# Patient Record
Sex: Female | Born: 1965
Health system: Southern US, Community
[De-identification: ages and names within clinical notes are randomized; demographics above are authoritative.]

## PROBLEM LIST (undated history)

## (undated) DIAGNOSIS — J329 Chronic sinusitis, unspecified: Secondary | ICD-10-CM

## (undated) DIAGNOSIS — R002 Palpitations: Secondary | ICD-10-CM

## (undated) DIAGNOSIS — E041 Nontoxic single thyroid nodule: Secondary | ICD-10-CM

## (undated) DIAGNOSIS — IMO0001 Reserved for inherently not codable concepts without codable children: Secondary | ICD-10-CM

## (undated) DIAGNOSIS — J302 Other seasonal allergic rhinitis: Secondary | ICD-10-CM

## (undated) DIAGNOSIS — K219 Gastro-esophageal reflux disease without esophagitis: Secondary | ICD-10-CM

## (undated) DIAGNOSIS — R519 Headache, unspecified: Secondary | ICD-10-CM

## (undated) DIAGNOSIS — E119 Type 2 diabetes mellitus without complications: Secondary | ICD-10-CM

## (undated) DIAGNOSIS — H548 Legal blindness, as defined in USA: Secondary | ICD-10-CM

## (undated) DIAGNOSIS — R51 Headache: Secondary | ICD-10-CM

## (undated) DIAGNOSIS — H5210 Myopia, unspecified eye: Secondary | ICD-10-CM

## (undated) DIAGNOSIS — M1990 Unspecified osteoarthritis, unspecified site: Secondary | ICD-10-CM

## (undated) DIAGNOSIS — J189 Pneumonia, unspecified organism: Secondary | ICD-10-CM

## (undated) DIAGNOSIS — D649 Anemia, unspecified: Secondary | ICD-10-CM

## (undated) DIAGNOSIS — Z9289 Personal history of other medical treatment: Secondary | ICD-10-CM

## (undated) DIAGNOSIS — E785 Hyperlipidemia, unspecified: Secondary | ICD-10-CM

## (undated) DIAGNOSIS — R011 Cardiac murmur, unspecified: Secondary | ICD-10-CM

## (undated) DIAGNOSIS — I10 Essential (primary) hypertension: Secondary | ICD-10-CM

## (undated) DIAGNOSIS — H409 Unspecified glaucoma: Secondary | ICD-10-CM

## (undated) DIAGNOSIS — J40 Bronchitis, not specified as acute or chronic: Secondary | ICD-10-CM

## (undated) DIAGNOSIS — Z01419 Encounter for gynecological examination (general) (routine) without abnormal findings: Secondary | ICD-10-CM

## (undated) HISTORY — PX: LASIK: SHX215

## (undated) HISTORY — DX: Personal history of other medical treatment: Z92.89

## (undated) HISTORY — DX: Myopia, unspecified eye: H52.10

## (undated) HISTORY — DX: Hyperlipidemia, unspecified: E78.5

## (undated) HISTORY — DX: Unspecified glaucoma: H40.9

## (undated) HISTORY — DX: Gastro-esophageal reflux disease without esophagitis: K21.9

## (undated) HISTORY — DX: Encounter for gynecological examination (general) (routine) without abnormal findings: Z01.419

## (undated) HISTORY — PX: INCISION AND DRAINAGE: SHX5863

## (undated) HISTORY — PX: CYSTECTOMY: SUR359

---

## 1997-11-29 ENCOUNTER — Encounter: Admission: RE | Admit: 1997-11-29 | Discharge: 1997-11-29 | Payer: Self-pay | Admitting: Family Medicine

## 1997-12-20 ENCOUNTER — Encounter: Admission: RE | Admit: 1997-12-20 | Discharge: 1997-12-20 | Payer: Self-pay | Admitting: Family Medicine

## 1998-01-20 ENCOUNTER — Encounter: Admission: RE | Admit: 1998-01-20 | Discharge: 1998-01-20 | Payer: Self-pay | Admitting: Family Medicine

## 1998-03-16 ENCOUNTER — Other Ambulatory Visit: Admission: RE | Admit: 1998-03-16 | Discharge: 1998-03-16 | Payer: Self-pay | Admitting: *Deleted

## 1998-03-16 ENCOUNTER — Encounter: Admission: RE | Admit: 1998-03-16 | Discharge: 1998-03-16 | Payer: Self-pay | Admitting: Family Medicine

## 1998-03-31 ENCOUNTER — Encounter: Admission: RE | Admit: 1998-03-31 | Discharge: 1998-03-31 | Payer: Self-pay | Admitting: Family Medicine

## 1998-04-17 ENCOUNTER — Encounter: Admission: RE | Admit: 1998-04-17 | Discharge: 1998-04-17 | Payer: Self-pay | Admitting: Family Medicine

## 1998-06-30 ENCOUNTER — Encounter: Admission: RE | Admit: 1998-06-30 | Discharge: 1998-06-30 | Payer: Self-pay | Admitting: Family Medicine

## 1998-07-26 ENCOUNTER — Encounter: Admission: RE | Admit: 1998-07-26 | Discharge: 1998-07-26 | Payer: Self-pay | Admitting: Family Medicine

## 1998-07-27 ENCOUNTER — Encounter: Admission: RE | Admit: 1998-07-27 | Discharge: 1998-07-27 | Payer: Self-pay | Admitting: Family Medicine

## 1998-08-11 ENCOUNTER — Encounter: Admission: RE | Admit: 1998-08-11 | Discharge: 1998-08-11 | Payer: Self-pay | Admitting: Family Medicine

## 1998-08-29 ENCOUNTER — Encounter: Admission: RE | Admit: 1998-08-29 | Discharge: 1998-08-29 | Payer: Self-pay | Admitting: Family Medicine

## 1999-03-02 ENCOUNTER — Encounter: Admission: RE | Admit: 1999-03-02 | Discharge: 1999-03-02 | Payer: Self-pay | Admitting: Family Medicine

## 1999-03-08 ENCOUNTER — Encounter: Admission: RE | Admit: 1999-03-08 | Discharge: 1999-03-08 | Payer: Self-pay | Admitting: Family Medicine

## 1999-03-13 ENCOUNTER — Encounter: Admission: RE | Admit: 1999-03-13 | Discharge: 1999-03-13 | Payer: Self-pay | Admitting: Sports Medicine

## 1999-03-20 ENCOUNTER — Encounter: Admission: RE | Admit: 1999-03-20 | Discharge: 1999-03-20 | Payer: Self-pay | Admitting: Family Medicine

## 1999-05-04 ENCOUNTER — Encounter: Admission: RE | Admit: 1999-05-04 | Discharge: 1999-05-04 | Payer: Self-pay | Admitting: Family Medicine

## 1999-06-11 ENCOUNTER — Encounter: Admission: RE | Admit: 1999-06-11 | Discharge: 1999-06-11 | Payer: Self-pay | Admitting: Family Medicine

## 1999-07-31 ENCOUNTER — Other Ambulatory Visit: Admission: RE | Admit: 1999-07-31 | Discharge: 1999-07-31 | Payer: Self-pay | Admitting: *Deleted

## 1999-07-31 ENCOUNTER — Encounter: Admission: RE | Admit: 1999-07-31 | Discharge: 1999-07-31 | Payer: Self-pay | Admitting: Family Medicine

## 1999-08-01 ENCOUNTER — Encounter: Admission: RE | Admit: 1999-08-01 | Discharge: 1999-08-01 | Payer: Self-pay | Admitting: Family Medicine

## 1999-10-25 ENCOUNTER — Encounter: Admission: RE | Admit: 1999-10-25 | Discharge: 1999-10-25 | Payer: Self-pay | Admitting: Family Medicine

## 1999-11-13 ENCOUNTER — Encounter: Admission: RE | Admit: 1999-11-13 | Discharge: 1999-11-13 | Payer: Self-pay | Admitting: Sports Medicine

## 1999-11-30 ENCOUNTER — Encounter: Admission: RE | Admit: 1999-11-30 | Discharge: 1999-11-30 | Payer: Self-pay | Admitting: Family Medicine

## 2000-02-28 ENCOUNTER — Encounter: Admission: RE | Admit: 2000-02-28 | Discharge: 2000-02-28 | Payer: Self-pay | Admitting: Family Medicine

## 2000-11-19 ENCOUNTER — Encounter: Admission: RE | Admit: 2000-11-19 | Discharge: 2000-11-19 | Payer: Self-pay | Admitting: Family Medicine

## 2001-01-16 ENCOUNTER — Encounter: Admission: RE | Admit: 2001-01-16 | Discharge: 2001-01-16 | Payer: Self-pay | Admitting: Family Medicine

## 2001-01-21 ENCOUNTER — Encounter: Admission: RE | Admit: 2001-01-21 | Discharge: 2001-01-21 | Payer: Self-pay | Admitting: Family Medicine

## 2001-01-27 ENCOUNTER — Encounter: Admission: RE | Admit: 2001-01-27 | Discharge: 2001-04-27 | Payer: Self-pay

## 2001-02-23 ENCOUNTER — Encounter: Admission: RE | Admit: 2001-02-23 | Discharge: 2001-02-23 | Payer: Self-pay | Admitting: Family Medicine

## 2001-04-22 ENCOUNTER — Encounter: Admission: RE | Admit: 2001-04-22 | Discharge: 2001-04-22 | Payer: Self-pay | Admitting: Family Medicine

## 2001-05-01 ENCOUNTER — Encounter: Admission: RE | Admit: 2001-05-01 | Discharge: 2001-05-01 | Payer: Self-pay | Admitting: Family Medicine

## 2001-05-28 ENCOUNTER — Encounter: Admission: RE | Admit: 2001-05-28 | Discharge: 2001-08-26 | Payer: Self-pay | Admitting: Family Medicine

## 2001-06-01 ENCOUNTER — Other Ambulatory Visit: Admission: RE | Admit: 2001-06-01 | Discharge: 2001-06-01 | Payer: Self-pay | Admitting: Family Medicine

## 2001-06-01 ENCOUNTER — Encounter: Admission: RE | Admit: 2001-06-01 | Discharge: 2001-06-01 | Payer: Self-pay | Admitting: Family Medicine

## 2001-06-16 ENCOUNTER — Encounter: Admission: RE | Admit: 2001-06-16 | Discharge: 2001-06-16 | Payer: Self-pay | Admitting: Family Medicine

## 2002-01-25 ENCOUNTER — Encounter: Admission: RE | Admit: 2002-01-25 | Discharge: 2002-01-25 | Payer: Self-pay | Admitting: Family Medicine

## 2002-05-11 ENCOUNTER — Encounter: Admission: RE | Admit: 2002-05-11 | Discharge: 2002-05-11 | Payer: Self-pay | Admitting: Family Medicine

## 2002-11-09 ENCOUNTER — Encounter: Admission: RE | Admit: 2002-11-09 | Discharge: 2002-11-09 | Payer: Self-pay | Admitting: Family Medicine

## 2003-04-19 ENCOUNTER — Encounter: Admission: RE | Admit: 2003-04-19 | Discharge: 2003-04-19 | Payer: Self-pay | Admitting: Sports Medicine

## 2003-05-21 DIAGNOSIS — Z9289 Personal history of other medical treatment: Secondary | ICD-10-CM

## 2003-05-21 HISTORY — DX: Personal history of other medical treatment: Z92.89

## 2003-06-29 ENCOUNTER — Encounter: Admission: RE | Admit: 2003-06-29 | Discharge: 2003-06-29 | Payer: Self-pay | Admitting: Family Medicine

## 2003-09-23 ENCOUNTER — Encounter: Admission: RE | Admit: 2003-09-23 | Discharge: 2003-09-23 | Payer: Self-pay | Admitting: Family Medicine

## 2003-10-27 ENCOUNTER — Encounter: Admission: RE | Admit: 2003-10-27 | Discharge: 2003-10-27 | Payer: Self-pay | Admitting: Family Medicine

## 2003-10-27 ENCOUNTER — Other Ambulatory Visit: Admission: RE | Admit: 2003-10-27 | Discharge: 2003-10-27 | Payer: Self-pay | Admitting: Family Medicine

## 2003-10-31 ENCOUNTER — Encounter: Admission: RE | Admit: 2003-10-31 | Discharge: 2003-10-31 | Payer: Self-pay | Admitting: Sports Medicine

## 2004-03-08 ENCOUNTER — Ambulatory Visit: Payer: Self-pay | Admitting: Family Medicine

## 2004-10-11 ENCOUNTER — Ambulatory Visit: Payer: Self-pay | Admitting: Family Medicine

## 2004-10-16 ENCOUNTER — Ambulatory Visit: Payer: Self-pay | Admitting: Family Medicine

## 2004-11-01 ENCOUNTER — Ambulatory Visit: Payer: Self-pay | Admitting: Family Medicine

## 2005-01-18 ENCOUNTER — Encounter (INDEPENDENT_AMBULATORY_CARE_PROVIDER_SITE_OTHER): Payer: Self-pay | Admitting: *Deleted

## 2005-01-18 LAB — CONVERTED CEMR LAB

## 2005-02-07 ENCOUNTER — Other Ambulatory Visit: Admission: RE | Admit: 2005-02-07 | Discharge: 2005-02-07 | Payer: Self-pay | Admitting: Family Medicine

## 2005-02-07 ENCOUNTER — Ambulatory Visit: Payer: Self-pay | Admitting: Family Medicine

## 2005-04-23 ENCOUNTER — Ambulatory Visit: Payer: Self-pay | Admitting: Sports Medicine

## 2005-04-29 ENCOUNTER — Ambulatory Visit: Payer: Self-pay | Admitting: Family Medicine

## 2006-04-13 ENCOUNTER — Emergency Department (HOSPITAL_COMMUNITY): Admission: EM | Admit: 2006-04-13 | Discharge: 2006-04-13 | Payer: Self-pay | Admitting: Family Medicine

## 2006-07-07 ENCOUNTER — Ambulatory Visit: Payer: Self-pay | Admitting: Family Medicine

## 2006-07-07 ENCOUNTER — Encounter (INDEPENDENT_AMBULATORY_CARE_PROVIDER_SITE_OTHER): Payer: Self-pay | Admitting: Family Medicine

## 2006-07-07 LAB — CONVERTED CEMR LAB
Chlamydia, DNA Probe: NEGATIVE
GC Probe Amp, Genital: NEGATIVE

## 2006-07-17 DIAGNOSIS — K2100 Gastro-esophageal reflux disease with esophagitis, without bleeding: Secondary | ICD-10-CM | POA: Insufficient documentation

## 2006-07-17 DIAGNOSIS — E7800 Pure hypercholesterolemia, unspecified: Secondary | ICD-10-CM | POA: Insufficient documentation

## 2006-07-17 DIAGNOSIS — H409 Unspecified glaucoma: Secondary | ICD-10-CM | POA: Insufficient documentation

## 2006-07-17 DIAGNOSIS — I10 Essential (primary) hypertension: Secondary | ICD-10-CM | POA: Insufficient documentation

## 2006-07-17 DIAGNOSIS — E118 Type 2 diabetes mellitus with unspecified complications: Secondary | ICD-10-CM

## 2006-07-17 DIAGNOSIS — H539 Unspecified visual disturbance: Secondary | ICD-10-CM | POA: Insufficient documentation

## 2006-07-17 DIAGNOSIS — E1165 Type 2 diabetes mellitus with hyperglycemia: Secondary | ICD-10-CM | POA: Insufficient documentation

## 2006-07-18 ENCOUNTER — Encounter (INDEPENDENT_AMBULATORY_CARE_PROVIDER_SITE_OTHER): Payer: Self-pay | Admitting: *Deleted

## 2006-07-20 ENCOUNTER — Emergency Department (HOSPITAL_COMMUNITY): Admission: EM | Admit: 2006-07-20 | Discharge: 2006-07-20 | Payer: Self-pay | Admitting: Family Medicine

## 2006-08-21 ENCOUNTER — Emergency Department (HOSPITAL_COMMUNITY): Admission: EM | Admit: 2006-08-21 | Discharge: 2006-08-21 | Payer: Self-pay | Admitting: Family Medicine

## 2006-08-26 ENCOUNTER — Telehealth: Payer: Self-pay | Admitting: *Deleted

## 2006-08-28 ENCOUNTER — Encounter (INDEPENDENT_AMBULATORY_CARE_PROVIDER_SITE_OTHER): Payer: Self-pay | Admitting: Family Medicine

## 2006-08-28 ENCOUNTER — Ambulatory Visit: Payer: Self-pay | Admitting: Family Medicine

## 2006-08-28 ENCOUNTER — Encounter: Payer: Self-pay | Admitting: *Deleted

## 2006-09-02 ENCOUNTER — Telehealth: Payer: Self-pay | Admitting: *Deleted

## 2006-09-03 ENCOUNTER — Encounter: Payer: Self-pay | Admitting: Family Medicine

## 2006-09-08 ENCOUNTER — Emergency Department (HOSPITAL_COMMUNITY): Admission: EM | Admit: 2006-09-08 | Discharge: 2006-09-08 | Payer: Self-pay | Admitting: Family Medicine

## 2006-09-09 ENCOUNTER — Telehealth: Payer: Self-pay | Admitting: *Deleted

## 2006-09-10 ENCOUNTER — Telehealth: Payer: Self-pay | Admitting: *Deleted

## 2006-10-20 ENCOUNTER — Encounter: Payer: Self-pay | Admitting: Family Medicine

## 2007-01-02 ENCOUNTER — Encounter: Payer: Self-pay | Admitting: Family Medicine

## 2007-02-24 ENCOUNTER — Telehealth (INDEPENDENT_AMBULATORY_CARE_PROVIDER_SITE_OTHER): Payer: Self-pay | Admitting: *Deleted

## 2007-03-11 ENCOUNTER — Encounter: Payer: Self-pay | Admitting: Family Medicine

## 2007-03-11 ENCOUNTER — Ambulatory Visit: Payer: Self-pay | Admitting: Family Medicine

## 2007-03-11 LAB — CONVERTED CEMR LAB
ALT: 11 units/L (ref 0–35)
AST: 12 units/L (ref 0–37)
Albumin: 4.2 g/dL (ref 3.5–5.2)
Alkaline Phosphatase: 47 units/L (ref 39–117)
BUN: 10 mg/dL (ref 6–23)
CO2: 23 meq/L (ref 19–32)
Calcium: 9.3 mg/dL (ref 8.4–10.5)
Chloride: 106 meq/L (ref 96–112)
Cholesterol: 173 mg/dL (ref 0–200)
Creatinine, Ser: 0.59 mg/dL (ref 0.40–1.20)
Glucose, Bld: 126 mg/dL — ABNORMAL HIGH (ref 70–99)
HDL: 43 mg/dL (ref >39)
Hgb A1c MFr Bld: 7.3 %
LDL Cholesterol: 112 mg/dL — ABNORMAL HIGH (ref 0–99)
Potassium: 4.6 meq/L (ref 3.5–5.3)
Sodium: 140 meq/L (ref 135–145)
Total Bilirubin: 0.5 mg/dL (ref 0.3–1.2)
Total CHOL/HDL Ratio: 4
Total Protein: 6.7 g/dL (ref 6.0–8.3)
Triglycerides: 92 mg/dL (ref <150)
VLDL: 18 mg/dL (ref 0–40)

## 2007-12-01 ENCOUNTER — Telehealth: Payer: Self-pay | Admitting: Family Medicine

## 2007-12-06 ENCOUNTER — Emergency Department (HOSPITAL_COMMUNITY): Admission: EM | Admit: 2007-12-06 | Discharge: 2007-12-06 | Payer: Self-pay | Admitting: Family Medicine

## 2007-12-07 ENCOUNTER — Telehealth: Payer: Self-pay | Admitting: *Deleted

## 2007-12-14 ENCOUNTER — Encounter (INDEPENDENT_AMBULATORY_CARE_PROVIDER_SITE_OTHER): Payer: Self-pay | Admitting: *Deleted

## 2008-01-06 ENCOUNTER — Ambulatory Visit: Payer: Self-pay | Admitting: Family Medicine

## 2008-01-06 LAB — CONVERTED CEMR LAB: Hgb A1c MFr Bld: 7.2 %

## 2008-02-13 ENCOUNTER — Telehealth (INDEPENDENT_AMBULATORY_CARE_PROVIDER_SITE_OTHER): Payer: Self-pay | Admitting: Family Medicine

## 2008-02-16 ENCOUNTER — Encounter (INDEPENDENT_AMBULATORY_CARE_PROVIDER_SITE_OTHER): Payer: Self-pay | Admitting: *Deleted

## 2008-04-09 ENCOUNTER — Telehealth: Payer: Self-pay | Admitting: Family Medicine

## 2008-04-10 ENCOUNTER — Emergency Department (HOSPITAL_COMMUNITY): Admission: EM | Admit: 2008-04-10 | Discharge: 2008-04-10 | Payer: Self-pay | Admitting: Family Medicine

## 2008-08-02 ENCOUNTER — Telehealth: Payer: Self-pay | Admitting: Family Medicine

## 2008-08-03 ENCOUNTER — Ambulatory Visit: Payer: Self-pay | Admitting: Family Medicine

## 2008-10-04 ENCOUNTER — Encounter (INDEPENDENT_AMBULATORY_CARE_PROVIDER_SITE_OTHER): Payer: Self-pay | Admitting: *Deleted

## 2008-10-12 ENCOUNTER — Telehealth: Payer: Self-pay | Admitting: *Deleted

## 2008-10-19 ENCOUNTER — Ambulatory Visit: Payer: Self-pay | Admitting: Family Medicine

## 2008-10-19 ENCOUNTER — Encounter: Payer: Self-pay | Admitting: Family Medicine

## 2008-10-19 DIAGNOSIS — L989 Disorder of the skin and subcutaneous tissue, unspecified: Secondary | ICD-10-CM | POA: Insufficient documentation

## 2008-10-19 DIAGNOSIS — R011 Cardiac murmur, unspecified: Secondary | ICD-10-CM | POA: Insufficient documentation

## 2008-10-19 LAB — CONVERTED CEMR LAB
ALT: 13 units/L (ref 0–35)
AST: 15 units/L (ref 0–37)
Albumin: 4.7 g/dL (ref 3.5–5.2)
Alkaline Phosphatase: 56 units/L (ref 39–117)
BUN: 14 mg/dL (ref 6–23)
CO2: 25 meq/L (ref 19–32)
Calcium: 9.9 mg/dL (ref 8.4–10.5)
Chloride: 101 meq/L (ref 96–112)
Cholesterol: 183 mg/dL (ref 0–200)
Creatinine, Ser: 0.66 mg/dL (ref 0.40–1.20)
Glucose, Bld: 178 mg/dL — ABNORMAL HIGH (ref 70–99)
HCT: 35.4 % — ABNORMAL LOW (ref 36.0–46.0)
HDL: 41 mg/dL (ref >39)
Hemoglobin: 12.2 g/dL (ref 12.0–15.0)
LDL Cholesterol: 121 mg/dL — ABNORMAL HIGH (ref 0–99)
MCHC: 34.5 g/dL (ref 30.0–36.0)
MCV: 86.1 fL (ref 78.0–100.0)
Platelets: 319 10*3/uL (ref 150–400)
Potassium: 3.9 meq/L (ref 3.5–5.3)
RBC: 4.11 M/uL (ref 3.87–5.11)
RDW: 12.6 % (ref 11.5–15.5)
Sodium: 138 meq/L (ref 135–145)
Total Bilirubin: 0.8 mg/dL (ref 0.3–1.2)
Total CHOL/HDL Ratio: 4.5
Total Protein: 7.6 g/dL (ref 6.0–8.3)
Triglycerides: 104 mg/dL (ref <150)
VLDL: 21 mg/dL (ref 0–40)
WBC: 7.6 10*3/uL (ref 4.0–10.5)

## 2008-10-20 ENCOUNTER — Telehealth: Payer: Self-pay | Admitting: Family Medicine

## 2008-10-20 ENCOUNTER — Encounter: Payer: Self-pay | Admitting: Family Medicine

## 2008-10-24 ENCOUNTER — Telehealth: Payer: Self-pay | Admitting: Family Medicine

## 2009-03-08 ENCOUNTER — Encounter: Payer: Self-pay | Admitting: Family Medicine

## 2009-06-19 ENCOUNTER — Telehealth: Payer: Self-pay | Admitting: Family Medicine

## 2009-11-15 ENCOUNTER — Ambulatory Visit: Payer: Self-pay | Admitting: Family Medicine

## 2009-11-15 LAB — CONVERTED CEMR LAB: Hgb A1c MFr Bld: 7.3 %

## 2010-01-11 ENCOUNTER — Telehealth: Payer: Self-pay | Admitting: Family Medicine

## 2010-03-12 ENCOUNTER — Encounter: Payer: Self-pay | Admitting: *Deleted

## 2010-03-15 ENCOUNTER — Telehealth: Payer: Self-pay | Admitting: Family Medicine

## 2010-03-16 ENCOUNTER — Ambulatory Visit: Payer: Self-pay | Admitting: Family Medicine

## 2010-03-16 ENCOUNTER — Encounter: Payer: Self-pay | Admitting: Family Medicine

## 2010-03-16 LAB — CONVERTED CEMR LAB
ALT: 16 units/L (ref 0–35)
AST: 16 units/L (ref 0–37)
Albumin: 4.7 g/dL (ref 3.5–5.2)
Alkaline Phosphatase: 61 units/L (ref 39–117)
BUN: 12 mg/dL (ref 6–23)
CO2: 24 meq/L (ref 19–32)
Calcium: 9.8 mg/dL (ref 8.4–10.5)
Chloride: 102 meq/L (ref 96–112)
Creatinine, Ser: 0.62 mg/dL (ref 0.40–1.20)
Glucose, Bld: 98 mg/dL (ref 70–99)
Potassium: 4.1 meq/L (ref 3.5–5.3)
Sodium: 139 meq/L (ref 135–145)
Total Bilirubin: 0.9 mg/dL (ref 0.3–1.2)
Total Protein: 7 g/dL (ref 6.0–8.3)

## 2010-04-04 ENCOUNTER — Telehealth: Payer: Self-pay | Admitting: Family Medicine

## 2010-05-03 ENCOUNTER — Telehealth (INDEPENDENT_AMBULATORY_CARE_PROVIDER_SITE_OTHER): Payer: Self-pay | Admitting: *Deleted

## 2010-05-03 ENCOUNTER — Emergency Department (HOSPITAL_COMMUNITY)
Admission: EM | Admit: 2010-05-03 | Discharge: 2010-05-03 | Payer: Self-pay | Source: Home / Self Care | Admitting: Family Medicine

## 2010-06-19 ENCOUNTER — Telehealth: Payer: Self-pay | Admitting: *Deleted

## 2010-06-19 NOTE — Progress Notes (Signed)
  Phone Note Outgoing Call   Call placed by: Carin Hock MD,  October 20, 2008 7:23 PM Summary of Call: Called pt to discuss lab results.  A1C 7.4 and LDL 121.  For DM, will start glyburide metformin combo in place of her metformin.  Will also start her on simvastatin.  she actually still has a bottle of unexpired simva 40 that Dr Birdie Riddle prescribed her, but she never took.  she has 3 refills through 8/10.  She also needs lancets and test strips for accucheck avia meter.  will leave her a script up front for that on Monday. Initial call taken by: Carin Hock MD,  October 20, 2008 7:26 PM      Appended Document:  correction--A1C was 7.6

## 2010-06-19 NOTE — Progress Notes (Signed)
Summary: Rx Req  Phone Note Refill Request Call back at Home Phone [phone] Message from:  Patient  Refills Requested: Medication #1:  GLYBURIDE-METFORMIN 2.5-500 MG TABS 1 tab by mouth bid   Brand Name Necessary? No   Supply Requested: 1 month  Medication #2:  LISINOPRIL-HYDROCHLOROTHIAZIDE 20-25 MG TABS 1 tablet by mouth daily - in place of Hyzaar   Brand Name Necessary? No   Supply Requested: 1 month PT NEEDS JUST ENOUGH TILL SHE COMES IN ON THE 14TH OF SEPT.  PT HAS BEEN WAITING TO COME IN DUE TO NOT HAVING INS. CURRENTLY TRYING TO GET IT SO IT WILL BE IN PLACE FOR WHEN SHE COMES IN.   Initial call taken by: Raymond Gurney,  January 11, 2010 4:20 PM  Follow-up for Phone Call        [facility] [address] for 1 month supply on both medications, Ignore first two refills, tried to print for pt. to pick up but then [facility] [address] Follow-up by: Luetta Nutting DO,  January 11, 2010 5:04 PM    Prescriptions: GLYBURIDE-METFORMIN 2.5-500 MG TABS (GLYBURIDE-METFORMIN) 1 tab by mouth bid  #60 Tablet x 0   Entered and Authorized by:   Luetta Nutting DO   Signed by:   Luetta Nutting DO on 01/11/2010   Method used:   Electronically to        [facility] [phone]* (retail)       [place], [place]  [id number]       Ph: [phone] or [phone]       Fax: [phone]   RxID:   [id number] LISINOPRIL-HYDROCHLOROTHIAZIDE 20-25 MG TABS (LISINOPRIL-HYDROCHLOROTHIAZIDE) 1 tablet by mouth daily - in place of Hyzaar  #30 Tablet x 0   Entered and Authorized by:   Luetta Nutting DO   Signed by:   Luetta Nutting DO on 01/11/2010   Method used:   Electronically to        [facility] [phone]* (retail)       [place], [place]  [id number]       Ph: [phone] or [phone]       Fax: [phone]   RxID:    [id number] GLYBURIDE-METFORMIN 2.5-500 MG TABS (GLYBURIDE-METFORMIN) 1 tab by mouth bid  #60 Tablet x 0   Entered and Authorized by:   Luetta Nutting DO   Signed by:   Luetta Nutting DO on 01/11/2010   Method used:   Print then Give to Patient   RxID:   [id number] LISINOPRIL-HYDROCHLOROTHIAZIDE 20-25 MG TABS (LISINOPRIL-HYDROCHLOROTHIAZIDE) 1 tablet by mouth daily - in place of Hyzaar  #30 Tablet x 0   Entered and Authorized by:   Luetta Nutting DO   Signed by:   Luetta Nutting DO on 01/11/2010   Method used:   Print then Give to Patient   RxID:   [id number] LISINOPRIL-HYDROCHLOROTHIAZIDE 20-25 MG TABS (LISINOPRIL-HYDROCHLOROTHIAZIDE) 1 tablet by mouth daily - in place of Hyzaar  #30 Tablet x 0   Entered and Authorized by:   Luetta Nutting DO   Signed by:   Luetta Nutting DO on 01/11/2010   Method used:   Print then Give to Patient   RxID:   [id number] GLYBURIDE-METFORMIN 2.5-500 MG TABS (  GLYBURIDE-METFORMIN) 1 tab by mouth bid  #60 Tablet x 0   Entered and Authorized by:   Luetta Nutting DO   Signed by:   Luetta Nutting DO on 01/11/2010   Method used:   Print then Give to Patient   RxID:   [id number]   Appended Document: Rx Req called pt lmom to let her know that her meds are at the pharmacy ready for pick up.

## 2010-06-19 NOTE — Progress Notes (Signed)
Summary: results  Phone Note Call from Patient Call back at Home Phone [phone]   Caller: Patient Summary of Call: would like to know results of test/lab Initial call taken by: Audie Clear,  April 04, 2010 11:37 AM  Follow-up for Phone Call        Phone Call Completed, gave pt. results of labs.  Follow-up by: Luetta Nutting DO,  April 11, 2010 1:37 PM

## 2010-06-19 NOTE — Progress Notes (Signed)
Summary: Refill  Phone Note Outgoing Call   Call placed by: Luetta Nutting DO,  March 15, 2010 4:50 PM Call placed to: Patient Action Taken: Phone Call Completed Details for Reason: Refill Request Summary of Call: [facility].  Patient has not been seen for her diabetes in >1 year.  Need to see patient before re-prescribing.  Called her and left message to let her know that will not refill until seen in clinic.  Has appointment with Tereasa Coop tomorrow (10/28)  to have medicines refilled

## 2010-06-19 NOTE — Progress Notes (Signed)
Summary: traige  Phone Note Call from Patient   Caller: Patient Summary of Call: pt is having shoulder pain Initial call taken by: Audie Clear,  August 02, 2008 3:53 PM  Follow-up for Phone Call        hurt shoulder. could not sleep last night.  states she was off today & wanted to go to urgent care. told her we always have same day appts & if she can call early in day we can usually get her in that day. asked if she felt she needed to be seen today or could see md here tomorrow. she said she was ok waiting. advised tylenol or ibuprofen & rest. she agreed with plan Follow-up by: Elige Radon RN,  August 02, 2008 3:55 PM

## 2010-06-19 NOTE — Consult Note (Signed)
Summary: [facility]   Imported By: Drucie Ip 09/10/2006 11:21:54  _____________________________________________________________________  External Attachment:    Type:   Image     Comment:   External Document

## 2010-06-19 NOTE — Assessment & Plan Note (Signed)
Summary: needs  refill on meds/ls   Vital Signs:  Patient profile:   45 year old female Height:      61 inches Weight:      162 pounds BMI:     30.72 Pulse rate:   94 / minute BP sitting:   160 / 90  (right arm)  Vitals Entered By: Mauricia Area CMA, (March 16, 2010 1:37 PM) CC: refill meds. f/up HTN and DM. has not been taking meds x several weeks. Is Patient Diabetic? Yes Pain Assessment Patient in pain? no        Primary Care Provider:  Orland Mustard  MD  CC:  refill meds. f/up HTN and DM. has not been taking meds x several weeks.[name]  History of Present Illness: Has not been here as she no longer has health insurance.  She had Medicaid but when she went back to work she lost it, she will now need to wait util June to enroll for some reason.    She has not been taking her statin, she reports never taking for any lenght of time.  She has been on two combination meds, [facility]  We discussed breaking it up as separately they are on the $4 list.    She has been out of her BP meds for one week, has a few diabetic meds left.  Habits & Providers  Alcohol-Tobacco-Diet     Tobacco Status: never  Current Medications (verified): 1)  Bayer Childrens Aspirin 81 Mg Chew (Aspirin) .... Take 1 Tablet By Mouth Once A Day 2)  Lisinopril-Hydrochlorothiazide 20-25 Mg Tabs (Lisinopril-Hydrochlorothiazide) .[name].. 1 Tablet By Mouth Daily 3)  Prodigy Blood Glucose Monitor  Devi (Blood Glucose Monitoring Suppl) .... Use To Check Blood Sugar As Directed 4)  Prodigy Blood Glucose Test  Strp (Glucose Blood) .... Use To Check Blood Sugar As Directed Dispense: 1 Box 5)  Prodigy Twist Top Lancets 28g  Misc (Lancets) .... Check Sugars As Directed 6)  Glyburide 2.5 Mg Tabs (Glyburide) .... One Two Times A Day 7)  Metformin Hcl 500 Mg Tabs (Metformin Hcl) .... One Two Times A Day  Allergies: No Known Drug Allergies  Review of Systems General:  Denies malaise. CV:   Denies chest pain or discomfort and swelling of feet. Resp:  Denies cough and shortness of breath. GI:  Denies abdominal pain and constipation. GU:  Denies discharge and dysuria. MS:  Denies joint pain.  Physical Exam  General:  Alert, in a hurry.  Very thick lens on glasses. Lungs:  normal respiratory effort and normal breath sounds.   Heart:  normal rate and regular rhythm.    Diabetes Management Exam:    Foot Exam (with socks and/or shoes not present):       Sensory-Pinprick/Light touch:          Left medial foot (L-4): normal          Left dorsal foot (L-5): normal          Left lateral foot (S-1): normal          Right medial foot (L-4): normal          Right dorsal foot (L-5): normal          Right lateral foot (S-1): normal       Sensory-Monofilament:          Left foot: normal          Right foot: normal  Inspection:          Left foot: normal          Right foot: normal       Nails:          Left foot: thickened          Right foot: thickened   Impression & Recommendations:  Problem # 1:  HYPERTENSION, BENIGN SYSTEMIC (ICD-401.1)  Her updated medication list for this problem includes:    Lisinopril-hydrochlorothiazide 20-25 Mg Tabs (Lisinopril-hydrochlorothiazide) .[name]... 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 1 tab by mouth bid Her updated medication list for this problem includes:    Bayer Childrens Aspirin 81 Mg Chew (Aspirin) .[name]... Take 1 tablet by mouth once a day    Lisinopril-hydrochlorothiazide 20-25 Mg Tabs (Lisinopril-hydrochlorothiazide) .[name]... 1 tablet by mouth daily    Glyburide 2.5 Mg Tabs (Glyburide) ..... One two times a day    Metformin Hcl 500 Mg Tabs (Metformin hcl) ..... One two times a day  Orders: A1C-[facility]  [id number]) Comp Met-[facility] [phone]) [place]- Est Level  3 [id number])  Complete Medication List: 1)  Bayer Childrens Aspirin 81 Mg Chew (Aspirin) .... Take 1 tablet by mouth once a day 2)  Lisinopril-hydrochlorothiazide 20-25 Mg Tabs (Lisinopril-hydrochlorothiazide) .[name].. 1 tablet by mouth daily 3)  Prodigy Blood Glucose Monitor Devi (Blood glucose monitoring suppl) .... Use to check blood sugar as directed 4)  Prodigy Blood Glucose Test Strp (Glucose blood) .... Use to check blood sugar as directed dispense: 1 box 5)  Prodigy Twist Top Lancets 28g Misc (Lancets) .... Check sugars as directed 6)  Glyburide 2.5 Mg Tabs (Glyburide) .... One two times a day 7)  Metformin Hcl 500 Mg Tabs (Metformin hcl) .... One two times a day  Patient Instructions: 1)  Return when  your insurance is active 2)  Contact [facility] to help with bills Prescriptions: METFORMIN HCL 500 MG TABS (METFORMIN HCL) one two times a day Brand medically necessary #60 x 5   Entered and Authorized by:   Tereasa Coop NP   Signed by:   Tereasa Coop NP on 03/16/2010   Method used:   Print then Give to Patient   RxID:   [id number] GLYBURIDE 2.5 MG TABS (GLYBURIDE) one two times a day Brand medically necessary #60 x 5   Entered and Authorized by:   Tereasa Coop NP   Signed by:   Tereasa Coop NP on 03/16/2010   Method used:   Print then Give to Patient   RxID:   [id number] LISINOPRIL-HYDROCHLOROTHIAZIDE 20-25 MG TABS (LISINOPRIL-HYDROCHLOROTHIAZIDE) 1 tablet by mouth daily Brand medically necessary #30 x 5   Entered and Authorized by:   Tereasa Coop NP   Signed by:   Tereasa Coop NP on 03/16/2010   Method used:   Print then Give to Patient   RxID:   [id number]    Orders Added: 1)  A1C-[facility] [83036] 2)  Comp Met-[facility] [id number] 3)  [place]- Est Level  3 [id number]      Prevention & Chronic Care Immunizations   Influenza vaccine: Not documented   Influenza vaccine deferral: Refused  (03/16/2010)    Tetanus  booster: 02/17/1998: Done.   Tetanus booster due: 02/18/2008    Pneumococcal vaccine: Not documented  Other Screening   Pap smear: Done.  (  01/18/2005)   Pap smear due: 01/18/2006    Mammogram: Done.  (10/19/2003)   Mammogram due: 10/18/2004   Smoking status: never  (03/16/2010)  Diabetes Mellitus   HgbA1C: 7.3  (11/15/2009)   Hemoglobin A1C due: 06/11/2007    Eye exam: Not documented    Foot exam: yes  (03/16/2010)   Foot exam action/deferral: Do today   High risk foot: Not documented   Foot care education: Not documented   Foot exam due: 03/10/2008    Urine microalbumin/creatinine ratio: Not documented  Lipids   Total Cholesterol: 183  (10/19/2008)   LDL: 121  (10/19/2008)   LDL Direct: Not documented   HDL: 41  (10/19/2008)   Triglycerides: 104  (10/19/2008)    SGOT (AST): 15  (10/19/2008)   SGPT (ALT): 13  (10/19/2008) CMP ordered    Alkaline phosphatase: 56  (10/19/2008)   Total bilirubin: 0.8  (10/19/2008)  Hypertension   Last Blood Pressure: 160 / 90  (03/16/2010)   Serum creatinine: 0.66  (10/19/2008)   Serum potassium 3.9  (10/19/2008) CMP ordered     Hypertension flowsheet reviewed?: Yes   Progress toward BP goal: Deteriorated   Hypertension comments: ran out of meds  Self-Management Support :    Diabetes self-management support: Not documented    Hypertension self-management support: Not documented    Lipid self-management support: Not documented    Nursing Instructions: Diabetic foot exam today   Appended Document: A1c  7.6 %    Lab Visit  Laboratory Results   Blood Tests   Date/Time Received: March 16, 2010 1:52 PM  Date/Time Reported: March 16, 2010 2:23 PM   HGBA1C: 7.6%   (Normal Range: Non-Diabetic - 3-6%   Control Diabetic - 6-8%)  Comments: ...............test performed by......Marland KitchenBonnie A. Martinique, MLS (ASCP)cm    Orders Today:

## 2010-06-19 NOTE — Progress Notes (Signed)
Summary: PT NEEDS OV/PLEASE TELL PT/SEE NOTE/TS  Phone Note Call from Patient Call back at [phone]   Caller: Patient Summary of Call: pt needs a referral to eye dr Para Skeans OD - was told that she can't been seen for 2 yrs. and she needs to be seen every year b/c of her eye condition (she is leaglly blind) she was there last year in July and feels she needs to come in yearly.  cell [phone] Initial call taken by: Audie Clear,  Oct 12, 2008 2:33 PM  Follow-up for Phone Call        Will forward to MD. Follow-up by: Janeth Rase LPN,  May 26, 624THL [id number] PM  Additional Follow-up for Phone Call Additional follow up Details #1::        I have never met patient.  Is due for diabetes/HTN/ cholesterol check.  Not familiar enough with situation to override eye doctor's instructions.  She [facility] appt with Dr. Oneal Grout yesterday.  Needs to come in for appointment before referral considered. Additional Follow-up by: Elige Radon, Oct 12, 2008    Additional Follow-up for Phone Call Additional follow up Details #2::    tried to call pt at all 3 numbers. unable to reach. need to tell pt to have ov .. see dr.mayans note Follow-up by: Mauricia Area CMA,,  Oct 13, 2008 10:34 AM    Appended Document: PT NEEDS OV/PLEASE TELL PT/SEE NOTE/TS pt called back and appt made for 6/2

## 2010-06-19 NOTE — Progress Notes (Signed)
  Phone Note Call from Patient   Summary of Call: Pt reports dry hacking cough with itchy throat. Works with the public and must work tomorrow, wants to know what to do. I advised honey, warm liquids and throat lozenges. Also advised benadryl for itchy throat. Advised that if pt begins to have trouble breathing she should seek medical attention immediately. Pt expresses agreement and understanding  Initial call taken by: Eugenie Norrie  MD,  April 09, 2008 7:59 PM

## 2010-06-19 NOTE — Progress Notes (Signed)
Summary: [place] request  Phone Note Call from Patient Call back at [phone]   Reason for Call: Talk to Nurse Summary of Call: pt needs to be seen for bump that is very large and has ruptured Initial call taken by: Drucie Ip,  September 02, 2006 8:49 AM  Follow-up for Phone Call        on antibiotics for abd abcess. it burst last night. afebrile, draining pus. has f/u in am w/MD. to keep clean & dry, may use warm compress to area & tyl if discomfort. keep am appt Follow-up by: Elige Radon RN,  September 02, 2006 8:59 AM    Appended Document: [place] request Pt called.  Will get appt at [facility] for eval and management (of cyst + for E.coli) for 4/16.  Pt does not need to be seen at [facility].  /VE-C.

## 2010-06-19 NOTE — Progress Notes (Signed)
Summary: triage/medication  Phone Note Call from Patient Call back at [phone]   Reason for Call: Talk to Nurse Summary of Call: pt sts she was tested positive for the flu & now she is itching, she wants to know if there is anything she can take for that? Initial call taken by: ERIN LEVAN,  September 09, 2006 10:59 AM  Follow-up for Phone Call        states urgent care told her she was probably the last person this season to be diagnosed with the flu. had fever 103. Now is itching all over. Told her to try benadryl-no driving. to let us know if this does not help Follow-up by: Elige Radon RN,  September 09, 2006 11:08 AM

## 2010-06-19 NOTE — Assessment & Plan Note (Signed)
Summary: DNKA,AG            Complete Medication List: 1)  Allegra 180 Mg Tabs (Fexofenadine hcl) .... Take 1 tablet by mouth once a day 2)  Bayer Childrens Aspirin 81 Mg Chew (Aspirin) .... Take 1 tablet by mouth once a day 3)  Hyzaar 100-25 Mg Tabs (Losartan potassium-hctz) .... Take 1 tablet by mouth once a day- please have pt make appt w/ md 4)  Metformin Hcl 500 Mg Tabs (Metformin hcl) .... 2 tabs two times a day w/ food 5)  Prilosec 20 Mg Cpdr (Omeprazole) .... Take 1 capsule by mouth once a day 6)  Simvastatin 40 Mg Tabs (Simvastatin) .[name].. 1 tab by mouth at bedtime. 7)  Hydrocortisone 1 % Crea (Hydrocortisone) .... Apply to affected area twice daily.  disp 1 large tube 8)  Lisinopril-hydrochlorothiazide 20-25 Mg Tabs (Lisinopril-hydrochlorothiazide) .[name].. 1 tablet by mouth daily - in place of hyzaar    ]

## 2010-06-19 NOTE — Miscellaneous (Signed)
Summary: no refills w/out appt  Pt's meds refilled via Dr. Brantley Stage.  No additional refills will be given w/out appt first.  Pt notified of this via the pharmacy.

## 2010-06-19 NOTE — Assessment & Plan Note (Signed)
Summary: med refills/el   Vital Signs:  Patient Profile:   45 Years Old Female Weight:      158 pounds Temp:     99.2 degrees F Pulse rate:   79 / minute BP sitting:   143 / 90  Pt. in pain?   no  Vitals Entered By: Christen Bame CMA (March 11, 2007 10:13 AM)                  Chief Complaint:  MED REFILLS.  History of Present Illness: 45 yo woman who returns to office today for F/U of 1) DM- Pt has not had routine appt in almost 1 year.  Taking Metformin 500mg  QAM and 1000mg  QPM.  No reported episodes of hypoglycemia, GI upset or diarrhea.  Continues to count carbs and exercise regularly.  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  2) HTN- Has been out of meds for 3 days.  Script awaiting pick up at pharmacy.  Pt exercising regularly and watching her diet but has not been watching her Na intake.  Denies CP, SOB, edema, visual changes or HAs.  3) Hx of Hyperlipidemia- recently controlled w/ diet and exercise, has not had FLP done in quite sometime.    Past Medical History:    Anemia, iron deficiency, unspec. - 280.9    Rhinitis, Allergic - 477.9    Uterine prolapse - 618.1    vaginal delivery x3   Family History:    1 brother, 1 sister - both healthy    3 kids - (as of 01/2005 ages 63, 49 and 25) all healthy    father - d. 4s - renal failure, HTN    mother-d. 46 DM, CHF  Social History:    Reviewed history from 07/17/2006 and no changes required:       no tob/etoh/drug use.  Lives w/her 3 children.  On disability due to glaucoma.current boyfriend for 5 years, monogamous. Feels safe in relationship    Review of Systems      See HPI   Physical Exam  General:     Well-developed,well-nourished,in no acute distress; alert,appropriate and cooperative throughout examination Head:     normocephalic and atraumatic.   Eyes:     Pt w/ PERRL, EOMI, pt w/ very thick glasses b/c she is legally blind. Mouth:     Oral mucosa and oropharynx without  lesions or exudates.  Teeth in good repair. Neck:     No deformities, masses, or tenderness noted. Lungs:     Normal respiratory effort, chest expands symmetrically. Lungs are clear to auscultation, no crackles or wheezes. Heart:     Normal rate and regular rhythm. S1 and S2 normal without gallop, murmur, click, rub or other extra sounds. Abdomen:     Bowel sounds positive,abdomen soft and non-tender without masses, organomegaly or hernias noted. Pulses:     +2 DP and radial pulses Extremities:     No C/C/E  Diabetes Management Exam:    Foot Exam (with socks and/or shoes not present):       Sensory-Pinprick/Light touch:          Left medial foot (L-4): normal          Left dorsal foot (L-5): normal          Left lateral foot (S-1): normal          Right medial foot (L-4): normal          Right dorsal foot (L-5): normal  Right lateral foot (S-1): normal       Sensory-Monofilament:          Left foot: normal          Right foot: normal       Inspection:          Left foot: normal          Right foot: normal       Nails:          Left foot: normal          Right foot: normal    Eye Exam:       Eye Exam done elsewhere    Impression & Recommendations:  Problem # 1:  HYPERTENSION, BENIGN SYSTEMIC (ICD-401.1) Assessment: Unchanged Pt admits to being out of meds for 3 days despite having refill available at pharmacy.  Will not make med changes at this time but will need to follow at future visits. Her updated medication list for this problem includes:    Hyzaar 100-25 Mg Tabs (Losartan potassium-hctz) .[name]... 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  Would prefer her A1C to be lower.  Will increase Metformin to 1000mg  two times a day.  Advised pt if GI  upset she is to decrease back to 500mg  two times a day or alternate 500mg /1000mg .  Pt expresses understanding and is in agreement w/ this plan. Her updated medication list for this problem includes:    Bayer Childrens Aspirin 81 Mg Chew (Aspirin) .[name]... Take 1 tablet by mouth once a day    Hyzaar 100-25 Mg Tabs (Losartan potassium-hctz) .[name]... Take 1 tablet by mouth once a day- please have pt make appt w/ md    Metformin Hcl 500 Mg Tabs (Metformin hcl) .[name]... 2 tabs two times a day w/ food  Orders: A1C-[facility] [id number]) [place]- Est  Level 4 (99214)   Problem # 3:  HYPERCHOLESTEROLEMIA (ICD-272.0) Assessment: Unchanged Pt has not had lipids checked in quite some time.  Will check labs and determine whether pt needs statin or not based on DM guidelines. Orders: Comp Met-[facility] [id number]) Lipid-[facility] [id number]) [place]- Est  Level 4 [id number])   Complete Medication List: 1)  Allegra 180 Mg Tabs (Fexofenadine hcl) .... Take 1 tablet by mouth once a day 2)  Bayer Childrens Aspirin 81 Mg Chew (Aspirin) .... Take 1 tablet by mouth once a day 3)  Hyzaar 100-25 Mg Tabs (Losartan potassium-hctz) .... Take 1 tablet by mouth once a day- please have pt make appt w/ md 4)  Metformin Hcl 500 Mg Tabs (Metformin hcl) .... 2 tabs two times a day w/ food 5)  Prilosec 20 Mg Cpdr (Omeprazole) .... Take 1 capsule by mouth once a day   Patient Instructions: 1)  Please schedule a follow-up appointment in 2-3 months for complete physical. 2)  I will contact you with the results of your blood work 3)  Keep taking your medicines EVERY DAY as directed 4)  If the Metformin upsets your stomach, please call the office and we'll make some changes 5)  Continue to count your carbs and exercise- you look great!    Prescriptions: METFORMIN HCL 500 MG TABS (METFORMIN HCL) 2 tabs two times a day w/ food  #60  x 3   Entered and Authorized by:   Annye Asa  MD   Signed by:   Annye Asa  MD on 03/11/2007   Method used:    Electronically sent to ...       [place], [place]  [address]       Ph: [phone] or [phone]       Fax: [phone]   RxID:   [id number] HYZAAR 100-25 MG TABS (LOSARTAN POTASSIUM-HCTZ) Take 1 tablet by mouth once a day- please have pt make appt w/ MD  #30 x 3   Entered and Authorized by:   Annye Asa  MD   Signed by:   Annye Asa  MD on 03/11/2007   Method used:   Electronically sent to ...       [facility] [phone]*       [address]       [place], [place]  [address]       Ph: [phone] or [phone]       Fax: [phone]   RxID:   [id number]  ] Laboratory Results   Blood Tests   Date/Time Received: March 11, 2007 10:21 AM  Date/Time Reported: March 11, 2007 10:42 AM   HGBA1C: 7.3%   (Normal Range: Non-Diabetic - 3-6%   Control Diabetic - 6-8%)  Comments: ...................................................................DONNA [facility]  March 11, 2007 10:42 AM

## 2010-06-19 NOTE — Progress Notes (Signed)
Summary: Rx Req  Phone Note Refill Request Call back at [phone] Message from:  Patient  Refills Requested: Medication #1:  LISINOPRIL-HYDROCHLOROTHIAZIDE 20-25 MG TABS 1 tablet by mouth daily - in place of Hyzaar  Medication #2:  GLYBURIDE-METFORMIN 2.5-500 MG TABS 1 tab by mouth bid  Medication #3:  HYDROCORTISONE 1 %  CREA apply to affected area twice daily.  disp 1 large tube  Medication #4:  PRODIGY TWIST TOP LANCETS 28G  MISC check sugars as directed. PT USES [facility] ON [address]. PT HAD TO CANCEL FOR TODAY DUE TO WORK CONFLICT.  WILL RESCEDULE WHEN DR. Nadara Eaton RETURNS.  Initial call taken by: Raymond Gurney,  June 19, 2009 12:13 PM  Follow-up for Phone Call        to pcp Follow-up by: Elige Radon RN,  June 19, 2009 12:15 PM    Prescriptions: PRODIGY TWIST TOP LANCETS 28G  MISC (LANCETS) check sugars as directed  #100 x 12   Entered and Authorized by:   Orland Mustard  MD   Signed by:   Orland Mustard  MD on 06/19/2009   Method used:   Electronically to        [facility] [phone]* (retail)       [place], [place]  [id number]       Ph: [phone] or [phone]       Fax: [phone]   RxID:   [id number] PRODIGY BLOOD GLUCOSE TEST  STRP (GLUCOSE BLOOD) use to check blood sugar as directed dispense: 1 box  #1 x 11   Entered and Authorized by:   Orland Mustard  MD   Signed by:   Orland Mustard  MD on 06/19/2009   Method used:   Electronically to        [facility] [phone]* (retail)       [place], [place]  [id number]       Ph: [phone] or [phone]       Fax: [phone]   RxID:   [id number] GLYBURIDE-METFORMIN 2.5-500 MG TABS (GLYBURIDE-METFORMIN) 1 tab by mouth bid  #60 x 2   Entered and Authorized by:   Orland Mustard  MD   Signed by:   Orland Mustard  MD on 06/19/2009   Method used:   Electronically to        [facility]  [phone]* (retail)       [place], [place]  [id number]       Ph: [phone] or [phone]       Fax: [phone]   RxID:   [id number] LISINOPRIL-HYDROCHLOROTHIAZIDE 20-25 MG TABS (LISINOPRIL-HYDROCHLOROTHIAZIDE) 1 tablet by mouth daily - in place of Hyzaar  #34 x 2   Entered and Authorized by:   Orland Mustard  MD   Signed by:   Orland Mustard  MD on 06/19/2009   Method used:   Electronically to        [facility] [phone]* (retail)       [address]       [place], [place]  [id number]       Ph: [phone] or [phone]  Fax: [phone]   [id number] HYDROCORTISONE 1 %  CREA (HYDROCORTISONE) apply to affected area twice daily.  disp 1 large tube  #1 x 3   Entered and Authorized by:   Orland Mustard  MD   Signed by:   Orland Mustard  MD on 06/19/2009   Method used:   Electronically to        [facility] [phone]* (retail)       [place], [place]  [id number]       Ph: [phone] or [phone]       Fax: [phone]   RxID:   [id number]  pt notified that meds have been filled & are at her pharmacy.Elige Radon RN  June 19, 2009 2:40 PM

## 2010-06-19 NOTE — Progress Notes (Signed)
Summary: Rx  Phone Note Call from Patient Call back at Home Phone [phone]   Reason for Call: Refill Medication Summary of Call: Pt is wanting to know if we can call refills in, has been out since Friday 7/17 and has an appt scheduled for 7/27. Initial call taken by: Drucie Ip,  December 07, 2007 3:34 PM      Prescriptions: METFORMIN HCL 500 MG TABS (METFORMIN HCL) 2 tabs two times a day w/ food  #60 Tablet x 0   Entered by:   Mauricia Area CMA,   Authorized by:   Annye Asa  MD   Signed by:   Mauricia Area CMA, on 12/07/2007   Method used:   Electronically sent to ...       [place], [place]  [address]       Ph: [phone] or [phone]       Fax: [phone]   RxID:   [id number] HYZAAR 100-25 MG TABS (LOSARTAN POTASSIUM-HCTZ) Take 1 tablet by mouth once a day- please have pt make appt w/ MD  #30 Tablet x 0   Entered by:   Mauricia Area CMA,   Authorized by:   Annye Asa  MD   Signed by:   Mauricia Area CMA, on 12/07/2007   Method used:   Electronically sent to ...       [place], [place]  [address]       Ph: [phone] or [phone]       Fax: [phone]   RxID:   [id number]     Appended Document: Rx called pt and refilled hyzaar and metformin. advised to keep appt on 12-14-07. pt agreed.

## 2010-06-19 NOTE — Progress Notes (Signed)
Summary: BP Medication  Patient states she was changed to Lisinopril HCTZ 20-25 mg by Dr. Birdie Riddle as Medicaid woudln't cover Hyzaar anymore.  She took her last pill this morning, and when she called the drugstore, they told her her refill request was denied.  In reviewing the notes, it looks as though there was confusion because there is no documentation of Dr. Virgil Benedict change in the chart (other than the refill request), thus I believe Dr. Nadara Eaton denied the refill as she thought the patient was still on Hyzaar.  Will give on refill and route to Primary MD for review and further action as needed................................... ENNIS MD, ERIN February 13, 2008 1:25 PM   Prescriptions: LISINOPRIL-HYDROCHLOROTHIAZIDE 20-25 MG TABS (LISINOPRIL-HYDROCHLOROTHIAZIDE) 1 tablet by mouth daily - in place of Hyzaar  #34 x 0   Entered and Authorized by:   Shella Maxim MD   Signed by:   Shella Maxim MD on 02/13/2008   Method used:   Electronically to        [facility] [phone]* (retail)       [place]       [place], [place]  [address]       Ph: [phone] or [phone]       Fax: [phone]   RxID:   [id number]

## 2010-06-19 NOTE — Assessment & Plan Note (Signed)
Summary: shoulder pain   Vital Signs:  Patient profile:   45 year old female Height:      61 inches Weight:      153.7 pounds BMI:     29.15 Temp:     98.0 degrees F Pulse rate:   90 / minute Pulse rhythm:   regular BP sitting:   132 / 82  (left arm)  Vitals Entered By: Janeth Rase LPN (March 17, 624THL 579FGE PM) CC: Medication refills and right shoulder pain. Pain Assessment Patient in pain? yes     Location: right shoulder Intensity: 5 Type: aching   History of Present Illness: 46 yo F here with right shoulder pain.  Patient states she was moving things on Friday and saturday and may have overdone it.  No acute injury but upper right back and shoulder started hurting about this time and she thinks she aggravated it Tuesday and may have pulled something.  Hurts with combing hair.  kept her up last night.  No previous shoulder issues.  Allergies (verified): No Known Drug Allergies  Physical Exam  General:  Well-developed,well-nourished,in no acute distress; alert,appropriate and cooperative throughout examination Msk:  Neck: FROM Pain with turning to left side and full flexion - felt in right trapezius.  Shoulder: Inspection reveals no abnormalities, atrophy or asymmetry. Tender to palpation right trapezius and infraspinatus body.  No other TTP.   ROM is full in all planes. Rotator cuff strength normal throughout. No signs of impingement with negative Neer and Hawkin's tests, empty can. Speeds and Yergason's tests normal. Normal scapular function observed. No painful arc and no drop arm sign.   Impression & Recommendations:  Problem # 1:  MUSCLE SPASM, TRAPEZIUS MUSCLE, RIGHT (ICD-728.85) Assessment New  Exam most consistent with trapezius strain/spasm.  No evidence of shoulder pathology.  Neck roll exercises, heating pad, NSAID, flexeril for bedtime so she can sleep.  Orders: [place]- Est Level  3 [id number])  Complete Medication List: 1)  Allegra 180 Mg Tabs  (Fexofenadine hcl) .... Take 1 tablet by mouth once a day 2)  Bayer Childrens Aspirin 81 Mg Chew (Aspirin) .... Take 1 tablet by mouth once a day 3)  Hyzaar 100-25 Mg Tabs (Losartan potassium-hctz) .... Take 1 tablet by mouth once a day- please have pt make appt w/ md 4)  Metformin Hcl 500 Mg Tabs (Metformin hcl) .... 2 tabs two times a day w/ food 5)  Prilosec 20 Mg Cpdr (Omeprazole) .... Take 1 capsule by mouth once a day 6)  Simvastatin 40 Mg Tabs (Simvastatin) .[name].. 1 tab by mouth at bedtime. 7)  Hydrocortisone 1 % Crea (Hydrocortisone) .... Apply to affected area twice daily.  disp 1 large tube 8)  Lisinopril-hydrochlorothiazide 20-25 Mg Tabs (Lisinopril-hydrochlorothiazide) .[name].. 1 tablet by mouth daily - in place of hyzaar 9)  Flexeril 5 Mg Tabs (Cyclobenzaprine hcl) .[name].. 1 tab by mouth at bedtime as needed muscle spasms 10)  Meloxicam 15 Mg Tabs (Meloxicam) .[name].. 1 tab by mouth daily with food for pain/inflammation  Patient Instructions: 1)  You have a strain in your trapezius muscle. 2)  Take mobic daily with food daily for the next week (or until pain resolved) then as needed. 3)  Flexeril as needed at nighttime for muscle spasms. 4)  Heating pad 15 minutes maximum or warm washcloth to affected area and massage may be helpful. 5)  Do neck rolls to keep range of motion. 6)  Follow up as needed. 7)  The medication list was  reviewed and reconciled.  All changed / newly prescribed medications were explained.  A complete medication list was provided to the patient / caregiver. Prescriptions: METFORMIN HCL 500 MG TABS (METFORMIN HCL) 2 tabs two times a day w/ food  #120 x 1   Entered and Authorized by:   Karlton Lemon MD   Signed by:   Karlton Lemon MD on 08/03/2008   Method used:   Electronically to        [facility] [phone]* (retail)       [place], [place]  [address]       Ph: [phone] or [phone]       Fax:  [phone]   RxID:   [id number] LISINOPRIL-HYDROCHLOROTHIAZIDE 20-25 MG TABS (LISINOPRIL-HYDROCHLOROTHIAZIDE) 1 tablet by mouth daily - in place of Hyzaar  #34 x 1   Entered and Authorized by:   Karlton Lemon MD   Signed by:   Karlton Lemon MD on 08/03/2008   Method used:   Electronically to        [facility] [phone]* (retail)       [place], [place]  [address]       Ph: [phone] or [phone]       Fax: [phone]   RxID:   [id number] MELOXICAM 15 MG TABS (MELOXICAM) 1 tab by mouth daily with food for pain/inflammation  #30 x 1   Entered and Authorized by:   Karlton Lemon MD   Signed by:   Karlton Lemon MD on 08/03/2008   Method used:   Print then Give to Patient   RxID:   [id number] FLEXERIL 5 MG TABS (CYCLOBENZAPRINE HCL) 1 tab by mouth at bedtime as needed muscle spasms  #15 x 0   Entered and Authorized by:   Karlton Lemon MD   Signed by:   Karlton Lemon MD on 08/03/2008   Method used:   Print then Give to Patient   RxID:   [id number]

## 2010-06-19 NOTE — Miscellaneous (Signed)
  Clinical Lists Changes  Medications: Added new medication of PRODIGY TWIST TOP LANCETS 28G  MISC (LANCETS) check sugars as directed - Signed Removed medication of PRODIGY LANCETS 28G  MISC (LANCETS) use to check blood sugar as directed. dispense 1 box Rx of PRODIGY TWIST TOP LANCETS 28G  MISC (LANCETS) check sugars as directed;  #100 x 12;  Signed;  Entered by: Carin Hock MD;  Authorized by: Carin Hock MD;  Method used: Electronically to [facility] [phone]*, [address], [place], [place], [place]  PL:[id number], Ph: [phone] or [phone], Fax: [phone]    Prescriptions: PRODIGY TWIST TOP LANCETS 28G  MISC (LANCETS) check sugars as directed  #100 x 12   Entered and Authorized by:   Carin Hock MD   Signed by:   Carin Hock MD on 03/08/2009   Method used:   Electronically to        [facility] [phone]* (retail)       [place], [place]  [id number]       Ph: [phone] or [phone]       Fax: [phone]   RxID:   [id number]

## 2010-06-19 NOTE — Assessment & Plan Note (Signed)
Summary: med refills/eo   Vital Signs:  Patient Profile:   45 Years Old Female Weight:      154.8 pounds Temp:     98.3 degrees F Pulse rate:   98 / minute BP sitting:   130 / 83  (left arm)  Pt. in pain?   no  Vitals Entered By: Arnette Schaumann RN (January 06, 2008 11:18 AM)                   Chief Complaint:  f/u and med refills.  History of Present Illness: 45 yo woman w/ 1) DM- pt has not been here since 10/08.  Counting her carbs. Taking Metformin 500mg  two times a day w/ occasionally an extra pill at night.  Denies hypoglycemic sxs.  Did not bring meter or log book.  Denies CP, SOB, N/V, visual changes, HAs, edema.  2) HTN- BP well controlled on current med regimen.  Starting a walking program w/ new husband.  Denies sxs as above  3) Hypercholesterolemia- Pt's LDL last Oct was 112.  Given DM goal is <70.  Will need to start statin.  Discussed this w/ pt.  She is also going to make dietary changes and increase her amount of exercise.    Prior Medications Reviewed Using: Patient Recall  Prior Medication List:  ALLEGRA 180 MG TABS (FEXOFENADINE HCL) Take 1 tablet by mouth once a day [employer] ASPIRIN 81 MG CHEW (ASPIRIN) Take 1 tablet by mouth once a day HYZAAR 100-25 MG TABS (LOSARTAN POTASSIUM-HCTZ) Take 1 tablet by mouth once a day- please have pt make appt w/ MD METFORMIN HCL 500 MG TABS (METFORMIN HCL) 2 tabs two times a day w/ food PRILOSEC 20 MG CPDR (OMEPRAZOLE) Take 1 capsule by mouth once a day      Social History:    no tob/etoh/drug use.  Lives w/her 3 children.  On disability due to glaucoma. recently married  boyfriend of 5 years, monogamous. Feels safe in relationship    Review of Systems      See HPI   Physical Exam  General:     Well-developed,well-nourished,in no acute distress; alert,appropriate and cooperative throughout examination Head:     normocephalic and atraumatic.   Eyes:     Pt w/ PERRL, EOMI, pt w/ very thick glasses b/c  she is legally blind. Neck:     No deformities, masses, or tenderness noted. Lungs:     Normal respiratory effort, chest expands symmetrically. Lungs are clear to auscultation, no crackles or wheezes. Heart:     Normal rate and regular rhythm. S1 and S2 normal without gallop, murmur, click, rub or other extra sounds. Abdomen:     Bowel sounds positive,abdomen soft and non-tender without masses, organomegaly or hernias noted. Pulses:     +2 DP and radial pulses Extremities:     No C/C/E Skin:     dry flaking skin on L breast  Diabetes Management Exam:    Foot Exam (with socks and/or shoes not present):       Sensory-Pinprick/Light touch:          Left medial foot (L-4): normal          Left dorsal foot (L-5): normal          Left lateral foot (S-1): normal          Right medial foot (L-4): normal          Right dorsal foot (L-5): normal  Right lateral foot (S-1): normal       Sensory-Monofilament:          Left foot: normal          Right foot: normal       Inspection:          Left foot: normal          Right foot: normal    Impression & Recommendations:  Problem # 1:  HYPERTENSION, BENIGN SYSTEMIC (ICD-401.1) Assessment: Unchanged BP well controlled.  Continue current meds.  Will get labs in 6 weeks when pt returns to check LFTs after starting statin. Her updated medication list for this problem includes:    Hyzaar 100-25 Mg Tabs (Losartan potassium-hctz) .[name]... Take 1 tablet by mouth once a day- please have pt make appt w/ md  Orders: [facility]- Est  Level 4 [id number])   Problem # 2:  HYPERCHOLESTEROLEMIA (ICD-272.0) Assessment: Unchanged Pt to start statin as LDL goal is <70 given dx of DM.  Will have pt back for labs and CPE in 6 weeks.  Pt expresses understanding and is in agreement w/ this plan. Her updated medication list for this problem includes:    Simvastatin 40 Mg Tabs (Simvastatin) .[name]... 1 tab by mouth at bedtime.  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  Taking 500 QAM and either 500 or 1000 QPM.  A1C stable.  Pt counting carbs.  Applauded her efforts and encouraged her to exercise- she plans on starting walking program w/ new husband.  Stressed importance of quaterly visits.  Pt expressed understanding. Her updated medication list for this problem includes:    Bayer Childrens Aspirin 81 Mg Chew (Aspirin) .[name]... Take 1 tablet by mouth once a day    Hyzaar 100-25 Mg Tabs (Losartan potassium-hctz) .[name]... Take 1 tablet by mouth once a day- please have pt make appt w/ md    Metformin Hcl 500 Mg Tabs (Metformin hcl) .[name]... 2 tabs two times a day w/ food  Orders: A1C-[facility] [id number]) [place]- Est  Level 4 [id number])   Complete Medication List: 1)  Allegra 180 Mg Tabs (Fexofenadine hcl) .... Take 1 tablet by mouth once a day 2)  Bayer Childrens Aspirin 81 Mg Chew (Aspirin) .... Take 1 tablet by mouth once a day 3)  Hyzaar 100-25 Mg Tabs (Losartan potassium-hctz) .... Take 1 tablet by mouth once a day- please have pt make appt w/ md 4)  Metformin Hcl 500 Mg Tabs (Metformin hcl) .... 2 tabs two times a day w/ food 5)  Prilosec 20 Mg Cpdr (Omeprazole) .... Take 1 capsule by mouth once a day 6)  Simvastatin 40 Mg Tabs (Simvastatin) .[name].. 1 tab by mouth at bedtime. 7)  Hydrocortisone 1 % Crea (Hydrocortisone) .... Apply to affected area twice daily.  disp 1 large tube   Patient Instructions: 1)  Please schedule a follow-up appointment in 6 weeks for complete physical and blood work. 2)  Continue to count your carbs and take your Metformin- your numbers look great! 3)  Increase your amount of exercise 4)  Take your cholesterol medicine (Simvastatin) every night 5)  Stretch and ice your foot when you develop pain 6)  Use the hydrocortisone cream on the affected areas twice daily 7)  Take care of  yourself!!   Prescriptions: METFORMIN HCL 500 MG TABS (  METFORMIN HCL) 2 tabs two times a day w/ food  #120 x 3   Entered and Authorized by:   Annye Asa  MD   Signed by:   Annye Asa  MD on 01/06/2008   Method used:   Electronically sent to ...       [place], [place]  [address]       Ph: [phone] or [phone]       Fax: [phone]   RxID:   [id number] HYZAAR 100-25 MG TABS (LOSARTAN POTASSIUM-HCTZ) Take 1 tablet by mouth once a day- please have pt make appt w/ MD  #30 Tablet x 3   Entered and Authorized by:   Annye Asa  MD   Signed by:   Annye Asa  MD on 01/06/2008   Method used:   Electronically sent to ...       [place], [place]  [address]       Ph: [phone] or [phone]       Fax: [phone]   RxID:   [id number] HYDROCORTISONE 1 %  CREA (HYDROCORTISONE) apply to affected area twice daily.  disp 1 large tube  #1 x 3   Entered and Authorized by:   Annye Asa  MD   Signed by:   Annye Asa  MD on 01/06/2008   Method used:   Electronically sent to ...       [place], [place]  [address]       Ph: [phone] or [phone]       Fax: [phone]   RxID:   [id number] SIMVASTATIN 40 MG  TABS (SIMVASTATIN) 1 tab by mouth at bedtime.  #30 x 3   Entered and Authorized by:   Annye Asa  MD   Signed by:   Annye Asa  MD on 01/06/2008   Method used:   Electronically sent to ...       [facility] [phone]*       [address]       [place], [place]  [address]       Ph: [phone] or [phone]       Fax: [phone]   RxID:   [id number]  ] Laboratory Results    Blood Tests   Date/Time Received: January 06, 2008 11:23 AM  Date/Time Reported: January 06, 2008 11:31 AM   HGBA1C: 7.2%   (Normal Range: Non-Diabetic - 3-6%   Control Diabetic - 6-8%)  Comments: ...........test performed by...........Marland KitchenHedy Camara, CMA

## 2010-06-19 NOTE — Letter (Signed)
Summary: Out of Work  [facility]  [address]   [place], [place] [address]   Phone: [phone]  Fax: [phone]    October 20, 2008   Employee:  Tina Horton    To Whom It May Concern:   For Medical reasons, please excuse the above named employee from work for the following dates:  Start:   October 20, 2008  End:   October 20, 2008  If you need additional information, please feel free to contact our office.         Sincerely,    Carin Hock MD

## 2010-06-19 NOTE — Progress Notes (Signed)
Summary: Rx  Phone Note Call from Patient Call back at Home Phone [phone]   Reason for Call: Refill Medication Summary of Call: Is needing a new rx for acucheck and test strips.  Machine stopped working, Community education officer on another acucheck, wants to discuss newest machine. Initial call taken by: Drucie Ip,  December 01, 2007 11:30 AM  Follow-up for Phone Call        called pt and advised to sched. ov with pcp. pt agreed.  pt said, that [facility] has faxed a request for glucometer. told pt, that i will fwd. this message to dr.Alexandros Ewan, advised again to keep her appt on 12-14-07 Follow-up by: Mauricia Area CMA,,  December 01, 2007 2:48 PM  Additional Follow-up for Phone Call Additional follow up Details #1::        If pt doesn't keep appt on 7/27 she will NOT get any more prescriptions.  She is diabetic and has not been seen since 10/08. Additional Follow-up by: Annye Asa  MD,  December 02, 2007 8:48 AM         Appended Document: Rx Did not get any requests for new glucometer...either electronic or in my paper mailbox.  Have pt let me know what type of meter she desires and I will write the prescription.

## 2010-06-19 NOTE — Progress Notes (Signed)
Summary: wi request  Phone Note Call from Patient Call back at Home Phone [phone]   Reason for Call: Talk to Nurse Summary of Call: pt is requesting a wi appt, she thinks her bump on her stomach is infected Initial call taken by: ERIN LEVAN,  August 26, 2006 4:12 PM  Follow-up for Phone Call        reports she has a cyst on abd just above naval area that she has had for years  that becomes infected from time to time. starting to feel tender and appear reddened and inflammed. appointmnet scheduled 08/28/06 Follow-up by: Marcell Barlow RN,  August 26, 2006 4:24 PM

## 2010-06-19 NOTE — Assessment & Plan Note (Signed)
Summary: ABD ABSCESS   Vital Signs:  Patient Profile:   45 Years Old Female Weight:      151 pounds (68.64 kg) Temp:     98 degrees F (36.67 degrees C) Pulse rate:   97 / minute BP sitting:   119 / 73  Pt. in pain?   no  Vitals Entered By: Dalbert Mayotte (August 28, 2006 10:22 AM)                Procedure Note Last Tetanus: Done. (02/17/1998)  Cyst Removal: Onset of lesion: 3 days  Incision & Drainage: The patient complains of pain, inflammation, tenderness, and swelling but denies redness, discharge, and fever. Onset of lesion: 3 days Indication: inflamed lesion  Procedure # 1: aspiration for culture    Size (in cm): 1.5 x 1.5    Location: mid abdomen, just above umblilicus    Comment: no complications.  pt tolerated aspiration well.  good hemostatsis after procedure.    Instrument used: 25 gauge 1.5in needle    Anesthesia: 1.0 ml 1% lidocaine w/epinephrine  Cleaned and prepped with: betadine Wound dressing: band-aid Instructions: daily dressing changes   Chief Complaint:  cyst on abdomen.  History of Present Illness: 45 yo F, with h/o I&D of abd cyst x64yr ago, presents to clinic today with similar complaint of "cyst on abdomen".  Located just above umbilicus.  Painful.  Drained yellow fluid a couple of days ago.  Seems to be getter bigger.   She has a similar "cyst" on her right buttock that also became tender and painful a couple of days ago.  Started draining and seems to be getting better.  No other similar lesions.  She denies F/C, N/V/D.  She does have a PMH of DM in which she says her CBGs are "in good control".        Risk Factors:  Tobacco use:  never    Physical Exam  General:     Well-developed,well-nourished,in no acute distress; alert,appropriate and cooperative throughout examination Head:     normocephalic and atraumatic.   Ears:     hearing intact Neck:     supple.   Skin:     1.5x1.5 cm tender lesion with induration just above  umbilicus; no erythema; no drainage  1x1cm area of induration upper mid right buttock; no drainage; no erythema; non tender to palpation. Psych:     Cognition and judgment appear intact. Alert and cooperative with normal attention span and concentration. No apparent delusions, illusions, hallucinations    Impression & Recommendations:  Problem # 1:  ABSCESS, SKIN (ICD-682.9) Very little aspirate, therefore did not attempt I&D of lesion.  Will start pt on Bactrim DS x 10 days.  F/u aspirate cx.  She was given red flags to call and/or to report to clinic.  She was encouraged to schedule f/u appt with her PCP to discuss DM management in hopes to prevent frequency of this type of infection.  For resolution of abd site; would recommend surgical removal/surgery referral is recurrance.   Orders: Culture, Wound -[id number]) [facility]- Est  Level 4 ([address]) Gram Stain-[facility] ([phone])    Patient Instructions: 1)  Please schedule a follow-up appointment early next week. 2)  Take an Aspirin every day. 3)  Check your blood sugars regularly. If your readings are usually above : or below 70 you should contact our office. 4)  It is important that your Diabetic A1c level is checked every 3 months. 5)  Call office  if cyst becomes aggressively larger over the next 3 days, or if you develop fever or chills. 6)  [facility] [address].

## 2010-06-19 NOTE — Progress Notes (Signed)
Summary: status of rx  Phone Note Call from Patient Call back at [phone]   Reason for Call: Talk to Nurse Summary of Call: pt is checking status of rx for metformin, pt sts she is completely out and has an appt to see her md on 10/22,  pt pt goes to [address] Initial call taken by: ERIN LEVAN,  February 24, 2007 2:14 PM  Follow-up for Phone Call        Pt is checking status.  Pt states she needs it ASAP and wants it done this afternoon. Follow-up by: Drucie Ip,  February 24, 2007 4:19 PM  Additional Follow-up for Phone Call Additional follow up Details #1::        Pt was told in June that she would not get any more refills w/out an appt.  I have never seen this pt in the more than 2 years I have been her doctor.  If she has an appt for 10/22 I will give her enough meds until that date and no more.  She must be seen- if she needs diabetes medication she needs diabetic exams and labs.  Please notify pt that this is the last accomadation that will be made. Additional Follow-up by: Annye Asa  MD,  February 24, 2007 8:54 PM    Additional Follow-up for Phone Call Additional follow up Details #2::    Lm on pt Vm Dr Birdie Riddle has refilled glucophage, but will not again if appt is not kept on 10/22./08.  Advised to call office [facility]) with any questions or concerns Follow-up by: Lupita Raider CMA,,  February 25, 2007 2:39 PM  New/Updated Medications: METFORMIN HCL 500 MG TABS (METFORMIN HCL) 1 tab by mouth Q am and 2 tabs by mouth Q pm   Prescriptions: METFORMIN HCL 500 MG TABS (METFORMIN HCL) 1 tab by mouth Q am and 2 tabs by mouth Q pm  #45 x 0   Entered and Authorized by:   Annye Asa  MD   Signed by:   Annye Asa  MD on 02/24/2007   Method used:   Electronically sent to ...       [facility] [phone]*       [address]       [place], [place]  [address]       Ph: [phone] or [phone]       Fax: [phone]   RxID:   [id number]

## 2010-06-19 NOTE — Letter (Signed)
Summary: Out of Work  [facility]  [address]   [place], [place] [address]   Phone: [phone]  Fax: [phone]    October 19, 2008   Employee:  Tina Horton    To Whom It May Concern:   For Medical reasons, please excuse the above named employee from work for the following dates:  Start:   June 1st afternoon  End:   June 1st afternoon  If you need additional information, please feel free to contact our office.         Sincerely,    Carin Hock MD

## 2010-06-19 NOTE — Progress Notes (Signed)
Summary: needs note  Phone Note Call from Patient Call back at [phone]   Caller: Patient Summary of Call: still has a sore throat and needs a note to be out of work today. Initial call taken by: Audie Clear,  October 20, 2008 9:07 AM  Follow-up for Phone Call        fwd. to dr.overstreet for review Follow-up by: Mauricia Area CMA,,  October 20, 2008 9:33 AM  Additional Follow-up for Phone Call Additional follow up Details #1::        Will give note for one more day, but we really did not address her sore throat much during her visit.  so if still hurting after today, need to come back for acute visit. Additional Follow-up by: Carin Hock MD,  October 20, 2008 12:57 PM

## 2010-06-19 NOTE — Letter (Signed)
Summary: Generic Letter  [facility]  [address]   [place], [place] [address]   Phone: [phone]  Fax: [phone]    10/04/2008  [name] [address], [place]  [address]  Dear [name],  I was unable to contact you by phone today, so I am sending a letter. Your physician asked me to let you know that you will need to schedule an office visit before she can refill your medications again.   If you have any questions, please call us at [phone].   Thank you!   Sincerely,     Janeth Rase LPN

## 2010-06-19 NOTE — Progress Notes (Signed)
Summary: Triage  Phone Note Call from Patient Call back at [phone]   Summary of Call: pt is wanting to speak with rn about the flu - is concerned about her fever Initial call taken by: Drucie Ip,  September 10, 2006 2:53 PM  Follow-up for Phone Call        she was concerned that fever comes back 4 hrs after tyl. told her this was to be expected with the flu. continue q4h with plenty of fluids. highest 102.3 Follow-up by: Elige Radon RN,  September 10, 2006 3:02 PM

## 2010-06-19 NOTE — Progress Notes (Signed)
Summary: Rx Req  Phone Note Call from Patient Call back at Home Phone [phone] Call back at [phone] ext [phone]   Caller: Patient Summary of Call: Pt needs new diabetic meter and it is the Acu Check [facility] can the rx be done today she has rx's waitng for the strips at the front desk and her daughter is coming to pick that up.  Her moniter is broken. Initial call taken by: Raymond Gurney,  October 24, 2008 2:28 PM  Follow-up for Phone Call         spoke with patient then rx for new glucose monitor  called to pharmacy as requested by patient. Follow-up by: Marcell Barlow RN,  October 24, 2008 2:38 PM    New/Updated Medications: * ACCU CHEK AVIVA GLUCOSE MONITOR use as directed PRODIGY BLOOD GLUCOSE MONITOR  DEVI (BLOOD GLUCOSE MONITORING SUPPL) use to check blood sugar as directed PRODIGY BLOOD GLUCOSE TEST  STRP (GLUCOSE BLOOD) use to check blood sugar as directed dispense: 1 box PRODIGY LANCETS 28G  MISC (LANCETS) use to check blood sugar as directed. dispense 1 box   Prescriptions: PRODIGY LANCETS 28G  MISC (LANCETS) use to check blood sugar as directed. dispense 1 box  #1 x 11   Entered and Authorized by:   Orland Mustard  MD   Signed by:   Orland Mustard  MD on 10/25/2008   Method used:   Handwritten   [id number] PRODIGY BLOOD GLUCOSE TEST  STRP (GLUCOSE BLOOD) use to check blood sugar as directed dispense: 1 box  #1 x 11   Entered and Authorized by:   Orland Mustard  MD   Signed by:   Orland Mustard  MD on 10/25/2008   Method used:   Handwritten   [id number] PRODIGY BLOOD GLUCOSE MONITOR  DEVI (BLOOD GLUCOSE MONITORING SUPPL) use to check blood sugar as directed  #1 x 0   Entered and Authorized by:   Orland Mustard  MD   Signed by:   Orland Mustard  MD on 10/25/2008   Method used:   Handwritten   [id number] ACCU CHEK AVIVA GLUCOSE MONITOR use as directed  #1 x 0   Entered by:   Marcell Barlow RN   Authorized by:   Orland Mustard  MD   Signed by:    Orland Mustard  MD on 10/25/2008   Method used:   Telephoned to ...         [id number]  patient calls back stating medicaid will  only pay for Prodigy glucose meter.. she needs rx for the meter , strips and lancets. the previous rx was for accu check strips . will send message to MD to please prescribe. Marcell Barlow RN  October 24, 2008 2:58 PM  Appended Document: Rx Req patient notified to pick up.

## 2010-06-19 NOTE — Letter (Signed)
Summary: Out of Work  [facility]  [address]   [place], [place] [address]   Phone: [phone]  Fax:     August 28, 2006   Employee:  Tina Horton    To Whom It May Concern:   For Medical reasons, please excuse the above named employee from lifting greater than 5 pounds until her next office visit in 1 week.  Start:   08/28/2006  End:   09/03/2006  If you need additional information, please feel free to contact our office.         Sincerely,    [name] MD

## 2010-06-19 NOTE — Miscellaneous (Signed)
Summary: refill request  Clinical Lists Changes  [facility] . called to talk to patient and she also needs Lisinopril/ HCTZ.  patient has cancelled the last 3 appointments. she has not been seen except for a work in problem appointment in June since 07/2008. patient states she no longer has medicaid and cannot afford to come in . states she needs to be set up with financial counselor. consulted with Dr. Wendy Poet and he advises we cannot refill her meds without her being seen first . Appointment scheduled with [facility] for 03/16/2010 to get refills.  she also has to pay for her meds and this is also a problem for her . gave her  [facility]'s number and ask her to call tomorrow to get set up for appointment. stressed the importance of followng through on these measures. Marcell Barlow RN  March 12, 2010 4:31 PM

## 2010-06-19 NOTE — Assessment & Plan Note (Signed)
Summary: DNKA   DPG   

## 2010-06-19 NOTE — Assessment & Plan Note (Signed)
Summary: needs referral,df   Vital Signs:  Patient profile:   45 year old female Height:      61 inches Weight:      150.6 pounds BMI:     28.56 Temp:     98.5 degrees F oral Pulse rate:   94 / minute Pulse rhythm:   regular BP sitting:   127 / 84  (left arm)  Vitals Entered By: Janeth Rase LPN (June  2, 624THL D34-534 AM) CC: Skin irritation on breast.  Possible viral infection. Is Patient Diabetic? Yes  Pain Assessment Patient in pain? no        Primary Care Provider:  Orland Mustard  MD  CC:  Skin irritation on breast.  Possible viral infection.[name]  History of Present Illness: 45 YO last seen for chronic problems 8/09 (seen for [facility] complaint in 3/10).  Discussed:  1.  n/v/d--past few days.  left work  early yesterday.  would like note.  still a little nauseated but mostly resolved  2.  dry, itchy spot on right nipple--noticed  few weeks ago after she got a new bra.  had exact same sxs in same location many months ago.  given 1%hc cream and it got better.  then it re-emerged few weeks ago exact same place.  no pain, bleeding, nipple discharge.  not up-to-date on mammogram  3.  DM--taking metformin 500 three times a day.  She has tried to take 1000mg  at a time, but causes GI upset.  last A1C7.2 in 8/09  4.  HTN--on lisinopril-hctz.  bp not quite at goal today at 127/85.  no problems with meds  5.  ophtho issues--due for diabetic eye exam.  also has glaucoma and other vision problems (legally blind).  has resolved issue with scheduling (see previous phone note).  she is in process of getting appt.  6.  other--also has scratchy throat and nasal congestion that we do not have time to discuss today   contact cell:  [phone] work:  [phone] ext [phone]  Allergies: No Known Drug Allergies  Physical Exam  General:  Well-developed,well-nourished,in no acute distress; alert,appropriate and cooperative throughout examination Breasts:  skin:  1cm raised slightly scale lesion on  right areola at appx 2 o clock No massed,  tenderness, bulging, retraction, inflamation, nipple discharge noted.   Lungs:  Normal respiratory effort, chest expands symmetrically. Lungs are clear to auscultation, no crackles or wheezes. Heart:  slightly tachy, regular rhythm.  2-3/6 SEM best heard at R upper sternal border Pulses:  2+dp pulses Axillary Nodes:  No palpable lymphadenopathy Additional Exam:  vital signs reviewed   Diabetes Management Exam:    Foot Exam (with socks and/or shoes not present):       Sensory-Pinprick/Light touch:          Left medial foot (L-4): normal          Left dorsal foot (L-5): normal          Left lateral foot (S-1): normal          Right medial foot (L-4): normal          Right dorsal foot (L-5): normal          Right lateral foot (S-1): normal       Sensory-Monofilament:          Left foot: normal          Right foot: normal       Inspection:  Left foot: normal          Right foot: normal       Nails:          Left foot: normal          Right foot: normal   Impression & Recommendations:  Problem # 1:  SKIN LESION (ICD-709.9)  Examined pt with Dr. Martinique.  Think this is likely nummular eczema or some other dermatitis.  However, is a little concerning that the lesion reappeared months later in the exact same place as before.  Want to make sure we rule out Pagets.  Will send to derm for eval and possible skin biopsy.    Orders: [facility]- Est  Level 4 [id number]) Dermatology Referral (Derma)  Problem # 2:  HYPERTENSION, BENIGN SYSTEMIC (ICD-401.1) almost at goal.  no changes today The following medications were removed from the medication list:    Hyzaar 100-25 Mg Tabs (Losartan potassium-hctz) .[name]... Take 1 tablet by mouth once a day- please have pt make appt w/ md Her updated medication list for this problem includes:    Lisinopril-hydrochlorothiazide 20-25 Mg Tabs (Lisinopril-hydrochlorothiazide) .[name]... 1 tablet by mouth daily - in place of  hyzaar  Orders: [facility]- Est  Level 4 (99214) Comp Met-[facility] [id number])  Problem # 3:  HYPERCHOLESTEROLEMIA (ICD-272.0) Assessment: Unchanged  FLP today The following medications were removed from the medication list:    Simvastatin 40 Mg Tabs (Simvastatin) .[name]... 1 tab by mouth at bedtime.  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  will call her with results and adjust meds as needed The following medications were removed from the medication list:    Hyzaar 100-25 Mg Tabs (Losartan potassium-hctz) .[name]... Take 1 tablet by mouth once a day- please have pt make appt w/ md Her updated medication list for this problem includes:    Bayer Childrens Aspirin 81 Mg Chew (Aspirin) .[name]... Take 1 tablet by mouth once a day    Metformin Hcl 500 Mg Tabs (Metformin hcl) .[name]... 1 tablet by mouth three times a day    Lisinopril-hydrochlorothiazide 20-25 Mg Tabs (Lisinopril-hydrochlorothiazide) .[name]... 1 tablet by mouth daily - in place of hyzaar  Orders: A1C-[facility] [id number]) [facility]- Est  Level 4 (99214) CBC-[facility] [id number])  Problem # 6:  SYSTOLIC MURMUR (99991111.2) Assessment: New may need cards eval.  will leave this for PCP to decide  Problem # 7:  NAUSEA (ICD-787.02) Assessment: Improved getting better.  gave her work excuse for yest afternoon The following medications were removed from the medication list:    Allegra 180 Mg Tabs (Fexofenadine hcl) .[name]... Take 1 tablet by mouth once a day  Complete Medication List: 1)  Bayer Childrens Aspirin 81 Mg Chew (Aspirin) .... Take 1 tablet by mouth once a day 2)  Metformin Hcl 500 Mg Tabs (Metformin hcl) .[name].. 1 tablet by mouth three times a day 3)  Prilosec 20 Mg Cpdr (Omeprazole) .... Take 1 capsule by mouth once a day 4)   Hydrocortisone 1 % Crea (Hydrocortisone) .... Apply to affected area twice daily.  disp 1 large tube 5)  Lisinopril-hydrochlorothiazide 20-25 Mg Tabs (Lisinopril-hydrochlorothiazide) .[name].. 1 tablet by mouth daily - in place of hyzaar 6)  Meloxicam 15 Mg Tabs (Meloxicam) .[name].. 1 tab by mouth daily with food for pain/inflammation as needed pain  Patient Instructions: 1)  It was nice to meet you today.  2)  I will call you with your lab results. 3)  Please schedule a follow-up appointment within the next 2 months for a complete physical and pap and to follow up your heart murmur. 4)  We will help you get an appointment with a dermatologist to look at the spot on her breast.  5)  Be sure to scheduled your mammogram.    Appended Document: A1c  7.6 %    Lab Visit   Laboratory Results   Blood Tests   Date/Time Received: October 19, 2008 9:29 AM  Date/Time Reported: October 19, 2008 10:33 AM   HGBA1C: 7.6%   (Normal Range: Non-Diabetic - 3-6%   Control Diabetic - 6-8%)  Comments: ...............test performed by......Marland KitchenBonnie A. Martinique, [place] (ASCP)     Orders Today:

## 2010-06-19 NOTE — Letter (Signed)
Summary: *Referral Letter  [facility]  [address]   [place], [place] [address]   Phone: [phone]  Fax: [phone]    10/19/2008  Thank you in advance for agreeing to see my patient:  Tina Horton [address] 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  disp 1 large tube 5)  LISINOPRIL-HYDROCHLOROTHIAZIDE 20-25 MG TABS (LISINOPRIL-HYDROCHLOROTHIAZIDE) 1 tablet by mouth daily - in place of Hyzaar 6)  MELOXICAM 15 MG TABS (MELOXICAM) 1 tab by mouth daily with food for pain/inflammation as needed pain   Past Medical History: 1)  Anemia, iron deficiency, unspec. 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  Sincerely,  Carin Hock MD

## 2010-06-19 NOTE — Miscellaneous (Signed)
Summary: med changes  Clinical Lists Changes  Medications: Removed medication of METFORMIN HCL 500 MG TABS (METFORMIN HCL) 1 tablet by mouth three times a day Added new medication of GLYBURIDE-METFORMIN 2.5-500 MG TABS (GLYBURIDE-METFORMIN) 1 tab by mouth bid - Signed Added new medication of SIMVASTATIN 40 MG TABS (SIMVASTATIN) 1 tab by mouth at bedtime for cholesterol Rx of GLYBURIDE-METFORMIN 2.5-500 MG TABS (GLYBURIDE-METFORMIN) 1 tab by mouth bid;  #60 x 6;  Signed;  Entered by: Carin Hock MD;  Authorized by: Carin Hock MD;  Method used: Electronically to [facility] [phone]*, [address], [place], [place], [place]  [id number], Ph: [phone] or [phone], Fax: [phone]    Prescriptions: GLYBURIDE-METFORMIN 2.5-500 MG TABS (GLYBURIDE-METFORMIN) 1 tab by mouth bid  #60 x 6   Entered and Authorized by:   Carin Hock MD   Signed by:   Carin Hock MD on 10/20/2008   Method used:   Electronically to        [facility] [phone]* (retail)       [place], [place]  [id number]       Ph: [phone] or [phone]       Fax: [phone]   RxID:   [id number]

## 2010-06-19 NOTE — Assessment & Plan Note (Signed)
Summary: knee pain,df   Vital Signs:  Patient profile:   45 year old female Height:      61 inches Weight:      158 pounds BMI:     29.96 BSA:     1.71 Temp:     98.1 degrees F Pulse rate:   116 / minute BP sitting:   136 / 84  Vitals Entered By: Christen Bame CMA (November 15, 2009 3:11 PM) CC: right knee pain x 5 days Is Patient Diabetic? No Pain Assessment Patient in pain? no        Primary Care Provider:  Orland Mustard  MD  CC:  right knee pain x 5 days.  History of Present Illness: 1) Right knee pain:  Works at [employer]; 5 days ago was walking and hit right knee (over front of patella) on low-lying jewelry case. Initial pain took about 1 or 2 days to resolve completely. Developed "muscle spasm" in right lateral thigh two days after injury which is worse after long day on feet. Wears high heeled shoes on all work days. . Denies swelling, erythema, pain elsewhere except as described above, weakness, numbness, tingling.     Habits & Providers  Alcohol-Tobacco-Diet     Tobacco Status: never  Current Medications (verified): 1)  Bayer Childrens Aspirin 81 Mg Chew (Aspirin) .... Take 1 Tablet By Mouth Once A Day 2)  Prilosec 20 Mg Cpdr (Omeprazole) .... Take 1 Capsule By Mouth Once A Day 3)  Hydrocortisone 1 %  Crea (Hydrocortisone) .... Apply To Affected Area Twice Daily.  Disp 1 Large Tube 4)  Lisinopril-Hydrochlorothiazide 20-25 Mg Tabs (Lisinopril-Hydrochlorothiazide) .[name].. 1 Tablet By Mouth Daily - in Place of Hyzaar 5)  Meloxicam 15 Mg Tabs (Meloxicam) .[name].. 1 Tab By Mouth Daily With Food For Pain/inflammation As Needed Pain 6)  Glyburide-Metformin 2.5-500 Mg Tabs (Glyburide-Metformin) .[name].. 1 Tab By Mouth Bid 7)  Simvastatin 40 Mg Tabs (Simvastatin) .[name].. 1 Tab By Mouth At Bedtime For Cholesterol 8)  Prodigy Blood Glucose Monitor  Devi (Blood Glucose Monitoring Suppl) .... Use To Check Blood Sugar As Directed 9)  Prodigy Blood Glucose Test  Strp (Glucose Blood)  .... Use To Check Blood Sugar As Directed Dispense: 1 Box 10)  Prodigy Twist Top Lancets 28g  Misc (Lancets) .... Check Sugars As Directed 11)  Flexeril 5 Mg Tabs (Cyclobenzaprine Hcl) .... One Tab By Mouth At Bedtime As Needed For Muscle Spasm  Allergies (verified): No Known Drug Allergies  Review of Systems       as per HPI.   Physical Exam  General:  alert, NAD  Msk:  mild crepitus right knee with flexion extension negative anterior / posterior drawer no swelling, bruising or erythema or effusion full rom without pain at hip and knee on right negative faber on right   Impression & Recommendations:  Problem # 1:  SPASM, MUSCLE (ICD-728.85) Assessment New  Will treat with flexeril, meloxicam as below, as this combination has worked in the past for muscle spasm elsewhere for this patient. Advised on RICE therapy as needed, reviewed stretching exercises as well. Exam completely benign with regards to hip and knee on right. Follow up as needed.   Orders: [facility]- Est Level  3 [id number])  Complete Medication List: 1)  Bayer Childrens Aspirin 81 Mg Chew (Aspirin) .... Take 1 tablet by mouth once a day 2)  Prilosec 20 Mg Cpdr (Omeprazole) .... Take 1 capsule by mouth once a day 3)  Hydrocortisone 1 %  Crea (Hydrocortisone) .... Apply to affected area twice daily.  disp 1 large tube 4)  Lisinopril-hydrochlorothiazide 20-25 Mg Tabs (Lisinopril-hydrochlorothiazide) .[name].. 1 tablet by mouth daily - in place of hyzaar 5)  Meloxicam 15 Mg Tabs (Meloxicam) .[name].. 1 tab by mouth daily with food for pain/inflammation as needed pain 6)  Glyburide-metformin 2.5-500 Mg Tabs (Glyburide-metformin) .[name].. 1 tab by mouth bid 7)  Simvastatin 40 Mg Tabs (Simvastatin) .[name].. 1 tab by mouth at bedtime for cholesterol 8)  Prodigy Blood Glucose Monitor Devi (Blood glucose monitoring suppl) .... Use to check blood sugar as directed 9)  Prodigy Blood Glucose Test Strp (Glucose blood) .... Use to check blood sugar as  directed dispense: 1 box 10)  Prodigy Twist Top Lancets 28g Misc (Lancets) .... Check sugars as directed 11)  Flexeril 5 Mg Tabs (Cyclobenzaprine hcl) .... One tab by mouth at bedtime as needed for muscle spasm  Other Orders: A1C-[facility] [id number]) Prescriptions: FLEXERIL 5 MG TABS (CYCLOBENZAPRINE HCL) one tab by mouth at bedtime as needed for muscle spasm  #30 x 0   Entered and Authorized by:   Mariana Arn  MD   Signed by:   Mariana Arn  MD on 11/15/2009   Method used:   Electronically to        [facility] [phone]* (retail)       [place], [place]  [id number]       Ph: [phone] or [phone]       Fax: [phone]   RxID:   [id number] MELOXICAM 15 MG TABS (MELOXICAM) 1 tab by mouth daily with food for pain/inflammation as needed pain  #30 x 0   Entered and Authorized by:   Mariana Arn  MD   Signed by:   Mariana Arn  MD on 11/15/2009   Method used:   Electronically to        [facility] [phone]* (retail)       [place], [place]  [id number]       Ph: [phone] or [phone]       Fax: [phone]   [id number] MELOXICAM 15 MG TABS (MELOXICAM) 1 tab by mouth daily with food for pain/inflammation as needed pain  #30 x 0   Entered and Authorized by:   Mariana Arn  MD   Signed by:   Mariana Arn  MD on 11/15/2009   Method used:   Print then Give to Patient   RxID:   [id number]   Laboratory Results   Blood Tests   Date/Time Received: November 15, 2009 3:39  PM  Date/Time Reported: November 15, 2009 6:04 PM   HGBA1C: 7.3%   (Normal Range: Non-Diabetic - 3-6%   Control Diabetic - 6-8%)  Comments: ...............test performed by......Marland KitchenBonnie A. Martinique, MLS (ASCP)cm

## 2010-06-19 NOTE — Miscellaneous (Signed)
Summary: hyzaar refill  Medications Added ALLEGRA 180 MG TABS (FEXOFENADINE HCL) Take 1 tablet by mouth once a day BAYER CHILDRENS ASPIRIN 81 MG CHEW (ASPIRIN) Take 1 tablet by mouth once a day HYZAAR 100-25 MG TABS (LOSARTAN POTASSIUM-HCTZ) Take 1 tablet by mouth once a day- please have pt make appt w/ MD METFORMIN HCL 500 MG TABS (METFORMIN HCL)  PRILOSEC 20 MG CPDR (OMEPRAZOLE) Take 1 capsule by mouth once a day       Clinical Lists Changes  Medications: Added new medication of ALLEGRA 180 MG TABS (FEXOFENADINE HCL) Take 1 tablet by mouth once a day Added new medication of BAYER CHILDRENS ASPIRIN 81 MG CHEW (ASPIRIN) Take 1 tablet by mouth once a day Added new medication of HYZAAR 100-25 MG TABS (LOSARTAN POTASSIUM-HCTZ) Take 1 tablet by mouth once a day- please have pt make appt w/ MD - Signed Added new medication of METFORMIN HCL 500 MG TABS (METFORMIN HCL) Added new medication of PRILOSEC 20 MG CPDR (OMEPRAZOLE) Take 1 capsule by mouth once a day Rx of HYZAAR 100-25 MG TABS (LOSARTAN POTASSIUM-HCTZ) Take 1 tablet by mouth once a day- please have pt make appt w/ MD;  #30 x 1;  Signed;  Entered by: Annye Asa  MD;  Authorized by: Annye Asa  MD;  Method used: Electronic Observations: Added new observation of LLIMPORTMEDS: completed (01/02/2007 8:17)    Prescriptions: HYZAAR 100-25 MG TABS (LOSARTAN POTASSIUM-HCTZ) Take 1 tablet by mouth once a day- please have pt make appt w/ MD  #30 x 1   Entered and Authorized by:   Annye Asa  MD   Signed by:   Annye Asa  MD on 01/02/2007   Method used:   Electronically sent to ...       [id number]       [address]       [place], [place]  [address]       Ph: [phone] or [phone]       Fax: [phone]   RxID:   [id number]

## 2010-06-21 NOTE — Progress Notes (Signed)
  Phone Note Call from Patient   Caller: Patient Summary of Call: Pt called to get an appt for sinus flareup.  Referred to Urgent Care per Triage Nurse Initial call taken by: Eusebio Friendly,  May 03, 2010 3:55 PM

## 2010-06-22 ENCOUNTER — Telehealth: Payer: Self-pay | Admitting: Family Medicine

## 2010-06-27 NOTE — Progress Notes (Addendum)
Summary: triage  Phone Note Call from Patient Call back at [phone]   Caller: Patient Summary of Call: her BS is elevated to over 300 & climbing - not sure what to do -  Initial call taken by: Audie Clear,  June 19, 2010 1:54 PM  Follow-up for Phone Call        Pateint reports that she awoke with a scratchy throat yesterday and to soothe it she ate 2 oranges.  Her BS normally runs below 100.  When she checked it a few hours after eating the oranges it was up to 200.  She continued to check it and it peaked at 353 last night.  She took her Metformin and Glyburide as prescribed and by this morning the BS had dropped back down to 300.  Advised pt to continue to monitor it about every 4 hours.  Also advised her to drink plenty of water.  If BS does not continue to drop  after checking throughout the evening I would contact her PCP for further advise.  Pt agreeable. Follow-up by: Elray Mcgregor RN,  June 19, 2010 2:25 PM

## 2010-07-05 NOTE — Progress Notes (Signed)
Summary: refill  Phone Note Refill Request Call back at Home Phone [phone] Message from:  Patient  Refills Requested: Medication #1:  PRODIGY BLOOD GLUCOSE TEST  STRP use to check blood sugar as directed dispense: 1 box [facility] [place]  Initial call taken by: Audie Clear,  June 22, 2010 8:35 AM  Follow-up for Phone Call        will forward to MD . He will be in clinic this afternoon. Follow-up by: Marcell Barlow RN,  June 22, 2010 9:03 AM    Prescriptions: PRODIGY BLOOD GLUCOSE TEST  STRP (GLUCOSE BLOOD) use to check blood sugar as directed dispense: 1 box  #1 x 11   Entered and Authorized by:   Luetta Nutting DO   Signed by:   Luetta Nutting DO on 06/25/2010   Method used:   Electronically to        [facility] (retail)       [address]       [place], [place]  [address]       Ph: [phone]       Fax: [phone]   RxID:   [id number]

## 2010-11-18 ENCOUNTER — Other Ambulatory Visit: Payer: Self-pay | Admitting: Family Medicine

## 2010-11-18 NOTE — Telephone Encounter (Signed)
Refill request

## 2012-09-08 ENCOUNTER — Emergency Department (INDEPENDENT_AMBULATORY_CARE_PROVIDER_SITE_OTHER)
Admission: EM | Admit: 2012-09-08 | Discharge: 2012-09-08 | Disposition: A | Discharge status: Home / Self Care | Payer: PRIVATE HEALTH INSURANCE | Source: Home / Self Care | Attending: Emergency Medicine | Admitting: Emergency Medicine

## 2012-09-08 ENCOUNTER — Encounter (HOSPITAL_COMMUNITY): Payer: Self-pay | Admitting: Emergency Medicine

## 2012-09-08 DIAGNOSIS — E1165 Type 2 diabetes mellitus with hyperglycemia: Secondary | ICD-10-CM

## 2012-09-08 DIAGNOSIS — K5289 Other specified noninfective gastroenteritis and colitis: Secondary | ICD-10-CM

## 2012-09-08 DIAGNOSIS — K529 Noninfective gastroenteritis and colitis, unspecified: Secondary | ICD-10-CM

## 2012-09-08 DIAGNOSIS — I10 Essential (primary) hypertension: Secondary | ICD-10-CM

## 2012-09-08 DIAGNOSIS — E876 Hypokalemia: Secondary | ICD-10-CM

## 2012-09-08 DIAGNOSIS — IMO0002 Reserved for concepts with insufficient information to code with codable children: Secondary | ICD-10-CM

## 2012-09-08 HISTORY — DX: Essential (primary) hypertension: I10

## 2012-09-08 HISTORY — DX: Type 2 diabetes mellitus without complications: E11.9

## 2012-09-08 LAB — POCT I-STAT, CHEM 8
BUN: 10 mg/dL (ref 6–23)
Calcium, Ion: 1.21 mmol/L (ref 1.12–1.23)
Chloride: 98 mEq/L (ref 96–112)
Creatinine, Ser: 0.6 mg/dL (ref 0.50–1.10)
Glucose, Bld: 242 mg/dL — ABNORMAL HIGH (ref 70–99)
HCT: 43 % (ref 36.0–46.0)
Hemoglobin: 14.6 g/dL (ref 12.0–15.0)
Potassium: 3 mEq/L — ABNORMAL LOW (ref 3.5–5.1)
Sodium: 140 mEq/L (ref 135–145)
TCO2: 30 mmol/L (ref 0–100)

## 2012-09-08 LAB — POCT H PYLORI SCREEN: H. PYLORI SCREEN, POC: NEGATIVE

## 2012-09-08 MED ORDER — GLYBURIDE 2.5 MG PO TABS: 2.5000 mg | ORAL_TABLET | Freq: Two times a day (BID) | ORAL | Status: DC

## 2012-09-08 MED ORDER — POTASSIUM CHLORIDE ER 10 MEQ PO TBCR: 10.0000 meq | EXTENDED_RELEASE_TABLET | Freq: Every day | ORAL | Status: DC

## 2012-09-08 MED ORDER — METFORMIN HCL 500 MG PO TABS: 500.0000 mg | ORAL_TABLET | Freq: Two times a day (BID) | ORAL | Status: DC

## 2012-09-08 MED ORDER — ONDANSETRON 4 MG PO TBDP
ORAL_TABLET | ORAL | Status: AC
  Filled 2012-09-08: qty 2

## 2012-09-08 MED ORDER — ONDANSETRON 8 MG PO TBDP: 8.0000 mg | ORAL_TABLET | Freq: Three times a day (TID) | ORAL | Status: DC | PRN

## 2012-09-08 MED ORDER — OMEPRAZOLE 20 MG PO CPDR: 20.0000 mg | DELAYED_RELEASE_CAPSULE | Freq: Two times a day (BID) | ORAL | Status: DC

## 2012-09-08 MED ORDER — LISINOPRIL-HYDROCHLOROTHIAZIDE 20-25 MG PO TABS: 1.0000 | ORAL_TABLET | Freq: Every day | ORAL | Status: DC

## 2012-09-08 MED ORDER — ONDANSETRON 4 MG PO TBDP
8.0000 mg | ORAL_TABLET | Freq: Once | ORAL | Status: AC
  Administered 2012-09-08: 8 mg via ORAL

## 2012-09-08 NOTE — ED Provider Notes (Signed)
Chief Complaint:   Chief Complaint  Patient presents with  . URI    History of Present Illness:   Tina Horton is a 47 year old female who presents with multiple symptoms today including nausea and vomiting, nasal congestion, myalgias, ear pain, and followup on hypertension and diabetes. The patient states that for the past 10 days she's had nausea and vomiting of all by mouth intake, mild epigastric pain but no diarrhea. She denies any fever or chills. There's been no blood in the vomitus, coffee-ground emesis, or bilious emesis. She notes nasal congestion with bloody drainage, headache, and sinus pressure. She feels achy in various joints including the right shoulder, right hip, back, and right leg. Both ears hurt. She had a scratchy throat and a slight cough. She's had blood pressure for about 12 years and is not taking any medication for right now. She's also had diabetes for 12 years and is not taking any medications. She's had some dry mouth no polyuria or polydipsia.  Review of Systems:  Other than noted above, the patient denies any of the following symptoms. Systemic:  No fever, chills, sweats, fatigue, myalgias, headache, or anorexia. Eye:  No redness, pain or drainage. ENT:  No earache, nasal congestion, rhinorrhea, sinus pressure, or sore throat. Lungs:  No cough, sputum production, wheezing, shortness of breath.  Cardiovascular:  No chest pain, palpitations, or syncope. GI:  No nausea, vomiting, abdominal pain or diarrhea. GU:  No dysuria, frequency, or hematuria. Skin:  No rash or pruritis.  [employer]:  Past medical history, family history, social history, meds, and allergies were reviewed.  She takes eye drops for glaucoma but no other medication.  Physical Exam:   Vital signs:  BP 185/98  Pulse 88  Temp(Src) 97.4 F (36.3 C) (Oral)  SpO2 93%  LMP 09/02/2012 General:  Alert, in no distress. Eye:  PERRL, full EOMs.  Lids and conjunctivas were normal. ENT:  TMs and canals  were normal, without erythema or inflammation.  Nasal mucosa was clear and uncongested, without drainage.  Mucous membranes were moist.  Pharynx was clear, without exudate or drainage.  There were no oral ulcerations or lesions. Neck:  Supple, no adenopathy, tenderness or mass. Thyroid was normal. Lungs:  No respiratory distress.  Lungs were clear to auscultation, without wheezes, rales or rhonchi.  Breath sounds were clear and equal bilaterally. Heart:  Regular rhythm, without gallops, murmers or rubs. Abdomen:  Soft, flat, and non-tender to palpation.  No hepatosplenomagaly or mass. Skin:  Clear, warm, and dry, without rash or lesions.  Labs:   Results for orders placed during the hospital encounter of 09/08/12  POCT I-STAT, CHEM 8      Result Value Range   Sodium 140  135 - 145 mEq/L   Potassium 3.0 (*) 3.5 - 5.1 mEq/L   Chloride 98  96 - 112 mEq/L   BUN 10  6 - 23 mg/dL   Creatinine, Ser 0.60  0.50 - 1.10 mg/dL   Glucose, Bld 242 (*) 70 - 99 mg/dL   Calcium, Ion 1.21  1.12 - 1.23 mmol/L   TCO2 30  0 - 100 mmol/L   Hemoglobin 14.6  12.0 - 15.0 g/dL   HCT 43.0  36.0 - 46.0 %  POCT H PYLORI SCREEN      Result Value Range   H. PYLORI SCREEN, POC NEGATIVE  NEGATIVE    Course in Urgent Care Center:   Given Zofran ODT 8 mg by mouth, and thereafter she tolerated by  mouth liquids well.  Assessment:  The primary encounter diagnosis was Gastroenteritis. Diagnoses of DIABETES MELLITUS, II, COMPLICATIONS, HYPERTENSION, BENIGN SYSTEMIC, and Hypokalemia were also pertinent to this visit.  Needs followup and was referred to adult care clinic.  Plan:   1.  The following meds were prescribed:   Discharge Medication List as of 09/08/2012  1:11 PM    START taking these medications   Details  !! glyBURIDE (DIABETA) 2.5 MG tablet Take 1 tablet (2.5 mg total) by mouth 2 (two) times daily before a meal., Starting 09/08/2012, Until Discontinued, Normal    !! lisinopril-hydrochlorothiazide  (PRINZIDE,ZESTORETIC) 20-25 MG per tablet Take 1 tablet by mouth daily., Starting 09/08/2012, Until Discontinued, Normal    !! metFORMIN (GLUCOPHAGE) 500 MG tablet Take 1 tablet (500 mg total) by mouth 2 (two) times daily with a meal., Starting 09/08/2012, Until Discontinued, Normal    omeprazole (PRILOSEC) 20 MG capsule Take 1 capsule (20 mg total) by mouth 2 (two) times daily before a meal., Starting 09/08/2012, Until Discontinued, Normal    ondansetron (ZOFRAN ODT) 8 MG disintegrating tablet Take 1 tablet (8 mg total) by mouth every 8 (eight) hours as needed for nausea., Starting 09/08/2012, Until Discontinued, Normal    potassium chloride (K-DUR) 10 MEQ tablet Take 1 tablet (10 mEq total) by mouth daily., Starting 09/08/2012, Until Discontinued, Normal     !! - Potential duplicate medications found. Please discuss with provider.     2.  The patient was instructed in symptomatic care and handouts were given. 3.  The patient was told to return if becoming worse in any way, if no better in 3 or 4 days, and given some red flag symptoms such as fever, worsening pain or persistent vomiting that would indicate earlier return.  Follow up:  The patient was told to follow up with adult care clinic as soon as possible.     Harden Mo, MD 09/08/12 2147

## 2012-09-08 NOTE — ED Notes (Signed)
Pt c/o congestion x 2 weeks with generalized body aches. No fever. Has had emesis x 1 occasion. Feels pain in her ears and eyes. Throat feels itchy. Has been drinking plenty of fluids and eating soup with no relief.

## 2012-09-28 ENCOUNTER — Ambulatory Visit (INDEPENDENT_AMBULATORY_CARE_PROVIDER_SITE_OTHER): Payer: No Typology Code available for payment source | Admitting: Medical

## 2012-09-28 ENCOUNTER — Encounter: Payer: Self-pay | Admitting: Medical

## 2012-09-28 VITALS — BP 152/90 | HR 82 | Temp 98.1°F | Resp 16 | Ht 61.0 in | Wt 146.0 lb

## 2012-09-28 DIAGNOSIS — R011 Cardiac murmur, unspecified: Secondary | ICD-10-CM

## 2012-09-28 DIAGNOSIS — Z0000 Encounter for general adult medical examination without abnormal findings: Secondary | ICD-10-CM

## 2012-09-28 DIAGNOSIS — I10 Essential (primary) hypertension: Secondary | ICD-10-CM

## 2012-09-28 DIAGNOSIS — E119 Type 2 diabetes mellitus without complications: Secondary | ICD-10-CM

## 2012-09-28 DIAGNOSIS — Z23 Encounter for immunization: Secondary | ICD-10-CM

## 2012-09-28 DIAGNOSIS — E785 Hyperlipidemia, unspecified: Secondary | ICD-10-CM

## 2012-09-28 DIAGNOSIS — E876 Hypokalemia: Secondary | ICD-10-CM

## 2012-09-28 LAB — CBC WITH DIFFERENTIAL/PLATELET
Basophils Absolute: 0 10*3/uL (ref 0.0–0.1)
Basophils Relative: 0 % (ref 0–1)
Eosinophils Absolute: 0.1 10*3/uL (ref 0.0–0.7)
Eosinophils Relative: 2 % (ref 0–5)
HCT: 37.7 % (ref 36.0–46.0)
Hemoglobin: 12.7 g/dL (ref 12.0–15.0)
Lymphocytes Relative: 36 % (ref 12–46)
Lymphs Abs: 2.1 10*3/uL (ref 0.7–4.0)
MCH: 28.9 pg (ref 26.0–34.0)
MCHC: 33.7 g/dL (ref 30.0–36.0)
MCV: 85.7 fL (ref 78.0–100.0)
Monocytes Absolute: 0.4 10*3/uL (ref 0.1–1.0)
Monocytes Relative: 7 % (ref 3–12)
Neutro Abs: 3.1 10*3/uL (ref 1.7–7.7)
Neutrophils Relative %: 55 % (ref 43–77)
Platelets: 294 10*3/uL (ref 150–400)
RBC: 4.4 MIL/uL (ref 3.87–5.11)
RDW: 12.5 % (ref 11.5–15.5)
WBC: 5.7 10*3/uL (ref 4.0–10.5)

## 2012-09-28 LAB — POCT URINALYSIS DIPSTICK
Bilirubin, UA: NEGATIVE
Glucose, UA: 500
Leukocytes, UA: NEGATIVE
Nitrite, UA: NEGATIVE
Spec Grav, UA: 1.015
Urobilinogen, UA: 0.2
pH, UA: 5

## 2012-09-28 NOTE — Progress Notes (Signed)
Subjective:   HPI  Tina Horton is a 47 y.o. female who presents for a complete physical.  New patient today.  establishing along with her husband due to insurance no longer accepted at prior [facility].    Preventative care: Last ophthalmology visit:yes- Dr. Einar Gip Last dental visit: over a year ago Last colonoscopy:n/a Last mammogram:2005 Last gynecological exam:2006 Last MG:[id number]  Last labs:n/a  Prior vaccinations: TD or Tdap:02/1998 Influenza:never Pneumococcal:n/a Shingles/Zostavax:n/a  Advanced directive:n/a Health care power of attorney:n/a Living will:n/a  Concerns: DM type II - was prescribed metformin and glyburide.  Not taking these, wants to control with diet and exercise.  Took those medications a few years but stopped due to side effects.  Not checking glucose.  Last HgbA1C 7.3 in 2011.  HTN - compliant with BP medication, but was changed to Lisinopril HCT recently at urgent care after her prior medication was working.  Was seen at urgent care for nausea.  Glaucoma - due for f/u with Dr. Einar Gip.    She reports nasal congestion, nausea, coughing up mucous, no fever, no sore throat, no SOB.  Has some sinus headache.   Right hip and knee pains x 6 years.   Takes Advil liquid gels occasionally.   She also reports right shoulder pain.  At urgent care was found to have hypokalemia - taking OTC potassium.   Reviewed their medical, surgical, family, social, medication, and allergy history and updated chart as appropriate.   Past Medical History  Diagnosis Date  . Hypertension   . Myopia   . Glaucoma     Dr. Einar Gip  . GERD (gastroesophageal reflux disease)   . H/O mammogram 2005  . Routine gynecological examination     last pap 2005  . Hyperlipidemia   . Diabetes mellitus without complication     age [id number]     Past Surgical History  Procedure Laterality Date  . Lasik      x2  . Incision and drainage      abdominal superficial abscess     Family History  Problem Relation Age of Onset  . Diabetes Mother   . Hypertension Mother   . Hypertension Father   . Hypertension Sister   . Hypertension Brother   . Stroke Maternal Grandmother   . Cancer Neg Hx   . Heart disease Neg Hx     History   Social History  . Marital Status: Married    Spouse Name: N/A    Number of Children: N/A  . Years of Education: N/A   Occupational History  . Not on file.   Social History Main Topics  . Smoking status: Never Smoker   . Smokeless tobacco: Not on file  . Alcohol Use: No  . Drug Use: No  . Sexually Active: Not on file   Other Topics Concern  . Not on file   Social History Narrative   Married, has 3 children, not exercising, but walks at work, works at [employer] Prescriptions on File Prior to Visit  Medication Sig Dispense Refill  . aspirin (BAY[employer] CHILDRENS ASPIRIN) 81 MG chewable tablet Chew 81 mg by mouth daily.        [name] lisinopril-hydrochlorothiazide (PRINZIDE,ZESTORETIC) 20-25 MG per tablet TAKE ONE TABLET BY MOUTH EVERY DAY  30 tablet  1  . omeprazole (PRILOSEC) 20 MG capsule Take 1 capsule (20 mg total) by mouth 2 (two) times daily before a meal.  60 capsule  0  . glyBURIDE (DIABETA) 2.5  MG tablet Take 1 tablet (2.5 mg total) by mouth 2 (two) times daily before a meal.  60 tablet  2  . metFORMIN (GLUCOPHAGE) 500 MG tablet Take 1 tablet (500 mg total) by mouth 2 (two) times daily with a meal.  60 tablet  2  . ondansetron (ZOFRAN ODT) 8 MG disintegrating tablet Take 1 tablet (8 mg total) by mouth every 8 (eight) hours as needed for nausea.  20 tablet  0   No current facility-administered medications on file prior to visit.    No Known Allergies    Review of Systems Constitutional: -fever, -chills, -sweats, -unexpected weight change, -decreased appetite, -fatigue Allergy: -sneezing, +itching, +congestion Dermatology: -changing moles, --rash, -lumps ENT: -runny nose, -ear pain, -sore  throat, -hoarseness, +sinus pain, -teeth pain, - ringing in ears, -hearing loss, -nosebleeds Cardiology: -chest pain, -palpitations, -swelling, -difficulty breathing when lying flat, -waking up short of breath Respiratory: -cough, -shortness of breath, -difficulty breathing with exercise or exertion, -wheezing, -coughing up blood Gastroenterology: -abdominal pain, -nausea, -vomiting, -diarrhea, -constipation, -blood in stool, -changes in bowel movement, -difficulty swallowing or eating Hematology: -bleeding, -bruising  Musculoskeletal: +joint aches, -muscle aches, -joint swelling, -back pain, -neck pain, -cramping, -changes in gait Ophthalmology: denies vision changes, eye redness, itching, discharge Urology: -burning with urination, -difficulty urinating, -blood in urine, -urinary frequency, -urgency, -incontinence Neurology: +headache, -weakness, -tingling, +numbness, -memory loss, -falls, -dizziness Psychology: -depressed mood, -agitation, -sleep problems     Objective:   Physical Exam  Filed Vitals:   09/28/12 0928  BP: 152/90  Pulse: 82  Temp: 98.1 F (36.7 C)  Resp: 16    General appearance: alert, no distress, WD/WN, AA female Skin: scattered benign appearing lesions, no worrisome lesions HEENT: normocephalic, conjunctiva/corneas normal, sclerae anicteric, PERRLA, EOMi, nares patent, no discharge or erythema, pharynx normal Oral cavity: MMM, tongue normal, teeth in good repair Neck: supple, no lymphadenopathy, no thyromegaly, no masses, normal ROM, no bruits Chest: non tender, normal shape and expansion Heart: RRR, faint [id number] brief systolic murmur heard only in left and right upper sternal borders, otherwise no gallop or rubs, normal S2 Lungs: CTA bilaterally, no wheezes, rhonchi, or rales Abdomen: +bs, soft, superior to umbilicus with small surgical scar, mild generalized tenderness, non distended, no masses, no hepatomegaly, no splenomegaly, no bruits Back: non tender,  normal ROM, no scoliosis Musculoskeletal: upper extremities non tender, no obvious deformity, normal ROM throughout, lower extremities non tender, no obvious deformity, normal ROM throughout Extremities: no edema, no cyanosis, no clubbing Pulses: 2+ symmetric, upper and lower extremities, normal cap refill Neurological: alert, oriented x 3, CN2-12 intact, strength normal upper extremities and lower extremities, sensation normal throughout, DTRs 2+ throughout, no cerebellar signs, gait normal, monofilament exam normal Psychiatric: normal affect, behavior normal, pleasant  Breast/gyn/rectal - deferred today    Adult ECG Report  Indication: physical, faint murmur, diabetic  Rate: 83 bpm  Rhythm: normal sinus rhythm  QRS Axis: 9 degrees  PR Interval: 160 ms  QRS Duration: 84 ms  QTc: 444 ms  Conduction Disturbances: none  Other Abnormalities: none  Patient's cardiac risk factors are: diabetes mellitus and hypertension.  EKG comparison: none  Narrative Interpretation: normal EKG     Assessment and Plan :    Encounter Diagnoses  Name Primary?  . Routine general medical examination at a health care facility Yes  . Type II or unspecified type diabetes mellitus without mention of complication, not stated as uncontrolled   . Essential hypertension, benign   . Hyperlipidemia   .  Hypokalemia   . Need for Tdap vaccination   . Heart murmur      Physical exam - discussed healthy lifestyle, diet, exercise, preventative care, vaccinations, and addressed their concerns.  Handout given.  Baseline labs.  C/t current medications.  Advised of goals of diabetes care, risks of diabetes, complications, goals with diet ,exercise, and will likely need to have her return for medication changes, as I suspect her numbers to be abnormal, not at goal.  Advised OTC mucinex DM and increased water intake for congestion symptoms.  Advised OTC NSAID prn for hip and knee pain, likely right hip bursitis and possible  arthritis of right knee vs patellar tracking issue.  Advised she return to discuss the other concern and breast/pelvic given the limited time we had today.  Of note, she declined pneumococcal and influenza vaccine recommendations.

## 2012-09-29 LAB — LIPID PANEL
Cholesterol: 194 mg/dL (ref 0–200)
HDL: 39 mg/dL — ABNORMAL LOW (ref >39)
LDL Cholesterol: 140 mg/dL — ABNORMAL HIGH (ref 0–99)
Total CHOL/HDL Ratio: 5 Ratio
Triglycerides: 76 mg/dL (ref <150)
VLDL: 15 mg/dL (ref 0–40)

## 2012-09-29 LAB — COMPREHENSIVE METABOLIC PANEL
ALT: 12 U/L (ref 0–35)
AST: 14 U/L (ref 0–37)
Albumin: 4.6 g/dL (ref 3.5–5.2)
Alkaline Phosphatase: 71 U/L (ref 39–117)
BUN: 22 mg/dL (ref 6–23)
CO2: 30 mEq/L (ref 19–32)
Calcium: 10.4 mg/dL (ref 8.4–10.5)
Chloride: 94 mEq/L — ABNORMAL LOW (ref 96–112)
Creat: 0.85 mg/dL (ref 0.50–1.10)
Glucose, Bld: 297 mg/dL — ABNORMAL HIGH (ref 70–99)
Potassium: 3.4 mEq/L — ABNORMAL LOW (ref 3.5–5.3)
Sodium: 135 mEq/L (ref 135–145)
Total Bilirubin: 0.7 mg/dL (ref 0.3–1.2)
Total Protein: 7.6 g/dL (ref 6.0–8.3)

## 2012-09-29 LAB — MICROALBUMIN / CREATININE URINE RATIO
Creatinine, Urine: 125.1 mg/dL
Microalb Creat Ratio: 23.1 mg/g (ref 0.0–30.0)
Microalb, Ur: 2.89 mg/dL — ABNORMAL HIGH (ref 0.00–1.89)

## 2012-09-29 LAB — HEMOGLOBIN A1C
Hgb A1c MFr Bld: 10.7 % — ABNORMAL HIGH (ref <5.7)
Mean Plasma Glucose: 260 mg/dL — ABNORMAL HIGH (ref <117)

## 2012-10-01 ENCOUNTER — Telehealth: Payer: Self-pay | Admitting: Medical

## 2012-10-01 NOTE — Telephone Encounter (Signed)
Pt wants to continue being a patient of [facility]"s, she just will get a GYN for Female exams.

## 2012-10-05 ENCOUNTER — Institutional Professional Consult (permissible substitution): Payer: No Typology Code available for payment source | Admitting: Medical

## 2012-10-06 ENCOUNTER — Ambulatory Visit (INDEPENDENT_AMBULATORY_CARE_PROVIDER_SITE_OTHER): Payer: No Typology Code available for payment source | Admitting: Medical

## 2012-10-06 ENCOUNTER — Encounter: Payer: Self-pay | Admitting: Medical

## 2012-10-06 VITALS — BP 118/80 | HR 80 | Temp 98.3°F | Resp 16 | Wt 145.0 lb

## 2012-10-06 DIAGNOSIS — I10 Essential (primary) hypertension: Secondary | ICD-10-CM

## 2012-10-06 DIAGNOSIS — E785 Hyperlipidemia, unspecified: Secondary | ICD-10-CM

## 2012-10-06 DIAGNOSIS — IMO0001 Reserved for inherently not codable concepts without codable children: Secondary | ICD-10-CM

## 2012-10-06 DIAGNOSIS — E786 Lipoprotein deficiency: Secondary | ICD-10-CM

## 2012-10-06 MED ORDER — ALOGLIPTIN-PIOGLITAZONE 25-30 MG PO TABS: 1.0000 | ORAL_TABLET | Freq: Every day | ORAL | Status: DC

## 2012-10-06 NOTE — Patient Instructions (Addendum)
Begin Oseni once daily.  This will help to make insulin work better.  Diabetic diet recommendations  Need to eat low fat, low carbohydrate diet  Eat lean meats such as chicken, fish, [place] in small 3-4 oz quantities  Eat 3-5 fruits daily  Eat beans, about 1 cup daily  Eat all the non-potato vegetables you want daily  Limit carbs such as rice, cereal, bread, oatmeal, and other grains, 1/2 cup or 2 slices of bread per serving  Avoid ice cream, candy, cakes, fried foods, fatty foods  Avoid sweet tea and soda   Regarding cholesterol.  Your HDL good cholesterol is low, your LDL bad cholesterol is too high.  Cut out red meat, processed meats, lots of cheese and ice cream  DO include garlic, cinnamon and whole grains in your diet  We will recheck you cholesterol in 3 months.  If not improved, we will have to consider beginning cholesterol lowering medication  Continue exercise regularly  See your eye doctor soon  Recheck here in 4-6 weeks regarding Oseni medication and glucose.

## 2012-10-06 NOTE — Progress Notes (Signed)
Subjective:    Tina Horton is a 46 y.o. female who presents for follow-up from recent physical and labs.  Diagnosed with diabetes type II about 10 years ago. Initially on metformin, but due to nausea was changed to metformin + glyburide, but she felt like this was causing her URI symptoms, so stopped this.  At one point she has had hypoglycemia in the past with glyburide.  Of note, hx/o some yeast infections.  Has only had 1 UTI prior.  No hx/o treatment for high cholesterol. Home blood sugar records: 122-144  Current symptoms/problems include visual disturbances and vomitting and have been improving. Daily foot checks, foot concerns: none Last eye exam: last year Medication compliance: Current diet: in general, a "healthy" diet  , diabetic Current exercise: walking  Known diabetic complications: eye problems Cardiovascular risk factors: diabetes mellitus and hypertension  HTN - compliant with medication, no c/o.   The following portions of the patient's history were reviewed and updated as appropriate: allergies, current medications, past family history, past medical history, past social history, past surgical history and problem list.  ROS as in subjective above    Objective:    BP 118/80  Pulse 80  Temp(Src) 98.3 F (36.8 C) (Oral)  Resp 16  Wt 145 lb (65.772 kg)  BMI 27.41 kg/m2  LMP 09/25/2012  Filed Vitals:   10/06/12 1423  BP: 118/80  Pulse: 80  Temp: 98.3 F (36.8 C)  Resp: 16    General appearance: alert, no distress, WD/WN    Lab Review Lab Results  Component Value Date   HGBA1C 10.7* 09/28/2012   Lab Results  Component Value Date   CHOL 194 09/28/2012   HDL 39* 09/28/2012   LDLCALC 140* 09/28/2012   TRIG 76 09/28/2012   CHOLHDL 5.0 09/28/2012   Lab Results  Component Value Date   MICROALBUR 2.89* 09/28/2012     Chemistry      Component Value Date/Time   NA 135 09/28/2012 1115   K 3.4* 09/28/2012 1115   CL 94* 09/28/2012 1115   CO2 30  09/28/2012 1115   BUN 22 09/28/2012 1115   CREATININE 0.85 09/28/2012 1115   CREATININE 0.60 09/08/2012 1228      Component Value Date/Time   CALCIUM 10.4 09/28/2012 1115   ALKPHOS 71 09/28/2012 1115   AST 14 09/28/2012 1115   ALT 12 09/28/2012 1115   BILITOT 0.7 09/28/2012 1115        Chemistry      Component Value Date/Time   NA 135 09/28/2012 1115   K 3.4* 09/28/2012 1115   CL 94* 09/28/2012 1115   CO2 30 09/28/2012 1115   BUN 22 09/28/2012 1115   CREATININE 0.85 09/28/2012 1115   CREATININE 0.60 09/08/2012 1228      Component Value Date/Time   CALCIUM 10.4 09/28/2012 1115   ALKPHOS 71 09/28/2012 1115   AST 14 09/28/2012 1115   ALT 12 09/28/2012 1115   BILITOT 0.7 09/28/2012 1115       Assessment:   Encounter Diagnoses  Name Primary?  . Type II or unspecified type diabetes mellitus without mention of complication, uncontrolled Yes  . Hyperlipidemia   . Low HDL (under 40)   . Essential hypertension, benign      Plan:   Given prior failure adverse effects of metformin,glyburide, and metformin glyburide combo, we will begin Oseni.  Given her eye sight, not sure she would do well with injectable medication.   Given hx/o yeast infections, will  hold off on invokana and similar for now.   Patient Instructions  Begin Oseni once daily.  This will help to make insulin work better.  Diabetic diet recommendations  Need to eat low fat, low carbohydrate diet  Eat lean meats such as chicken, fish, [place] in small 3-4 oz quantities  Eat 3-5 fruits daily  Eat beans, about 1 cup daily  Eat all the non-potato vegetables you want daily  Limit carbs such as rice, cereal, bread, oatmeal, and other grains, 1/2 cup or 2 slices of bread per serving  Avoid ice cream, candy, cakes, fried foods, fatty foods  Avoid sweet tea and soda   Regarding cholesterol.  Your HDL good cholesterol is low, your LDL bad cholesterol is too high.  Cut out red meat, processed meats, lots of cheese and ice  cream  DO include garlic, cinnamon and whole grains in your diet  We will recheck you cholesterol in 3 months.  If not improved, we will have to consider beginning cholesterol lowering medication  Continue exercise regularly  See your eye doctor soon  Recheck here in 4-6 weeks regarding Oseni medication and glucose.

## 2012-10-19 ENCOUNTER — Telehealth: Payer: Self-pay | Admitting: Internal Medicine

## 2012-10-19 NOTE — Telephone Encounter (Signed)
I fax the RX to her pharmacy. CLS

## 2012-10-19 NOTE — Telephone Encounter (Signed)
Pt needs a rx sent in for prodigy meter, test strips which she test once a year, and then also lancets to [place]

## 2012-10-28 ENCOUNTER — Other Ambulatory Visit: Payer: Self-pay | Admitting: Medical

## 2012-10-28 ENCOUNTER — Telehealth: Payer: Self-pay | Admitting: Medical

## 2012-10-28 MED ORDER — METFORMIN HCL 500 MG PO TABS: 500.0000 mg | ORAL_TABLET | Freq: Every day | ORAL | Status: DC

## 2012-10-28 MED ORDER — LISINOPRIL-HYDROCHLOROTHIAZIDE 20-25 MG PO TABS: 1.0000 | ORAL_TABLET | Freq: Every day | ORAL | Status: DC

## 2012-10-28 NOTE — Telephone Encounter (Signed)
Tina Horton, patient states that she stop taking Oseni because it made her sugars increase and wants to go back to Metformin and Glybride, so what should I do. CLS

## 2012-10-28 NOTE — Telephone Encounter (Signed)
C/t Oseni, but lets add back once daily metformin, recheck 2-3 wk.  Have her check glucose before each meal the next 2 wk, and bring numbers when she comes in.

## 2012-10-29 NOTE — Telephone Encounter (Signed)
Pt stated that her husband was laid off and her insurance goes out Sunday she agreed to the med change and testing she is worried of how much it will cost for the next visit

## 2012-10-30 NOTE — Telephone Encounter (Signed)
LMOM TO CB. CLS 

## 2012-10-30 NOTE — Telephone Encounter (Signed)
Does this mean her insurance will stop as well?  I would add back the metformin, however, have her go ahead and contact pharmacy about patient assistant plan regarding Oseni.  I would assume that if she loses her insurance, the Oseni will not be affordable and we'll have to make other changes.

## 2012-11-02 NOTE — Telephone Encounter (Signed)
I understand.  Does she have plenty of both medications?   Have her check glucose preferably TID and get me numbers in a week to see how she is doing on this regimen.

## 2012-11-02 NOTE — Telephone Encounter (Signed)
Patient is aware and she understood and she will get the sugar numbers to you in 1 week. CLS

## 2012-11-02 NOTE — Telephone Encounter (Signed)
Patient states that she stop the Oseni. She said she took it for 3 days and her sugars did not go down she said that they were increasing. She said she started back on the Metformin and Glybride and her sugars are good while on those two medications. She said that her and her husband insurance did end and the Oseni cost to much. CLS

## 2013-04-20 ENCOUNTER — Other Ambulatory Visit: Payer: Self-pay | Admitting: Family Medicine

## 2013-04-20 ENCOUNTER — Telehealth: Payer: Self-pay | Admitting: Medical

## 2013-04-20 MED ORDER — METFORMIN HCL 500 MG PO TABS: 500.0000 mg | ORAL_TABLET | Freq: Every day | ORAL | Status: DC

## 2013-04-20 NOTE — Telephone Encounter (Signed)
RX REFILL SENT . CLS

## 2013-05-14 ENCOUNTER — Other Ambulatory Visit: Payer: Self-pay | Admitting: Medical

## 2013-05-17 ENCOUNTER — Telehealth: Payer: Self-pay | Admitting: Medical

## 2013-05-17 NOTE — Telephone Encounter (Signed)
Received fax refill request from [facility]25 #[id number]  Last filled 02/19/13

## 2013-05-17 NOTE — Telephone Encounter (Signed)
This one is yours

## 2013-05-17 NOTE — Telephone Encounter (Signed)
Needs appt, can send 90 day on BP med without refill.  Due at this time for f/u on diabetes

## 2013-05-18 ENCOUNTER — Other Ambulatory Visit: Payer: Self-pay | Admitting: Family Medicine

## 2013-05-18 MED ORDER — LISINOPRIL-HYDROCHLOROTHIAZIDE 20-25 MG PO TABS: 1.0000 | ORAL_TABLET | Freq: Every day | ORAL | Status: DC

## 2013-05-18 NOTE — Telephone Encounter (Signed)
Patient is aware and a refill was sent and she is aware that she needs a OV. CLS

## 2013-05-20 ENCOUNTER — Other Ambulatory Visit: Payer: Self-pay | Admitting: Medical

## 2013-06-09 ENCOUNTER — Encounter: Payer: Self-pay | Admitting: Medical

## 2013-06-09 ENCOUNTER — Ambulatory Visit (INDEPENDENT_AMBULATORY_CARE_PROVIDER_SITE_OTHER): Payer: PRIVATE HEALTH INSURANCE | Admitting: Medical

## 2013-06-09 VITALS — BP 146/90 | HR 86 | Temp 98.2°F | Resp 16 | Ht 61.0 in | Wt 151.0 lb

## 2013-06-09 DIAGNOSIS — I10 Essential (primary) hypertension: Secondary | ICD-10-CM

## 2013-06-09 DIAGNOSIS — H409 Unspecified glaucoma: Secondary | ICD-10-CM

## 2013-06-09 DIAGNOSIS — E785 Hyperlipidemia, unspecified: Secondary | ICD-10-CM

## 2013-06-09 DIAGNOSIS — Z91199 Patient's noncompliance with other medical treatment and regimen due to unspecified reason: Secondary | ICD-10-CM

## 2013-06-09 DIAGNOSIS — E119 Type 2 diabetes mellitus without complications: Secondary | ICD-10-CM

## 2013-06-09 DIAGNOSIS — Z9119 Patient's noncompliance with other medical treatment and regimen: Secondary | ICD-10-CM

## 2013-06-09 LAB — POCT GLYCOSYLATED HEMOGLOBIN (HGB A1C): Hemoglobin A1C: 10.6

## 2013-06-09 MED ORDER — METFORMIN HCL ER 500 MG PO TB24: ORAL_TABLET | ORAL | Status: DC

## 2013-06-09 MED ORDER — LISINOPRIL-HYDROCHLOROTHIAZIDE 20-25 MG PO TABS: 1.0000 | ORAL_TABLET | Freq: Every day | ORAL | Status: DC

## 2013-06-09 MED ORDER — EXENATIDE ER 2 MG ~~LOC~~ PEN: 2.0000 mg | PEN_INJECTOR | SUBCUTANEOUS | Status: DC

## 2013-06-09 NOTE — Progress Notes (Signed)
                                                             Subjective:  Tina Horton is a 48 y.o. female who presents for followup on diabetes. At last visit her hemoglobin A1c was 10.7%. At that time we made some modifications, however she didn't tolerate the Oseni or Metofmrin or Glipizide. Thus, she is currently only taking metformin 500 once daily.  She checks her sugars periodically not daily. She is compliant with her blood pressure medicine. She is not on cholesterol medication. She notes recently having a gurgling feeling in her chest, thought it was chest pain, however no shortness of breath, no sweats, no dizziness, no nausea vomiting, no tingling, and after cutting out caffeine this has resolved. No other new complaints.    The following portions of the patient's history were reviewed and updated as appropriate: allergies, current medications, past family history, past medical history, past social history, past surgical history and problem list.  ROS Otherwise as in subjective above  Objective: Physical Exam  BP 146/90  Pulse 86  Temp(Src) 98.2 F (36.8 C)  Resp 16  Ht 5\' 1"  (1.549 m)  Wt 151 lb (68.493 kg)  BMI 28.55 kg/m2  SpO2 98%   General appearance: alert, no distress, WD/WN Neck: supple, no lymphadenopathy, no thyromegaly, no masses Heart: RRR, normal S1, S2, no murmurs Lungs: CTA bilaterally, no wheezes, rhonchi, or rales Abdomen: +bs, soft, non tender, non distended, no masses, no hepatomegaly, no splenomegaly Pulses: 2+ radial pulses, 2+ pedal pulses, normal cap refill Ext: no edema   Assessment: Encounter Diagnoses  Name Primary?  . Type II or unspecified type diabetes mellitus without mention of complication, not stated as uncontrolled Yes  . Hyperlipidemia   . Essential hypertension, benign   . Noncompliance   . Glaucoma     Plan: Diabetes type 2-we have had problems with both compliance, medication side effects, and she has difficulty with  vision given her glaucoma. We will change to metformin ER 500 mg 2 tablets daily. After discussing options, she will begin Bydureon once weekly injection, and her husband will do the injection.  They will return together Friday for demonstration of the medication/injection. Advise she check her sugar daily, discussed diet, exercise. Followup in one month otherwise Hypertension-continue same medication Hyperlipidemia-not at goal, not on statin, but given the complications were having already with medication compliance, we will hold off on statin for the time being Follow up: this week for nurse demo on Bydureon, then recheck 23mo.

## 2013-06-11 ENCOUNTER — Other Ambulatory Visit: Payer: PRIVATE HEALTH INSURANCE

## 2013-06-22 ENCOUNTER — Telehealth: Payer: Self-pay | Admitting: Family Medicine

## 2013-06-22 NOTE — Telephone Encounter (Signed)
Pt called and states she cannot afford the Bydeuron that it is over $500.00 can you order her something cheaper.  Also the Metformin ER does she take 2 pills at a time or can she take on in am and one in pm.  You can leave her a message on her voice mail [phone].      Pt pharm [facility] [place].

## 2013-06-23 NOTE — Telephone Encounter (Signed)
Tina Horton - She should be taking Metformin /Glucophage ER 500mg , 2 tablets daily to equal 1000mg  daily.  Can take both tablets together every morning.   We specifically wanted to use Bydureon as her vision is poor, and this is once weekly option which would work way better for her   Tina Horton - can you find out, a) did she use/activate coupon card, b) if prior auth, lets fight for this as the other options require daily injection which will be way harder with her vision.  If bydureon won't work, our other options are Victoza, Tanzeum, or a similar drug option as being a part of the [employer] study.

## 2013-06-29 NOTE — Telephone Encounter (Signed)
Pt advised of Shane's note.  With ins & discount card Rx is $397 a month.  Which she can't afford.  Her husband is getting ready to have new ins in a couple weeks & she will try to fill with his ins & see the cost.  Per Audelia Acton ok to give samples.  (In refrig)  Pt informed Bydeuron must be kept in refrig.

## 2013-06-30 ENCOUNTER — Other Ambulatory Visit: Payer: Self-pay | Admitting: Medical

## 2013-06-30 ENCOUNTER — Telehealth: Payer: Self-pay | Admitting: Medical

## 2013-06-30 MED ORDER — ALBIGLUTIDE 30 MG ~~LOC~~ PEN: 30.0000 mg | PEN_INJECTOR | SUBCUTANEOUS | Status: DC

## 2013-06-30 NOTE — Telephone Encounter (Signed)
Since insurance wouldn't cover Bydureon, lets begin Tanzeum once weekly which is a very similar medication.  If we have demo models, have nurse demo this for them.  If not, call drug rep and see if she can get the diabetes education to come in and demonstrate as well as give diabetes education.    Sorry we have had so much problems getting these medication covered, but I do think this will work well to control her sugars.

## 2013-07-01 NOTE — Telephone Encounter (Signed)
Pt informed of medication change.  She will come by the office & pick up demonstration [employer] & discount card & sample

## 2013-07-27 ENCOUNTER — Telehealth: Payer: Self-pay | Admitting: Medical

## 2013-07-27 NOTE — Telephone Encounter (Signed)
Advised pt of Shane's recommedation & made appt for 3/23

## 2013-07-27 NOTE — Telephone Encounter (Signed)
Have her go ahead and have f/u appt since we had to get creative with the injection medication recommendations.  Have her bring glucose readings too

## 2013-08-09 ENCOUNTER — Ambulatory Visit: Payer: PRIVATE HEALTH INSURANCE | Admitting: Medical

## 2013-08-12 ENCOUNTER — Encounter: Payer: Self-pay | Admitting: Medical

## 2013-08-12 ENCOUNTER — Ambulatory Visit (INDEPENDENT_AMBULATORY_CARE_PROVIDER_SITE_OTHER): Payer: BC Managed Care – PPO | Admitting: Medical

## 2013-08-12 VITALS — BP 142/80 | HR 84 | Temp 98.2°F | Resp 16 | Wt 149.0 lb

## 2013-08-12 DIAGNOSIS — I10 Essential (primary) hypertension: Secondary | ICD-10-CM

## 2013-08-12 DIAGNOSIS — E118 Type 2 diabetes mellitus with unspecified complications: Principal | ICD-10-CM

## 2013-08-12 DIAGNOSIS — H409 Unspecified glaucoma: Secondary | ICD-10-CM

## 2013-08-12 DIAGNOSIS — IMO0002 Reserved for concepts with insufficient information to code with codable children: Secondary | ICD-10-CM

## 2013-08-12 DIAGNOSIS — E1165 Type 2 diabetes mellitus with hyperglycemia: Secondary | ICD-10-CM

## 2013-08-12 LAB — CBC
HCT: 34.5 % — ABNORMAL LOW (ref 36.0–46.0)
Hemoglobin: 12 g/dL (ref 12.0–15.0)
MCH: 29.4 pg (ref 26.0–34.0)
MCHC: 34.8 g/dL (ref 30.0–36.0)
MCV: 84.6 fL (ref 78.0–100.0)
Platelets: 362 10*3/uL (ref 150–400)
RBC: 4.08 MIL/uL (ref 3.87–5.11)
RDW: 13.2 % (ref 11.5–15.5)
WBC: 6.9 10*3/uL (ref 4.0–10.5)

## 2013-08-12 LAB — COMPREHENSIVE METABOLIC PANEL
ALT: 15 U/L (ref 0–35)
AST: 15 U/L (ref 0–37)
Albumin: 4.5 g/dL (ref 3.5–5.2)
Alkaline Phosphatase: 57 U/L (ref 39–117)
BUN: 12 mg/dL (ref 6–23)
CO2: 30 mEq/L (ref 19–32)
Calcium: 9.9 mg/dL (ref 8.4–10.5)
Chloride: 97 mEq/L (ref 96–112)
Creat: 0.82 mg/dL (ref 0.50–1.10)
Glucose, Bld: 167 mg/dL — ABNORMAL HIGH (ref 70–99)
Potassium: 3.7 mEq/L (ref 3.5–5.3)
Sodium: 135 mEq/L (ref 135–145)
Total Bilirubin: 0.7 mg/dL (ref 0.2–1.2)
Total Protein: 7.1 g/dL (ref 6.0–8.3)

## 2013-08-12 LAB — LIPID PANEL
Cholesterol: 208 mg/dL — ABNORMAL HIGH (ref 0–200)
HDL: 37 mg/dL — ABNORMAL LOW (ref >39)
LDL Cholesterol: 151 mg/dL — ABNORMAL HIGH (ref 0–99)
Total CHOL/HDL Ratio: 5.6 Ratio
Triglycerides: 99 mg/dL (ref <150)
VLDL: 20 mg/dL (ref 0–40)

## 2013-08-12 LAB — POCT GLYCOSYLATED HEMOGLOBIN (HGB A1C): Hemoglobin A1C: 8.4

## 2013-08-12 MED ORDER — ALOGLIPTIN-METFORMIN HCL 12.5-1000 MG PO TABS: 1.0000 | ORAL_TABLET | Freq: Two times a day (BID) | ORAL | Status: DC

## 2013-08-12 MED ORDER — LISINOPRIL-HYDROCHLOROTHIAZIDE 20-25 MG PO TABS: 1.0000 | ORAL_TABLET | Freq: Every day | ORAL | Status: DC

## 2013-08-12 NOTE — Patient Instructions (Signed)
  Thank you for giving me the opportunity to serve you today.    Your diagnosis today includes: Encounter Diagnoses  Name Primary?  [name] DIABETES MELLITUS, II, COMPLICATIONS Yes  . Essential hypertension, benign   . Glaucoma      Specific recommendations today include:  Continue Tanzeum once weekly  Stop plain or XR metformin.  Begin samples of Kazano 1 tablet once daily for a week, then twice daily  Continue blood pressure medication  Continue checking glucose  Follow up: in 3 months for recheck and physical, pending labs

## 2013-08-12 NOTE — Progress Notes (Signed)
   Subjective:   Tina Horton is a 48 y.o. female presenting on 08/12/2013 with Diabetes  Here for routine follow up on diabetes and blood pressure. At last visit we added GLP-1 tanzeum after having to deal with insurance about coverage.  She had a horrible nausea the first week of vomiting but then this subsided. After that she has been having much better sugar readings, checking glucose 3 times a day, takingTanzeum every week.  She is trying to do better on her diet although she has been adding some salt to food recently which she says is causing her blood pressure to be higher today.  She is giving herself the injections she feels quite comfortable with that now.   She is compliant with her blood pressure medicine. She switch back to regular metformin twice a day but ran out.  No other complaint.  Review of Systems ROS as in subjective      Objective:     Filed Vitals:   08/12/13 1141  BP: 142/80  Pulse: 84  Temp: 98.2 F (36.8 C)  Resp: 16    General appearance: alert, no distress, WD/WN Neck: supple, no lymphadenopathy, no thyromegaly, no masses Heart: RRR, normal S1, S2, no murmurs Lungs: CTA bilaterally, no wheezes, rhonchi, or rales Pulses: 2+ symmetric, upper and lower extremities, normal cap refill      Assessment: Encounter Diagnoses  Name Primary?  [name] DIABETES MELLITUS, II, COMPLICATIONS Yes  . Essential hypertension, benign   . Glaucoma      Plan: DM type II - improved HgbA1C today at 8.4%.  Add Kazano BID, c/t Tanzeum 30mg  weekly, work on diet changes as discussed, recheck 6 wk for physical and recheck. Hypertension-continue same medication, recheck at next visit with possibility of adding another meidcation.  Goal <130/80 Glaucoma - see eye doctor as usual  [name] was seen today for diabetes.  Diagnoses and associated orders for this visit:  DIABETES MELLITUS, II, COMPLICATIONS - HgB 123456 - Comprehensive metabolic panel - Lipid  panel - CBC  Essential hypertension, benign - Comprehensive metabolic panel - Lipid panel - CBC  Glaucoma - Comprehensive metabolic panel - Lipid panel - CBC  Other Orders - Alogliptin-Metformin HCl (KAZANO) 12.09-998 MG TABS; Take 1 tablet by mouth 2 (two) times daily. - lisinopril-hydrochlorothiazide (PRINZIDE,ZESTORETIC) 20-25 MG per tablet; Take 1 tablet by mouth daily.    Return pending labs.

## 2013-08-16 ENCOUNTER — Telehealth: Payer: Self-pay | Admitting: Internal Medicine

## 2013-08-16 NOTE — Telephone Encounter (Signed)
Pharmacy states that pt has a $60 co-pay after manufacturer coupon for kazano. She would like a less expensive alternative. Please authorize if appropriate

## 2013-08-16 NOTE — Telephone Encounter (Signed)
See me about insurance coverage for [employer].

## 2013-08-18 NOTE — Telephone Encounter (Signed)
Pt had not activated the discount card for [employer]

## 2013-08-31 NOTE — Telephone Encounter (Signed)
Recv'd another fax from pharmacy & called pt & she states she is still on samples & hasn't activated the card yet but will call if any problems after activation

## 2013-10-05 ENCOUNTER — Telehealth: Payer: Self-pay | Admitting: Medical

## 2013-10-05 ENCOUNTER — Other Ambulatory Visit: Payer: Self-pay | Admitting: Family Medicine

## 2013-10-05 MED ORDER — ALBIGLUTIDE 30 MG ~~LOC~~ PEN: 50.0000 mg | PEN_INJECTOR | SUBCUTANEOUS | Status: DC

## 2013-10-05 NOTE — Telephone Encounter (Signed)
Actually we can put her back on the metformin plain and increase Tanzeum to 50mg  weekly.  This may be cheaper and easier.  If agreeable, refill the prior plain metformin (not Kazono) she was taking and increase Tanzeum to 50mg  weekly.

## 2013-10-05 NOTE — Telephone Encounter (Signed)
I need to know what dose of Metformin to send in to the pharmacy. She was Taking 500 mg before. Please, advise. CLS  I called out the Tanzeum 50 mg into the pharmacy. CLS Patient is aware of the messgae and does agree to just taking plain Metformin and the increase of the Tanzeum. CLS

## 2013-10-06 ENCOUNTER — Other Ambulatory Visit: Payer: Self-pay | Admitting: Medical

## 2013-10-06 MED ORDER — METFORMIN HCL 1000 MG PO TABS: 1000.0000 mg | ORAL_TABLET | Freq: Two times a day (BID) | ORAL | Status: DC

## 2013-10-06 MED ORDER — ALBIGLUTIDE 50 MG ~~LOC~~ PEN: 50.0000 mg | PEN_INJECTOR | SUBCUTANEOUS | Status: DC

## 2013-10-06 NOTE — Telephone Encounter (Signed)
I sent both, done

## 2013-10-18 ENCOUNTER — Ambulatory Visit: Payer: BC Managed Care – PPO | Admitting: Medical

## 2014-03-28 ENCOUNTER — Telehealth: Payer: Self-pay | Admitting: Medical

## 2014-03-28 NOTE — Telephone Encounter (Signed)
Needs OV/diabetes checkup, call out 30 days of Lisinopril HCT

## 2014-03-29 ENCOUNTER — Other Ambulatory Visit: Payer: Self-pay | Admitting: Family Medicine

## 2014-03-29 MED ORDER — LISINOPRIL-HYDROCHLOROTHIAZIDE 20-25 MG PO TABS: 1.0000 | ORAL_TABLET | Freq: Every day | ORAL | Status: DC

## 2014-03-29 NOTE — Telephone Encounter (Signed)
Patient states that there was a mistake with there insurance and it was cancelled so she is waiting for them to re open there insurance and she will schedule a DM follow up.

## 2015-03-21 ENCOUNTER — Ambulatory Visit (INDEPENDENT_AMBULATORY_CARE_PROVIDER_SITE_OTHER): Payer: 59 | Admitting: Medical

## 2015-03-21 ENCOUNTER — Encounter: Payer: Self-pay | Admitting: Medical

## 2015-03-21 VITALS — BP 190/92 | HR 102 | Temp 98.0°F | Wt 148.0 lb

## 2015-03-21 DIAGNOSIS — Z91199 Patient's noncompliance with other medical treatment and regimen due to unspecified reason: Secondary | ICD-10-CM

## 2015-03-21 DIAGNOSIS — I10 Essential (primary) hypertension: Secondary | ICD-10-CM | POA: Insufficient documentation

## 2015-03-21 DIAGNOSIS — I889 Nonspecific lymphadenitis, unspecified: Secondary | ICD-10-CM | POA: Insufficient documentation

## 2015-03-21 DIAGNOSIS — Z9119 Patient's noncompliance with other medical treatment and regimen: Secondary | ICD-10-CM | POA: Diagnosis not present

## 2015-03-21 DIAGNOSIS — Z794 Long term (current) use of insulin: Secondary | ICD-10-CM

## 2015-03-21 DIAGNOSIS — R011 Cardiac murmur, unspecified: Secondary | ICD-10-CM

## 2015-03-21 DIAGNOSIS — IMO0002 Reserved for concepts with insufficient information to code with codable children: Secondary | ICD-10-CM | POA: Insufficient documentation

## 2015-03-21 DIAGNOSIS — E118 Type 2 diabetes mellitus with unspecified complications: Secondary | ICD-10-CM | POA: Diagnosis not present

## 2015-03-21 DIAGNOSIS — M542 Cervicalgia: Secondary | ICD-10-CM | POA: Diagnosis not present

## 2015-03-21 DIAGNOSIS — H409 Unspecified glaucoma: Secondary | ICD-10-CM

## 2015-03-21 DIAGNOSIS — G4489 Other headache syndrome: Secondary | ICD-10-CM | POA: Diagnosis not present

## 2015-03-21 DIAGNOSIS — E1165 Type 2 diabetes mellitus with hyperglycemia: Secondary | ICD-10-CM | POA: Insufficient documentation

## 2015-03-21 MED ORDER — LISINOPRIL-HYDROCHLOROTHIAZIDE 20-25 MG PO TABS: 1.0000 | ORAL_TABLET | Freq: Every day | ORAL | Status: DC

## 2015-03-21 MED ORDER — GLIPIZIDE 5 MG PO TABS: 5.0000 mg | ORAL_TABLET | Freq: Two times a day (BID) | ORAL | Status: DC

## 2015-03-21 NOTE — Progress Notes (Addendum)
Subjective: Chief Complaint  Patient presents with  . puffy ear    said she has been tossing and turning a lot in her sleep and fell a few weeks ago so not sure how it started. lt ear. had put a heating pad on it.   Here for c/o swelling of posterior left neck.  In the last few days had headache and neck ache, used heating pad, headache went away, but feels puffy area at back of left neck.  Has had some back and leg started hurting the other day, but that went away.  Has had some head congestion.   Had sinus infection 2 weeks ago that seemed to resolve.   Denies fever.     BP and pulse is elevated today which she attributes to eating some chips and popcorn with salt daily.   She notes that she had run out of medications from last visit due to insurance mix up but told nurse she was using "natural remedies" such as drinking some vinegar and refused to take her medications.   She notes problems with nausea, vomiting and diarrhea with Metformin and Tanzeum.   She declines labs today.  She notes her own research online showed metformin to be the worst drug ever and that she refuses to take this.   Of note, she hasn't called here since last visit in the spring to raise any of these concerns.    No other c/o.    Past Medical History  Diagnosis Date  . Hypertension   . Myopia   . Glaucoma     Dr. Einar Gip  . GERD (gastroesophageal reflux disease)   . H/O mammogram 2005  . Routine gynecological examination     last pap 2005  . Hyperlipidemia   . Diabetes mellitus without complication ([place])     age 49yo   ROS as in subjective   Objective: BP 190/92 mmHg  Pulse 102  Temp(Src) 98 F (36.7 C) (Oral)  Wt 148 lb (67.132 kg)  LMP 03/18/2015  General appearance: alert, no distress, WD/WN, AA female HEENT: normocephalic, sclerae anicteric, TMs pearly, nares patent, no discharge or erythema, pharynx normal Oral cavity: MMM, no lesions Neck: left post auricular 1cm tender raised lymph node,  but no other palpable nodes.  No other mass or swelling.  Rest of neck non tender, supple, no lymphadenopathy, no thyromegaly, no masses, normal ROM Heart: brief 2/6 systolic murmur heard best in upper sternal border.  otherwise normal S1, s2 Lungs: CTA bilaterally, no wheezes, rhonchi, or rales Ext: no edema Pulses: 2+ symmetric, upper and lower extremities, normal cap refill Neuro: nonfocal exam No meningeal signs  Assessment: Encounter Diagnoses  Name Primary?  . Lymphadenitis Yes  . Headache syndrome   . Cervicalgia   . Diabetes mellitus with complication in adult patient ([place])   . Essential hypertension   . Noncompliance   . GLAUCOMA   . Heart murmur     Plan: Lymphadenitis, headache, cervicalgia - discussed mild findings.   Can c/t Tylenol, heat pad, and if not imploring or worse within 1-2 wk, then recheck.  She declines CBC today  Discussed her noncompliance, fears about medication side effects, and the need to contact us in the future if not willing to take a medication due to either cost or fear vs just not taking the medication and not calling back.    DM type 2, HTN, noncompliance - discussed risks of uncontrolled diabetes and hypertension, discussed medications, risk of medications.  Discussed need for better compliance.   She del cines any lab work today.  Begin trial of Glipizide since she has had problems with metformin and tanzeum and refuses to use these or combo drugs with these or similar.   discussed need for better diet, routine exercise.  Gave script for glucometer and testing supplies, discussed importance of glucose testing at home, particularly if not willing to check labs here.  F/u 67mo.  HTN - restart BP medication, discussed risks of uncontrolled HTN.  discussed risks/benefits of medication  glaucoma - f/u with eye doctor  Murmur - noted on exam, asymptomatic, not new, but will need to address at next visit  F/u 90mo

## 2015-04-11 ENCOUNTER — Other Ambulatory Visit: Payer: Self-pay | Admitting: Medical

## 2015-04-20 ENCOUNTER — Ambulatory Visit: Payer: 59 | Admitting: Medical

## 2015-04-24 ENCOUNTER — Other Ambulatory Visit: Payer: Self-pay | Admitting: Medical

## 2015-05-16 ENCOUNTER — Other Ambulatory Visit: Payer: Self-pay | Admitting: Medical

## 2015-05-24 ENCOUNTER — Other Ambulatory Visit: Payer: Self-pay | Admitting: Medical

## 2015-05-24 NOTE — Telephone Encounter (Signed)
Is this ok to refill? pts last diabetic appt was 07/2013

## 2015-06-18 ENCOUNTER — Other Ambulatory Visit: Payer: Self-pay | Admitting: Medical

## 2015-06-22 ENCOUNTER — Other Ambulatory Visit: Payer: Self-pay | Admitting: Medical

## 2015-07-12 ENCOUNTER — Other Ambulatory Visit (HOSPITAL_COMMUNITY)
Admission: RE | Admit: 2015-07-12 | Discharge: 2015-07-12 | Disposition: A | Discharge status: Home / Self Care | Payer: BLUE CROSS/BLUE SHIELD | Source: Ambulatory Visit | Attending: Obstetrics and Gynecology | Admitting: Obstetrics and Gynecology

## 2015-07-12 ENCOUNTER — Other Ambulatory Visit: Payer: Self-pay | Admitting: Obstetrics and Gynecology

## 2015-07-12 DIAGNOSIS — Z01419 Encounter for gynecological examination (general) (routine) without abnormal findings: Secondary | ICD-10-CM | POA: Insufficient documentation

## 2015-07-12 DIAGNOSIS — Z1151 Encounter for screening for human papillomavirus (HPV): Secondary | ICD-10-CM | POA: Insufficient documentation

## 2015-07-14 ENCOUNTER — Telehealth: Payer: Self-pay | Admitting: Medical

## 2015-07-14 LAB — CYTOLOGY - PAP

## 2015-07-14 NOTE — Telephone Encounter (Signed)
Received records request to send records to [facility] ob/g[facility]. Records faxed to [phone].

## 2015-07-17 ENCOUNTER — Other Ambulatory Visit: Payer: Self-pay | Admitting: Medical

## 2015-07-19 ENCOUNTER — Other Ambulatory Visit: Payer: Self-pay | Admitting: Medical

## 2015-08-08 ENCOUNTER — Telehealth: Payer: Self-pay | Admitting: Medical

## 2015-08-08 NOTE — Telephone Encounter (Signed)
I received note for surgery clearance request.   Set her up for a physical/surgery clearance ASAP

## 2015-08-08 NOTE — Telephone Encounter (Signed)
Pt coming in April 3rd for cpe and surgery clearance.

## 2015-08-12 ENCOUNTER — Other Ambulatory Visit: Payer: Self-pay | Admitting: Medical

## 2015-08-21 ENCOUNTER — Ambulatory Visit (INDEPENDENT_AMBULATORY_CARE_PROVIDER_SITE_OTHER): Payer: BLUE CROSS/BLUE SHIELD | Admitting: Medical

## 2015-08-21 ENCOUNTER — Encounter: Payer: Self-pay | Admitting: Medical

## 2015-08-21 VITALS — BP 160/88 | HR 94 | Ht 62.0 in | Wt 154.0 lb

## 2015-08-21 DIAGNOSIS — Z0000 Encounter for general adult medical examination without abnormal findings: Secondary | ICD-10-CM

## 2015-08-21 DIAGNOSIS — E785 Hyperlipidemia, unspecified: Secondary | ICD-10-CM | POA: Insufficient documentation

## 2015-08-21 DIAGNOSIS — E118 Type 2 diabetes mellitus with unspecified complications: Secondary | ICD-10-CM

## 2015-08-21 DIAGNOSIS — E041 Nontoxic single thyroid nodule: Secondary | ICD-10-CM | POA: Diagnosis not present

## 2015-08-21 DIAGNOSIS — H409 Unspecified glaucoma: Secondary | ICD-10-CM | POA: Diagnosis not present

## 2015-08-21 DIAGNOSIS — Z2820 Immunization not carried out because of patient decision for unspecified reason: Secondary | ICD-10-CM

## 2015-08-21 DIAGNOSIS — I10 Essential (primary) hypertension: Secondary | ICD-10-CM | POA: Diagnosis not present

## 2015-08-21 DIAGNOSIS — R011 Cardiac murmur, unspecified: Secondary | ICD-10-CM | POA: Insufficient documentation

## 2015-08-21 LAB — LIPID PANEL
Cholesterol: 204 mg/dL — ABNORMAL HIGH (ref 125–200)
HDL: 38 mg/dL — ABNORMAL LOW (ref >=46)
LDL Cholesterol: 144 mg/dL — ABNORMAL HIGH (ref <130)
Total CHOL/HDL Ratio: 5.4 Ratio — ABNORMAL HIGH (ref <=5.0)
Triglycerides: 109 mg/dL (ref <150)
VLDL: 22 mg/dL (ref <30)

## 2015-08-21 LAB — POCT URINALYSIS DIPSTICK
Bilirubin, UA: NEGATIVE
Blood, UA: NEGATIVE
Glucose, UA: NEGATIVE
Ketones, UA: NEGATIVE
Leukocytes, UA: NEGATIVE
Nitrite, UA: NEGATIVE
Protein, UA: NEGATIVE
Spec Grav, UA: 1.01
Urobilinogen, UA: NEGATIVE
pH, UA: 6

## 2015-08-21 LAB — CBC WITH DIFFERENTIAL/PLATELET
Basophils Absolute: 0 cells/uL (ref 0–200)
Basophils Relative: 0 %
Eosinophils Absolute: 148 cells/uL (ref 15–500)
Eosinophils Relative: 2 %
HCT: 32.4 % — ABNORMAL LOW (ref 35.0–45.0)
Hemoglobin: 10.4 g/dL — ABNORMAL LOW (ref 11.7–15.5)
Lymphocytes Relative: 29 %
Lymphs Abs: 2146 cells/uL (ref 850–3900)
MCH: 26 pg — ABNORMAL LOW (ref 27.0–33.0)
MCHC: 32.1 g/dL (ref 32.0–36.0)
MCV: 81 fL (ref 80.0–100.0)
MPV: 10.4 fL (ref 7.5–12.5)
Monocytes Absolute: 444 cells/uL (ref 200–950)
Monocytes Relative: 6 %
Neutro Abs: 4662 cells/uL (ref 1500–7800)
Neutrophils Relative %: 63 %
Platelets: 360 10*3/uL (ref 140–400)
RBC: 4 MIL/uL (ref 3.80–5.10)
RDW: 17.7 % — ABNORMAL HIGH (ref 11.0–15.0)
WBC: 7.4 10*3/uL (ref 4.0–10.5)

## 2015-08-21 LAB — HEMOGLOBIN A1C
Hgb A1c MFr Bld: 8.4 % — ABNORMAL HIGH (ref <5.7)
Mean Plasma Glucose: 194 mg/dL

## 2015-08-21 LAB — COMPREHENSIVE METABOLIC PANEL
ALT: 18 U/L (ref 6–29)
AST: 16 U/L (ref 10–35)
Albumin: 4.3 g/dL (ref 3.6–5.1)
Alkaline Phosphatase: 51 U/L (ref 33–130)
BUN: 13 mg/dL (ref 7–25)
CO2: 30 mmol/L (ref 20–31)
Calcium: 9.8 mg/dL (ref 8.6–10.4)
Chloride: 94 mmol/L — ABNORMAL LOW (ref 98–110)
Creat: 0.79 mg/dL (ref 0.50–1.05)
Glucose, Bld: 180 mg/dL — ABNORMAL HIGH (ref 65–99)
Potassium: 3.2 mmol/L — ABNORMAL LOW (ref 3.5–5.3)
Sodium: 134 mmol/L — ABNORMAL LOW (ref 135–146)
Total Bilirubin: 0.4 mg/dL (ref 0.2–1.2)
Total Protein: 6.9 g/dL (ref 6.1–8.1)

## 2015-08-21 LAB — T4, FREE: Free T4: 1.3 ng/dL (ref 0.8–1.8)

## 2015-08-21 LAB — TSH: TSH: 1.65 mIU/L

## 2015-08-21 NOTE — Progress Notes (Addendum)
Subjective:   HPI  Tina Horton is a 50 y.o. female who presents for a surgery clearance.    Mrs. Tina Horton has hx/o diabetes type 2, hypertension, hyperlipidemia, glaucoma, and poor compliance.  She is planning to have vaginal hysterectomy and bilat salpingectomy in late April due to fibroids, prolapse uterus, anemia, and pelvis pains.     diabetes - not checking glucose.  She does check feet regular, no c/o.  Eating mostly healthy.  Bakes a lot of food, avoids lots of sugar, salt.   Does slip every now and then.   Was exercising until a few months ago when the pelvic pain got worse.   Checks blood pressure at [employer] occasionally, usually normal number, but she notes eating [employer] meal that was unhealthy last night.  She attributes her high BP this morning to the meal last night.     Currently taking Lisinopril HCT 20/25mg  daily, Aspirin 81mg  daily, Glipizide 5mg  BID, iron supplement, and some other supplements OTC/multivitamin.  Reviewed their medical, surgical, family, social, medication, and allergy history and updated chart as appropriate.  Past Medical History  Diagnosis Date  . Hypertension   . Myopia   . Glaucoma     Dr. Einar Gip  . GERD (gastroesophageal reflux disease)   . H/O mammogram 2005  . Routine gynecological examination     last pap 2005  . Hyperlipidemia   . Diabetes mellitus without complication [facility])     age 51yo    Past Surgical History  Procedure Laterality Date  . Lasik      x2  . Incision and drainage      abdominal superficial abscess    Social History   Social History  . Marital Status: Married    Spouse Name: N/A  . Number of Children: N/A  . Years of Education: N/A   Occupational History  . Not on file.   Social History Main Topics  . Smoking status: Never Smoker   . Smokeless tobacco: Not on file  . Alcohol Use: No  . Drug Use: No  . Sexual Activity: Not on file   Other Topics Concern  . Not on file   Social  History Narrative   Married, has 3 children, not exercising, but walks at work, works at [employer] History  Problem Relation Age of Onset  . Diabetes Mother   . Hypertension Mother   . Hypertension Father   . Hypertension Sister   . Hypertension Brother   . Stroke Maternal Grandmother   . Cancer Neg Hx   . Heart disease Neg Hx      Current outpatient prescriptions:  .  Chlorophyll (CHLOROXYGEN) 50 MG CAPS, Take 2 capsules by mouth daily., Disp: , Rfl:  .  Ferrous Bisglycinate Chelate 15 MG TABS, Take 25 mg by mouth daily., Disp: , Rfl:  .  lisinopril-hydrochlorothiazide (PRINZIDE,ZESTORETIC) 20-25 MG tablet, 1 tablet po daily, Disp: 90 tablet, Rfl: 1 .  Multiple Vitamin (MULTIVITAMIN) tablet, Take 1 tablet by mouth daily., Disp: , Rfl:  .  amLODipine (NORVASC) 10 MG tablet, Take 1 tablet (10 mg total) by mouth daily., Disp: 90 tablet, Rfl: 3 .  aspirin EC 81 MG tablet, Take 1 tablet (81 mg total) by mouth daily., Disp: 90 tablet, Rfl: 3 .  Insulin Glargine (LANTUS) 100 UNIT/ML Solostar Pen, Inject 10 Units into the skin daily at 10 pm., Disp: 15 mL, Rfl: 5 .  omeprazole (PRILOSEC) 20 MG capsule, Take  1 capsule (20 mg total) by mouth 2 (two) times daily before a meal. (Patient not taking: Reported on 03/21/2015), Disp: 60 capsule, Rfl: 0 .  potassium chloride (K-DUR,KLOR-CON) 10 MEQ tablet, Take 1 tablet (10 mEq total) by mouth daily., Disp: 90 tablet, Rfl: 3 .  pravastatin (PRAVACHOL) 20 MG tablet, Take 1 tablet (20 mg total) by mouth daily., Disp: 90 tablet, Rfl: 1  Allergies  Allergen Reactions  . Metformin And Related     Vomiting, diarrhea, nausea  . Tanzeum [Albiglutide]     nausea    Review of Systems Constitutional: -fever, -chills, -sweats, -unexpected weight change, -decreased appetite, -fatigue Allergy: -sneezing, -itching, -congestion Dermatology: -changing moles, --rash, -lumps ENT: -runny nose, -ear pain, -sore throat, -hoarseness, -sinus pain, -teeth pain,  - ringing in ears, -hearing loss, -nosebleeds Cardiology: -chest pain, -palpitations, -swelling, -difficulty breathing when lying flat, -waking up short of breath Respiratory: -cough, -shortness of breath, -difficulty breathing with exercise or exertion, -wheezing, -coughing up blood Gastroenterology: -abdominal pain, -nausea, -vomiting, -diarrhea, -constipation, -blood in stool, -changes in bowel movement, -difficulty swallowing or eating Hematology: -bleeding, -bruising  Musculoskeletal: -joint aches, -muscle aches, -joint swelling, -back pain, -neck pain, -cramping, -changes in gait Ophthalmology: denies vision changes, eye redness, itching, discharge Urology: -burning with urination, -difficulty urinating, -blood in urine, -urinary frequency, -urgency, -incontinence Neurology: -headache, -weakness, -tingling, -numbness, -memory loss, -falls, -dizziness Psychology: -depressed mood, -agitation, -sleep problems     Objective:   Physical Exam  BP 160/88 mmHg  Pulse 94  Ht 5\' 2"  (1.575 m)  Wt 154 lb (69.854 kg)  BMI 28.16 kg/m2  LMP 08/15/2015  General appearance: alert, no distress, WD/WN, AA female Skin: no worrisome findings. HEENT: normocephalic, conjunctiva/corneas normal, sclerae anicteric, PERRLA, EOMi, nares patent, no discharge or erythema, pharynx normal Oral cavity: MMM, tongue normal, teeth in good repair Neck: supple, no lymphadenopathy, left thyroid nodule palpated, possible 1-2 cm diameter, otherwise no thyromegaly, no other masses, normal ROM, no bruits Chest: non tender, normal shape and expansion Heart: RRR, normal S1, S2, no murmurs Lungs: CTA bilaterally, no wheezes, rhonchi, or rales Abdomen: +bs, soft, mild lower abdominal tenderness, otherwise non tender, non distended, no masses, no hepatomegaly, no splenomegaly, no bruits Back: non tender, normal ROM, no scoliosis Musculoskeletal: upper extremities non tender, no obvious deformity, normal ROM throughout, lower  extremities non tender, no obvious deformity, normal ROM throughout Extremities: no edema, no cyanosis, no clubbing Pulses: 2+ symmetric, upper and lower extremities, normal cap refill Neurological: alert, oriented x 3, CN2-12 intact, strength normal upper extremities and lower extremities, sensation normal throughout, DTRs 2+ throughout, no cerebellar signs, gait normal Psychiatric: normal affect, behavior normal, pleasant  Breast/gyn/rectal - deferred to gyn   Adult ECG Report  Indication: preop exam  Rate: 93 bpm  Rhythm: normal sinus rhythm  QRS Axis: 12 degrees  PR Interval: 157ms  QRS Duration: 80ms  QTc: 432ms  Conduction Disturbances: none  Other Abnormalities: none  Patient's cardiac risk factors are: diabetes mellitus, dyslipidemia and hypertension.  EKG comparison: 2014  Narrative Interpretation: nsr, no new acute changes, no worrisome findings     Assessment and Plan :    Encounter Diagnoses  Name Primary?  . Encounter for health maintenance examination in adult Yes  . Diabetes mellitus with complication in adult patient ([place])   . Essential hypertension   . Hyperlipidemia   . Glaucoma   . Thyroid nodule   . Heart murmur   . Vaccine refused by patient    physical  exam - discussed healthy lifestyle, diet, exercise, preventative care, vaccinations, and addressed their concerns.  Routine labs today.  See your eye doctor yearly for routine vision care. See your dentist yearly for routine dental care including hygiene visits twice yearly. See your gynecologist yearly for routine gynecological care.  Noncompliance - again reiterated importance of compliance with diet, exercise, medication, and f/u.  Diabetes type 2 - again reiterated need for compliance.  discussed that even if financial hardship, keep [place] informed so we can work with her to avoid her running out of medication, using samples if possible.  Discussed how lack of compliance and intermittently being off  medication can lead to higher chance of complications.   C/t current medication, labs today  HTN - same as diabetes regarding compliance.  Not at goal.  Pending labs, will modify regimen.  Avoid added salt in diet, needs to exercise regularly, eat healthy low fat diet.  Hyperlipidemia - labs today  glaucoma - c/t routine f/u with eye doctor  Thyroid nodule - will need ultrasound, but we can possibly post pone til after gynecological surgery.  Labs today  Heart murmur - consider echo.  reviewed EKG  She declines pneumococcal, Hep B and flu vaccines  Follow-up pending labs   Navjot was seen today for annual exam.  Diagnoses and all orders for this visit:  Encounter for health maintenance examination in adult -     Comprehensive metabolic panel -     Lipid panel -     TSH -     CBC with Differential/Platelet -     Hemoglobin A1c -     Microalbumin / creatinine urine ratio -     HM DIABETES EYE EXAM -     HM DIABETES FOOT EXAM -     T4, free -     EKG 12-Lead -     POCT urinalysis dipstick  Diabetes mellitus with complication in adult patient ([place]) -     Lipid panel -     Hemoglobin A1c -     Microalbumin / creatinine urine ratio -     HM DIABETES EYE EXAM -     HM DIABETES FOOT EXAM -     EKG 12-Lead -     Ambulatory referral to Cardiology  Essential hypertension -     EKG 12-Lead -     Ambulatory referral to Cardiology  Hyperlipidemia -     Ambulatory referral to Cardiology  Glaucoma  Thyroid nodule -     TSH -     T4, free  Heart murmur -     Ambulatory referral to Cardiology  Vaccine refused by patient  Other orders -     amLODipine (NORVASC) 10 MG tablet; Take 1 tablet (10 mg total) by mouth daily. -     pravastatin (PRAVACHOL) 20 MG tablet; Take 1 tablet (20 mg total) by mouth daily. -     lisinopril-hydrochlorothiazide (PRINZIDE,ZESTORETIC) 20-25 MG tablet; 1 tablet po daily -     Insulin Glargine (LANTUS) 100 UNIT/ML Solostar Pen; Inject 10 Units  into the skin daily at 10 pm. -     aspirin EC 81 MG tablet; Take 1 tablet (81 mg total) by mouth daily. -     potassium chloride (K-DUR,KLOR-CON) 10 MEQ tablet; Take 1 tablet (10 mEq total) by mouth daily.

## 2015-08-21 NOTE — Addendum Note (Signed)
Addended by: Billie Lade on: 08/21/2015 01:31 PM   Modules accepted: Orders, SmartSet

## 2015-08-22 ENCOUNTER — Telehealth: Payer: Self-pay | Admitting: Medical

## 2015-08-22 LAB — MICROALBUMIN / CREATININE URINE RATIO
Creatinine, Urine: 15 mg/dL — ABNORMAL LOW (ref 20–320)
Microalb Creat Ratio: 133 mcg/mg creat — ABNORMAL HIGH (ref <30)
Microalb, Ur: 2 mg/dL

## 2015-08-22 MED ORDER — POTASSIUM CHLORIDE CRYS ER 10 MEQ PO TBCR: 10.0000 meq | EXTENDED_RELEASE_TABLET | Freq: Every day | ORAL | Status: DC

## 2015-08-22 MED ORDER — AMLODIPINE BESYLATE 10 MG PO TABS: 10.0000 mg | ORAL_TABLET | Freq: Every day | ORAL | Status: DC

## 2015-08-22 MED ORDER — LISINOPRIL-HYDROCHLOROTHIAZIDE 20-25 MG PO TABS: ORAL_TABLET | ORAL | Status: DC

## 2015-08-22 MED ORDER — INSULIN GLARGINE 100 UNIT/ML SOLOSTAR PEN: 10.0000 [IU] | PEN_INJECTOR | Freq: Every day | SUBCUTANEOUS | Status: DC

## 2015-08-22 MED ORDER — ASPIRIN EC 81 MG PO TBEC: 81.0000 mg | DELAYED_RELEASE_TABLET | Freq: Every day | ORAL | Status: DC

## 2015-08-22 MED ORDER — PRAVASTATIN SODIUM 20 MG PO TABS
20.0000 mg | ORAL_TABLET | Freq: Every day | ORAL | Status: DC
Start: 2015-08-22 — End: 2015-12-05

## 2015-08-22 NOTE — Addendum Note (Signed)
Addended by: Carlena Hurl on: 08/22/2015 08:29 AM   Modules accepted: Orders, Medications

## 2015-08-22 NOTE — Telephone Encounter (Signed)
Called this in

## 2015-08-22 NOTE — Telephone Encounter (Signed)
Pt called wanting some clarification about cardiology referral. She states she had info about an appt to see Dr Wynonia Lawman and then Dr. Kennon Holter office just called her with an appt for this Thur (4/6) which is a sooner appt that Dr. Thurman Coyer appt. Pt wanted to clarify that she does not need to see both doctors. Does she need to see one doctor over the other? She is assuming that she is to just take the sooner appt.

## 2015-08-22 NOTE — Telephone Encounter (Signed)
[facility] called  Pt was given  lantus solostar rx but was not given Rx for needles  PT was told to start tonight, needs rx needles

## 2015-08-23 ENCOUNTER — Telehealth: Payer: Self-pay | Admitting: Medical

## 2015-08-23 MED ORDER — INSULIN PEN NEEDLE 32G X 6 MM MISC: Status: DC

## 2015-08-23 NOTE — Telephone Encounter (Signed)
Pt called to let Audelia Acton know that she actually takes low dose 81mg  aspirin and not a baby aspirin as she originally told him at her appt

## 2015-08-23 NOTE — Telephone Encounter (Signed)
In chart

## 2015-08-23 NOTE — Telephone Encounter (Signed)
81mg  is the baby or low dose.

## 2015-08-23 NOTE — Telephone Encounter (Signed)
Canceled Tilly appt and pt is keeping the one with dr berry

## 2015-08-23 NOTE — Telephone Encounter (Signed)
Sent in med to pharmacy 

## 2015-08-24 ENCOUNTER — Other Ambulatory Visit: Payer: Self-pay | Admitting: Medical

## 2015-08-24 ENCOUNTER — Encounter: Payer: Self-pay | Admitting: Cardiovascular Disease

## 2015-08-24 ENCOUNTER — Telehealth: Payer: Self-pay | Admitting: Medical

## 2015-08-24 ENCOUNTER — Ambulatory Visit (INDEPENDENT_AMBULATORY_CARE_PROVIDER_SITE_OTHER): Payer: BLUE CROSS/BLUE SHIELD | Admitting: Cardiovascular Disease

## 2015-08-24 VITALS — BP 150/96 | HR 110 | Ht 61.0 in | Wt 152.8 lb

## 2015-08-24 DIAGNOSIS — R011 Cardiac murmur, unspecified: Secondary | ICD-10-CM | POA: Diagnosis not present

## 2015-08-24 DIAGNOSIS — Z01818 Encounter for other preprocedural examination: Secondary | ICD-10-CM

## 2015-08-24 DIAGNOSIS — I10 Essential (primary) hypertension: Secondary | ICD-10-CM | POA: Diagnosis not present

## 2015-08-24 MED ORDER — LISINOPRIL-HYDROCHLOROTHIAZIDE 20-25 MG PO TABS: ORAL_TABLET | ORAL | Status: DC

## 2015-08-24 NOTE — Assessment & Plan Note (Signed)
Patient has a soft systolic ejection murmur. I am going to check a 2-D echocardiogram to further evaluate this

## 2015-08-24 NOTE — Assessment & Plan Note (Signed)
History of hypertension with blood pressure measurements at 150/96. She is on amlodipine. She probably would benefit from being on an ACE inhibitor as well given her diabetes but I will leave this to her primary care physician to decide.

## 2015-08-24 NOTE — Telephone Encounter (Signed)
I reviewed cardiology note today.   For whatever reason the chart wasn't showing Lisinopril HCT active.   Make sure she is in fact taking Lisinopril HCT.

## 2015-08-24 NOTE — Patient Instructions (Signed)
Medication Instructions:  Your physician recommends that you continue on your current medications as directed. Please refer to the Current Medication list given to you today.   Labwork: none  Testing/Procedures: Your physician has requested that you have an echocardiogram. Echocardiography is a painless test that uses sound waves to create images of your heart. It provides your doctor with information about the size and shape of your heart and how well your heart's chambers and valves are working. This procedure takes approximately one hour. There are no restrictions for this procedure.  Your physician has requested that you have an exercise tolerance test. For further information please visit [email or web address]. Please also follow instruction sheet, as given.   Follow-Up: Follow up with Dr. Gwenlyn Found as needed.   Any Other Special Instructions Will Be Listed Below (If Applicable).     If you need a refill on your cardiac medications before your next appointment, please call your pharmacy.

## 2015-08-24 NOTE — Assessment & Plan Note (Signed)
History of hyperlipidemia on statin therapy followed by her PCP. 

## 2015-08-24 NOTE — Progress Notes (Signed)
08/24/2015 Tina Horton   1966/03/31  [id number]  Primary Physician Crisoforo Oxford, PA-C Primary Cardiologist: Lorretta Harp MD Renae Gloss   HPI:  [name] is a 50 year old moderately overweight married Tina Horton female mother of 3 visit copy 5 her daughter, Tina Horton today. She was referred for preoperative clearance before elective hysterectomy. She currently is out of work for the last 2 years but did work as a Chemical engineer at [employer]. Her cardiac risk factor profile is notable for treated hypertension, diabetes and hyperlipidemia. She has never had a heart attack or stroke. She denies chest pain or shortness of breath. She does have a murmur apparently.   Current Outpatient Prescriptions  Medication Sig Dispense Refill  . amLODipine (NORVASC) 10 MG tablet Take 1 tablet (10 mg total) by mouth daily. 90 tablet 3  . aspirin EC 81 MG tablet Take 1 tablet (81 mg total) by mouth daily. 90 tablet 3  . Chlorophyll (CHLOROXYGEN) 50 MG CAPS Take 1 capsule by mouth 2 (two) times daily.     . Fe Fum-FePoly-Vit C-Vit B3 (INTEGRA) 62.5-62.5-40-3 MG CAPS Take 1 capsule by mouth daily.    [name] ibuprofen (ADVIL,MOTRIN) 600 MG tablet Take 600 mg by mouth every 6 (six) hours as needed for cramping.    . Insulin Glargine (LANTUS) 100 UNIT/ML Solostar Pen Inject 10 Units into the skin daily at 10 pm. 15 mL 5  . Insulin Pen Needle 32G X 6 MM MISC Use pen needles for lantus solorstar pen 100 each 2  . latanoprost (XALATAN) 0.005 % ophthalmic solution Place 1 drop into both eyes at bedtime.    . Multiple Vitamin (MULTIVITAMIN) tablet Take 1 tablet by mouth daily.    . potassium chloride (K-DUR,KLOR-CON) 10 MEQ tablet Take 1 tablet (10 mEq total) by mouth daily. 90 tablet 3  . pravastatin (PRAVACHOL) 20 MG tablet Take 1 tablet (20 mg total) by mouth daily. 90 tablet 1   No current facility-administered medications for this visit.    Allergies  Allergen Reactions  .  Metformin And Related     Vomiting, diarrhea, nausea  . Tanzeum [Albiglutide] Nausea And Vomiting    nausea    Social History   Social History  . Marital Status: Married    Spouse Name: N/A  . Number of Children: N/A  . Years of Education: N/A   Occupational History  . Not on file.   Social History Main Topics  . Smoking status: Never Smoker   . Smokeless tobacco: Never Used  . Alcohol Use: No  . Drug Use: No  . Sexual Activity: Not on file   Other Topics Concern  . Not on file   Social History Narrative   Married, has 3 children, not exercising, but walks at work, works at [employer]: General: negative for chills, fever, night sweats or weight changes.  Cardiovascular: negative for chest pain, dyspnea on exertion, edema, orthopnea, palpitations, paroxysmal nocturnal dyspnea or shortness of breath Dermatological: negative for rash Respiratory: negative for cough or wheezing Urologic: negative for hematuria Abdominal: negative for nausea, vomiting, diarrhea, bright red blood per rectum, melena, or hematemesis Neurologic: negative for visual changes, syncope, or dizziness All other systems reviewed and are otherwise negative except as noted above.    Blood pressure 150/96, pulse 110, height 5\' 1"  (1.549 m), weight 152 lb 12.8 oz (69.31 kg), last menstrual period 08/15/2015.  General appearance: alert and no distress Neck:  no adenopathy, no carotid bruit, no JVD, supple, symmetrical, trachea midline and thyroid not enlarged, symmetric, no tenderness/mass/nodules Lungs: clear to auscultation bilaterally Heart: ssoft outflow tract murmur probably related to aortic sclerosis Extremities: extremities normal, atraumatic, no cyanosis or edema  EKG not performed today  ASSESSMENT AND PLAN:   HYPERCHOLESTEROLEMIA History of hyperlipidemia on statin therapy followed by her PCP  HYPERTENSION, BENIGN SYSTEMIC History of hypertension with blood pressure  measurements at 150/96. She is on amlodipine. She probably would benefit from being on an ACE inhibitor as well given her diabetes but I will leave this to her primary care physician to decide.  SYSTOLIC MURMUR Patient has a soft systolic ejection murmur. I am going to check a 2-D echocardiogram to further evaluate this      Lorretta Harp MD [facility], [facility] 08/24/2015 4:57 PM

## 2015-08-25 ENCOUNTER — Telehealth: Payer: Self-pay | Admitting: Medical

## 2015-08-25 NOTE — Telephone Encounter (Signed)
Pt concerned that blood sugar reading not to long ago was 279 then checked again and it was 309. She ate 1/2 sandwich for lunch on healthy bread. She just started Lantus 2 days ago. Yesterday for dinner had sandwich and few grapes.

## 2015-08-25 NOTE — Telephone Encounter (Signed)
If sugars stay above 130 by Monday, then go to 15 units Lantus nightly.  She can go up 3 units daily, per 7 days if glucose doesn't stay under 130.    Call back again in 2wk regarding units/glucose readings.

## 2015-08-25 NOTE — Telephone Encounter (Signed)
Already went over AV summary with her at previous call.

## 2015-08-25 NOTE — Telephone Encounter (Signed)
Pt stated that she is now taking amlodipine only. Discussed with pt she needs to take both and she said she would tomorrow

## 2015-08-25 NOTE — Telephone Encounter (Signed)
Since there was miscommunication apparently, please go back over my plan notes or AV summary with her.  Make sure she is taking EVERYTHING we advised!!!  She has been bad about compliance.      Of note, we are awaiting echocardiogram for surgery clearance.  Keep an eye out for this next week.

## 2015-08-25 NOTE — Telephone Encounter (Signed)
Pt is coming in Monday her meter is showing sugars at 406 then at 340 within minutes. She will have [place] do a finger stink here with our meter and her own then compare the two

## 2015-08-28 ENCOUNTER — Other Ambulatory Visit: Payer: BLUE CROSS/BLUE SHIELD

## 2015-08-28 NOTE — Patient Instructions (Signed)
Your procedure is scheduled on:  Wednesday, September 13, 2015  Enter through the [employer] of [facility] at: 11:30 AM  Pick up the phone at the desk and dial [phone].  Call this number if you have problems the morning of surgery: [phone].  Remember: Do NOT eat food:  After Midnight Tuesday, September 12, 2015  Do NOT drink clear liquids after:  9:00 AM day of surgery  Take these medicines the morning of surgery with a SIP OF WATER:  Amlodipine, Lisinopril, Potassium, Pravastatin  Take 1/2 (half) bedtime insulin dose the night before surgery.  Do NOT wear jewelry (body piercing), metal hair clips/bobby pins, make-up, or nail polish. Do NOT wear lotions, powders, or perfumes.  You may wear deodorant. Do NOT shave for 48 hours prior to surgery. Do NOT bring valuables to the hospital. Contacts, dentures, or bridgework may not be worn into surgery.  Leave suitcase in car.  After surgery it may be brought to your room.  For patients admitted to the hospital, checkout time is 11:00 AM the day of discharge.

## 2015-08-29 ENCOUNTER — Encounter (HOSPITAL_COMMUNITY): Payer: Self-pay

## 2015-08-29 ENCOUNTER — Telehealth: Payer: Self-pay

## 2015-08-29 ENCOUNTER — Encounter (HOSPITAL_COMMUNITY)
Admission: RE | Admit: 2015-08-29 | Discharge: 2015-08-29 | Disposition: A | Discharge status: Home / Self Care | Payer: BLUE CROSS/BLUE SHIELD | Source: Ambulatory Visit | Attending: Obstetrics and Gynecology | Admitting: Obstetrics and Gynecology

## 2015-08-29 DIAGNOSIS — E119 Type 2 diabetes mellitus without complications: Secondary | ICD-10-CM | POA: Insufficient documentation

## 2015-08-29 DIAGNOSIS — N920 Excessive and frequent menstruation with regular cycle: Secondary | ICD-10-CM | POA: Insufficient documentation

## 2015-08-29 DIAGNOSIS — Z794 Long term (current) use of insulin: Secondary | ICD-10-CM | POA: Insufficient documentation

## 2015-08-29 DIAGNOSIS — Z79899 Other long term (current) drug therapy: Secondary | ICD-10-CM | POA: Diagnosis not present

## 2015-08-29 DIAGNOSIS — N761 Subacute and chronic vaginitis: Secondary | ICD-10-CM | POA: Diagnosis not present

## 2015-08-29 DIAGNOSIS — N813 Complete uterovaginal prolapse: Secondary | ICD-10-CM | POA: Insufficient documentation

## 2015-08-29 DIAGNOSIS — I10 Essential (primary) hypertension: Secondary | ICD-10-CM | POA: Insufficient documentation

## 2015-08-29 DIAGNOSIS — Z01812 Encounter for preprocedural laboratory examination: Secondary | ICD-10-CM | POA: Insufficient documentation

## 2015-08-29 HISTORY — DX: Other seasonal allergic rhinitis: J30.2

## 2015-08-29 HISTORY — DX: Anemia, unspecified: D64.9

## 2015-08-29 HISTORY — DX: Palpitations: R00.2

## 2015-08-29 HISTORY — DX: Headache, unspecified: R51.9

## 2015-08-29 HISTORY — DX: Bronchitis, not specified as acute or chronic: J40

## 2015-08-29 HISTORY — DX: Chronic sinusitis, unspecified: J32.9

## 2015-08-29 HISTORY — DX: Nontoxic single thyroid nodule: E04.1

## 2015-08-29 HISTORY — DX: Cardiac murmur, unspecified: R01.1

## 2015-08-29 HISTORY — DX: Headache: R51

## 2015-08-29 HISTORY — DX: Pneumonia, unspecified organism: J18.9

## 2015-08-29 HISTORY — DX: Reserved for inherently not codable concepts without codable children: IMO0001

## 2015-08-29 HISTORY — DX: Legal blindness, as defined in USA: H54.8

## 2015-08-29 HISTORY — DX: Unspecified osteoarthritis, unspecified site: M19.90

## 2015-08-29 LAB — BASIC METABOLIC PANEL
Anion gap: 9 (ref 5–15)
BUN: 26 mg/dL — ABNORMAL HIGH (ref 6–20)
CO2: 30 mmol/L (ref 22–32)
Calcium: 10 mg/dL (ref 8.9–10.3)
Chloride: 94 mmol/L — ABNORMAL LOW (ref 101–111)
Creatinine, Ser: 1.06 mg/dL — ABNORMAL HIGH (ref 0.44–1.00)
GFR calc Af Amer: >60 mL/min (ref >60)
GFR calc non Af Amer: >60 mL/min (ref >60)
Glucose, Bld: 241 mg/dL — ABNORMAL HIGH (ref 65–99)
Potassium: 2.9 mmol/L — ABNORMAL LOW (ref 3.5–5.1)
Sodium: 133 mmol/L — ABNORMAL LOW (ref 135–145)

## 2015-08-29 LAB — TYPE AND SCREEN
ABO/RH(D): A POS
Antibody Screen: NEGATIVE

## 2015-08-29 LAB — CBC
HCT: 34.5 % — ABNORMAL LOW (ref 36.0–46.0)
Hemoglobin: 11.5 g/dL — ABNORMAL LOW (ref 12.0–15.0)
MCH: 26.9 pg (ref 26.0–34.0)
MCHC: 33.3 g/dL (ref 30.0–36.0)
MCV: 80.8 fL (ref 78.0–100.0)
Platelets: 408 10*3/uL — ABNORMAL HIGH (ref 150–400)
RBC: 4.27 MIL/uL (ref 3.87–5.11)
RDW: 16.9 % — ABNORMAL HIGH (ref 11.5–15.5)
WBC: 8.4 10*3/uL (ref 4.0–10.5)

## 2015-08-29 NOTE — Telephone Encounter (Signed)
Got a call from womens wanting to see her EKG for their preop, faxed over to tamika [phone]

## 2015-08-29 NOTE — Pre-Procedure Instructions (Signed)
Dr. Roberts Gaudy made aware of patient's potassium of 2.9 and glucose of 241 today at preop appointment.  Patient called and instructed to increase potassium to 20 mg daily beginning 09/05/2015.  We will repeat BMP day of surgery.  Patient also instructed to take full bedtime insulin dose night before surgery, per Dr. Linna Caprice.  Patient has only been on the Lantus for 5 days and will be having a follow up appointment with Dorothea Ogle on Monday, September 04, 2015.

## 2015-08-30 ENCOUNTER — Ambulatory Visit (INDEPENDENT_AMBULATORY_CARE_PROVIDER_SITE_OTHER): Payer: BLUE CROSS/BLUE SHIELD | Admitting: Family Medicine

## 2015-08-30 ENCOUNTER — Encounter: Payer: Self-pay | Admitting: Family Medicine

## 2015-08-30 VITALS — BP 142/80 | HR 64 | Wt 149.8 lb

## 2015-08-30 DIAGNOSIS — E876 Hypokalemia: Secondary | ICD-10-CM | POA: Diagnosis not present

## 2015-08-30 DIAGNOSIS — E118 Type 2 diabetes mellitus with unspecified complications: Secondary | ICD-10-CM | POA: Diagnosis not present

## 2015-08-30 NOTE — Patient Instructions (Addendum)
Increase Lantus to 20 units and if your blood sugar tomorrow morning is <130, stay at that dose. Call me Thursday afternoon and let me know what your blood sugars have been since I saw you.   Follow up with Audelia Acton next week.  Eat small frequent meals and avoid skipping meals.   Basic Carbohydrate Counting for Diabetes Mellitus Carbohydrate counting is a method for keeping track of the amount of carbohydrates you eat. Eating carbohydrates naturally increases the level of sugar (glucose) in your blood, so it is important for you to know the amount that is okay for you to have in every meal. Carbohydrate counting helps keep the level of glucose in your blood within normal limits. The amount of carbohydrates allowed is different for every person. A dietitian can help you calculate the amount that is right for you. Once you know the amount of carbohydrates you can have, you can count the carbohydrates in the foods you want to eat. Carbohydrates are found in the following foods:  Grains, such as breads and cereals.  Dried beans and soy products.  Starchy vegetables, such as potatoes, peas, and corn.  Fruit and fruit juices.  Milk and yogurt.  Sweets and snack foods, such as cake, cookies, candy, chips, soft drinks, and fruit drinks. CARBOHYDRATE COUNTING There are two ways to count the carbohydrates in your food. You can use either of the methods or a combination of both. Reading the "Nutrition Facts" on [place] The "Nutrition Facts" is an area that is included on the labels of almost all packaged food and beverages in the [place]. It includes the serving size of that food or beverage and information about the nutrients in each serving of the food, including the grams (g) of carbohydrate per serving.  Decide the number of servings of this food or beverage that you will be able to eat or drink. Multiply that number of servings by the number of grams of carbohydrate that is listed on the  label for that serving. The total will be the amount of carbohydrates you will be having when you eat or drink this food or beverage. Learning Standard Serving Sizes of Food When you eat food that is not packaged or does not include "Nutrition Facts" on the label, you need to measure the servings in order to count the amount of carbohydrates.A serving of most carbohydrate-rich foods contains about 15 g of carbohydrates. The following list includes serving sizes of carbohydrate-rich foods that provide 15 g ofcarbohydrate per serving:   1 slice of bread (1 oz) or 1 six-inch tortilla.    of a hamburger bun or English muffin.  4-6 crackers.   cup unsweetened dry cereal.    cup hot cereal.   cup rice or pasta.    cup mashed potatoes or  of a large baked potato.  1 cup fresh fruit or one small piece of fruit.    cup canned or frozen fruit or fruit juice.  1 cup milk.   cup plain fat-free yogurt or yogurt sweetened with artificial sweeteners.   cup cooked dried beans or starchy vegetable, such as peas, corn, or potatoes.  Decide the number of standard-size servings that you will eat. Multiply that number of servings by 15 (the grams of carbohydrates in that serving). For example, if you eat 2 cups of strawberries, you will have eaten 2 servings and 30 g of carbohydrates (2 servings x 15 g = 30 g). For foods such as soups and  casseroles, in which more than one food is mixed in, you will need to count the carbohydrates in each food that is included. EXAMPLE OF CARBOHYDRATE COUNTING Sample Dinner  3 oz chicken breast.   cup of brown rice.   cup of corn.  1 cup milk.   1 cup strawberries with sugar-free whipped topping.  Carbohydrate Calculation Step 1: Identify the foods that contain carbohydrates:   Rice.   Corn.   Milk.   Strawberries. Step 2:Calculate the number of servings eaten of each:   2 servings of rice.   1 serving of corn.   1 serving  of milk.   1 serving of strawberries. Step 3: Multiply each of those number of servings by 15 g:   2 servings of rice x 15 g = 30 g.   1 serving of corn x 15 g = 15 g.   1 serving of milk x 15 g = 15 g.   1 serving of strawberries x 15 g = 15 g. Step 4: Add together all of the amounts to find the total grams of carbohydrates eaten: 30 g + 15 g + 15 g + 15 g = 75 g.   This information is not intended to replace advice given to you by your health care provider. Make sure you discuss any questions you have with your health care provider.   Document Released: 05/06/2005 Document Revised: 05/27/2014 Document Reviewed: 04/02/2013 Elsevier Interactive Patient Education [employer].

## 2015-08-30 NOTE — Progress Notes (Signed)
   Subjective:    Patient ID: Tina Horton, female    DOB: 17-Jan-1966, 50 y.o.   MRN: [id number]  HPI Chief Complaint  Patient presents with  . sugars running high    sugars running high, highest 4/10 was 401 and then lowest was this morning 150 at 10am fasting   She is here with complaints of elevated blood sugars. States her blood sugar was 401 a few days ago and states she was taking only 10 units of Lantus at that time. States she has been increasing her Lantus dose as discussed with Shane. Last night she took 18 units of Lantus and then today her blood sugar was 150 before breakfast. Feels like Lantus is working. No concerns or side effects. She states she just started checking her blood sugars about one week ago and is supposed to be checking her blood sugar 3 times a day.   States she cannot take Metformin due to GI upset and that Glipizide wasn't working so it was stopped. She has also tried Tanzeum in past but had adverse reaction per patient and it was too expensive.  She does not count her carbs but states she has seen nutritionist in past and is aware that she should only have 40 carbs per meal.   She is aware that her potassium is low (2.9) from lab draw yesterday and states she was told to double up on her potassium pills until she is seen again next week. She is scheduled to have a hysterectomy next week.  She denies fever, chills, headache, chest pain, palpitations, shortness of breath, nausea, vomiting, abdominal pain. States no GI or GU symptoms  Denies other concerns or complaints today.     Review of Systems Pertinent positives and negatives in the history of present illness.     Objective:   Physical Exam BP 142/80 mmHg  Pulse 64  Wt 149 lb 12.8 oz (67.949 kg)  LMP 08/15/2015 (Exact Date)  Alert and oriented and in no distress. Not otherwise examined.       Assessment & Plan:  Diabetes mellitus with complication in adult patient [facility])  Review of her labs  shows potassium of 2.9 yesterday, she is doubling her potassium supplement and taking 20 meq. She will need to have this rechecked next week prior to surgery. She is asymptomatic today.  Recommend that she increase her Lantus to 20 units tonight and if her blood sugar is < 130 fasting in the morning then she will keep it at that dose. She will check her blood sugar 2 more times today and then fasting in the morning and 2 hours after lunch tomorrow and call me tomorrow afternoon with results. We will determine dosing for the weekend and she will follow up with [facility] as scheduled on Monday.

## 2015-08-31 ENCOUNTER — Other Ambulatory Visit (HOSPITAL_COMMUNITY): Payer: BLUE CROSS/BLUE SHIELD

## 2015-08-31 ENCOUNTER — Telehealth: Payer: Self-pay | Admitting: Family Medicine

## 2015-08-31 ENCOUNTER — Ambulatory Visit (INDEPENDENT_AMBULATORY_CARE_PROVIDER_SITE_OTHER): Payer: BLUE CROSS/BLUE SHIELD

## 2015-08-31 DIAGNOSIS — R011 Cardiac murmur, unspecified: Secondary | ICD-10-CM

## 2015-08-31 DIAGNOSIS — Z01818 Encounter for other preprocedural examination: Secondary | ICD-10-CM | POA: Diagnosis not present

## 2015-08-31 LAB — EXERCISE TOLERANCE TEST
Estimated workload: 8.5 METS
Exercise duration (min): 7 min
Exercise duration (sec): 0 s
MPHR: 170 {beats}/min
Peak HR: 160 {beats}/min
Percent HR: 94 %
Percent of predicted max HR: 94 %
RPE: 17
Rest HR: 98 {beats}/min
Stage 1 DBP: 77 mmHg
Stage 1 Grade: 0 %
Stage 1 HR: 103 {beats}/min
Stage 1 SBP: 134 mmHg
Stage 1 Speed: 0 mph
Stage 2 Grade: 0 %
Stage 2 HR: 103 {beats}/min
Stage 2 Speed: 1 mph
Stage 3 Grade: 0 %
Stage 3 HR: 103 {beats}/min
Stage 3 Speed: 1 mph
Stage 4 DBP: 78 mmHg
Stage 4 Grade: 10 %
Stage 4 HR: 129 {beats}/min
Stage 4 SBP: 180 mmHg
Stage 4 Speed: 1.7 mph
Stage 5 DBP: 90 mmHg
Stage 5 Grade: 12 %
Stage 5 HR: 150 {beats}/min
Stage 5 SBP: 188 mmHg
Stage 5 Speed: 2.5 mph
Stage 6 Grade: 14 %
Stage 6 HR: 160 {beats}/min
Stage 6 Speed: 3.4 mph
Stage 7 DBP: 84 mmHg
Stage 7 Grade: 0 %
Stage 7 HR: 142 {beats}/min
Stage 7 SBP: 188 mmHg
Stage 7 Speed: 1.5 mph
Stage 8 DBP: 70 mmHg
Stage 8 Grade: 0 %
Stage 8 HR: 109 {beats}/min
Stage 8 SBP: 131 mmHg
Stage 8 Speed: 0 mph

## 2015-08-31 NOTE — Telephone Encounter (Signed)
Please tell her I recommend that she continue at 20 units of Lantus until she returns to see Audelia Acton on Monday. Also, as I discussed with her in the office, I recommend that she not skip meals. Audelia Acton may want to add a second diabetes medication or consider mealtime coverage.  Also, make sure she is continuing to double up on her potassium supplement and she should do this until she returns as well.

## 2015-08-31 NOTE — Telephone Encounter (Signed)
Pt is aware.  

## 2015-08-31 NOTE — Telephone Encounter (Signed)
Pt called and stated that she was to call back with her blood sugar readings. She states that last night after not eating all day at 8:15 it was 115. Two hours after dinner it was 288. She ate a burrito bowl with black beans, brown rice, corn, mild salsa and guacamole. She also had 1/2 a slice of honey passover bread. She states that she drank plenty of water all day. This morning on a empty stomach at 8 AM it was 220. Pt can be reached at [phone].

## 2015-09-01 ENCOUNTER — Other Ambulatory Visit: Payer: Self-pay | Admitting: Medical

## 2015-09-04 ENCOUNTER — Ambulatory Visit (HOSPITAL_COMMUNITY)
Admission: RE | Admit: 2015-09-04 | Discharge: 2015-09-04 | Disposition: A | Discharge status: Home / Self Care | Payer: BLUE CROSS/BLUE SHIELD | Source: Ambulatory Visit | Attending: Cardiology | Admitting: Cardiology

## 2015-09-04 ENCOUNTER — Ambulatory Visit: Payer: Self-pay | Admitting: Medical

## 2015-09-04 ENCOUNTER — Encounter: Payer: Self-pay | Admitting: Medical

## 2015-09-04 ENCOUNTER — Ambulatory Visit (INDEPENDENT_AMBULATORY_CARE_PROVIDER_SITE_OTHER): Payer: BLUE CROSS/BLUE SHIELD | Admitting: Medical

## 2015-09-04 VITALS — BP 124/70 | HR 97 | Wt 146.0 lb

## 2015-09-04 DIAGNOSIS — R011 Cardiac murmur, unspecified: Secondary | ICD-10-CM | POA: Diagnosis not present

## 2015-09-04 DIAGNOSIS — Z9119 Patient's noncompliance with other medical treatment and regimen: Secondary | ICD-10-CM

## 2015-09-04 DIAGNOSIS — E118 Type 2 diabetes mellitus with unspecified complications: Secondary | ICD-10-CM

## 2015-09-04 DIAGNOSIS — I10 Essential (primary) hypertension: Secondary | ICD-10-CM

## 2015-09-04 DIAGNOSIS — E785 Hyperlipidemia, unspecified: Secondary | ICD-10-CM | POA: Diagnosis not present

## 2015-09-04 DIAGNOSIS — Z91199 Patient's noncompliance with other medical treatment and regimen due to unspecified reason: Secondary | ICD-10-CM

## 2015-09-04 DIAGNOSIS — E041 Nontoxic single thyroid nodule: Secondary | ICD-10-CM

## 2015-09-04 LAB — ECHOCARDIOGRAM COMPLETE: Weight: 2336 oz

## 2015-09-04 MED ORDER — INSULIN GLARGINE 100 UNIT/ML SOLOSTAR PEN: 20.0000 [IU] | PEN_INJECTOR | Freq: Every day | SUBCUTANEOUS | Status: DC

## 2015-09-04 MED ORDER — POTASSIUM CHLORIDE CRYS ER 20 MEQ PO TBCR: 20.0000 meq | EXTENDED_RELEASE_TABLET | Freq: Two times a day (BID) | ORAL | Status: DC

## 2015-09-04 NOTE — Progress Notes (Signed)
Subjective: Chief Complaint  Patient presents with  . Follow-up    on blood sugars. was 104 this morning so they are improving. 20 unit of lantus nightly.    Here to f/u from recent preop surgery clearance physical.  At that time she was again noncompliant and had to reiterate need to get disciplined about her health.  She has restarted medications as discussed, has made significant changes in diet and exercise since last visit, is monitoring glucose and recent glucose readings have been fine.  She is not having any issues with the insulin recently added.  She has f/u with cardiology today regarding murmur.  Still awaiting surgery clearance.  Past Medical History  Diagnosis Date  . Hypertension   . Myopia   . Glaucoma     Dr. Einar Gip  . GERD (gastroesophageal reflux disease)   . H/O mammogram 2005  . Routine gynecological examination     last pap 2005  . Hyperlipidemia   . Diabetes mellitus without complication [facility])     age 50yo  . Legally blind   . Heart murmur   . Intermittent palpitations   . Shortness of breath dyspnea   . Thyroid nodule   . Headache   . Sinusitis   . Pneumonia   . Bronchitis   . Seasonal allergies   . Arthritis     knees  . Anemia     ROS as in subjective   Objective: BP 124/70 mmHg  Pulse 97  Wt 146 lb (66.225 kg)  LMP 08/15/2015 (Exact Date)   Gen: wd, wn, nad Otherwise not examined   Assessment: Encounter Diagnoses  Name Primary?  . Diabetes mellitus with complication in adult patient ([place]) Yes  . Essential hypertension   . Thyroid nodule   . Heart murmur   . Noncompliance   . Hyperlipidemia     Plan: BP much improved.  C/t Amlodipine 10mg  daily, Lisinopril HCT, and f/u with cardiology today as planned.  awaiting clearance from cardiology  Diabetes - c/t glucose monitoring, Lantus 20 u QHS, and f/u in 78mo  Thyroid nodule - will eval this further after surgery is complete  hyperlipemia - c/t pravastatin and ASA  QHS  Sonnet was seen today for follow-up.  Diagnoses and all orders for this visit:  Diabetes mellitus with complication in adult patient [facility])  Essential hypertension  Thyroid nodule  Heart murmur  Noncompliance  Hyperlipidemia  Other orders -     potassium chloride SA (KLOR-CON M20) 20 MEQ tablet; Take 1 tablet (20 mEq total) by mouth 2 (two) times daily. -     Insulin Glargine (LANTUS) 100 UNIT/ML Solostar Pen; Inject 20 Units into the skin daily at 10 pm.

## 2015-09-05 ENCOUNTER — Telehealth: Payer: Self-pay | Admitting: Medical

## 2015-09-05 NOTE — Telephone Encounter (Signed)
I reviewed the echocardiogram results from yesterday.  Please submit form to ortho that she is clear for surgery

## 2015-09-06 NOTE — Telephone Encounter (Signed)
Pt is aware form faxed

## 2015-09-07 NOTE — Anesthesia Preprocedure Evaluation (Addendum)
Anesthesia Evaluation  Patient identified by MRN, date of birth, ID band Patient awake    Reviewed: Allergy & Precautions, NPO status , Patient's Chart, lab work & pertinent test results  Airway Mallampati: II   Neck ROM: Full    Dental  (+) Teeth Intact, Dental Advisory Given,    Pulmonary neg pulmonary ROS,    breath sounds clear to auscultation       Cardiovascular hypertension, Pt. on medications negative cardio ROS   Rhythm:Regular  ECHO 08/2015 EF 65%, normal wall motion, normal valves, Low risk stress 08/2015   Neuro/Psych negative neurological ROS  negative psych ROS   GI/Hepatic negative GI ROS, Neg liver ROS, GERD  Medicated,  Endo/Other  diabetes, Poorly Controlled, Insulin Dependent  Renal/GU negative Renal ROS  negative genitourinary   Musculoskeletal negative musculoskeletal ROS (+) Arthritis ,   Abdominal (+)  Abdomen: soft.    Peds negative pediatric ROS (+)  Hematology negative hematology ROS (+) 11/35   Anesthesia Other Findings K 2.9 on 08/29/2015 was told to double up on her K dose. To be re checked morning of surgery  Reproductive/Obstetrics negative OB ROS                          Anesthesia Physical Anesthesia Plan  ASA: II  Anesthesia Plan: General   Post-op Pain Management:    Induction: Intravenous  Airway Management Planned: Oral ETT  Additional Equipment:   Intra-op Plan:   Post-operative Plan: Extubation in OR  Informed Consent: I have reviewed the patients History and Physical, chart, labs and discussed the procedure including the risks, benefits and alternatives for the proposed anesthesia with the patient or authorized representative who has indicated his/her understanding and acceptance.     Plan Discussed with:   Anesthesia Plan Comments: (Review am labs especially K+, K+ today 3.4)       Anesthesia Quick Evaluation

## 2015-09-08 ENCOUNTER — Ambulatory Visit (INDEPENDENT_AMBULATORY_CARE_PROVIDER_SITE_OTHER): Payer: BLUE CROSS/BLUE SHIELD | Admitting: Medical

## 2015-09-08 ENCOUNTER — Encounter: Payer: Self-pay | Admitting: Medical

## 2015-09-08 VITALS — BP 114/70 | HR 78 | Temp 98.3°F | Resp 12 | Ht 62.0 in | Wt 144.8 lb

## 2015-09-08 DIAGNOSIS — R110 Nausea: Secondary | ICD-10-CM

## 2015-09-08 DIAGNOSIS — J029 Acute pharyngitis, unspecified: Secondary | ICD-10-CM | POA: Diagnosis not present

## 2015-09-08 LAB — POCT RAPID STREP A (OFFICE): Rapid Strep A Screen: NEGATIVE

## 2015-09-08 NOTE — Progress Notes (Signed)
Subjective: Chief Complaint  Patient presents with  . Sore Throat    S/T x 3 days   . Nausea    started this morning   Here with scratchy throat that is now sore throat, having some headache, sneezing, some cough, some nausea, fatigue.  Using ricola cough drops.   Brother has similar symptoms.   Surgery is planned for next Wednesday.  Surgery is hysterectomy.  No fever.  No other aggravating or relieving factors. No other complaint.  Past Medical History  Diagnosis Date  . Hypertension   . Myopia   . Glaucoma     Dr. Einar Gip  . GERD (gastroesophageal reflux disease)   . H/O mammogram 2005  . Routine gynecological examination     last pap 2005  . Hyperlipidemia   . Diabetes mellitus without complication [facility])     age 50yo  . Legally blind   . Heart murmur   . Intermittent palpitations   . Shortness of breath dyspnea   . Thyroid nodule   . Headache   . Sinusitis   . Pneumonia   . Bronchitis   . Seasonal allergies   . Arthritis     knees  . Anemia    ROS as in subjective  Objective: BP 114/70 mmHg  Pulse 78  Temp(Src) 98.3 F (36.8 C) (Oral)  Resp 12  Ht 5\' 2"  (1.575 m)  Wt 144 lb 12.8 oz (65.681 kg)  BMI 26.48 kg/m2  SpO2 99%  LMP 08/15/2015 (Exact Date)  General appearance: alert, no distress, WD/WN HEENT: normocephalic, sclerae anicteric, TMs pearly, nares patent, no discharge or erythema, pharynx with mild erythema Oral cavity: MMM, no lesions Neck: supple, no lymphadenopathy, no thyromegaly, no masses Lungs: CTA bilaterally, no wheezes, rhonchi, or rales   Assessment: Encounter Diagnoses  Name Primary?  . Sore throat Yes  . Nausea     Plan: Strep and DNA strep probe taken.  Strep negative.  discussed symptoms, and exam currently suggest viral pharyngitis.  Discussed supportive care, rest, salt water gargles hydration, can use Tylenol for pain, ricola drops OTC as she is doing, and if worse or not improving by Monday, let us know.

## 2015-09-08 NOTE — Addendum Note (Signed)
Addended by: Minette Headland A on: 09/08/2015 09:18 AM   Modules accepted: Orders, SmartSet

## 2015-09-09 LAB — STREP A DNA PROBE: GASP: NOT DETECTED

## 2015-09-12 NOTE — Pre-Procedure Instructions (Signed)
Patient called an informed me that she has had loose diarrhea today, stated she had a couple of bites of a tuna sandwich that tasted "funny".  Left a message for Myrene at Dr. Andy Gauss office to make them aware.

## 2015-09-12 NOTE — H&P (Signed)
History of Present Illness  General:          Pt presents for preop for LAVH/BS due to menorrhagia, fibroids, dysmenorrnea.        Pt with a h/o diabetes. Not optimally controlled but pt states her PCP is increasing her medication to get improve glucose control. Feels she will likely be cleared for surgery.         Her PCP her a murmer. She will be evaluated by a cardiologist in less than 1 week. Pt denies chest pain, SOB, LE swelling.        Pt with significant prolapse. Reports cervix at introitus but denies pain or abnormal discharge.    Current Medications  TakingAspir-81(Aspirin) 81 MG Tablet Delayed Release 1 tablet Orally Once a day    Latanoprost 0.005 % Solution 1 drop into affected eye in the evening Ophthalmic Once a day    Lisinopril-Hydrochlorothiazide 20-25 MG Tablet 1 tablet Orally Once a day    Ibuprofen 600 MG Tablet 1 tablet Orally every 6 hrs    Integra 62.5-62.5-40-3 MG Capsule 1 capsule Orally Once a day    Lantus(Insulin Glargine) 100 UNIT/ML Solution Subcutaneous 20 units    Klor-Con 10(Potassium Chloride) 10 MEQ Tablet Extended Release 1 tablet with food Orally Twice a day    Pravachol(Pravastatin Sodium) 20 MG Tablet 1 tablet Orally Once a day    Not-Taking/PRNGlipiZIDE 5 MG Tablet 1 tablet Orally Once a day    Glucotrol(GlipiZIDE) 5 MG Tablet 1 tablet Orally Once a day    Lysteda(Tranexamic Acid) 650 MG Tablet 2 tablets Orally TID up to 5 days with menses    Medication List reviewed and reconciled with the patient        Past Medical History       Diabetes Type 2.        Glaucoma @ 5.        HTN.        Myopia Degeneration- legally blind.            Surgical History  a small cyst removed from abdomen (superficial)       Family History  Father: deceased, diagnosed with HTN  Mother: deceased, diagnosed with DM, HTN  denies any GYN family cancer hx.      Social History  General:         Tobacco use               cigarettes:  Never  smoked             Tobacco history last updated  08/31/2015       no Alcohol.        no Recreational drug use.        Exercise: yes, walks.        Marital Status: married.        Children: girls, 2, Boys, 1.     Gyn History  Sexual activity currently sexually active.   Periods : every month, heavy bleeding, cramping, and passing lots of clots.   LMP 06/21/2015.   Denies H/O Birth control.   Last pap smear date 06/2015, negative.   Last mammogram date more than two years.   Denies H/O Abnormal pap smear.      OB History  Number of pregnancies  3.   Pregnancy # 1  live birth, vaginal delivery.   Pregnancy # 2  live birth, vaginal delivery.   Pregnancy # 3  live birth, vaginal delivery.  Allergies  metformin: diarrhea  Tanzeum: vomiting      Hospitalization/Major Diagnostic Procedure  childbirth x 3       Review of Systems  Denies fever/chills, chest pain, SOB, headaches, numbness/tingling. No h/o complication with anesthesia, bleeding disorders or blood clots.    Vital Signs  Wt 149, Wt change -2 lb, Ht 61.75, BMI 27.47, Pulse sitting 94, BP sitting 134/78.    Physical Examination  GENERAL:          Patient appears  alert and oriented.          General Appearance:  well-appearing, well-developed, no acute distress.          Speech:  clear.   LUNGS:          Auscultation:  no wheezing/rhonchi/rales. CTA bilaterally.   HEART:          Heart sounds:  normal. RRR. no murmur.   ABDOMEN:          General:  soft nontender, nondistended, no masses.   FEMALE GENITOURINARY:          Pelvic  Cervix at introitus. White discharge c/w yeast. Grade 2 cystocele.[name]   EXTREMITIES:          General:  No edema or calf tenderness.           Assessments  1. Pre-operative clearance - Z01.818 (Primary)  2. 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 Subacute vaginitis - N76.1  4. Menorrhagia - N92.0    Treatment  1. Pre-operative clearance   Notes: Proceed with LAVH/BS, anterior repair  prn.. Pt counseled on risk of bleeding, infection and injury to internal organs. Also informed surgery may be converted to TAH if excessive scar tissue. Pt consents to blood transfusion if needed. All questions answered. Consent obtained.      2. Subacute vaginitis        LAB: [facility]        Lab: [facility]   +Yeast, rare       WBCS Greatly increased A Normal -        CLUE CELLS None seen  None seen -        TRICHOMONAS None Seen  None Seen -        YEAST Rare A None seen -                 Myan Suit B 08/31/2015 01:59:27 PM > Yeast present. Recommend treatment with Terazol 7 one applicatorful PVqhs x 7 days. Allman,Michelle 08/31/2015 02:39:59 PM > Pt infomred and meds sent.              Procedure Codes  [id number] ECL WET PREP      Follow Up  2 Weeks post op

## 2015-09-13 ENCOUNTER — Encounter (HOSPITAL_COMMUNITY)
Admission: RE | Disposition: A | Discharge status: Home / Self Care | Payer: Self-pay | Source: Ambulatory Visit | Attending: Obstetrics and Gynecology

## 2015-09-13 ENCOUNTER — Ambulatory Visit (HOSPITAL_COMMUNITY): Payer: BLUE CROSS/BLUE SHIELD | Admitting: Anesthesiology

## 2015-09-13 ENCOUNTER — Ambulatory Visit (HOSPITAL_COMMUNITY)
Admission: RE | Admit: 2015-09-13 | Discharge: 2015-09-14 | Disposition: A | Discharge status: Home / Self Care | Payer: BLUE CROSS/BLUE SHIELD | Source: Ambulatory Visit | Attending: Obstetrics and Gynecology | Admitting: Obstetrics and Gynecology

## 2015-09-13 ENCOUNTER — Encounter (HOSPITAL_COMMUNITY): Payer: Self-pay | Admitting: Emergency Medicine

## 2015-09-13 DIAGNOSIS — N8110 Cystocele, unspecified: Secondary | ICD-10-CM | POA: Diagnosis not present

## 2015-09-13 DIAGNOSIS — Z7982 Long term (current) use of aspirin: Secondary | ICD-10-CM | POA: Insufficient documentation

## 2015-09-13 DIAGNOSIS — N840 Polyp of corpus uteri: Secondary | ICD-10-CM | POA: Diagnosis not present

## 2015-09-13 DIAGNOSIS — N920 Excessive and frequent menstruation with regular cycle: Principal | ICD-10-CM | POA: Insufficient documentation

## 2015-09-13 DIAGNOSIS — K219 Gastro-esophageal reflux disease without esophagitis: Secondary | ICD-10-CM | POA: Diagnosis not present

## 2015-09-13 DIAGNOSIS — Z794 Long term (current) use of insulin: Secondary | ICD-10-CM | POA: Diagnosis not present

## 2015-09-13 DIAGNOSIS — M1990 Unspecified osteoarthritis, unspecified site: Secondary | ICD-10-CM | POA: Insufficient documentation

## 2015-09-13 DIAGNOSIS — Z888 Allergy status to other drugs, medicaments and biological substances status: Secondary | ICD-10-CM | POA: Diagnosis not present

## 2015-09-13 DIAGNOSIS — Z90710 Acquired absence of both cervix and uterus: Secondary | ICD-10-CM | POA: Diagnosis present

## 2015-09-13 DIAGNOSIS — D251 Intramural leiomyoma of uterus: Secondary | ICD-10-CM | POA: Diagnosis not present

## 2015-09-13 DIAGNOSIS — E119 Type 2 diabetes mellitus without complications: Secondary | ICD-10-CM | POA: Diagnosis not present

## 2015-09-13 DIAGNOSIS — I10 Essential (primary) hypertension: Secondary | ICD-10-CM | POA: Diagnosis not present

## 2015-09-13 DIAGNOSIS — N946 Dysmenorrhea, unspecified: Secondary | ICD-10-CM | POA: Insufficient documentation

## 2015-09-13 HISTORY — PX: LAPAROSCOPIC VAGINAL HYSTERECTOMY WITH SALPINGECTOMY: SHX6680

## 2015-09-13 LAB — BASIC METABOLIC PANEL
Anion gap: 11 (ref 5–15)
BUN: 17 mg/dL (ref 6–20)
CO2: 25 mmol/L (ref 22–32)
Calcium: 9.6 mg/dL (ref 8.9–10.3)
Chloride: 99 mmol/L — ABNORMAL LOW (ref 101–111)
Creatinine, Ser: 1.04 mg/dL — ABNORMAL HIGH (ref 0.44–1.00)
GFR calc Af Amer: >60 mL/min (ref >60)
GFR calc non Af Amer: >60 mL/min (ref >60)
Glucose, Bld: 128 mg/dL — ABNORMAL HIGH (ref 65–99)
Potassium: 3.4 mmol/L — ABNORMAL LOW (ref 3.5–5.1)
Sodium: 135 mmol/L (ref 135–145)

## 2015-09-13 LAB — GLUCOSE, CAPILLARY
Glucose-Capillary: 104 mg/dL — ABNORMAL HIGH (ref 65–99)
Glucose-Capillary: 149 mg/dL — ABNORMAL HIGH (ref 65–99)
Glucose-Capillary: 174 mg/dL — ABNORMAL HIGH (ref 65–99)

## 2015-09-13 LAB — PREGNANCY, URINE: Preg Test, Ur: NEGATIVE

## 2015-09-13 SURGERY — HYSTERECTOMY, VAGINAL, LAPAROSCOPY-ASSISTED, WITH SALPINGECTOMY
Anesthesia: General | Site: Abdomen

## 2015-09-13 MED ORDER — ONDANSETRON HCL 4 MG PO TABS: 4.0000 mg | ORAL_TABLET | Freq: Four times a day (QID) | ORAL | Status: DC | PRN

## 2015-09-13 MED ORDER — VASOPRESSIN 20 UNIT/ML IV SOLN
INTRAVENOUS | Status: AC
  Filled 2015-09-13: qty 1

## 2015-09-13 MED ORDER — SIMETHICONE 80 MG PO CHEW: 80.0000 mg | CHEWABLE_TABLET | Freq: Four times a day (QID) | ORAL | Status: DC | PRN

## 2015-09-13 MED ORDER — CEFAZOLIN SODIUM-DEXTROSE 2-3 GM-% IV SOLR
INTRAVENOUS | Status: AC
  Filled 2015-09-13: qty 50

## 2015-09-13 MED ORDER — ACETAMINOPHEN 10 MG/ML IV SOLN
1000.0000 mg | Freq: Once | INTRAVENOUS | Status: AC
  Administered 2015-09-13: 1000 mg via INTRAVENOUS
  Filled 2015-09-13: qty 100

## 2015-09-13 MED ORDER — BUPIVACAINE-EPINEPHRINE 0.25% -1:200000 IJ SOLN: INTRAMUSCULAR | Status: DC | PRN

## 2015-09-13 MED ORDER — POTASSIUM CHLORIDE CRYS ER 20 MEQ PO TBCR
20.0000 meq | EXTENDED_RELEASE_TABLET | Freq: Two times a day (BID) | ORAL | Status: DC
  Administered 2015-09-13 – 2015-09-14 (×2): 20 meq via ORAL
  Filled 2015-09-13 (×2): qty 1

## 2015-09-13 MED ORDER — FENTANYL CITRATE (PF) 100 MCG/2ML IJ SOLN
INTRAMUSCULAR | Status: AC
  Administered 2015-09-13: 25 ug via INTRAVENOUS
  Filled 2015-09-13: qty 2

## 2015-09-13 MED ORDER — FENTANYL CITRATE (PF) 100 MCG/2ML IJ SOLN
25.0000 ug | INTRAMUSCULAR | Status: DC | PRN
  Administered 2015-09-13: 25 ug via INTRAVENOUS
  Administered 2015-09-13: 50 ug via INTRAVENOUS
  Administered 2015-09-13: 25 ug via INTRAVENOUS
  Administered 2015-09-13: 50 ug via INTRAVENOUS

## 2015-09-13 MED ORDER — DEXAMETHASONE SODIUM PHOSPHATE 4 MG/ML IJ SOLN
INTRAMUSCULAR | Status: AC
  Filled 2015-09-13: qty 1

## 2015-09-13 MED ORDER — ESTRADIOL 0.1 MG/GM VA CREA
TOPICAL_CREAM | VAGINAL | Status: DC | PRN
  Administered 2015-09-13: 1 via VAGINAL

## 2015-09-13 MED ORDER — ROCURONIUM BROMIDE 100 MG/10ML IV SOLN
INTRAVENOUS | Status: AC
  Filled 2015-09-13: qty 1

## 2015-09-13 MED ORDER — HYDROMORPHONE 1 MG/ML IV SOLN
INTRAVENOUS | Status: DC
  Administered 2015-09-13: 2.7 mg via INTRAVENOUS
  Administered 2015-09-13: 19:00:00 via INTRAVENOUS
  Administered 2015-09-14: 2.1 mg via INTRAVENOUS
  Administered 2015-09-14: 0.9 mg via INTRAVENOUS
  Administered 2015-09-14: 1.5 mg via INTRAVENOUS
  Filled 2015-09-13: qty 25

## 2015-09-13 MED ORDER — LACTATED RINGERS IV SOLN
INTRAVENOUS | Status: DC
  Administered 2015-09-13 (×3): via INTRAVENOUS

## 2015-09-13 MED ORDER — LIDOCAINE-EPINEPHRINE 1 %-1:100000 IJ SOLN
INTRAMUSCULAR | Status: AC
  Filled 2015-09-13: qty 1

## 2015-09-13 MED ORDER — EPHEDRINE SULFATE 50 MG/ML IJ SOLN
INTRAMUSCULAR | Status: DC | PRN
Start: 2015-09-13 — End: 2015-09-13
  Administered 2015-09-13: 5 mg via INTRAVENOUS

## 2015-09-13 MED ORDER — GLYCOPYRROLATE 0.2 MG/ML IJ SOLN
INTRAMUSCULAR | Status: AC
  Filled 2015-09-13: qty 3

## 2015-09-13 MED ORDER — SODIUM CHLORIDE 0.9 % IJ SOLN
INTRAMUSCULAR | Status: AC
  Filled 2015-09-13: qty 50

## 2015-09-13 MED ORDER — HYDROCHLOROTHIAZIDE 25 MG PO TABS: 25.0000 mg | ORAL_TABLET | Freq: Every day | ORAL | Status: DC

## 2015-09-13 MED ORDER — ONDANSETRON HCL 4 MG/2ML IJ SOLN
INTRAMUSCULAR | Status: DC | PRN
Start: 2015-09-13 — End: 2015-09-13
  Administered 2015-09-13: 4 mg via INTRAVENOUS

## 2015-09-13 MED ORDER — DOCUSATE SODIUM 100 MG PO CAPS
100.0000 mg | ORAL_CAPSULE | Freq: Two times a day (BID) | ORAL | Status: DC
  Administered 2015-09-13 – 2015-09-14 (×2): 100 mg via ORAL
  Filled 2015-09-13 (×2): qty 1

## 2015-09-13 MED ORDER — PROPOFOL 10 MG/ML IV BOLUS
INTRAVENOUS | Status: DC | PRN
  Administered 2015-09-13: 200 mg via INTRAVENOUS

## 2015-09-13 MED ORDER — CEFAZOLIN SODIUM-DEXTROSE 2-4 GM/100ML-% IV SOLN
2.0000 g | INTRAVENOUS | Status: AC
  Administered 2015-09-13: 2 g via INTRAVENOUS
  Filled 2015-09-13: qty 100

## 2015-09-13 MED ORDER — MIDAZOLAM HCL 2 MG/2ML IJ SOLN
INTRAMUSCULAR | Status: AC
  Filled 2015-09-13: qty 2

## 2015-09-13 MED ORDER — FENTANYL CITRATE (PF) 100 MCG/2ML IJ SOLN
INTRAMUSCULAR | Status: DC | PRN
  Administered 2015-09-13 (×2): 50 ug via INTRAVENOUS
  Administered 2015-09-13: 100 ug via INTRAVENOUS

## 2015-09-13 MED ORDER — LIDOCAINE-EPINEPHRINE 2 %-1:100000 IJ SOLN
INTRAMUSCULAR | Status: AC
  Filled 2015-09-13: qty 1

## 2015-09-13 MED ORDER — PROPOFOL 10 MG/ML IV BOLUS
INTRAVENOUS | Status: AC
  Filled 2015-09-13: qty 20

## 2015-09-13 MED ORDER — DIPHENHYDRAMINE HCL 12.5 MG/5ML PO ELIX: 12.5000 mg | ORAL_SOLUTION | Freq: Four times a day (QID) | ORAL | Status: DC | PRN

## 2015-09-13 MED ORDER — LACTATED RINGERS IV SOLN
INTRAVENOUS | Status: DC
  Administered 2015-09-13 – 2015-09-14 (×2): via INTRAVENOUS

## 2015-09-13 MED ORDER — IBUPROFEN 600 MG PO TABS
600.0000 mg | ORAL_TABLET | Freq: Four times a day (QID) | ORAL | Status: DC | PRN
  Administered 2015-09-14: 600 mg via ORAL
  Filled 2015-09-13: qty 1

## 2015-09-13 MED ORDER — INSULIN GLARGINE 100 UNIT/ML ~~LOC~~ SOLN
20.0000 [IU] | Freq: Every day | SUBCUTANEOUS | Status: DC
  Administered 2015-09-13: 20 [IU] via SUBCUTANEOUS
  Filled 2015-09-13: qty 0.2

## 2015-09-13 MED ORDER — BISACODYL 10 MG RE SUPP: 10.0000 mg | Freq: Every day | RECTAL | Status: DC | PRN

## 2015-09-13 MED ORDER — BUPIVACAINE-EPINEPHRINE (PF) 0.25% -1:200000 IJ SOLN
INTRAMUSCULAR | Status: AC
  Filled 2015-09-13: qty 30

## 2015-09-13 MED ORDER — KETOROLAC TROMETHAMINE 30 MG/ML IJ SOLN
INTRAMUSCULAR | Status: AC
  Filled 2015-09-13: qty 2

## 2015-09-13 MED ORDER — ONDANSETRON HCL 4 MG/2ML IJ SOLN: 4.0000 mg | Freq: Four times a day (QID) | INTRAMUSCULAR | Status: DC | PRN

## 2015-09-13 MED ORDER — MEPERIDINE HCL 25 MG/ML IJ SOLN: 6.2500 mg | INTRAMUSCULAR | Status: DC | PRN

## 2015-09-13 MED ORDER — LATANOPROST 0.005 % OP SOLN
1.0000 [drp] | Freq: Every day | OPHTHALMIC | Status: DC
  Administered 2015-09-13: 1 [drp] via OPHTHALMIC

## 2015-09-13 MED ORDER — FENTANYL CITRATE (PF) 100 MCG/2ML IJ SOLN
INTRAMUSCULAR | Status: AC
  Filled 2015-09-13: qty 2

## 2015-09-13 MED ORDER — ROCURONIUM BROMIDE 100 MG/10ML IV SOLN
INTRAVENOUS | Status: DC | PRN
  Administered 2015-09-13: 50 mg via INTRAVENOUS
  Administered 2015-09-13: 10 mg via INTRAVENOUS

## 2015-09-13 MED ORDER — SODIUM CHLORIDE 0.9% FLUSH: 9.0000 mL | INTRAVENOUS | Status: DC | PRN

## 2015-09-13 MED ORDER — MIDAZOLAM HCL 2 MG/2ML IJ SOLN
INTRAMUSCULAR | Status: DC | PRN
  Administered 2015-09-13: 2 mg via INTRAVENOUS

## 2015-09-13 MED ORDER — PHENYLEPHRINE 40 MCG/ML (10ML) SYRINGE FOR IV PUSH (FOR BLOOD PRESSURE SUPPORT)
PREFILLED_SYRINGE | INTRAVENOUS | Status: AC
  Filled 2015-09-13: qty 10

## 2015-09-13 MED ORDER — EPHEDRINE 5 MG/ML INJ
INTRAVENOUS | Status: AC
  Filled 2015-09-13: qty 10

## 2015-09-13 MED ORDER — FENTANYL CITRATE (PF) 250 MCG/5ML IJ SOLN
INTRAMUSCULAR | Status: AC
  Filled 2015-09-13: qty 5

## 2015-09-13 MED ORDER — ONDANSETRON HCL 4 MG/2ML IJ SOLN
INTRAMUSCULAR | Status: AC
  Filled 2015-09-13: qty 2

## 2015-09-13 MED ORDER — PHENYLEPHRINE HCL 10 MG/ML IJ SOLN
INTRAMUSCULAR | Status: DC | PRN
  Administered 2015-09-13 (×5): 40 ug via INTRAVENOUS
  Administered 2015-09-13: 80 ug via INTRAVENOUS

## 2015-09-13 MED ORDER — LIDOCAINE-EPINEPHRINE 2 %-1:100000 IJ SOLN
INTRAMUSCULAR | Status: DC | PRN
  Administered 2015-09-13: 16 mL via INTRADERMAL

## 2015-09-13 MED ORDER — ONDANSETRON HCL 4 MG/2ML IJ SOLN: 4.0000 mg | Freq: Once | INTRAMUSCULAR | Status: DC

## 2015-09-13 MED ORDER — HYDROMORPHONE HCL 1 MG/ML IJ SOLN
INTRAMUSCULAR | Status: AC
  Filled 2015-09-13: qty 1

## 2015-09-13 MED ORDER — NEOSTIGMINE METHYLSULFATE 10 MG/10ML IV SOLN
INTRAVENOUS | Status: DC | PRN
Start: 2015-09-13 — End: 2015-09-13
  Administered 2015-09-13: 4 mg via INTRAVENOUS

## 2015-09-13 MED ORDER — LIDOCAINE HCL (CARDIAC) 20 MG/ML IV SOLN
INTRAVENOUS | Status: AC
  Filled 2015-09-13: qty 5

## 2015-09-13 MED ORDER — ESTRADIOL 0.1 MG/GM VA CREA
TOPICAL_CREAM | VAGINAL | Status: AC
  Filled 2015-09-13: qty 42.5

## 2015-09-13 MED ORDER — NEOSTIGMINE METHYLSULFATE 10 MG/10ML IV SOLN
INTRAVENOUS | Status: AC
  Filled 2015-09-13: qty 1

## 2015-09-13 MED ORDER — HYDROMORPHONE HCL 1 MG/ML IJ SOLN
1.0000 mg | Freq: Once | INTRAMUSCULAR | Status: AC
  Administered 2015-09-13: 1 mg via INTRAVENOUS

## 2015-09-13 MED ORDER — OXYCODONE-ACETAMINOPHEN 5-325 MG PO TABS
1.0000 | ORAL_TABLET | ORAL | Status: DC | PRN
  Administered 2015-09-14: 1 via ORAL
  Filled 2015-09-13: qty 1

## 2015-09-13 MED ORDER — LIDOCAINE HCL (CARDIAC) 20 MG/ML IV SOLN
INTRAVENOUS | Status: DC | PRN
Start: 2015-09-13 — End: 2015-09-13
  Administered 2015-09-13: 100 mg via INTRAVENOUS

## 2015-09-13 MED ORDER — DIPHENHYDRAMINE HCL 50 MG/ML IJ SOLN
12.5000 mg | Freq: Four times a day (QID) | INTRAMUSCULAR | Status: DC | PRN
Start: 2015-09-13 — End: 2015-09-14

## 2015-09-13 MED ORDER — LISINOPRIL-HYDROCHLOROTHIAZIDE 20-25 MG PO TABS: 1.0000 | ORAL_TABLET | Freq: Every day | ORAL | Status: DC

## 2015-09-13 MED ORDER — LISINOPRIL 20 MG PO TABS
20.0000 mg | ORAL_TABLET | Freq: Every day | ORAL | Status: DC
  Filled 2015-09-13: qty 1

## 2015-09-13 MED ORDER — AMLODIPINE BESYLATE 10 MG PO TABS
10.0000 mg | ORAL_TABLET | Freq: Every day | ORAL | Status: DC
  Filled 2015-09-13: qty 1

## 2015-09-13 MED ORDER — SCOPOLAMINE 1 MG/3DAYS TD PT72: 1.0000 | MEDICATED_PATCH | Freq: Once | TRANSDERMAL | Status: DC

## 2015-09-13 MED ORDER — BUPIVACAINE HCL (PF) 0.25 % IJ SOLN
INTRAMUSCULAR | Status: AC
  Filled 2015-09-13: qty 30

## 2015-09-13 MED ORDER — MENTHOL 3 MG MT LOZG
1.0000 | LOZENGE | OROMUCOSAL | Status: DC | PRN
  Filled 2015-09-13: qty 9

## 2015-09-13 MED ORDER — LACTATED RINGERS IR SOLN
Status: DC | PRN
  Administered 2015-09-13: 3000 mL

## 2015-09-13 MED ORDER — NALOXONE HCL 0.4 MG/ML IJ SOLN
0.4000 mg | INTRAMUSCULAR | Status: DC | PRN
Start: 2015-09-13 — End: 2015-09-14

## 2015-09-13 MED ORDER — BUPIVACAINE HCL (PF) 0.25 % IJ SOLN
INTRAMUSCULAR | Status: DC | PRN
  Administered 2015-09-13: 24 mL

## 2015-09-13 MED ORDER — GLYCOPYRROLATE 0.2 MG/ML IJ SOLN
INTRAMUSCULAR | Status: DC | PRN
  Administered 2015-09-13: .6 mg via INTRAVENOUS

## 2015-09-13 SURGICAL SUPPLY — 54 items
BARRIER ADHS 3X4 INTERCEED (GAUZE/BANDAGES/DRESSINGS) IMPLANT
BENZOIN TINCTURE PRP APPL 2/3 (GAUZE/BANDAGES/DRESSINGS) ×3 IMPLANT
BNDG GAUZE ELAST 4 BULKY (GAUZE/BANDAGES/DRESSINGS) ×3 IMPLANT
CATH ROBINSON RED A/P 16FR (CATHETERS) IMPLANT
CLOTH BEACON ORANGE TIMEOUT ST (SAFETY) ×3 IMPLANT
CONT PATH 16OZ SNAP LID 3702 (MISCELLANEOUS) ×3 IMPLANT
COVER BACK TABLE 60X90IN (DRAPES) ×3 IMPLANT
DECANTER SPIKE VIAL GLASS SM (MISCELLANEOUS) ×3 IMPLANT
DRSG COVADERM PLUS 2X2 (GAUZE/BANDAGES/DRESSINGS) IMPLANT
DRSG OPSITE POSTOP 3X4 (GAUZE/BANDAGES/DRESSINGS) ×3 IMPLANT
DURAPREP 26ML APPLICATOR (WOUND CARE) ×3 IMPLANT
ELECT LIGASURE LONG (ELECTRODE) ×3 IMPLANT
ELECT LIGASURE SHORT 9 REUSE (ELECTRODE) IMPLANT
ELECT REM PT RETURN 9FT ADLT (ELECTROSURGICAL) ×3
ELECTRODE REM PT RTRN 9FT ADLT (ELECTROSURGICAL) ×2 IMPLANT
GAUZE PACKING 2X5 YD STRL (GAUZE/BANDAGES/DRESSINGS) IMPLANT
GLOVE BIO SURGEON STRL SZ 6 (GLOVE) ×3 IMPLANT
GLOVE BIO SURGEON STRL SZ7 (GLOVE) ×6 IMPLANT
GLOVE BIOGEL PI IND STRL 7.0 (GLOVE) ×10 IMPLANT
GLOVE BIOGEL PI INDICATOR 7.0 (GLOVE) ×5
GLOVE ECLIPSE 6.0 STRL STRAW (GLOVE) ×3 IMPLANT
GOWN STRL REUS W/TWL LRG LVL3 (GOWN DISPOSABLE) ×12 IMPLANT
LEGGING LITHOTOMY PAIR STRL (DRAPES) ×3 IMPLANT
LIQUID BAND (GAUZE/BANDAGES/DRESSINGS) ×3 IMPLANT
NS IRRIG 1000ML POUR BTL (IV SOLUTION) ×3 IMPLANT
PACK LAVH (CUSTOM PROCEDURE TRAY) ×3 IMPLANT
PACK ROBOTIC GOWN (GOWN DISPOSABLE) ×3 IMPLANT
PACK VAGINAL WOMENS (CUSTOM PROCEDURE TRAY) ×3 IMPLANT
PAD TRENDELENBURG POSITION (MISCELLANEOUS) ×3 IMPLANT
POUCH INSTRUMENT 3X5 (STERILIZATION PRODUCTS) ×3 IMPLANT
SET IRRIG TUBING LAPAROSCOPIC (IRRIGATION / IRRIGATOR) ×3 IMPLANT
SHEARS HARMONIC ACE PLUS 36CM (ENDOMECHANICALS) ×3 IMPLANT
SLEEVE XCEL OPT CAN 5 100 (ENDOMECHANICALS) ×6 IMPLANT
SUT CHROMIC 2 0 SH (SUTURE) IMPLANT
SUT CHROMIC 2 0 UR 5 27 (SUTURE) IMPLANT
SUT MON AB 4-0 PS1 27 (SUTURE) ×3 IMPLANT
SUT VIC AB 0 CT1 18XCR BRD8 (SUTURE) ×4 IMPLANT
SUT VIC AB 0 CT1 27 (SUTURE)
SUT VIC AB 0 CT1 27XBRD ANBCTR (SUTURE) IMPLANT
SUT VIC AB 0 CT1 36 (SUTURE) ×6 IMPLANT
SUT VIC AB 0 CT1 8-18 (SUTURE) ×2
SUT VIC AB 2-0 CT1 (SUTURE) ×3 IMPLANT
SUT VIC AB 2-0 CT1 27 (SUTURE)
SUT VIC AB 2-0 CT1 TAPERPNT 27 (SUTURE) IMPLANT
SUT VIC AB 2-0 SH 27 (SUTURE)
SUT VIC AB 2-0 SH 27XBRD (SUTURE) IMPLANT
SUT VIC AB 2-0 UR5 27 (SUTURE) IMPLANT
SUT VICRYL 0 UR6 27IN ABS (SUTURE) ×3 IMPLANT
SUT VICRYL 1 TIES 12X18 (SUTURE) ×3 IMPLANT
TOWEL OR 17X24 6PK STRL BLUE (TOWEL DISPOSABLE) ×6 IMPLANT
TRAY FOLEY CATH SILVER 14FR (SET/KITS/TRAYS/PACK) ×3 IMPLANT
TROCAR XCEL NON-BLD 5MMX100MML (ENDOMECHANICALS) ×3 IMPLANT
WARMER LAPAROSCOPE (MISCELLANEOUS) ×3 IMPLANT
WATER STERILE IRR 1000ML POUR (IV SOLUTION) ×3 IMPLANT

## 2015-09-13 NOTE — Anesthesia Procedure Notes (Signed)
Procedure Name: Intubation Date/Time: 09/13/2015 1:56 PM Performed by: Hewitt Blade Pre-anesthesia Checklist: Patient identified, Patient being monitored, Emergency Drugs available and Suction available Patient Re-evaluated:Patient Re-evaluated prior to inductionOxygen Delivery Method: Circle system utilized Preoxygenation: Pre-oxygenation with 100% oxygen Intubation Type: IV induction Ventilation: Oral airway inserted - appropriate to patient size Laryngoscope Size: Mac and 3 Grade View: Grade I Tube type: Oral Tube size: 7.0 mm Number of attempts: 1 Airway Equipment and Method: Stylet Placement Confirmation: ETT inserted through vocal cords under direct vision,  positive ETCO2 and breath sounds checked- equal and bilateral Secured at: 22 cm Tube secured with: Tape Dental Injury: Teeth and Oropharynx as per pre-operative assessment

## 2015-09-13 NOTE — Brief Op Note (Signed)
09/13/2015  4:22 PM  PATIENT:  Tina Horton  50 y.o. female  PRE-OPERATIVE DIAGNOSIS:  N92.0 Menorrhagia, fibroids, dysmenorrhea  POST-OPERATIVE DIAGNOSIS:  Same, endometrial polyp  PROCEDURE:  Procedure(s): LAPAROSCOPIC ASSISTED VAGINAL HYSTERECTOMY WITH SALPINGECTOMY, McCalls Colpoplasty (Bilateral)  SURGEON:  Surgeon(s) and Role:    * Thurnell Lose, MD - Primary    * Janyth Pupa, DO - Assisting  PHYSICIAN ASSISTANT:   ASSISTANTS: Technician   ANESTHESIA:   general  EBL:  Total I/O In: 2000 [I.V.:2000] Out: 1200 [Urine:800; Blood:400]  BLOOD ADMINISTERED:none  DRAINS: Urinary Catheter (Foley)   LOCAL MEDICATIONS USED:  MARCAINE   , LIDOCAINE  and OTHER w/epinephrine  SPECIMEN:  Source of Specimen:  Uterus/cvx, fibroid  DISPOSITION OF SPECIMEN:  PATHOLOGY  COUNTS:  YES  TOURNIQUET:  * No tourniquets in log *  DICTATION: .Other Dictation: Dictation Number [id number]  PLAN OF CARE: Admit for overnight observation  PATIENT DISPOSITION:  PACU - hemodynamically stable.   Delay start of Pharmacological VTE agent (>24hrs) due to surgical blood loss or risk of bleeding: yes

## 2015-09-13 NOTE — Discharge Instructions (Signed)

## 2015-09-13 NOTE — Interval H&P Note (Signed)
History and Physical Interval Note:  09/13/2015 1:36 PM  Tina Horton  has presented today for surgery, with the diagnosis of N92.0 Menorrhagia  The various methods of treatment have been discussed with the patient and family. After consideration of risks, benefits and other options for treatment, the patient has consented to  Procedure(s): LAPAROSCOPIC ASSISTED VAGINAL HYSTERECTOMY WITH SALPINGECTOMY (Bilateral) ANTERIOR REPAIR (CYSTOCELE) (N/A) as a surgical intervention .  The patient's history has been reviewed, patient examined, no change in status, stable for surgery.  I have reviewed the patient's chart and labs.  Questions were answered to the patient's satisfaction.     Simona Huh, Carrol Hougland

## 2015-09-13 NOTE — Anesthesia Postprocedure Evaluation (Signed)
Anesthesia Post Note  Patient: Tina Horton  Procedure(s) Performed: Procedure(s) (LRB): LAPAROSCOPIC ASSISTED VAGINAL HYSTERECTOMY WITH SALPINGECTOMY, McCalls Colpoplasty (Bilateral)  Patient location during evaluation: [facility] Anesthesia Type: General Level of consciousness: awake and alert Pain management: satisfactory to patient Vital Signs Assessment: post-procedure vital signs reviewed and stable Respiratory status: spontaneous breathing, respiratory function stable and patient connected to nasal cannula oxygen Cardiovascular status: stable Postop Assessment: adequate PO intake Anesthetic complications: no     Last Vitals:  Filed Vitals:   09/13/15 1925 09/13/15 2036  BP: 113/70 120/62  Pulse: 111 114  Temp: 36.8 C 37.2 C  Resp: 15 16    Last Pain:  Filed Vitals:   09/13/15 2039  PainSc: 4    Pain Goal: Patients Stated Pain Goal: 2 (09/13/15 1932)               Katherina Mires

## 2015-09-13 NOTE — Anesthesia Postprocedure Evaluation (Signed)
Anesthesia Post Note  Patient: Tina Horton  Procedure(s) Performed: Procedure(s) (LRB): LAPAROSCOPIC ASSISTED VAGINAL HYSTERECTOMY WITH SALPINGECTOMY, McCalls Colpoplasty (Bilateral)  Patient location during evaluation: PACU Anesthesia Type: General Level of consciousness: awake Pain management: pain level controlled Respiratory status: spontaneous breathing Cardiovascular status: stable Postop Assessment: no signs of nausea or vomiting Anesthetic complications: no     Last Vitals:  Filed Vitals:   09/13/15 1715 09/13/15 1730  BP: 127/62 122/67  Pulse: 104 102  Temp:    Resp: 19 29    Last Pain:  Filed Vitals:   09/13/15 1748  PainSc: 6    Pain Goal: Patients Stated Pain Goal: 2 (09/13/15 1730)               Masaichi Kracht JR,JOHN Mateo Flow

## 2015-09-13 NOTE — Addendum Note (Signed)
Addendum  created 09/13/15 2054 by Flossie Dibble, CRNA   Modules edited: Notes Section   Notes Section:  File: [id number]

## 2015-09-13 NOTE — Transfer of Care (Signed)
Immediate Anesthesia Transfer of Care Note  Patient: Tina Horton  Procedure(s) Performed: Procedure(s): LAPAROSCOPIC ASSISTED VAGINAL HYSTERECTOMY WITH SALPINGECTOMY, McCalls Colpoplasty (Bilateral)  Patient Location: PACU  Anesthesia Type:General  Level of Consciousness: awake, alert  and oriented  Airway & Oxygen Therapy: Patient Spontanous Breathing and Patient connected to nasal cannula oxygen  Post-op Assessment: Report given to RN, Post -op Vital signs reviewed and stable and Patient moving all extremities  Post vital signs: Reviewed and stable  Last Vitals:  Filed Vitals:   09/13/15 1131  BP: 155/85  Pulse: 110  Temp: 36.8 C  Resp: 18    Last Pain:  Filed Vitals:   09/13/15 1131  PainSc: 1       Patients Stated Pain Goal: 2 ([id number])  Complications: No apparent anesthesia complications

## 2015-09-14 ENCOUNTER — Encounter (HOSPITAL_COMMUNITY): Payer: Self-pay | Admitting: Obstetrics and Gynecology

## 2015-09-14 DIAGNOSIS — N920 Excessive and frequent menstruation with regular cycle: Secondary | ICD-10-CM | POA: Diagnosis not present

## 2015-09-14 LAB — CBC
HCT: 26.5 % — ABNORMAL LOW (ref 36.0–46.0)
Hemoglobin: 8.8 g/dL — ABNORMAL LOW (ref 12.0–15.0)
MCH: 27.1 pg (ref 26.0–34.0)
MCHC: 33.2 g/dL (ref 30.0–36.0)
MCV: 81.5 fL (ref 78.0–100.0)
Platelets: 235 10*3/uL (ref 150–400)
RBC: 3.25 MIL/uL — ABNORMAL LOW (ref 3.87–5.11)
RDW: 16.1 % — ABNORMAL HIGH (ref 11.5–15.5)
WBC: 10.9 10*3/uL — ABNORMAL HIGH (ref 4.0–10.5)

## 2015-09-14 LAB — BASIC METABOLIC PANEL
Anion gap: 8 (ref 5–15)
BUN: 9 mg/dL (ref 6–20)
CO2: 29 mmol/L (ref 22–32)
Calcium: 9.1 mg/dL (ref 8.9–10.3)
Chloride: 101 mmol/L (ref 101–111)
Creatinine, Ser: 0.83 mg/dL (ref 0.44–1.00)
GFR calc Af Amer: >60 mL/min (ref >60)
GFR calc non Af Amer: >60 mL/min (ref >60)
Glucose, Bld: 121 mg/dL — ABNORMAL HIGH (ref 65–99)
Potassium: 3 mmol/L — ABNORMAL LOW (ref 3.5–5.1)
Sodium: 138 mmol/L (ref 135–145)

## 2015-09-14 LAB — GLUCOSE, CAPILLARY: Glucose-Capillary: 213 mg/dL — ABNORMAL HIGH (ref 65–99)

## 2015-09-14 MED ORDER — OXYCODONE-ACETAMINOPHEN 5-325 MG PO TABS: 1.0000 | ORAL_TABLET | ORAL | Status: DC | PRN

## 2015-09-14 MED ORDER — IBUPROFEN 600 MG PO TABS: 600.0000 mg | ORAL_TABLET | Freq: Four times a day (QID) | ORAL | Status: DC | PRN

## 2015-09-14 NOTE — Progress Notes (Signed)
Patient discharged home with significant other... Discharge instructions reviewed with patient and she verbalized understanding... Condition stable... No equipment... Taken to car via wheelchair by C. Ovid Curd, [place].

## 2015-09-14 NOTE — Progress Notes (Signed)
Vaginal packing removed as ordered. Minimal amount of drainage noted. Patient tolerated well.

## 2015-09-14 NOTE — Op Note (Signed)
Tina Horton, Tina Horton                ACCOUNT NO.:  [id number]  MEDICAL RECORD NO.:  [id number]  LOCATION:  9309                          FACILITY:  [facility]  PHYSICIAN:  Jola Schmidt, MD   DATE OF BIRTH:  03/20/1966  DATE OF PROCEDURE: DATE OF DISCHARGE:                              OPERATIVE REPORT   PREOPERATIVE DIAGNOSES:  Menorrhagia, fibroids and dysmenorrhea.  POSTOPERATIVE DIAGNOSES:  Menorrhagia, fibroids and dysmenorrhea and endometrial polyp.  PROCEDURES:  Laparoscopic-assisted vaginal hysterectomy with salpingectomy and McCall's culdoplasty, bilateral.  SURGEON:  Jola Schmidt, MD  ASSISTANT:  Annalee Genta, D.O. and technician.  ANESTHESIA:  General.  EBL:  400.  URINE:  800 mL.  BLOOD ADMINISTERED:  None.  DRAINS:  Foley catheter.  LOCAL:  Marcaine and lidocaine with epi.  SPECIMEN:  Uterus with cervix and fibroid.  DISPOSITION OF SPECIMEN:  To Pathology.  DISPOSITION:  To PACU, hemodynamically stable.  COMPLICATIONS:  None.  FINDINGS:  Retroverted uterus with large posterior fibroid.  Normal ovaries and fallopian tubes bilaterally.  No extensive adhesions.  A 2-3 cm endometrial mass, which felt like a polyp.  PROCEDURE IN DETAIL:  [name] was identified in the holding area. She was then taken to the operating room with IV running.  She underwent general endotracheal anesthesia without complication.  She was then placed in the dorsal lithotomy position, prepped and draped in a normal sterile fashion.  Ancef 2 g IV was given.  A time-out was performed.  The patient would have three 5-mm trocars.  Incisions were injected with Marcaine prior to incision.  She had a periumbilical incision that was made with the 11-blade scalpel.  Intraabdominal access was performed under direct visualization with the laparoscope.  Once the abdomen was entered, CO2 gas was used to insufflate the abdomen.  We then placed two other ports under direct visualization  and transillumination, and again with Marcaine.  Once all ports were in, the uterus was manipulated and the fallopian tube on the right side was grasped and transected with the Harmonic scalpel.  The round ligament was also transected and the broad ligament, periuterine tissue was transected.  The ovaries were little close to the uterus, so there was some bleeding that was made hemostatic with the Kleppinger.  The same exact process was done on the left hand side. Bladder flap was developed.  The vesicouterine peritoneum was incised and separated.  Attention was then turned to the pelvis.  A weighted speculum was inserted with the Deaver.  Cervix was visualized and the paracervical tissue was injected with lidocaine with epi until blanching was noted. The Bovie was then used to incise the tissue circumferentially and the cervix was grasped with two long tenaculums for manipulation.  The anterior and posterior cul-de-sac were entered with the Mayo scissors.  The posterior was first.  Peritoneal access was identify when there was bleeding noted, that was suctioned out and a long weighted speculum was placed.  The anterior cul-de-sac was also entered, which was little more difficult because of distortion with the fibroid.  Once that was performed, the uterosacrals were grasped with the Kocher clamps and with the Haney clamps, and  tagged and clamped and suture ligated with 0 Vicryl.  Same was done on the opposite side.  Then, we used a LigaSure handpiece to separate the broad ligament and other ligament to the cornu of the uterus.  The uterus was then separated, but the left fibroid seemed to have the uterus dropped into the pelvis.  It was difficult to come to the opening.  So, the cervix was bivalved and the lower quadrant of the right side of the uterus was removed and then we were able to pull the uterus out through the vagina.  The angle stitches were used to anchor the cuff with 0  pop-off.  Then, a McCall's culdoplasty was performed, which would be sensed at the end of the case. The cuff was closed with 0 Vicryl in a continuous locked fashion.  The uterosacral ligaments were plicated, made closure and the McCall's culdoplasty was tied down at the end.  My thought I had wanted to do an anterior repair, but there was not a lot of bladder that was left and once the uterus was removed and McCall's culdoplasty was noted, she had adequate support.  Once all instruments were removed from the vagina, attention was turned to the abdomen.  One last look was obtained.  Irrigation was performed. The pedicles and cuff were hemostatic.  All trocars were removed under direct visualization.  The CO2 gas was expelled.  Incisions were reapproximated with 4-0 Monocryl and LiquiBand dressing should be applied.  The patient tolerated the procedure well.  All instrument, sponge, and needle counts were correct x3.  She was taken to the recovery room in stable condition.     Jola Schmidt, MD     EBV/MEDQ  D:  09/13/2015  T:  09/14/2015  Job:  [id number]

## 2015-09-22 ENCOUNTER — Encounter: Payer: Self-pay | Admitting: Medical

## 2015-09-22 ENCOUNTER — Ambulatory Visit (INDEPENDENT_AMBULATORY_CARE_PROVIDER_SITE_OTHER): Payer: BLUE CROSS/BLUE SHIELD | Admitting: Medical

## 2015-09-22 VITALS — BP 150/84 | HR 102 | Temp 98.2°F | Wt 144.0 lb

## 2015-09-22 DIAGNOSIS — I10 Essential (primary) hypertension: Secondary | ICD-10-CM | POA: Diagnosis not present

## 2015-09-22 DIAGNOSIS — Z90710 Acquired absence of both cervix and uterus: Secondary | ICD-10-CM | POA: Diagnosis not present

## 2015-09-22 DIAGNOSIS — E118 Type 2 diabetes mellitus with unspecified complications: Secondary | ICD-10-CM | POA: Diagnosis not present

## 2015-09-22 NOTE — Patient Instructions (Signed)
Recommendations:  Increase Lantus to 26 units at bedtime  Begin using meal time Humalog Kwikpen insulin (fast acting insulin) per the sliding scale below  Check sugars before meals  Recheck in 2 weeks    Correction Insulin/Sliding Scale Your caregiver has decided you need insulin at home. You have been given a correctional scale (sliding scale) in case you need extra insulin when your blood sugar is too high (hyperglycemia). The following instructions will assist you in how to use that correctional scale.  WHAT IS A CORRECTIONAL SCALE (SLIDING SCALE)?  When you check your blood sugar, sometimes it will be higher than your caregiver wants it to be. You may need an extra dose of insulin to bring your blood sugar to your desired level (also known as your goal, target level, or normal level.) The correctional scale is prescribed by your caregiver based on your specific needs.   ______________________________________________________________________  INSULIN SLIDING SCALE   Use the chart below to determine the amount of your Humalog Kwikpen meal time Insulin that you will use to control your meal time blood sugar.  If your glucose before meal is less than 60, drink 4 oz of orange juice or if able, eat a piece of candy and do not use the meal time dose of insulin  If your glucose before meal is 60 -100, don't use the meal time insulin for this meal If your glucose before meal is 101-150, use  2  units of Insulin  If your glucose before meal is 151-200, use  4  units of Insulin  If your glucose before meal is 201-250, use  6  units of Insulin If your glucose before meal is 251-300, use  8  units of Insulin If your glucose before meal is 301-350, use  10  units of Insulin If your glucose before meal is 351-400, use  12  units of Insulin If your glucose before meal is 451-500, use  14  units of Insulin If your glucose before meal is >500, use 16 units of Insulin and call doctor  immediately  ________________________________________________________________________    WHY IS IT IMPORTANT TO KEEP YOUR BLOOD SUGAR LEVELS AT YOUR DESIRED LEVEL?  It helps to prevent long-term complications of diabetes, such as eye disease, kidney failure, and other serious complications. WHAT TYPE OF INSULIN WILL YOU USE?  To help bring down blood sugars that are too high, your caregiver has prescribed a short-acting or a rapid-acting insulin. An example of a short-acting insulin would be Regular.  WHAT DO I NEED TO DO?   Check your blood sugar with your home blood glucose meter as recommended by your caregiver.  Using your correctional scale, find the range your blood sugar lies in.  Look for the units of insulin that matches the blood sugar range. Give yourself the dose of correctional insulin your caregiver has prescribed. Always make sure you are using the right type of insulin.  Prior to the injection make sure you have food available that you can eat in the next 15 to 30 minutes.  If your correctional insulin is rapid acting, start eating your meal within 15 minutes after you have given yourself the insulin injection. If you wait longer than 15 minutes to eat, your blood sugar might get too low.  If your correctional insulin is short acting (Regular), start eating your meal within 30 minutes after you have given yourself the insulin injection. If you wait longer than 30 minutes to eat, your blood  sugar might get too low. Symptoms of low blood sugar (hypoglycemia) may include feeling shaky or weak, sweating a lot, not thinking straight, difficulty seeing, agitation, or crankiness. Check your blood sugar immediately and treat your results as directed by your caregiver.  Keep a log of your blood sugar results with the time you took the test and the amount of insulin that you injected. This information will help your caregiver manage your medications.  Note on your log anything  that may affect your blood sugars such as:  Changes in normal exercise or activity.  Changes in your normal schedule, such as staying up late, going on vacation, changing your diet, or holidays.  New medications. This includes all medications. Some medications, even those that do not require a prescription, may cause high blood sugars.  Illness or stress.  Changes in when you actually took your medication.  Changes in your meals, such as skipping a meal, a late meal, or dining out.  Eating things that may affect blood glucose, such as snacks, larger meal portions than normal, or drinks with sugar.  Ask your caregiver any questions you have.

## 2015-09-22 NOTE — Progress Notes (Signed)
Subjective: Chief Complaint  Patient presents with  . blood sugars running high    said it has been between 202-205. using 22 units.    Here for recheck on diabetes.   She just had surgery 09/13/15 for hysterectomy.  Here due to sugars running high.   Can't get sugars to go below 200.  She increased insulin to 22 units at bedtime.   She was getting insulin sliding scale during the hospital stay.    BPs have been running good except today's reading.  She has no other c/o.  No low readings.   No other aggravating or relieving factors. No other complaint.  Past Medical History  Diagnosis Date  . Hypertension   . Myopia   . Glaucoma     Dr. Einar Gip  . GERD (gastroesophageal reflux disease)   . H/O mammogram 2005  . Routine gynecological examination     last pap 2005  . Hyperlipidemia   . Diabetes mellitus without complication [facility])     age 50yo  . Legally blind   . Heart murmur   . Intermittent palpitations   . Shortness of breath dyspnea   . Thyroid nodule   . Headache   . Sinusitis   . Pneumonia   . Bronchitis   . Seasonal allergies   . Arthritis     knees  . Anemia    ROS as in subjective   Objective: BP 150/84 mmHg  Pulse 102  Wt 144 lb (65.318 kg)  LMP 08/15/2015 (Exact Date)  Gen: wd, wn, nad [place], answers questions appropriate No edema CN2-12 intact Heart: RRR, normal S1, S2, no murmurs   Assessment: Encounter Diagnoses  Name Primary?  . Diabetes mellitus with complication in adult patient ([place]) Yes  . Essential hypertension   . S/P laparoscopic assisted vaginal hysterectomy (LAVH)     Plan: Labs today  Advised that increased readings can be due to stress on the body from recent surgery but she has also added back grains to her recent fruit and veggie only diet.   Increase Lantus to 26 u QHS, add sliding scale Humalog for the time begin.  Gave sample pen, demonstrated proper use of medication, discussed use of sliding scale, monitoring glucose,  avoiding low readings.  She voices understanding of plan.  Recheck in 2 wk.

## 2015-09-23 LAB — INSULIN, RANDOM: Insulin: 31 u[IU]/mL — ABNORMAL HIGH (ref 2.0–19.6)

## 2015-09-23 LAB — C-PEPTIDE: C-Peptide: 2.61 ng/mL (ref 0.80–3.85)

## 2015-09-25 ENCOUNTER — Inpatient Hospital Stay (HOSPITAL_COMMUNITY)
Admission: AD | Admit: 2015-09-25 | Discharge: 2015-09-26 | Disposition: A | Discharge status: Home / Self Care | Payer: BLUE CROSS/BLUE SHIELD | Source: Ambulatory Visit | Attending: Obstetrics & Gynecology | Admitting: Obstetrics & Gynecology

## 2015-09-25 ENCOUNTER — Other Ambulatory Visit: Payer: Self-pay | Admitting: Medical

## 2015-09-25 DIAGNOSIS — K219 Gastro-esophageal reflux disease without esophagitis: Secondary | ICD-10-CM | POA: Insufficient documentation

## 2015-09-25 DIAGNOSIS — Z9889 Other specified postprocedural states: Secondary | ICD-10-CM | POA: Diagnosis not present

## 2015-09-25 DIAGNOSIS — M170 Bilateral primary osteoarthritis of knee: Secondary | ICD-10-CM | POA: Diagnosis not present

## 2015-09-25 DIAGNOSIS — I10 Essential (primary) hypertension: Secondary | ICD-10-CM | POA: Diagnosis not present

## 2015-09-25 DIAGNOSIS — R011 Cardiac murmur, unspecified: Secondary | ICD-10-CM | POA: Diagnosis not present

## 2015-09-25 DIAGNOSIS — E119 Type 2 diabetes mellitus without complications: Secondary | ICD-10-CM | POA: Insufficient documentation

## 2015-09-25 DIAGNOSIS — N898 Other specified noninflammatory disorders of vagina: Secondary | ICD-10-CM | POA: Diagnosis not present

## 2015-09-25 DIAGNOSIS — H409 Unspecified glaucoma: Secondary | ICD-10-CM | POA: Diagnosis not present

## 2015-09-25 DIAGNOSIS — E785 Hyperlipidemia, unspecified: Secondary | ICD-10-CM | POA: Insufficient documentation

## 2015-09-25 DIAGNOSIS — N939 Abnormal uterine and vaginal bleeding, unspecified: Secondary | ICD-10-CM | POA: Insufficient documentation

## 2015-09-25 MED ORDER — INSULIN LISPRO 100 UNIT/ML (KWIKPEN): 5.0000 [IU] | PEN_INJECTOR | Freq: Three times a day (TID) | SUBCUTANEOUS | Status: DC

## 2015-09-26 ENCOUNTER — Telehealth: Payer: Self-pay

## 2015-09-26 ENCOUNTER — Encounter (HOSPITAL_COMMUNITY): Payer: Self-pay

## 2015-09-26 DIAGNOSIS — N898 Other specified noninflammatory disorders of vagina: Secondary | ICD-10-CM | POA: Diagnosis not present

## 2015-09-26 LAB — URINE MICROSCOPIC-ADD ON

## 2015-09-26 LAB — CBC
HCT: 29.1 % — ABNORMAL LOW (ref 36.0–46.0)
Hemoglobin: 9.5 g/dL — ABNORMAL LOW (ref 12.0–15.0)
MCH: 27.5 pg (ref 26.0–34.0)
MCHC: 32.6 g/dL (ref 30.0–36.0)
MCV: 84.1 fL (ref 78.0–100.0)
Platelets: 444 10*3/uL — ABNORMAL HIGH (ref 150–400)
RBC: 3.46 MIL/uL — ABNORMAL LOW (ref 3.87–5.11)
RDW: 17.1 % — ABNORMAL HIGH (ref 11.5–15.5)
WBC: 10 10*3/uL (ref 4.0–10.5)

## 2015-09-26 LAB — URINALYSIS, ROUTINE W REFLEX MICROSCOPIC
Bilirubin Urine: NEGATIVE
Glucose, UA: >1000 mg/dL — AB
Ketones, ur: NEGATIVE mg/dL
Leukocytes, UA: NEGATIVE
Nitrite: NEGATIVE
Protein, ur: NEGATIVE mg/dL
Specific Gravity, Urine: 1.01 (ref 1.005–1.030)
pH: 6 (ref 5.0–8.0)

## 2015-09-26 NOTE — MAU Provider Note (Signed)
History     CSN: [id number]  Arrival date and time: 09/25/15 2357   First Provider Initiated Contact with Patient 09/26/15 0030      Chief Complaint  Patient presents with  . Tina Bleeding   HPI Comments: VERTIS Horton is a 50 y.o. S/P LAVH on 09/13/15. Presents tonight with Tina spotting.   Tina Bleeding The patient's primary symptoms include Tina bleeding. This is a new problem. The current episode started today. The problem occurs intermittently. The problem has been unchanged. The patient is experiencing no pain. She is not pregnant. Pertinent negatives include no abdominal pain, chills, constipation, diarrhea, dysuria, fever, frequency, nausea, urgency or vomiting. The Tina bleeding is spotting (red, spotting with wiping. She states that by the time she got here is seemed to be getting better. ). She has not been passing clots. She has not been passing tissue. The symptoms are aggravated by activity. She has tried nothing for the symptoms. Sexual activity: not sexually active since surgery.  She uses hysterectomy (hysterectomy on 09/13/15 ) for contraception. Her past medical history is significant for a gynecological surgery.     Past Medical History  Diagnosis Date  . Hypertension   . Myopia   . Glaucoma     Dr. Einar Gip  . GERD (gastroesophageal reflux disease)   . H/O mammogram 2005  . Routine gynecological examination     last pap 2005  . Hyperlipidemia   . Diabetes mellitus without complication [facility])     age 13yo  . Legally blind   . Heart murmur   . Intermittent palpitations   . Shortness of breath dyspnea   . Thyroid nodule   . Headache   . Sinusitis   . Pneumonia   . Bronchitis   . Seasonal allergies   . Arthritis     knees  . Anemia     Past Surgical History  Procedure Laterality Date  . Lasik      x2  . Incision and drainage      abdominal superficial abscess  . Cystectomy      umbilicus  . Laparoscopic Tina hysterectomy with  salpingectomy Bilateral 09/13/2015    Procedure: LAPAROSCOPIC ASSISTED Tina HYSTERECTOMY WITH SALPINGECTOMY, McCalls Colpoplasty;  Surgeon: Thurnell Lose, MD;  Location: [place] [facility];  Service: Gynecology;  Laterality: Bilateral;    Family History  Problem Relation Age of Onset  . Diabetes Mother   . Hypertension Mother   . Hypertension Father   . Hypertension Sister   . Hypertension Brother   . Stroke Maternal Grandmother   . Cancer Neg Hx   . Heart disease Neg Hx     Social History  Substance Use Topics  . Smoking status: Never Smoker   . Smokeless tobacco: Never Used  . Alcohol Use: No    Allergies:  Allergies  Allergen Reactions  . Metformin And Related     Vomiting, diarrhea, nausea  . Tanzeum [Albiglutide] Nausea And Vomiting    nausea    Prescriptions prior to admission  Medication Sig Dispense Refill Last Dose  . amLODipine (NORVASC) 10 MG tablet Take 1 tablet (10 mg total) by mouth daily. 90 tablet 3 09/25/2015 at Unknown time  . Fe Fum-FePoly-Vit C-Vit B3 (INTEGRA) 62.5-62.5-40-3 MG CAPS Take 1 capsule by mouth daily.   Past Month at Unknown time  . ibuprofen (ADVIL,MOTRIN) 600 MG tablet Take 1 tablet (600 mg total) by mouth every 6 (six) hours as needed (mild pain). 30 tablet 0 09/25/2015 at  Unknown time  . Insulin Glargine (LANTUS) 100 UNIT/ML Solostar Pen Inject 20 Units into the skin daily at 10 pm. 15 mL 5 09/25/2015 at Unknown time  . insulin lispro (HUMALOG) 100 UNIT/ML KiwkPen Inject 0.05 mLs (5 Units total) into the skin 3 (three) times daily. 15 mL 2 09/25/2015 at Unknown time  . latanoprost (XALATAN) 0.005 % ophthalmic solution Place 1 drop into both eyes at bedtime.   09/25/2015 at Unknown time  . lisinopril-hydrochlorothiazide (PRINZIDE,ZESTORETIC) 20-25 MG tablet 1 tablet po daily 90 tablet 1 09/25/2015 at Unknown time  . Multiple Vitamin (MULTIVITAMIN) tablet Take 1 tablet by mouth daily. Reported on 09/22/2015   Past Month at Unknown time  . potassium chloride SA  (KLOR-CON M20) 20 MEQ tablet Take 1 tablet (20 mEq total) by mouth 2 (two) times daily. 180 tablet 1 09/25/2015 at Unknown time  . pravastatin (PRAVACHOL) 20 MG tablet Take 1 tablet (20 mg total) by mouth daily. 90 tablet 1 09/25/2015 at Unknown time  . aspirin EC 81 MG tablet Take 1 tablet (81 mg total) by mouth daily. (Patient not taking: Reported on 09/22/2015) 90 tablet 3 Not Taking  . Insulin Pen Needle 32G X 6 MM MISC Use pen needles for lantus solorstar pen 100 each 2 Taking    Review of Systems  Constitutional: Negative for fever and chills.  Gastrointestinal: Negative for nausea, vomiting, abdominal pain, diarrhea and constipation.  Genitourinary: Positive for Tina bleeding. Negative for dysuria, urgency and frequency.   Physical Exam   Blood pressure 152/72, pulse 101, temperature 98 F (36.7 C), temperature source Oral, resp. rate 16, height 5\' 1"  (1.549 m), weight 65.772 kg (145 lb), last menstrual period 08/15/2015, SpO2 98 %.  Physical Exam  Nursing note and vitals reviewed. Constitutional: She is oriented to person, place, and time. She appears well-developed and well-nourished. No distress.  HENT:  Head: Normocephalic.  Cardiovascular: Normal rate.   Respiratory: Effort normal.  GI: Soft. There is no tenderness. There is no rebound.  Genitourinary:   Bimanual exam: cuff intact, suture felt. No blood on glove.   Neurological: She is alert and oriented to person, place, and time.  Skin: Skin is warm and dry.  Psychiatric: She has a normal mood and affect.   Results for orders placed or performed during the hospital encounter of 09/25/15 (from the past 24 hour(s))  Urinalysis, Routine w reflex microscopic (not at [facility])     Status: Abnormal   Collection Time: 09/25/15 11:59 PM  Result Value Ref Range   Color, Urine YELLOW YELLOW   APPearance CLEAR CLEAR   Specific Gravity, Urine 1.010 1.005 - 1.030   pH 6.0 5.0 - 8.0   Glucose, UA >1000 (A) NEGATIVE mg/dL   Hgb urine  dipstick TRACE (A) NEGATIVE   Bilirubin Urine NEGATIVE NEGATIVE   Ketones, ur NEGATIVE NEGATIVE mg/dL   Protein, ur NEGATIVE NEGATIVE mg/dL   Nitrite NEGATIVE NEGATIVE   Leukocytes, UA NEGATIVE NEGATIVE  Urine microscopic-add on     Status: Abnormal   Collection Time: 09/25/15 11:59 PM  Result Value Ref Range   Squamous Epithelial / LPF 0-5 (A) NONE SEEN   WBC, UA 0-5 0 - 5 WBC/hpf   RBC / HPF 0-5 0 - 5 RBC/hpf   Bacteria, UA RARE (A) NONE SEEN  CBC     Status: Abnormal   Collection Time: 09/26/15 12:42 AM  Result Value Ref Range   WBC 10.0 4.0 - 10.5 K/uL   RBC 3.46 (L)  3.87 - 5.11 MIL/uL   Hemoglobin 9.5 (L) 12.0 - 15.0 g/dL   HCT 29.1 (L) 36.0 - 46.0 %   MCV 84.1 78.0 - 100.0 fL   MCH 27.5 26.0 - 34.0 pg   MCHC 32.6 30.0 - 36.0 g/dL   RDW 17.1 (H) 11.5 - 15.5 %   Platelets 444 (H) 150 - 400 K/uL     MAU Course  Procedures  MDM 0139: D/W Dr. Cletis Media, ok for DC home. FU with [facility] as planned.   Assessment and Plan   1. Post-operative state   2. Tina spotting    DC home Comfort measures reviewed  Bleeding precautions RX: none  Return to MAU as needed   Follow-up Information    Follow up with Thurnell Lose, MD.   Specialty:  Obstetrics and Gynecology   Why:  As scheduled   Contact information:   [address] [place] [address] [phone]         Mathis Bud 09/26/2015, 12:31 AM

## 2015-09-26 NOTE — Discharge Instructions (Signed)
Laparoscopically Assisted Vaginal Hysterectomy A laparoscopically assisted vaginal hysterectomy (LAVH) is a surgical procedure to remove the uterus and cervix, and sometimes the ovaries and fallopian tubes. During an LAVH, some of the surgical removal is done through the vagina, and the rest is done through a few small surgical cuts (incisions) in the abdomen.  This procedure is usually considered in women when a vaginal hysterectomy is not an option. Your health care provider will discuss the risks and benefits of the different surgical techniques at your appointment. Generally, recovery time is faster and there are fewer complications after laparoscopic procedures than after open incisional procedures. LET YOUR HEALTH CARE PROVIDER KNOW ABOUT:   Any allergies you have.  All medicines you are taking, including vitamins, herbs, eye drops, creams, and over-the-counter medicines.  Previous problems you or members of your family have had with the use of anesthetics.  Any blood disorders you have.  Previous surgeries you have had.  Medical conditions you have. RISKS AND COMPLICATIONS Generally, this is a safe procedure. However, as with any procedure, complications can occur. Possible complications include:  Allergies to medicines.  Difficulty breathing.  Bleeding.  Infection.  Damage to other structures near your uterus and cervix. BEFORE THE PROCEDURE  Ask your health care provider about changing or stopping your regular medicines.  Take certain medicines, such as a colon-emptying preparation, as directed.  Do not eat or drink anything for at least 8 hours before your surgery.  Stop smoking if you smoke. Stopping will improve your health after surgery.  Arrange for a ride home after surgery and for help at home during recovery. PROCEDURE   An IV tube will be put into one of your veins in order to give you fluids and medicines.  You will receive medicines to relax you and  medicines that make you sleep (general anesthetic).  You may have a flexible tube (catheter) put into your bladder to drain urine.  You may have a tube put through your nose or mouth that goes into your stomach (nasogastric tube). The nasogastric tube removes digestive fluids and prevents you from feeling nauseated and from vomiting.  Tight-fitting (compression) stockings will be placed on your legs to promote circulation.  Three to four small incisions will be made in your abdomen. An incision also will be made in your vagina. Probes and tools will be inserted into the small incisions. The uterus and cervix are removed (and possibly your ovaries and fallopian tubes) through your vagina as well as through the small incisions that were made in the abdomen.  Your vagina is then sewn back to normal. AFTER THE PROCEDURE  You may have a liquid diet temporarily. You will most likely return to, and tolerate, your usual diet the day after surgery.  You will be passing urine through a catheter. It will be removed the day after surgery.  Your temperature, breathing rate, heart rate, blood pressure, and oxygen level will be monitored regularly.  You will still wear compression stockings on your legs until you are able to move around.  You will use a special device or do breathing exercises to keep your lungs clear.  You will be encouraged to walk as soon as possible.   This information is not intended to replace advice given to you by your health care provider. Make sure you discuss any questions you have with your health care provider.   Document Released: 04/25/2011 Document Revised: 05/27/2014 Document Reviewed: 11/19/2012 Elsevier Interactive Patient Education 2016 Elsevier   Inc.  

## 2015-09-26 NOTE — Telephone Encounter (Signed)
Patietn call reports bleeding with urination and notes "a smear" noted with wiping.  Patient also reports pain that is is sharp and shooting.  Patient does admit that bleeding was self limiting, but when returning to bathroom (while on phone with provider) more bright red blood noted.  Patient denies issues with urination and reports a bowel movement around 10am that was not hard to pass, but did require some straining.  Patient denies issues with urination and reports that she did increase activity slightly today.  Precautions discussed.  Patient instructed to monitor and report to MAU if bleeding continued.  Patient otherwise follow up in office as scheduled.  JE, CNM

## 2015-09-26 NOTE — MAU Note (Signed)
Pt states she is s/p vaginal hysterectomy on 04/26 and started having vaginal bleeding tonight. Has had pain off/on for the last few days.

## 2015-09-27 ENCOUNTER — Telehealth: Payer: Self-pay | Admitting: Medical

## 2015-09-27 NOTE — Telephone Encounter (Signed)
Recv'd fax stating P.A. Required for Humalog or change to preferred Novolog.  Do you want to switch?

## 2015-09-28 ENCOUNTER — Other Ambulatory Visit: Payer: Self-pay | Admitting: Medical

## 2015-09-28 MED ORDER — INSULIN PEN NEEDLE 32G X 6 MM MISC: Status: DC

## 2015-09-28 MED ORDER — INSULIN ASPART 100 UNIT/ML FLEXPEN: 8.0000 [IU] | PEN_INJECTOR | Freq: Three times a day (TID) | SUBCUTANEOUS | Status: DC

## 2015-09-28 NOTE — Addendum Note (Signed)
Addended by: Billie Lade on: 09/28/2015 12:28 PM   Modules accepted: Orders

## 2015-09-28 NOTE — Telephone Encounter (Signed)
Before we do this, call and see how her sugars are running, and how she is doing with the sample Humalog.

## 2015-09-28 NOTE — Telephone Encounter (Signed)
States they are still staying around 200 and when she uses the sliding scale her sugar goes down but once it "wears off" it goes right back up

## 2015-09-28 NOTE — Telephone Encounter (Signed)
Increase the Lantus night time long acting insulin to 30 units, but continue the sliding scale as in the chart.   I sent Novolog to pharmacy to use when the Humalog sample runs out.  Novolog is a different brand preferred by her insurance for fasting acting/meal time use.

## 2015-09-28 NOTE — Telephone Encounter (Signed)
Pt is aware and sent in pen needles for her Novolog

## 2015-10-05 ENCOUNTER — Telehealth: Payer: Self-pay | Admitting: Medical

## 2015-10-05 ENCOUNTER — Ambulatory Visit: Payer: BLUE CROSS/BLUE SHIELD | Admitting: Medical

## 2015-10-05 NOTE — Telephone Encounter (Signed)
PT called to cancel her appt she was in to much pain, she was suppose to come in for a Follow up appt for Diabetes.she states that her sugars have been down as long as she eats right. She is going to wait till her July appt come in for her diabetes check. I told her if she needed to come in to please call us to set something up. She is going to call her GYN dr to talk with them about her lower stomach pain since she just had a hysterometry. I informed Audelia Acton of this to make sure she didn't need to come in for this appt and he said it was ok. That she did need to follow up with the GYN dr.

## 2015-10-24 ENCOUNTER — Encounter: Payer: Self-pay | Admitting: Family Medicine

## 2015-10-24 ENCOUNTER — Ambulatory Visit (INDEPENDENT_AMBULATORY_CARE_PROVIDER_SITE_OTHER): Payer: BLUE CROSS/BLUE SHIELD | Admitting: Family Medicine

## 2015-10-24 VITALS — BP 126/70 | HR 104 | Wt 147.0 lb

## 2015-10-24 DIAGNOSIS — H5712 Ocular pain, left eye: Secondary | ICD-10-CM | POA: Diagnosis not present

## 2015-10-24 NOTE — Progress Notes (Signed)
   Subjective:    Patient ID: Tina Horton, female    DOB: 12/08/1965, 50 y.o.   MRN: [id number]  HPI While laying in bed with her eyes closed she noted some pain in her left eye associated with tearing and slight headache. She noted no visual changes at that time and states that headache is almost entirely gone. She does feel a slight tingling sensation in the eye. No discharge noted. No sore throat or earache. No sweating on the forehead.   Review of Systems     Objective:   Physical Exam Alert and in no distress. Conjunctiva appeared normal. Visual inspection of the eyes showed no foreign body. The eye was stained with fluorescein and again nothing was identified.       Assessment & Plan:  Eye pain, left Assured her that I did not find any foreign body or any other evidence of major issues. She has an appointment to see her eye doctor tomorrow. Recommend trying Advil or Tylenol for pain relief if she needed it.

## 2015-11-10 LAB — HM DIABETES EYE EXAM

## 2015-11-14 ENCOUNTER — Encounter: Payer: Self-pay | Admitting: Medical

## 2015-12-04 ENCOUNTER — Ambulatory Visit (INDEPENDENT_AMBULATORY_CARE_PROVIDER_SITE_OTHER): Payer: BLUE CROSS/BLUE SHIELD | Admitting: Medical

## 2015-12-04 ENCOUNTER — Encounter: Payer: Self-pay | Admitting: Medical

## 2015-12-04 VITALS — BP 130/78 | HR 98 | Wt 148.0 lb

## 2015-12-04 DIAGNOSIS — Z1211 Encounter for screening for malignant neoplasm of colon: Secondary | ICD-10-CM

## 2015-12-04 DIAGNOSIS — M545 Low back pain, unspecified: Secondary | ICD-10-CM | POA: Insufficient documentation

## 2015-12-04 DIAGNOSIS — E7800 Pure hypercholesterolemia, unspecified: Secondary | ICD-10-CM

## 2015-12-04 DIAGNOSIS — E118 Type 2 diabetes mellitus with unspecified complications: Secondary | ICD-10-CM | POA: Diagnosis not present

## 2015-12-04 DIAGNOSIS — M5441 Lumbago with sciatica, right side: Secondary | ICD-10-CM | POA: Diagnosis not present

## 2015-12-04 DIAGNOSIS — H409 Unspecified glaucoma: Secondary | ICD-10-CM | POA: Diagnosis not present

## 2015-12-04 DIAGNOSIS — D509 Iron deficiency anemia, unspecified: Secondary | ICD-10-CM

## 2015-12-04 DIAGNOSIS — I10 Essential (primary) hypertension: Secondary | ICD-10-CM

## 2015-12-04 DIAGNOSIS — Z87898 Personal history of other specified conditions: Secondary | ICD-10-CM | POA: Diagnosis not present

## 2015-12-04 DIAGNOSIS — Z8739 Personal history of other diseases of the musculoskeletal system and connective tissue: Secondary | ICD-10-CM | POA: Insufficient documentation

## 2015-12-04 NOTE — Progress Notes (Signed)
Subjective: Chief Complaint  Patient presents with  . Follow-up    on diabetes. seeing surgeon about her back and is getting referred to neurologist or physical therapist per pt. fasting sugars 115-183   Here for f/u on chronic issues.  She has hx/o diabetes, hypertension, hyperlipidemia, glaucoma.  Diabetes - Checking glucose, seeing numbers fasting ranging 115-183.  Taking Lantus 30 u QHS.   Using Novolog 10 u with meals.   Had HgbA1C last Monday, but never heard back about results.  She does report tanzeum got her sugars down to 8% Hgba1c but this was too expensive and she had side effect from it.    Her morning sugars are averaging 140s.  Hyperlipidemia - Taking Pravachol 20mg  daily for cholesterol, not having any issues with this.  taking ASA 81mg  QHS.  Hypertension - compliant with Amlodipine 10mg  and Lisinopril HCT 20/25mg  daily.  Taking potassium daily.  Right after surgery had some trembling in muscles, quivering, had this in right leg, left arm, and top of lip on left.   Still gets this in the lip.   Lately having pain in low back on right, burning sensations right leg.  Has burning sensation in right groin, upper thigh.  Will be seeing neurologist about this.  This has been ongoing since surgery.  No recent trauma or fall or injury.     Anemia - No prior colonoscopy.   Takes iron daily.  Just had hysterectomy in late April 2017.  Gynecologist, Dr. Bradly Bienenstock.   Past Medical History  Diagnosis Date  . Hypertension   . Myopia   . Glaucoma     Dr. Einar Gip  . GERD (gastroesophageal reflux disease)   . H/O mammogram 2005  . Routine gynecological examination     last pap 2005  . Hyperlipidemia   . Diabetes mellitus without complication [facility])     age 50yo  . Legally blind   . Heart murmur   . Intermittent palpitations   . Shortness of breath dyspnea   . Thyroid nodule   . Headache   . Sinusitis   . Pneumonia   . Bronchitis   . Seasonal allergies   . Arthritis      knees  . Anemia    Current Outpatient Prescriptions on File Prior to Visit  Medication Sig Dispense Refill  . amLODipine (NORVASC) 10 MG tablet Take 1 tablet (10 mg total) by mouth daily. 90 tablet 3  . aspirin EC 81 MG tablet Take 1 tablet (81 mg total) by mouth daily. 90 tablet 3  . Fe Fum-FePoly-Vit C-Vit B3 (INTEGRA) 62.5-62.5-40-3 MG CAPS Take 1 capsule by mouth daily.    . insulin aspart (NOVOLOG FLEXPEN) 100 UNIT/ML FlexPen Inject 8 Units into the skin 3 (three) times daily with meals. Use sliding scale we provided 15 mL 5  . Insulin Glargine (LANTUS) 100 UNIT/ML Solostar Pen Inject 20 Units into the skin daily at 10 pm. (Patient taking differently: Inject 30 Units into the skin daily at 10 pm. ) 15 mL 5  . Insulin Pen Needle 32G X 6 MM MISC Use pen needles for Novolog 100 each 2  . latanoprost (XALATAN) 0.005 % ophthalmic solution Place 1 drop into both eyes at bedtime.    [name] lisinopril-hydrochlorothiazide (PRINZIDE,ZESTORETIC) 20-25 MG tablet 1 tablet po daily 90 tablet 1  . potassium chloride SA (KLOR-CON M20) 20 MEQ tablet Take 1 tablet (20 mEq total) by mouth 2 (two) times daily. 180 tablet 1  . pravastatin (PRAVACHOL)  20 MG tablet Take 1 tablet (20 mg total) by mouth daily. 90 tablet 1  . ibuprofen (ADVIL,MOTRIN) 600 MG tablet Take 1 tablet (600 mg total) by mouth every 6 (six) hours as needed (mild pain). (Patient not taking: Reported on 12/04/2015) 30 tablet 0   No current facility-administered medications on file prior to visit.   Past Surgical History  Procedure Laterality Date  . Lasik      x2  . Incision and drainage      abdominal superficial abscess  . Cystectomy      umbilicus  . Laparoscopic vaginal hysterectomy with salpingectomy Bilateral 09/13/2015    Procedure: LAPAROSCOPIC ASSISTED VAGINAL HYSTERECTOMY WITH SALPINGECTOMY, McCalls Colpoplasty;  Surgeon: Thurnell Lose, MD;  Location: [place] [facility];  Service: Gynecology;  Laterality: Bilateral;    ROS as in  subjective   Objective: BP 130/78 mmHg  Pulse 98  Wt 148 lb (67.132 kg)  LMP 08/15/2015 (Exact Date)  BP Readings from Last 3 Encounters:  12/04/15 130/78  10/24/15 126/70  09/26/15 119/69   General appearance: alert, no distress, WD/WN, AA female Neck: supple, no lymphadenopathy, no thyromegaly, no masses Heart: 2/6 holosystolic murmur heard best in upper sternal borders, otherwise RRR, normal S1, S2 Lungs: CTA bilaterally, no wheezes, rhonchi, or rales Tender low back paraspinal region on the right, otherwise nontender Pain in right hip and low back with right hip internal ROM and SLR, otherwise legs nontender, no deformity Neuro: 3+ DTR right patellar, otherwise 2+ DTRs, normal sensation and strength of legs, +SLR on right at 30 degrees.  Arms with normal strength, sensation, DTRs,  Visual acuity not examined given hx/o glaucoma and limited acuity given underlying history, otherwise CN2-12 intact.   Pulses: 1+ symmetric, upper and lower extremities, normal cap refill    Assessment: Encounter Diagnoses  Name Primary?  . Diabetes mellitus with complication in adult patient ([place]) Yes  . Essential hypertension   . Glaucoma   . HYPERCHOLESTEROLEMIA   . Anemia, iron deficiency   . History of burning pain in leg   . Right-sided low back pain with right-sided sciatica   . Special screening for malignant neoplasms, colon     Plan: Diabetes - discussed her concerns.   C/t current medications, labs today.  Pravachol was added back in May 2017.  C/t glucose monitoring.  Of note insurance will only pay for once daily glucometer testing.  C/t Lantus 30 u QHS and Novolog 10u with meals.   HTN - at goal, c/t same medications  Hyperlipidemia - C/t current medications, labs today.  Pravachol was added back in May 2017.   Anemia - hopefully improved since hysterectomy.  Lab today  Advised colonoscopy in coming months.   Referral to GI.  She will check with gynecology as they  reportedly referred her to neurology about the back and leg pain, burning in leg.   Jamel was seen today for follow-up.  Diagnoses and all orders for this visit:  Diabetes mellitus with complication in adult patient [facility]) -     Comprehensive metabolic panel -     Hemoglobin A1c  Essential hypertension -     Lipid panel -     Comprehensive metabolic panel  Glaucoma  HYPERCHOLESTEROLEMIA -     Lipid panel  Anemia, iron deficiency -     CBC  History of burning pain in leg  Right-sided low back pain with right-sided sciatica  Special screening for malignant neoplasms, colon

## 2015-12-05 ENCOUNTER — Telehealth: Payer: Self-pay | Admitting: Medical

## 2015-12-05 ENCOUNTER — Other Ambulatory Visit: Payer: Self-pay | Admitting: Medical

## 2015-12-05 LAB — COMPREHENSIVE METABOLIC PANEL
ALT: 18 U/L (ref 6–29)
AST: 16 U/L (ref 10–35)
Albumin: 4.6 g/dL (ref 3.6–5.1)
Alkaline Phosphatase: 74 U/L (ref 33–130)
BUN: 17 mg/dL (ref 7–25)
CO2: 29 mmol/L (ref 20–31)
Calcium: 10 mg/dL (ref 8.6–10.4)
Chloride: 103 mmol/L (ref 98–110)
Creat: 0.97 mg/dL (ref 0.50–1.05)
Glucose, Bld: 73 mg/dL (ref 65–99)
Potassium: 3.4 mmol/L — ABNORMAL LOW (ref 3.5–5.3)
Sodium: 142 mmol/L (ref 135–146)
Total Bilirubin: 0.7 mg/dL (ref 0.2–1.2)
Total Protein: 7.6 g/dL (ref 6.1–8.1)

## 2015-12-05 LAB — LIPID PANEL
Cholesterol: 161 mg/dL (ref 125–200)
HDL: 48 mg/dL (ref >=46)
LDL Cholesterol: 100 mg/dL (ref <130)
Total CHOL/HDL Ratio: 3.4 Ratio (ref <=5.0)
Triglycerides: 63 mg/dL (ref <150)
VLDL: 13 mg/dL (ref <30)

## 2015-12-05 LAB — HEMOGLOBIN A1C
Hgb A1c MFr Bld: 9.1 % — ABNORMAL HIGH (ref <5.7)
Mean Plasma Glucose: 214 mg/dL

## 2015-12-05 LAB — CBC
HCT: 37.2 % (ref 35.0–45.0)
Hemoglobin: 12.1 g/dL (ref 11.7–15.5)
MCH: 28.1 pg (ref 27.0–33.0)
MCHC: 32.5 g/dL (ref 32.0–36.0)
MCV: 86.5 fL (ref 80.0–100.0)
MPV: 10.4 fL (ref 7.5–12.5)
Platelets: 325 10*3/uL (ref 140–400)
RBC: 4.3 MIL/uL (ref 3.80–5.10)
RDW: 13.9 % (ref 11.0–15.0)
WBC: 6.5 10*3/uL (ref 4.0–10.5)

## 2015-12-05 MED ORDER — ASPIRIN EC 81 MG PO TBEC: 81.0000 mg | DELAYED_RELEASE_TABLET | Freq: Every day | ORAL | Status: DC

## 2015-12-05 MED ORDER — AMLODIPINE BESYLATE 10 MG PO TABS: 10.0000 mg | ORAL_TABLET | Freq: Every day | ORAL | Status: DC

## 2015-12-05 MED ORDER — POTASSIUM CHLORIDE CRYS ER 20 MEQ PO TBCR: 20.0000 meq | EXTENDED_RELEASE_TABLET | Freq: Two times a day (BID) | ORAL | Status: DC

## 2015-12-05 MED ORDER — INSULIN PEN NEEDLE 32G X 6 MM MISC: Status: DC

## 2015-12-05 MED ORDER — INSULIN GLARGINE 100 UNIT/ML SOLOSTAR PEN: 40.0000 [IU] | PEN_INJECTOR | Freq: Every day | SUBCUTANEOUS | Status: DC

## 2015-12-05 MED ORDER — LISINOPRIL-HYDROCHLOROTHIAZIDE 20-25 MG PO TABS: ORAL_TABLET | ORAL | Status: DC

## 2015-12-05 MED ORDER — PRAVASTATIN SODIUM 20 MG PO TABS: 20.0000 mg | ORAL_TABLET | Freq: Every day | ORAL | Status: DC

## 2015-12-05 MED ORDER — INSULIN ASPART 100 UNIT/ML FLEXPEN: 12.0000 [IU] | PEN_INJECTOR | Freq: Three times a day (TID) | SUBCUTANEOUS | Status: DC

## 2015-12-05 NOTE — Telephone Encounter (Signed)
Rcvd labs from [facility] with note that all labs sent (no A1C)

## 2015-12-15 ENCOUNTER — Encounter: Payer: Self-pay | Admitting: Medical

## 2015-12-20 ENCOUNTER — Encounter: Payer: Self-pay | Admitting: Neurology

## 2015-12-20 ENCOUNTER — Ambulatory Visit (INDEPENDENT_AMBULATORY_CARE_PROVIDER_SITE_OTHER): Payer: BLUE CROSS/BLUE SHIELD | Admitting: Neurology

## 2015-12-20 VITALS — BP 130/70 | HR 97 | Ht 61.0 in | Wt 152.2 lb

## 2015-12-20 DIAGNOSIS — M792 Neuralgia and neuritis, unspecified: Secondary | ICD-10-CM

## 2015-12-20 DIAGNOSIS — R292 Abnormal reflex: Secondary | ICD-10-CM | POA: Diagnosis not present

## 2015-12-20 DIAGNOSIS — M48061 Spinal stenosis, lumbar region without neurogenic claudication: Secondary | ICD-10-CM

## 2015-12-20 DIAGNOSIS — M4806 Spinal stenosis, lumbar region: Secondary | ICD-10-CM | POA: Diagnosis not present

## 2015-12-20 DIAGNOSIS — M5441 Lumbago with sciatica, right side: Secondary | ICD-10-CM

## 2015-12-20 MED ORDER — CYCLOBENZAPRINE HCL 5 MG PO TABS: 5.0000 mg | ORAL_TABLET | Freq: Every evening | ORAL | 5 refills | Status: DC | PRN

## 2015-12-20 NOTE — Progress Notes (Signed)
Note routed

## 2015-12-20 NOTE — Patient Instructions (Addendum)
1.  MRI lumbar spine without contrast 2.  Start flexeril 5mg  at bedtime as needed for low back pain 3.  If your right hand tingling gets worse, call my office and we can schedule electrodiagnostic testing.  In the meantime, try to avoid flexing your arm as you sleep  Return to clinic in 3 months

## 2015-12-20 NOTE — Progress Notes (Signed)
[facility] - Initial Visit   Date: 12/20/15  Tina Horton MRN: [id number] DOB: December 24, 1965   Dear Dr. Simona Huh:  Thank you for your kind referral of Tina Horton for consultation of right groin and leg pain. Although her history is well known to you, please allow [place] to reiterate it for the purpose of our medical record. The patient was accompanied to the clinic by husband who also provides collateral information.    History of Present Illness: Tina Horton is a 50 y.o. right-handed African American female with insulin-dependent diabetes mellitus, hyperlipidemia, hypertension, severe myopia and glaucoma - essentially legally blind but able to see light OU presenting for evaluation of right groin and leg pain.    She underwent laparoscopic vaginal total hysterectomy with salpingectomy on April 27th, 2017.  A week following her recovery after she was moving and walking better, she began noticing burrning pain involving her right groin/medial upper thigh, and also shooting pain down her legs, occurring with activity, such as walking or rolling in bed.  She feels that if her right leg moves, twists, or turns, the pain is exacerbated.  Pain is usually present for several hours and she often treats it with ibuprofen which helps.  She initially thought it was normal part of recovery following surgery, but when symptoms started worsening she went to see her gynecologist who referred her here.  She also complains of low back pain which has been present even before surgery, but has become much more constant and frequent.  It is worse with prolonged sitting and standing.  At rest, it is nothing bothersome.  She also has tingling over the the lateral portion of the foot on the right side.  She has tried ibuprofen for her pain, which helps.   She reports having right shoulder pain and tingling involving the right hand.  The last two sigits on the right side  usually  There is no weakness of the hands or legs.   Of note, prior to her surgery she started insulin for better control of her diabetes and has noticed that her blood sugars were doing much better while hospitalized.  She denies any recent weight loss.   Out-side paper records, electronic medical record, and images have been reviewed where available and summarized as:  Lab Results  Component Value Date   TSH 1.65 08/21/2015   Lab Results  Component Value Date   HGBA1C 9.1 (H) 12/04/2015    Past Medical History:  Diagnosis Date  . Anemia   . Arthritis    knees  . Bronchitis   . Diabetes mellitus without complication [facility])    age 59yo  . GERD (gastroesophageal reflux disease)   . Glaucoma    Dr. Einar Gip  . H/O mammogram 2005  . Headache   . Heart murmur   . Hyperlipidemia   . Hypertension   . Intermittent palpitations   . Legally blind   . Myopia   . Pneumonia   . Routine gynecological examination    last pap 2005  . Seasonal allergies   . Shortness of breath dyspnea   . Sinusitis   . Thyroid nodule     Past Surgical History:  Procedure Laterality Date  . CYSTECTOMY     umbilicus  . INCISION AND DRAINAGE     abdominal superficial abscess  . LAPAROSCOPIC VAGINAL HYSTERECTOMY WITH SALPINGECTOMY Bilateral 09/13/2015   Procedure: LAPAROSCOPIC ASSISTED VAGINAL HYSTERECTOMY WITH SALPINGECTOMY, McCalls Colpoplasty;  Surgeon:  Thurnell Lose, MD;  Location: [facility];  Service: Gynecology;  Laterality: Bilateral;  . LASIK     x2     Medications:  Outpatient Encounter Prescriptions as of 12/20/2015  Medication Sig  . amLODipine (NORVASC) 10 MG tablet Take 1 tablet (10 mg total) by mouth daily.  [name] aspirin EC 81 MG tablet Take 1 tablet (81 mg total) by mouth daily.  [name] ibuprofen (ADVIL,MOTRIN) 600 MG tablet Take 1 tablet (600 mg total) by mouth every 6 (six) hours as needed (mild pain).  . insulin aspart (NOVOLOG FLEXPEN) 100 UNIT/ML FlexPen Inject 12 Units into the  skin 3 (three) times daily with meals. Use sliding scale we provided  . Insulin Glargine (LANTUS) 100 UNIT/ML Solostar Pen Inject 40 Units into the skin daily at 10 pm.  . Insulin Pen Needle 32G X 6 MM MISC Use pen needles for Novolog  . latanoprost (XALATAN) 0.005 % ophthalmic solution Place 1 drop into both eyes at bedtime.  [name] lisinopril-hydrochlorothiazide (PRINZIDE,ZESTORETIC) 20-25 MG tablet 1 tablet po daily  . potassium chloride SA (KLOR-CON M20) 20 MEQ tablet Take 1 tablet (20 mEq total) by mouth 2 (two) times daily.  . pravastatin (PRAVACHOL) 20 MG tablet Take 1 tablet (20 mg total) by mouth daily.  . cyclobenzaprine (FLEXERIL) 5 MG tablet Take 1 tablet (5 mg total) by mouth at bedtime as needed for muscle spasms (low back pain).  . [DISCONTINUED] Fe Fum-FePoly-Vit C-Vit B3 (INTEGRA) 62.5-62.5-40-3 MG CAPS Take 1 capsule by mouth daily.   No facility-administered encounter medications on file as of 12/20/2015.      Allergies:  Allergies  Allergen Reactions  . Metformin And Related     Vomiting, diarrhea, nausea  . Tanzeum [Albiglutide] Nausea And Vomiting    nausea    Family History: Family History  Problem Relation Age of Onset  . Diabetes Mother   . Hypertension Mother   . Stroke Mother   . Hypertension Father   . Chronic Renal Failure Father   . Hypertension Sister   . Hypertension Brother   . Stroke Maternal Grandmother   . Asthma Daughter   . Healthy Daughter   . Healthy Son   . Cancer Neg Hx   . Heart disease Neg Hx     Social History: Social History  Substance Use Topics  . Smoking status: Never Smoker  . Smokeless tobacco: Never Used  . Alcohol use No   Social History   Social History Narrative   Married, has 3 children, not exercising, but walks at work.     Lives in a one story home.     On disability.  Education: some college.    Review of Systems:  CONSTITUTIONAL: No fevers, chills, night sweats, or weight loss.   EYES: No visual changes or  eye pain ENT: No hearing changes.  No history of nose bleeds.   RESPIRATORY: No cough, wheezing and shortness of breath.   CARDIOVASCULAR: Negative for chest pain, and palpitations.   GI: Negative for abdominal discomfort, blood in stools or black stools.  No recent change in bowel habits.   GU:  No history of incontinence.   MUSCLOSKELETAL: +history of joint pain or swelling.  +myalgias.   SKIN: Negative for lesions, rash, and itching.   HEMATOLOGY/ONCOLOGY: Negative for prolonged bleeding, bruising easily, and swollen nodes.  No history of cancer.   ENDOCRINE: Negative for cold or heat intolerance, polydipsia or goiter.   PSYCH:  No depression or anxiety symptoms.  NEURO: As Above.   Vital Signs:  BP 130/70   Pulse 97   Ht 5\' 1"  (1.549 m)   Wt 152 lb 3 oz (69 kg)   LMP 09/13/2015   SpO2 98%   BMI 28.76 kg/m    General Medical Exam:   General:  Well appearing, comfortable.   Eyes/ENT: see cranial nerve examination.   Neck: No masses appreciated.  Full range of motion without tenderness.  No carotid bruits. Respiratory:  Clear to auscultation, good air entry bilaterally.   Cardiac:  Regular rate and rhythm, no murmur.   Extremities:  No deformities, edema, or skin discoloration.  Skin:  No rashes or lesions.  Neurological Exam: MENTAL STATUS including orientation to time, place, person, recent and remote memory, attention span and concentration, language, and fund of knowledge is normal.  Speech is not dysarthric.  CRANIAL NERVES: II:  Light perception only in left eye, gross movements are seen in the right eye. Unremarkable fundi.   III-IV-VI: Pupils equal round and reactive to light.  Normal conjugate, extra-ocular eye movements in all directions of gaze.  No nystagmus.  No ptosis.   V:  Normal facial sensation.    VII:  Normal facial symmetry and movements.  No pathologic facial reflexes.  VIII:  Normal hearing and vestibular function.   IX-X:  Normal palatal movement.     XI:  Normal shoulder shrug and head rotation.   XII:  Normal tongue strength and range of motion, no deviation or fasciculation.  MOTOR:  No atrophy, fasciculations or abnormal movements.  No pronator drift.  Tone is normal.    Right Upper Extremity:    Left Upper Extremity:    Deltoid  5/5   Deltoid  5/5   Biceps  5/5   Biceps  5/5   Triceps  5/5   Triceps  5/5   Wrist extensors  5/5   Wrist extensors  5/5   Wrist flexors  5/5   Wrist flexors  5/5   Finger extensors  5/5   Finger extensors  5/5   Finger flexors  5/5   Finger flexors  5/5   Dorsal interossei  5/5   Dorsal interossei  5/5   Abductor pollicis  5/5   Abductor pollicis  5/5   Tone (Ashworth scale)  0  Tone (Ashworth scale)  0   Right Lower Extremity:    Left Lower Extremity:    Hip flexors  5/5   Hip flexors  5/5   Hip extensors  5/5   Hip extensors  5/5   Knee flexors  5/5   Knee flexors  5/5   Knee extensors  5/5   Knee extensors  5/5   Dorsiflexors  5/5   Dorsiflexors  5/5   Plantarflexors  5/5   Plantarflexors  5/5   Toe extensors  5/5   Toe extensors  5/5   Toe flexors  5/5   Toe flexors  5/5   Tone (Ashworth scale)  0+  Tone (Ashworth scale)  0   MSRs:  Right                                                                 Left brachioradialis 2+  brachioradialis 2+  biceps 2+  biceps 2+  triceps 2+  triceps 2+  patellar 3+  patellar 3+  ankle jerk 2+  ankle jerk 2+  Hoffman no  Hoffman no  plantar response down  plantar response down  Crossed adductors present bilaterally, no clonus  SENSORY:  Normal and symmetric perception of light touch, pinprick, vibration, and proprioception.  Romberg's sign absent.   COORDINATION/GAIT: Normal finger-to- nose-finger.  Intact rapid alternating movements bilaterally. Unable to rise from a chair without using arms.  Gait appears slow and antalgic.   IMPRESSION: 1.  Right sided radicular groin and leg pain following laparoscopic hysterectomy and salpingectomy (April  2017).  Differential include gentifemoral/ilioinguinal neuropathy vs high lumbar canal stenosis/disc herniation involving L1-2 nerve roots.  With her lower extremity hyperreflexia and associated low back pain, the likelihood of nerve compression in the lumbar spine is greater than isolated peripheral nerve injury.    - Proceed with MRI lumbar spine without to look for structural nerve impingement  - NCS/EMG can be performed, but this is too limited for high lumbar lesions and cannot test for proximal sensory neuropathies  - Start flexeril 5mg  at bedtime as needed for low back pain.  - Gabapentin was declined because painful paresthesias are intermittent   2.  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   The duration of this appointment visit was 45 minutes of face-to-face time with the patient.  Greater than 50% of this time was spent in counseling, explanation of diagnosis, planning of further management, and coordination of care.   Thank you for allowing me to participate in patient's care.  If I can answer any additional questions, I would be pleased to do so.    Sincerely,    Donika K. Posey Pronto, DO

## 2015-12-27 ENCOUNTER — Telehealth: Payer: Self-pay | Admitting: Neurology

## 2015-12-27 NOTE — Telephone Encounter (Signed)
Noted  

## 2015-12-27 NOTE — Telephone Encounter (Signed)
MRI lumbar spine approved [id number], valid until 01/25/2016 at [facility].

## 2015-12-30 ENCOUNTER — Ambulatory Visit
Admission: RE | Admit: 2015-12-30 | Discharge: 2015-12-30 | Disposition: A | Discharge status: Home / Self Care | Payer: BLUE CROSS/BLUE SHIELD | Source: Ambulatory Visit | Attending: Neurology | Admitting: Neurology

## 2015-12-30 DIAGNOSIS — M5441 Lumbago with sciatica, right side: Secondary | ICD-10-CM

## 2015-12-30 DIAGNOSIS — M792 Neuralgia and neuritis, unspecified: Secondary | ICD-10-CM

## 2015-12-30 DIAGNOSIS — M48061 Spinal stenosis, lumbar region without neurogenic claudication: Secondary | ICD-10-CM

## 2015-12-30 DIAGNOSIS — R292 Abnormal reflex: Secondary | ICD-10-CM

## 2016-01-02 ENCOUNTER — Telehealth: Payer: Self-pay | Admitting: Neurology

## 2016-01-02 NOTE — Telephone Encounter (Signed)
Called and discussed results of MRI lumbar spine with patient which shows (1) a 5 cm left renal cyst with apparent left hydronephrosis. 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 Subarticular stenosis on left could cause impingement of the left L5 nerve root.  No evidence of neural impingement on the right side which is where she is symptomatic.  I suspect that she sustained peripheral nerve injury specifically localized to the gentifemoral/ilioinguinal nerve, fortunately, she has noticed gradual improvement.    I will send her to PT for leg stretching and strengthening for low back.  Regarding her left renal cyst, I will request her PCP to further evaluate this with renal US.    Tina Horton K. Posey Pronto, DO

## 2016-01-03 ENCOUNTER — Other Ambulatory Visit: Payer: Self-pay | Admitting: Medical

## 2016-01-03 DIAGNOSIS — N281 Cyst of kidney, acquired: Secondary | ICD-10-CM

## 2016-01-03 DIAGNOSIS — N1330 Unspecified hydronephrosis: Secondary | ICD-10-CM

## 2016-01-03 NOTE — Telephone Encounter (Signed)
Referral sent for PT

## 2016-01-03 NOTE — Telephone Encounter (Signed)
Neurology sent info to [place] regarding renal cyst on scan.  Please set up for renal ultrasound given renal cyst.  Schedule her f/u for a few days after ultrasound to discuss.

## 2016-01-04 NOTE — Telephone Encounter (Signed)
Pt is scheduled on 01/09/16 @ 3:40pm at [facility] [address]- no voiding 1 hour prior. Pt is scheduled for 8/28 with Audelia Acton to discuss U/S

## 2016-01-09 ENCOUNTER — Ambulatory Visit
Admission: RE | Admit: 2016-01-09 | Discharge: 2016-01-09 | Disposition: A | Discharge status: Home / Self Care | Payer: BLUE CROSS/BLUE SHIELD | Source: Ambulatory Visit | Attending: Medical | Admitting: Medical

## 2016-01-09 DIAGNOSIS — N281 Cyst of kidney, acquired: Secondary | ICD-10-CM

## 2016-01-09 DIAGNOSIS — N1330 Unspecified hydronephrosis: Secondary | ICD-10-CM

## 2016-01-15 ENCOUNTER — Ambulatory Visit (INDEPENDENT_AMBULATORY_CARE_PROVIDER_SITE_OTHER): Payer: BLUE CROSS/BLUE SHIELD | Admitting: Medical

## 2016-01-15 ENCOUNTER — Encounter: Payer: Self-pay | Admitting: Medical

## 2016-01-15 VITALS — BP 136/78 | HR 100 | Wt 151.0 lb

## 2016-01-15 DIAGNOSIS — I10 Essential (primary) hypertension: Secondary | ICD-10-CM

## 2016-01-15 DIAGNOSIS — R300 Dysuria: Secondary | ICD-10-CM | POA: Diagnosis not present

## 2016-01-15 DIAGNOSIS — J329 Chronic sinusitis, unspecified: Secondary | ICD-10-CM

## 2016-01-15 DIAGNOSIS — N1330 Unspecified hydronephrosis: Secondary | ICD-10-CM

## 2016-01-15 DIAGNOSIS — N281 Cyst of kidney, acquired: Secondary | ICD-10-CM

## 2016-01-15 DIAGNOSIS — M545 Low back pain, unspecified: Secondary | ICD-10-CM

## 2016-01-15 DIAGNOSIS — M4806 Spinal stenosis, lumbar region: Secondary | ICD-10-CM

## 2016-01-15 DIAGNOSIS — Q6100 Congenital renal cyst, unspecified: Secondary | ICD-10-CM | POA: Diagnosis not present

## 2016-01-15 DIAGNOSIS — M48061 Spinal stenosis, lumbar region without neurogenic claudication: Secondary | ICD-10-CM

## 2016-01-15 DIAGNOSIS — J309 Allergic rhinitis, unspecified: Secondary | ICD-10-CM | POA: Diagnosis not present

## 2016-01-15 DIAGNOSIS — M4800 Spinal stenosis, site unspecified: Secondary | ICD-10-CM | POA: Insufficient documentation

## 2016-01-15 DIAGNOSIS — R0982 Postnasal drip: Secondary | ICD-10-CM

## 2016-01-15 DIAGNOSIS — I889 Nonspecific lymphadenitis, unspecified: Secondary | ICD-10-CM | POA: Diagnosis not present

## 2016-01-15 LAB — POCT URINALYSIS DIPSTICK
Bilirubin, UA: NEGATIVE
Blood, UA: NEGATIVE
Glucose, UA: NEGATIVE
Ketones, UA: NEGATIVE
Leukocytes, UA: NEGATIVE
Nitrite, UA: NEGATIVE
Protein, UA: NEGATIVE
Spec Grav, UA: 1.015
Urobilinogen, UA: NEGATIVE
pH, UA: 7

## 2016-01-15 NOTE — Patient Instructions (Signed)
Recommendations:  I recommend you begin the Flexeril muscle relaxer at bedtime for the next few days and see if this gives some relief of the back pain  Avoid taking aleve, ibuprofen, Advil  Consider doing physical therapy per your neurologist for back pain  Follow up with CT scan per Dr. Borden/Urology  Urine results were normal today  Begin OTC Zyrtec or Allegra at bedtime for lymph node swelling and drainage and allergy symptoms.  If not much improved in 2 weeks, let me know.

## 2016-01-15 NOTE — Progress Notes (Signed)
Subjective: Chief Complaint  Patient presents with  . Advice Only    discuss U/S and has CT scan wednesday   Having some issues.  Mainly here to discuss kidney cyst found on recent MRI and subsequently evaluated by ultrasound.  MRI was ordered by neurology due to low back pains and pains in legs, mainly right leg.   We referred to urology regarding hydronephrosis and cyst.  She has already had consult with urology, has CT abdomen scheduled, but wanted to come in to discuss.   She gets intermittent pains in left abdomen/flank but not sure they are not her chronic back pains.   Back pain is ongoing, at times limiting. Having pain today in low back but also some pain in low groin/bladder area.   No burning with urination, no odor to urine, not cloudy.   Gets intermittent pains in left side, for example one day while on toilet, felt pain in lower pelvis/bladder issue.     Regarding back pains, saw neurology was prescribed flexeril, referred to PT, but she hasn't taken flexeril or started PT.   Wants to get the kidney issue resolved first.    She reports swollen lymph node left neck, has some post nasal drainage, headache, sinus pressure.  No other aggravating or relieving factors. No other complaint.     Past Medical History:  Diagnosis Date  . Anemia   . Arthritis    knees  . Bronchitis   . Diabetes mellitus without complication [facility])    age 50yo  . GERD (gastroesophageal reflux disease)   . Glaucoma    Dr. Einar Gip  . H/O mammogram 2005  . Headache   . Heart murmur   . Hyperlipidemia   . Hypertension   . Intermittent palpitations   . Legally blind   . Myopia   . Pneumonia   . Routine gynecological examination    last pap 2005  . Seasonal allergies   . Shortness of breath dyspnea   . Sinusitis   . Thyroid nodule    Current Outpatient Prescriptions on File Prior to Visit  Medication Sig Dispense Refill  . amLODipine (NORVASC) 10 MG tablet Take 1 tablet (10 mg total) by mouth  daily. 90 tablet 3  . aspirin EC 81 MG tablet Take 1 tablet (81 mg total) by mouth daily. 90 tablet 3  . insulin aspart (NOVOLOG FLEXPEN) 100 UNIT/ML FlexPen Inject 12 Units into the skin 3 (three) times daily with meals. Use sliding scale we provided 30 mL 5  . Insulin Glargine (LANTUS) 100 UNIT/ML Solostar Pen Inject 40 Units into the skin daily at 10 pm. 30 mL 5  . latanoprost (XALATAN) 0.005 % ophthalmic solution Place 1 drop into both eyes at bedtime.    [name] lisinopril-hydrochlorothiazide (PRINZIDE,ZESTORETIC) 20-25 MG tablet 1 tablet po daily 90 tablet 1  . potassium chloride SA (KLOR-CON M20) 20 MEQ tablet Take 1 tablet (20 mEq total) by mouth 2 (two) times daily. 180 tablet 3  . ibuprofen (ADVIL,MOTRIN) 600 MG tablet Take 1 tablet (600 mg total) by mouth every 6 (six) hours as needed (mild pain). (Patient not taking: Reported on 01/15/2016) 30 tablet 0  . Insulin Pen Needle 32G X 6 MM MISC Use pen needles for Novolog 100 each 5  . pravastatin (PRAVACHOL) 20 MG tablet Take 1 tablet (20 mg total) by mouth daily. (Patient not taking: Reported on 01/15/2016) 90 tablet 3   No current facility-administered medications on file prior to visit.  Past Surgical History:  Procedure Laterality Date  . CYSTECTOMY     umbilicus  . INCISION AND DRAINAGE     abdominal superficial abscess  . LAPAROSCOPIC VAGINAL HYSTERECTOMY WITH SALPINGECTOMY Bilateral 09/13/2015   Procedure: LAPAROSCOPIC ASSISTED VAGINAL HYSTERECTOMY WITH SALPINGECTOMY, McCalls Colpoplasty;  Surgeon: Thurnell Lose, MD;  Location: [facility];  Service: Gynecology;  Laterality: Bilateral;  . LASIK     x2     Objective: BP 136/78   Pulse 100   Wt 151 lb (68.5 kg)   LMP 09/13/2015   BMI 28.53 kg/m   BP Readings from Last 3 Encounters:  01/15/16 136/78  12/20/15 130/70  12/04/15 130/78   Gen: wd, wn, nad Abdomen: nontender, no mass, no organomegaly, no rebound Legs nonnteder, hip ROM mostly normal without pain, L>R ROM of hips.   Rest of legs nontender and no deformity Neuro: -SLR, leg strength seems normal Tender lumbar region in general, slow with ROM today Tender left somewhat enlarged post auricular node and tender submandibular nodes, otherwise ENT unremarkable Lungs clear    Assessment: Encounter Diagnoses  Name Primary?  . Renal cyst Yes  . Hydronephrosis, unspecified hydronephrosis type   . Essential hypertension   . Spinal stenosis of lumbar region   . Bilateral low back pain without sciatica   . Lymphadenitis   . Dysuria   . Allergic rhinitis, unspecified allergic rhinitis type   . Post-nasal drainage     Plan: Renal cyst, hydronephrosis - discussed her recent visit with Dr. Alinda Money.  Advised that her primary low back pain is likely not related to the renal cyst.  Advised she go for CT as planned and f/u with Dr. Alinda Money with results and recommendations  HTN - c/t same medication, avoid NSAIDs  Spinal stenosis, low back pain - reviewed recent MRI results, neurology notes.   She has not begun flexeril or PT.  Advised she go ahead and use the flexeril and consider starting the therapy  Lymphadenitis, allergic rhinitis - lymph nodes likely reactive.   Begin allergy medication QHS such as zyrtec, and if not improving in 2wk, then recheck  Dysuria - UA reviewed, normal.    Liona was seen today for advice only.  Diagnoses and all orders for this visit:  Renal cyst  Hydronephrosis, unspecified hydronephrosis type  Essential hypertension  Spinal stenosis of lumbar region  Bilateral low back pain without sciatica  Lymphadenitis  Dysuria -     POCT urinalysis dipstick  Allergic rhinitis, unspecified allergic rhinitis type  Post-nasal drainage

## 2016-01-16 ENCOUNTER — Telehealth: Payer: Self-pay | Admitting: Medical

## 2016-01-16 ENCOUNTER — Telehealth: Payer: Self-pay | Admitting: Neurology

## 2016-01-16 NOTE — Telephone Encounter (Signed)
Patient notified that referral will be sent for PT.

## 2016-01-16 NOTE — Telephone Encounter (Signed)
Pt trying to get a lift chair from [facility] however they are requiring a prescription for the chair from her doctor in order to file the cost to insurance. Pt said she need the chair for the arthritis and pinched nerve in her back. Can Audelia Acton give pt the prescription?

## 2016-01-16 NOTE — Telephone Encounter (Signed)
PT called and was asking about a referral for some physical therapy/[name] CB# [phone]

## 2016-01-17 ENCOUNTER — Telehealth: Payer: Self-pay | Admitting: Neurology

## 2016-01-17 NOTE — Telephone Encounter (Signed)
PT called and wanted to know if she can get a prescription for a lift chair/[name] [phone]

## 2016-01-17 NOTE — Telephone Encounter (Signed)
Pt is aware and she is getting PT referral from Neuro

## 2016-01-17 NOTE — Telephone Encounter (Signed)
She recently saw neurology for back and nerve issues.   I would have to defer this to them.      Find out again what neurology told her to do next, as I think it was physical therapy

## 2016-01-18 ENCOUNTER — Other Ambulatory Visit: Payer: Self-pay | Admitting: *Deleted

## 2016-01-18 DIAGNOSIS — G589 Mononeuropathy, unspecified: Secondary | ICD-10-CM

## 2016-01-18 DIAGNOSIS — M1990 Unspecified osteoarthritis, unspecified site: Secondary | ICD-10-CM

## 2016-01-18 MED ORDER — AMBULATORY NON FORMULARY MEDICATION: 1.0000 [IU] | Freq: Every day | 0 refills | Status: DC

## 2016-01-18 NOTE — Telephone Encounter (Signed)
Patient notified that Rx was sent to [facility].

## 2016-01-19 ENCOUNTER — Other Ambulatory Visit (HOSPITAL_COMMUNITY): Payer: Self-pay | Admitting: Urology

## 2016-01-19 ENCOUNTER — Telehealth: Payer: Self-pay | Admitting: Medical

## 2016-01-19 DIAGNOSIS — N1330 Unspecified hydronephrosis: Secondary | ICD-10-CM

## 2016-01-19 NOTE — Telephone Encounter (Signed)
Pt wanted to let you know that after her CT scan this past Wednesday she has been having burning and pain under left rib and she called her Urologist and they said they didn't think it was related to the CT scan or dye and for her to follow up with her primary care.  She states she has issues with her left kidney.  She states she knows that it didn't start until after the CT scan.  She made an appt with you on Tuesday but wanted me to let you know and see what you thought.

## 2016-01-23 ENCOUNTER — Ambulatory Visit: Payer: BLUE CROSS/BLUE SHIELD | Admitting: Medical

## 2016-01-23 NOTE — Telephone Encounter (Signed)
Pt states that her pain got better over the weekend. She will call back if pain reoccurs.

## 2016-01-23 NOTE — Telephone Encounter (Signed)
I reviewed the phone message.  Its hard to say what is causing the pain.   Does she have rash in that area?  Nausea, vomiting, bruising, redness?   If not, we can discuss and examine when she comes in.  Audelia Acton

## 2016-02-02 ENCOUNTER — Ambulatory Visit: Payer: BLUE CROSS/BLUE SHIELD | Admitting: Neurology

## 2016-02-05 ENCOUNTER — Ambulatory Visit (HOSPITAL_COMMUNITY)
Admission: RE | Admit: 2016-02-05 | Discharge: 2016-02-05 | Disposition: A | Discharge status: Home / Self Care | Payer: BLUE CROSS/BLUE SHIELD | Source: Ambulatory Visit | Attending: Urology | Admitting: Urology

## 2016-02-05 DIAGNOSIS — R93422 Abnormal radiologic findings on diagnostic imaging of left kidney: Secondary | ICD-10-CM | POA: Diagnosis not present

## 2016-02-05 DIAGNOSIS — N1330 Unspecified hydronephrosis: Secondary | ICD-10-CM

## 2016-02-05 MED ORDER — TECHNETIUM TC 99M MERTIATIDE
4.9500 | Freq: Once | INTRAVENOUS | Status: AC | PRN
  Administered 2016-02-05: 4.95 via INTRAVENOUS

## 2016-02-05 MED ORDER — FUROSEMIDE 10 MG/ML IJ SOLN
INTRAMUSCULAR | Status: AC
  Filled 2016-02-05: qty 4

## 2016-02-05 MED ORDER — FUROSEMIDE 10 MG/ML IJ SOLN: 34.0000 mg | Freq: Once | INTRAMUSCULAR | Status: DC

## 2016-02-14 ENCOUNTER — Telehealth: Payer: Self-pay | Admitting: Medical

## 2016-02-14 NOTE — Telephone Encounter (Signed)
Pt called and was wanting some advice, she is taking some colace, she is only having BM once a day, she had a total vaginal hysterometry in April and ever since then she states that she has not been regular since then, states that she has had to take colace, she is use to having at lease to or three bowel movements a day, states it took her bowels a couple of days to wake up after the surgery, she wants to know if there is something else she can take, pt can be reached at [phone] and pt uses Ammie Ferrier [address] [place] [address]

## 2016-02-16 NOTE — Telephone Encounter (Signed)
Have her come in and get some samples of Linzess 126mcg capsules to take once daily and see if this helps.

## 2016-02-19 MED ORDER — LINACLOTIDE 145 MCG PO CAPS: 145.0000 ug | ORAL_CAPSULE | Freq: Every day | ORAL | 0 refills | Status: DC

## 2016-02-19 NOTE — Telephone Encounter (Signed)
Spoke with pt- she verbalized understanding of recommendations. #24 of Linzess 186mcg

## 2016-04-01 ENCOUNTER — Ambulatory Visit: Payer: BLUE CROSS/BLUE SHIELD | Admitting: Neurology

## 2016-05-28 ENCOUNTER — Encounter: Payer: Self-pay | Admitting: Medical

## 2016-05-28 ENCOUNTER — Ambulatory Visit (INDEPENDENT_AMBULATORY_CARE_PROVIDER_SITE_OTHER): Payer: BLUE CROSS/BLUE SHIELD | Admitting: Medical

## 2016-05-28 VITALS — BP 132/68 | HR 97 | Temp 99.2°F | Wt 155.2 lb

## 2016-05-28 DIAGNOSIS — E118 Type 2 diabetes mellitus with unspecified complications: Secondary | ICD-10-CM

## 2016-05-28 DIAGNOSIS — R195 Other fecal abnormalities: Secondary | ICD-10-CM

## 2016-05-28 DIAGNOSIS — I10 Essential (primary) hypertension: Secondary | ICD-10-CM

## 2016-05-28 DIAGNOSIS — J988 Other specified respiratory disorders: Secondary | ICD-10-CM

## 2016-05-28 DIAGNOSIS — R112 Nausea with vomiting, unspecified: Secondary | ICD-10-CM

## 2016-05-28 LAB — POCT URINALYSIS DIPSTICK
Bilirubin, UA: NEGATIVE
Blood, UA: NEGATIVE
Glucose, UA: NEGATIVE
Ketones, UA: NEGATIVE
Leukocytes, UA: NEGATIVE
Nitrite, UA: NEGATIVE
Protein, UA: NEGATIVE
Spec Grav, UA: 1.025
Urobilinogen, UA: NEGATIVE
pH, UA: 6

## 2016-05-28 MED ORDER — LEVOFLOXACIN 500 MG PO TABS: 500.0000 mg | ORAL_TABLET | Freq: Every day | ORAL | 0 refills | Status: DC

## 2016-05-28 MED ORDER — PROMETHAZINE-DM 6.25-15 MG/5ML PO SYRP: 5.0000 mL | ORAL_SOLUTION | Freq: Four times a day (QID) | ORAL | 0 refills | Status: DC | PRN

## 2016-05-28 NOTE — Progress Notes (Signed)
Subjective: Chief Complaint  Patient presents with  . vomitting    vomitting , headache, bodyaches x3 days   Here for flu or cold or something.  everything hurts, has body aches, pains in back and legs.   Nauseated, vomiting for the past 3 days.   Couldn't keep breakfast down, but was able to keep chicken down. Started 3 days ago.   Has cough, runny nose, sneezing, headache.  No fever.  Has had some ear discomfort.   Has some sick contacts, was around step daughter with sinus infection at Christmas, and husband had bronchitis recent.  No contacts with stomach bug or flu.   Stomach felt upset but no diarrhea.  Just had some mild loose stools.  Stomach grumbling.  Has had expiratory congestion, runny nose for a week, but the vomiting and body aches x 3 days.    Sugars in 130s recently.  No other aggravating or relieving factors. No other complaint.  Past Medical History:  Diagnosis Date  . Anemia   . Arthritis    knees  . Bronchitis   . Diabetes mellitus without complication [facility])    age 50yo  . GERD (gastroesophageal reflux disease)   . Glaucoma    Dr. Einar Gip  . H/O mammogram 2005  . Headache   . Heart murmur   . Hyperlipidemia   . Hypertension   . Intermittent palpitations   . Legally blind   . Myopia   . Pneumonia   . Routine gynecological examination    last pap 2005  . Seasonal allergies   . Shortness of breath dyspnea   . Sinusitis   . Thyroid nodule    Current Outpatient Prescriptions on File Prior to Visit  Medication Sig Dispense Refill  . amLODipine (NORVASC) 10 MG tablet Take 1 tablet (10 mg total) by mouth daily. 90 tablet 3  . insulin aspart (NOVOLOG FLEXPEN) 100 UNIT/ML FlexPen Inject 12 Units into the skin 3 (three) times daily with meals. Use sliding scale we provided 30 mL 5  . Insulin Glargine (LANTUS) 100 UNIT/ML Solostar Pen Inject 40 Units into the skin daily at 10 pm. 30 mL 5  . Insulin Pen Needle 32G X 6 MM MISC Use pen needles for Novolog 100 each 5   . lisinopril-hydrochlorothiazide (PRINZIDE,ZESTORETIC) 20-25 MG tablet 1 tablet po daily 90 tablet 1  . AMBULATORY NON FORMULARY MEDICATION 1 Units by Other route daily. Lift chair 1 Units 0  . aspirin EC 81 MG tablet Take 1 tablet (81 mg total) by mouth daily. (Patient not taking: Reported on 05/28/2016) 90 tablet 3  . latanoprost (XALATAN) 0.005 % ophthalmic solution Place 1 drop into both eyes at bedtime.    . potassium chloride SA (KLOR-CON M20) 20 MEQ tablet Take 1 tablet (20 mEq total) by mouth 2 (two) times daily. (Patient not taking: Reported on 05/28/2016) 180 tablet 3   No current facility-administered medications on file prior to visit.    ROS as in subjective   Objective: BP 132/68   Pulse 97   Temp 99.2 F (37.3 C)   Wt 155 lb 3.2 oz (70.4 kg)   LMP 09/13/2015   SpO2 97%   BMI 29.32 kg/m   General appearance: alert, no distress, WD/WN, ill appearing HEENT: normocephalic, sclerae anicteric, conjunctiva pink and moist, TMs flat, nares with swollen turbinated and mucoid discharge or erythema, pharynx normal, tonsils unremarkable Oral cavity: MMM, no lesions Neck: supple, no lymphadenopathy, no thyromegaly, no masses Heart: RRR, normal S1,  S2, no murmurs Lungs: CTA bilaterally, no wheezes, rhonchi, or rales Abdomen: +bs, soft, mild generalized tenderness, no mass, no rigidity Pulses: 2+ symmetric Ext: no edema      Assessment: Encounter Diagnoses  Name Primary?  [name] Respiratory tract infection Yes  . Nausea and vomiting, intractability of vomiting not specified, unspecified vomiting type   . Loose stools   . Diabetes mellitus with complication in adult patient ([place])   . Essential hypertension      Plan: Discussed symptoms, exam findings, UA results.  Will treat for respiratory infection and promethazine to help with cough and nausea.   Can use OTC pepto bismol, push clear fluids, diet as tolerated next few days.  If not much improved in the next 3-4 days, call or  return.  Return soon for diabetes f/u and physical  Evola was seen today for vomitting.  Diagnoses and all orders for this visit:  Respiratory tract infection  Nausea and vomiting, intractability of vomiting not specified, unspecified vomiting type  Loose stools  Diabetes mellitus with complication in adult patient [facility])  Essential hypertension  Other orders -     promethazine-dextromethorphan (PROMETHAZINE-DM) 6.25-15 MG/5ML syrup; Take 5 mLs by mouth 4 (four) times daily as needed for cough. -     levofloxacin (LEVAQUIN) 500 MG tablet; Take 1 tablet (500 mg total) by mouth daily.

## 2016-05-28 NOTE — Addendum Note (Signed)
Addended by: Tyrone Apple on: 05/28/2016 12:34 PM   Modules accepted: Orders

## 2016-06-25 ENCOUNTER — Telehealth: Payer: Self-pay | Admitting: Family Medicine

## 2016-06-25 NOTE — Telephone Encounter (Signed)
Pt called for sample of Lantus, we did not have any samples.  Rep log states no more samples coming.  Called pt back and she has coupon to use.

## 2016-06-26 ENCOUNTER — Encounter: Payer: BLUE CROSS/BLUE SHIELD | Admitting: Medical

## 2016-07-11 ENCOUNTER — Telehealth: Payer: Self-pay

## 2016-07-11 NOTE — Telephone Encounter (Signed)
Pt states she needs samples of Lantus. Pt cb # [phone]

## 2016-07-11 NOTE — Telephone Encounter (Signed)
We do have samples of the lantus  , called pt to let her know , she is stopping by to pick this up

## 2016-07-11 NOTE — Telephone Encounter (Signed)
See if we have any?  Is insurance not paying for this, do we need to use alternative?

## 2016-07-12 NOTE — Telephone Encounter (Signed)
Is there an insurance issue?  Do we need to switch to different med due to costs?

## 2016-07-12 NOTE — Telephone Encounter (Signed)
Pt husband loss his job and doesn't have any insurance right now. Just need sample to get her by for this month.

## 2016-09-20 ENCOUNTER — Other Ambulatory Visit: Payer: Self-pay | Admitting: Medical

## 2016-10-22 ENCOUNTER — Telehealth: Payer: Self-pay | Admitting: Medical

## 2016-10-22 NOTE — Telephone Encounter (Signed)
Called Novolog rep & they are no longer sampling Novolog at all, printed out Pt Assistance application for pt.   Left message for pt.

## 2016-10-23 ENCOUNTER — Other Ambulatory Visit: Payer: Self-pay | Admitting: Medical

## 2016-11-21 ENCOUNTER — Telehealth: Payer: Self-pay | Admitting: Medical

## 2016-11-21 NOTE — Telephone Encounter (Signed)
I called pt regarding Pt Assistance program for Novolog, and she would like to apply.  Also she would like one for Lantus.  This was also printed out and will have up front for pt to pick up

## 2016-11-26 ENCOUNTER — Ambulatory Visit: Payer: BLUE CROSS/BLUE SHIELD | Admitting: Medical

## 2016-11-29 ENCOUNTER — Ambulatory Visit (INDEPENDENT_AMBULATORY_CARE_PROVIDER_SITE_OTHER): Payer: Self-pay

## 2016-11-29 ENCOUNTER — Encounter (HOSPITAL_COMMUNITY): Payer: Self-pay | Admitting: Emergency Medicine

## 2016-11-29 ENCOUNTER — Ambulatory Visit (HOSPITAL_COMMUNITY)
Admission: EM | Admit: 2016-11-29 | Discharge: 2016-11-29 | Disposition: A | Discharge status: Home / Self Care | Payer: Self-pay | Attending: Internal Medicine | Admitting: Internal Medicine

## 2016-11-29 DIAGNOSIS — M25521 Pain in right elbow: Secondary | ICD-10-CM

## 2016-11-29 NOTE — ED Provider Notes (Signed)
CSN: [id number]     Arrival date & time 11/29/16  1748 History   None    Chief Complaint  Patient presents with  . Arm Injury   (Consider location/radiation/quality/duration/timing/severity/associated sxs/prior Treatment) 51 year old female presents to clinic with a chief complaint of left arm and elbow pain. She fell on July 4, and landed on a fence post. He had a sharp burning in her arm. She had significant swelling in her arm over the next several days, and pain is continued despite rest, ice, compression, and elevation. She has a notable bulge just proximal to the antecubital space   The history is provided by the patient.    Past Medical History:  Diagnosis Date  . Anemia   . Arthritis    knees  . Bronchitis   . Diabetes mellitus without complication [facility])    age 22yo  . GERD (gastroesophageal reflux disease)   . Glaucoma    Dr. Einar Gip  . H/O mammogram 2005  . Headache   . Heart murmur   . Hyperlipidemia   . Hypertension   . Intermittent palpitations   . Legally blind   . Myopia   . Pneumonia   . Routine gynecological examination    last pap 2005  . Seasonal allergies   . Shortness of breath dyspnea   . Sinusitis   . Thyroid nodule    Past Surgical History:  Procedure Laterality Date  . CYSTECTOMY     umbilicus  . INCISION AND DRAINAGE     abdominal superficial abscess  . LAPAROSCOPIC VAGINAL HYSTERECTOMY WITH SALPINGECTOMY Bilateral 09/13/2015   Procedure: LAPAROSCOPIC ASSISTED VAGINAL HYSTERECTOMY WITH SALPINGECTOMY, McCalls Colpoplasty;  Surgeon: Thurnell Lose, MD;  Location: [facility];  Service: Gynecology;  Laterality: Bilateral;  . LASIK     x2   Family History  Problem Relation Age of Onset  . Diabetes Mother   . Hypertension Mother   . Stroke Mother   . Hypertension Father   . Chronic Renal Failure Father   . Hypertension Sister   . Hypertension Brother   . Stroke Maternal Grandmother   . Asthma Daughter   . Healthy Daughter   . Healthy  Son   . Cancer Neg Hx   . Heart disease Neg Hx    Social History  Substance Use Topics  . Smoking status: Never Smoker  . Smokeless tobacco: Never Used  . Alcohol use No   OB History    No data available     Review of Systems  HENT: Negative.   Respiratory: Negative.   Cardiovascular: Negative.   Gastrointestinal: Negative.   Musculoskeletal: Positive for joint swelling.  Skin: Negative.   Neurological: Negative.     Allergies  Metformin and related and Tanzeum [albiglutide]  Home Medications   Prior to Admission medications   Medication Sig Start Date End Date Taking? Authorizing Provider  amLODipine (NORVASC) 10 MG tablet Take 1 tablet (10 mg total) by mouth daily. 12/05/15  Yes Tysinger, Camelia Eng, PA-C  aspirin EC 81 MG tablet Take 1 tablet (81 mg total) by mouth daily. 12/05/15  Yes Tysinger, Camelia Eng, PA-C  latanoprost (XALATAN) 0.005 % ophthalmic solution Place 1 drop into both eyes at bedtime.   Yes [provider]  lisinopril-hydrochlorothiazide (PRINZIDE,ZESTORETIC) 20-25 MG tablet TAKE 1 TABLET BY MOUTH DAILY 09/20/16  Yes Tysinger, Camelia Eng, PA-C  AMBULATORY NON FORMULARY MEDICATION 1 Units by Other route daily. Lift chair 01/18/16   Narda Amber K, DO  insulin aspart (NOVOLOG  FLEXPEN) 100 UNIT/ML FlexPen Inject 12 Units into the skin 3 (three) times daily with meals. Use sliding scale we provided 12/05/15   Tysinger, Camelia Eng, PA-C  Insulin Glargine (LANTUS) 100 UNIT/ML Solostar Pen Inject 40 Units into the skin daily at 10 pm. 12/05/15   Tysinger, Camelia Eng, PA-C  Insulin Pen Needle 32G X 6 MM MISC Use pen needles for Novolog 12/05/15   Tysinger, Camelia Eng, PA-C  potassium chloride SA (KLOR-CON M20) 20 MEQ tablet Take 1 tablet (20 mEq total) by mouth 2 (two) times daily. Patient not taking: Reported on 05/28/2016 12/05/15   Tysinger, Camelia Eng, PA-C  PRODIGY NO CODING BLOOD GLUC test strip USE TO TEST BLOOD SUGAR TWICE DAILY 10/23/16   Tysinger, Camelia Eng, PA-C  PRODIGY TWIST  TOP LANCETS 28G MISC USE TO TEST BLOOD SUGAR ONCE OR TWICE DAILY 10/23/16   Tysinger, Camelia Eng, PA-C  promethazine-dextromethorphan (PROMETHAZINE-DM) 6.25-15 MG/5ML syrup Take 5 mLs by mouth 4 (four) times daily as needed for cough. 05/28/16   Tysinger, Camelia Eng, PA-C   Meds Ordered and Administered this Visit  Medications - No data to display  BP (!) 184/81 (BP Location: Left Arm)   Pulse 84   Temp 98.9 F (37.2 C) (Oral)   Resp 16   LMP 09/13/2015   SpO2 100%  No data found.   Physical Exam  Constitutional: She is oriented to person, place, and time. She appears well-developed and well-nourished. No distress.  HENT:  Head: Normocephalic and atraumatic.  Right Ear: External ear normal.  Left Ear: External ear normal.  Cardiovascular: Normal rate and regular rhythm.   Pulmonary/Chest: Effort normal and breath sounds normal.  Neurological: She is alert and oriented to person, place, and time.  Skin: Skin is warm and dry. Capillary refill takes less than 2 seconds. No rash noted. She is not diaphoretic. No erythema.  Psychiatric: She has a normal mood and affect. Her behavior is normal.  Nursing note and vitals reviewed.   Urgent Care Course     Procedures (including critical care time)  Labs Review Labs Reviewed - No data to display  Imaging Review Dg Elbow Complete Right  Result Date: 11/29/2016 CLINICAL DATA:  Right elbow pain since the patient tripped over a stump 1 week ago. Initial encounter. EXAM: RIGHT ELBOW - COMPLETE 3+ VIEW COMPARISON:  None. FINDINGS: There is no evidence of fracture, dislocation, or joint effusion. There is no evidence of arthropathy or other focal bone abnormality. Soft tissues are unremarkable. IMPRESSION: Negative exam. Electronically Signed   By: Inge Rise M.D.   On: 11/29/2016 19:32     MDM   1. Right elbow pain    Differential for this injury's extensive, however x-rays negative for any fracture dislocation, based on signs, symptoms,  and physical exam findings, strongly suspicious for distal biceps tendon rupture. Patient declined anti-inflammatories, splinting arm and shoulder, referral made to orthopedics.     Barnet Glasgow, NP 11/29/16 2057

## 2016-11-29 NOTE — Discharge Instructions (Signed)
I strongly suspect you have a biceps tendon rupture, however cannot confirm in her setting due to lack of MRI or ultrasound. Recommend rest, ice, compression and elevation, provided referral to orthopedics, you will need to follow-up with them for further management of this condition.

## 2016-11-29 NOTE — ED Triage Notes (Signed)
Pt reports she fell on 4th of July and inj her right arm/biceps while outside the house  Husband sts she hit a gate  Has been applying ice to the area w/some relief  Pain increases w/activity  A&O x4... NAD... Ambulatory

## 2016-12-10 NOTE — Telephone Encounter (Signed)
Pt Assistance form completed and faxed  

## 2016-12-12 ENCOUNTER — Emergency Department (HOSPITAL_COMMUNITY)
Admission: EM | Admit: 2016-12-12 | Discharge: 2016-12-12 | Disposition: A | Discharge status: Home / Self Care | Payer: BLUE CROSS/BLUE SHIELD | Attending: Emergency Medicine | Admitting: Emergency Medicine

## 2016-12-12 ENCOUNTER — Encounter (HOSPITAL_COMMUNITY): Payer: Self-pay | Admitting: Emergency Medicine

## 2016-12-12 DIAGNOSIS — Z7982 Long term (current) use of aspirin: Secondary | ICD-10-CM | POA: Insufficient documentation

## 2016-12-12 DIAGNOSIS — S46211D Strain of muscle, fascia and tendon of other parts of biceps, right arm, subsequent encounter: Secondary | ICD-10-CM | POA: Insufficient documentation

## 2016-12-12 DIAGNOSIS — S46211A Strain of muscle, fascia and tendon of other parts of biceps, right arm, initial encounter: Secondary | ICD-10-CM

## 2016-12-12 DIAGNOSIS — E119 Type 2 diabetes mellitus without complications: Secondary | ICD-10-CM | POA: Insufficient documentation

## 2016-12-12 DIAGNOSIS — W1830XD Fall on same level, unspecified, subsequent encounter: Secondary | ICD-10-CM | POA: Insufficient documentation

## 2016-12-12 DIAGNOSIS — Z79899 Other long term (current) drug therapy: Secondary | ICD-10-CM | POA: Insufficient documentation

## 2016-12-12 DIAGNOSIS — Z794 Long term (current) use of insulin: Secondary | ICD-10-CM | POA: Insufficient documentation

## 2016-12-12 NOTE — Discharge Instructions (Signed)
Take tylenol 1000mg(2 extra strength) four times a day.  ° °

## 2016-12-12 NOTE — ED Notes (Signed)
Patient was alert, oriented and stable upon discharge. RN went over AVS and patient had no further questions.  

## 2016-12-12 NOTE — ED Triage Notes (Signed)
Pt comes after seeing urgent care on the 13th for arm pain after falling on the 4th of July. UC stated they suspected she had a bicep tendon rupture and needed to follow up with Dr. Amedeo Plenty.  Pt states she does not have insurance until November and could not afford the 260.00 to pay up front.  States she came in because she cannot take the pain any longer. Requesting MRI or [place] per urgent care's suggestion.

## 2016-12-12 NOTE — ED Provider Notes (Signed)
[facility] DEPT Provider Note   CSN: [id number] Arrival date & time: 12/12/16  1330     History   Chief Complaint Chief Complaint  Patient presents with  . Arm Pain    HPI Tina Horton is a 51 y.o. female.  51 yo F with a chief complaint of right arm pain. The patient tripped on the Fourth of July and fell with a fully extended right arm and struck a fence just above the elbow along the medial aspect of the arm. She had some pain and swelling to that arm. She is seen in urgent care couple times for this. Has follow-up with Dr. Amedeo Plenty tomorrow though would not be able to pay for the visit so rescheduled for a month from now.  Patient called urgent care and they suggested that she come here for an MRI.    The history is provided by the patient.  Arm Pain  This is a chronic problem. The current episode started more than 1 week ago. The problem occurs constantly. The problem has been gradually worsening. Pertinent negatives include no chest pain, no headaches and no shortness of breath. The symptoms are aggravated by bending and twisting. Nothing relieves the symptoms. She has tried nothing for the symptoms. The treatment provided no relief.    Past Medical History:  Diagnosis Date  . Anemia   . Arthritis    knees  . Bronchitis   . Diabetes mellitus without complication [facility])    age 64yo  . GERD (gastroesophageal reflux disease)   . Glaucoma    Dr. Einar Gip  . H/O mammogram 2005  . Headache   . Heart murmur   . Hyperlipidemia   . Hypertension   . Intermittent palpitations   . Legally blind   . Myopia   . Pneumonia   . Routine gynecological examination    last pap 2005  . Seasonal allergies   . Shortness of breath dyspnea   . Sinusitis   . Thyroid nodule     Patient Active Problem List   Diagnosis Date Noted  . Renal cyst 01/15/2016  . Hydronephrosis 01/15/2016  . Spinal stenosis of lumbar region 01/15/2016  . Anemia, iron deficiency 12/04/2015  . History  of burning pain in leg 12/04/2015  . Bilateral low back pain without sciatica 12/04/2015  . S/P laparoscopic assisted vaginal hysterectomy (LAVH) 09/13/2015  . Encounter for health maintenance examination in adult 08/21/2015  . Hyperlipidemia 08/21/2015  . Glaucoma 08/21/2015  . Heart murmur 08/21/2015  . Thyroid nodule 08/21/2015  . Vaccine refused by patient 08/21/2015  . Noncompliance 03/21/2015  . Essential hypertension 03/21/2015  . Diabetes mellitus with complication in adult patient ([place]) 03/21/2015  . Lymphadenitis 03/21/2015  . SKIN LESION 10/19/2008  . SYSTOLIC MURMUR 05/22/7251  . DIABETES MELLITUS, II, COMPLICATIONS 66/44/0347  . HYPERCHOLESTEROLEMIA 07/17/2006  . GLAUCOMA 07/17/2006  . VISUAL DISTURBANCE NOS 07/17/2006  . HYPERTENSION, BENIGN SYSTEMIC 07/17/2006  . REFLUX ESOPHAGITIS 07/17/2006    Past Surgical History:  Procedure Laterality Date  . CYSTECTOMY     umbilicus  . INCISION AND DRAINAGE     abdominal superficial abscess  . LAPAROSCOPIC VAGINAL HYSTERECTOMY WITH SALPINGECTOMY Bilateral 09/13/2015   Procedure: LAPAROSCOPIC ASSISTED VAGINAL HYSTERECTOMY WITH SALPINGECTOMY, McCalls Colpoplasty;  Surgeon: Thurnell Lose, MD;  Location: [facility];  Service: Gynecology;  Laterality: Bilateral;  . LASIK     x2    OB History    No data available       Home Medications  Prior to Admission medications   Medication Sig Start Date End Date Taking? Authorizing Provider  AMBULATORY NON FORMULARY MEDICATION 1 Units by Other route daily. Lift chair 01/18/16   Narda Amber K, DO  amLODipine (NORVASC) 10 MG tablet Take 1 tablet (10 mg total) by mouth daily. 12/05/15   Tysinger, Camelia Eng, PA-C  aspirin EC 81 MG tablet Take 1 tablet (81 mg total) by mouth daily. 12/05/15   Tysinger, Camelia Eng, PA-C  insulin aspart (NOVOLOG FLEXPEN) 100 UNIT/ML FlexPen Inject 12 Units into the skin 3 (three) times daily with meals. Use sliding scale we provided 12/05/15   Tysinger, Camelia Eng,  PA-C  Insulin Glargine (LANTUS) 100 UNIT/ML Solostar Pen Inject 40 Units into the skin daily at 10 pm. 12/05/15   Tysinger, Camelia Eng, PA-C  Insulin Pen Needle 32G X 6 MM MISC Use pen needles for Novolog 12/05/15   Tysinger, Camelia Eng, PA-C  latanoprost (XALATAN) 0.005 % ophthalmic solution Place 1 drop into both eyes at bedtime.    [provider]  lisinopril-hydrochlorothiazide (PRINZIDE,ZESTORETIC) 20-25 MG tablet TAKE 1 TABLET BY MOUTH DAILY 09/20/16   Tysinger, Camelia Eng, PA-C  potassium chloride SA (KLOR-CON M20) 20 MEQ tablet Take 1 tablet (20 mEq total) by mouth 2 (two) times daily. Patient not taking: Reported on 05/28/2016 12/05/15   Tysinger, Camelia Eng, PA-C  PRODIGY NO CODING BLOOD GLUC test strip USE TO TEST BLOOD SUGAR TWICE DAILY 10/23/16   Tysinger, Camelia Eng, PA-C  PRODIGY TWIST TOP LANCETS 28G MISC USE TO TEST BLOOD SUGAR ONCE OR TWICE DAILY 10/23/16   Tysinger, Camelia Eng, PA-C  promethazine-dextromethorphan (PROMETHAZINE-DM) 6.25-15 MG/5ML syrup Take 5 mLs by mouth 4 (four) times daily as needed for cough. 05/28/16   Tysinger, Camelia Eng, PA-C    Family History Family History  Problem Relation Age of Onset  . Diabetes Mother   . Hypertension Mother   . Stroke Mother   . Hypertension Father   . Chronic Renal Failure Father   . Hypertension Sister   . Hypertension Brother   . Stroke Maternal Grandmother   . Asthma Daughter   . Healthy Daughter   . Healthy Son   . Cancer Neg Hx   . Heart disease Neg Hx     Social History Social History  Substance Use Topics  . Smoking status: Never Smoker  . Smokeless tobacco: Never Used  . Alcohol use No     Allergies   Metformin and related and Tanzeum [albiglutide]   Review of Systems Review of Systems  Constitutional: Negative for chills and fever.  HENT: Negative for congestion and rhinorrhea.   Eyes: Negative for redness and visual disturbance.  Respiratory: Negative for shortness of breath and wheezing.   Cardiovascular: Negative  for chest pain and palpitations.  Gastrointestinal: Negative for nausea and vomiting.  Genitourinary: Negative for dysuria and urgency.  Musculoskeletal: Positive for arthralgias and myalgias.  Skin: Negative for pallor and wound.  Neurological: Negative for dizziness and headaches.     Physical Exam Updated Vital Signs LMP 09/13/2015   Physical Exam  Constitutional: She is oriented to person, place, and time. She appears well-developed and well-nourished. No distress.  HENT:  Head: Normocephalic and atraumatic.  Eyes: Pupils are equal, round, and reactive to light. EOM are normal.  Neck: Normal range of motion. Neck supple.  Cardiovascular: Normal rate and regular rhythm.  Exam reveals no gallop and no friction rub.   No murmur heard. Pulmonary/Chest: Effort normal. She has no  wheezes. She has no rales.  Abdominal: Soft. She exhibits no distension. There is no tenderness.  Musculoskeletal: She exhibits no edema or tenderness.       Arms: Neurological: She is alert and oriented to person, place, and time.  Skin: Skin is warm and dry. She is not diaphoretic.  Psychiatric: She has a normal mood and affect. Her behavior is normal.  Nursing note and vitals reviewed.    ED Treatments / Results  Labs (all labs ordered are listed, but only abnormal results are displayed) Labs Reviewed - No data to display  EKG  EKG Interpretation None       Radiology No results found.  Procedures Procedures (including critical care time)  Medications Ordered in ED Medications - No data to display   Initial Impression / Assessment and Plan / ED Course  I have reviewed the triage vital signs and the nursing notes.  Pertinent labs & imaging results that were available during my care of the patient were reviewed by me and considered in my medical decision making (see chart for details).     [place] with likely partial biceps tendon tear.  Full ROM, PMS intact distally.  I do not see  an urgent need for MR at this point.  Will have her follow up with Dr Amedeo Plenty in the office.   2:06 PM:  I have discussed the diagnosis/risks/treatment options with the patient and believe the pt to be eligible for discharge home to follow-up with Hand surgery. We also discussed returning to the ED immediately if new or worsening sx occur. We discussed the sx which are most concerning (e.g., sudden worsening pain, fever, inability to tolerate by mouth) that necessitate immediate return. Medications administered to the patient during their visit and any new prescriptions provided to the patient are listed below.  Medications given during this visit Medications - No data to display   The patient appears reasonably screen and/or stabilized for discharge and I doubt any other medical condition or other [facility] requiring further screening, evaluation, or treatment in the ED at this time prior to discharge.    Final Clinical Impressions(s) / ED Diagnoses   Final diagnoses:  Biceps muscle tear, right, initial encounter    New Prescriptions Discharge Medication List as of 12/12/2016  1:57 PM       Deno Etienne, DO 12/12/16 1406

## 2016-12-18 NOTE — Telephone Encounter (Signed)
[place] approved til 12/07/17.  Novolog 56ml 100 unit vials received Also Lantus Solostar pen 2 pen packs received from [employer] received. Called pt & informed

## 2016-12-19 ENCOUNTER — Other Ambulatory Visit: Payer: Self-pay | Admitting: Medical

## 2017-03-27 ENCOUNTER — Encounter: Payer: Self-pay | Admitting: Internal Medicine

## 2017-04-15 ENCOUNTER — Telehealth: Payer: Self-pay | Admitting: Family Medicine

## 2017-04-15 NOTE — Telephone Encounter (Signed)
Called pt as she is past due for diabetic follow up.  She states her husband is trying to get insurance and It may not happen until next Nov.  I asked her what she was doing for her diabetes.  She said she got some free insulin and she is taking care of the diabetes naturally.  She will call when she gets insurance.

## 2017-04-15 NOTE — Telephone Encounter (Signed)
That is not reasonable as far as her diabetic care goes.   Given her contact info for [facility] or refer.   She is due now, and she can't go 1 year without f/u and testing.

## 2017-04-17 NOTE — Telephone Encounter (Signed)
Called pt advised her that we can give her the no insurance discount and maybe samples.  Also offered her the [facility] [phone].  Explained that Tina Horton does not want her to go that long with out care.  She said she will try to get the money together and come in.

## 2017-04-18 ENCOUNTER — Telehealth: Payer: Self-pay | Admitting: Medical

## 2017-04-18 NOTE — Telephone Encounter (Signed)
Please call concerning recall on the amlodipine  She is not taking it and has not been taking it for a while  Provider was advised

## 2017-05-23 LAB — HM DIABETES EYE EXAM

## 2017-05-27 ENCOUNTER — Encounter: Payer: Self-pay | Admitting: Medical

## 2017-05-27 ENCOUNTER — Ambulatory Visit: Payer: BLUE CROSS/BLUE SHIELD | Admitting: Medical

## 2017-05-27 ENCOUNTER — Telehealth: Payer: Self-pay | Admitting: Medical

## 2017-05-27 VITALS — BP 144/86 | HR 101 | Wt 148.6 lb

## 2017-05-27 DIAGNOSIS — Z794 Long term (current) use of insulin: Secondary | ICD-10-CM

## 2017-05-27 DIAGNOSIS — H409 Unspecified glaucoma: Secondary | ICD-10-CM

## 2017-05-27 DIAGNOSIS — IMO0001 Reserved for inherently not codable concepts without codable children: Secondary | ICD-10-CM

## 2017-05-27 DIAGNOSIS — E041 Nontoxic single thyroid nodule: Secondary | ICD-10-CM

## 2017-05-27 DIAGNOSIS — M48061 Spinal stenosis, lumbar region without neurogenic claudication: Secondary | ICD-10-CM

## 2017-05-27 DIAGNOSIS — E119 Type 2 diabetes mellitus without complications: Secondary | ICD-10-CM | POA: Diagnosis not present

## 2017-05-27 DIAGNOSIS — D509 Iron deficiency anemia, unspecified: Secondary | ICD-10-CM

## 2017-05-27 DIAGNOSIS — I889 Nonspecific lymphadenitis, unspecified: Secondary | ICD-10-CM | POA: Diagnosis not present

## 2017-05-27 DIAGNOSIS — E118 Type 2 diabetes mellitus with unspecified complications: Secondary | ICD-10-CM

## 2017-05-27 DIAGNOSIS — I10 Essential (primary) hypertension: Secondary | ICD-10-CM | POA: Diagnosis not present

## 2017-05-27 DIAGNOSIS — Z2820 Immunization not carried out because of patient decision for unspecified reason: Secondary | ICD-10-CM | POA: Diagnosis not present

## 2017-05-27 DIAGNOSIS — E7800 Pure hypercholesterolemia, unspecified: Secondary | ICD-10-CM | POA: Diagnosis not present

## 2017-05-27 DIAGNOSIS — N281 Cyst of kidney, acquired: Secondary | ICD-10-CM | POA: Diagnosis not present

## 2017-05-27 DIAGNOSIS — Z9119 Patient's noncompliance with other medical treatment and regimen: Secondary | ICD-10-CM

## 2017-05-27 DIAGNOSIS — Z91199 Patient's noncompliance with other medical treatment and regimen due to unspecified reason: Secondary | ICD-10-CM

## 2017-05-27 LAB — POCT GLYCOSYLATED HEMOGLOBIN (HGB A1C): Hemoglobin A1C: 12

## 2017-05-27 MED ORDER — INSULIN GLARGINE 100 UNIT/ML SOLOSTAR PEN: 40.0000 [IU] | PEN_INJECTOR | Freq: Every day | SUBCUTANEOUS | 5 refills | Status: DC

## 2017-05-27 MED ORDER — INSULIN ASPART 100 UNIT/ML FLEXPEN: 12.0000 [IU] | PEN_INJECTOR | Freq: Three times a day (TID) | SUBCUTANEOUS | 5 refills | Status: DC

## 2017-05-27 MED ORDER — INSULIN PEN NEEDLE 32G X 6 MM MISC: 5 refills | Status: DC

## 2017-05-27 NOTE — Patient Instructions (Signed)
Encounter Diagnoses  Name Primary?  [name] HYPERTENSION, BENIGN SYSTEMIC Yes  . Diabetes mellitus with complication in adult patient ([place])   . Thyroid nodule   . Lymphadenitis   . Renal cyst   . Iron deficiency anemia, unspecified iron deficiency anemia type   . Glaucoma, unspecified glaucoma type, unspecified laterality   . HYPERCHOLESTEROLEMIA   . Noncompliance   . Vaccine refused by patient   . Spinal stenosis of lumbar region, unspecified whether neurogenic claudication present   . Insulin dependent diabetes mellitus ([place])    Recommendations:  I understand your concerns and frustrations  I do not blame you for your concerns and frustrations  Please understand that when diabetes and high blood pressure is not controlled, you will eventually start to see additional complications of those disease processes  You already have evidence of this with glaucoma, poorly controlled glucose, markers and your lab work showing early kidney damage  I strongly recommend you eat a strict Mediterranean type diet which means eating mainly fruits, vegetables, and whole grains.  This means eliminating all animal products including dairy, meat, cheese, butter, pork, beef, chicken, [place], and anything made with animal products  This also means drinking only water or unsweetened tea or coffee, with coffee and tea being in moderation  This also means eating fruits and whole grains in moderation or in reasonable portions for a diabetic  I recommend you exercise 40-60 minutes most days a week such as walking  I recommend you check your blood sugar fasting in the morning and before meals and write these numbers down so you can monitor them closely  The goal was to have a blood sugar between 70 and 130 fasting.  Your hemoglobin A1c today was 12% which means her blood sugars are currently averaging close to 300 which is way too high and predispose you to complications of diabetes  Your blood pressure is also  not at goal today  I did refill your insulin since you are agreeable to this  I strongly recommend you have an appointment with your pharmacist to discuss your concerns, lack of trust with them in the pharmaceutical industry, so you can find ways to get reassurance that what you are taking is safe  I recommend you write or contact your congressman about your concerns with your medications as well  I would like for you to get me blood sugar readings and blood pressure readings in 3-4 weeks to get an update on where your numbers are running  I do recommend you restart your Lantus nighttime insulin back at 40 units nightly  I recommend you start back on the NovoLog mealtime insulin with 8 units each mealtime as a starting point, but you can also use the sliding scale below  Correction Insulin/Sliding Scale Your caregiver has decided you need insulin at home. You have been given a correctional scale (sliding scale) in case you need extra insulin when your blood sugar is too high (hyperglycemia). The following instructions will assist you in how to use that correctional scale.  WHAT IS A CORRECTIONAL SCALE (SLIDING SCALE)?  When you check your blood sugar, sometimes it will be higher than your caregiver wants it to be. You may need an extra dose of insulin to bring your blood sugar to your desired level (also known as your goal, target level, or normal level.) The correctional scale is prescribed by your caregiver based on your specific needs.   ______________________________________________________________________  INSULIN SLIDING SCALE   Use  the chart below to determine the amount of your Novolog Insulin that you will use to control your meal time blood sugar.  If your glucose before meal is less than 60, drink 4 oz of orange juice or if able, eat a piece of candy and do not use the meal time dose of insulin  If your glucose before meal is 60 -100, don't use the meal time insulin for  this meal If your glucose before meal is 101-150, use  6 units of Insulin  If your glucose before meal is 151-200, use  8  units of Insulin  If your glucose before meal is 201-250, use  10  units of Insulin If your glucose before meal is 251-300, use  12  units of Insulin If your glucose before meal is 301-350, use  14  units of Insulin If your glucose before meal is 351-400, use  16  units of Insulin If your glucose before meal is 451-500, use  18  units of Insulin If your glucose before meal is >500, use 20 units of Insulin and call doctor immediately  ________________________________________________________________________    WHY IS IT IMPORTANT TO KEEP YOUR BLOOD SUGAR LEVELS AT YOUR DESIRED LEVEL?  It helps to prevent long-term complications of diabetes, such as eye disease, kidney failure, and other serious complications. WHAT TYPE OF INSULIN WILL YOU USE?  To help bring down blood sugars that are too high, your caregiver has prescribed a short-acting or a rapid-acting insulin. An example of a short-acting insulin would be Regular.  WHAT DO I NEED TO DO?   Check your blood sugar with your home blood glucose meter as recommended by your caregiver.  Using your correctional scale, find the range your blood sugar lies in.  Look for the units of insulin that matches the blood sugar range. Give yourself the dose of correctional insulin your caregiver has prescribed. Always make sure you are using the right type of insulin.  Prior to the injection make sure you have food available that you can eat in the next 15 to 30 minutes.  If your correctional insulin is rapid acting, start eating your meal within 15 minutes after you have given yourself the insulin injection. If you wait longer than 15 minutes to eat, your blood sugar might get too low.  If your correctional insulin is short acting (Regular), start eating your meal within 30 minutes after you have given yourself the insulin  injection. If you wait longer than 30 minutes to eat, your blood sugar might get too low. Symptoms of low blood sugar (hypoglycemia) may include feeling shaky or weak, sweating a lot, not thinking straight, difficulty seeing, agitation, or crankiness. Check your blood sugar immediately and treat your results as directed by your caregiver.  Keep a log of your blood sugar results with the time you took the test and the amount of insulin that you injected. This information will help your caregiver manage your medications.  Note on your log anything that may affect your blood sugars such as:  Changes in normal exercise or activity.  Changes in your normal schedule, such as staying up late, going on vacation, changing your diet, or holidays.  New medications. This includes all medications. Some medications, even those that do not require a prescription, may cause high blood sugars.  Illness or stress.  Changes in when you actually took your medication.  Changes in your meals, such as skipping a meal, a late meal, or dining out.  Eating things that may affect blood glucose, such as snacks, larger meal portions than normal, or drinks with sugar.  Ask your caregiver any questions you have.     Complementary and Alternative Medical Therapies for Diabetes Complementary and alternative medical therapies are treatments that are different from typical medical treatments (Western treatments). "Complementary" means that the therapy is used with Western treatments. "Alternative" means that the therapy is used instead of Western treatments. Are these therapies safe? Some of these therapies are usually safe. Others may be harmful. Often, there is not enough research to show how safe or effective a therapy is. If you want to try a complementary or alternative therapy, talk with your health care provider to make sure it is safe. What alternative or complementary therapies are used to treat  diabetes? Acupuncture Acupuncture is the insertion of needles into certain places on the skin. This is done by a professional. It is often used to relieve long-term (chronic) pain, especially of the bones and joints. It may help you if you have painful nerve damage. Biofeedback Biofeedback helps you to become more aware of your body's response to pain. It also helps you to learn ways of dealing with pain. Biofeedback is about relaxing and reducing stress. An example of a biofeedback technique is guided imagery. This involves creating peaceful images in your mind. Chromium Chromium is a substance that can help improve how insulin works in the body. Chromium is in many foods, including whole grains, nuts, and egg yolks. Chromium may also be taken as a supplement. Taking chromium supplements may help to control diabetes, especially if you have a lack of chromium (deficiency) in your body. However, research has not proven this. If you have kidney problems, you should be careful with chromium supplements. American ginseng American ginseng is an herb that may lower glucose levels. It may also help lower A1C levels. More research is needed before recommendations for ginseng use can be made. Magnesium Magnesium is a mineral found in many foods, such as whole grains, nuts, and green leafy vegetables. Having low magnesium levels may make controlling blood glucose more difficult for people who have type 2 diabetes. Low magnesium levels may also contribute to certain diabetes complications. Getting more magnesium and eating a high-fiber diet may reduce the risk of developing type 2 diabetes. Vanadium Vanadium is a compound found in small amounts in certain plants and animals. Some studies show that it improves glucose levels in animals with diabetes. In one study, people with diabetes were able to lower their insulin dosage when taking vanadium. More research about side effects and safe dosage levels is  needed. Cinnamon Cinnamon may decrease insulin resistance, increase insulin production, and lower blood glucose levels. It may work best when used with diabetes medicines. Fenugreek Fenugreek is an herb whose seeds are often used in cooking. It may help lower blood glucose by decreasing carbohydrate absorption and increasing insulin production. Summary  Talk with your health care provider about complementary or alternative therapy for you. Some therapies may be appropriate for you, but others may cause side effects.  Follow your diabetes care plan as prescribed. This information is not intended to replace advice given to you by your health care provider. Make sure you discuss any questions you have with your health care provider. Document Released: 03/03/2007 Document Revised: 05/22/2016 Document Reviewed: 05/22/2016 Elsevier Interactive Patient Education  2017 [place] DASH stands for "Dietary Approaches to Stop Hypertension." The DASH eating plan  is a healthy eating plan that has been shown to reduce high blood pressure (hypertension). Additional health benefits may include reducing the risk of type 2 diabetes mellitus, heart disease, and stroke. The DASH eating plan may also help with weight loss. WHAT DO I NEED TO KNOW ABOUT THE DASH EATING PLAN? For the DASH eating plan, you will follow these general guidelines:  Choose foods with a percent daily value for sodium of less than 5% (as listed on the food label).  Use salt-free seasonings or herbs instead of table salt or sea salt.  Check with your health care provider or pharmacist before using salt substitutes.  Eat lower-sodium products, often labeled as "lower sodium" or "no salt added."  Eat fresh foods.  Eat more vegetables, fruits, and low-fat dairy products.  Choose whole grains. Look for the word "whole" as the first word in the ingredient list.  Choose fish and skinless chicken or [place] more  often than red meat. Limit fish, poultry, and meat to 6 oz (170 g) each day.  Limit sweets, desserts, sugars, and sugary drinks.  Choose heart-healthy fats.  Limit cheese to 1 oz (28 g) per day.  Eat more home-cooked food and less restaurant, buffet, and fast food.  Limit fried foods.  Cook foods using methods other than frying.  Limit canned vegetables. If you do use them, rinse them well to decrease the sodium.  When eating at a restaurant, ask that your food be prepared with less salt, or no salt if possible. WHAT FOODS CAN I EAT? Seek help from a dietitian for individual calorie needs. Grains Whole grain or whole wheat bread. Brown rice. Whole grain or whole wheat pasta. Quinoa, bulgur, and whole grain cereals. Low-sodium cereals. Corn or whole wheat flour tortillas. Whole grain cornbread. Whole grain crackers. Low-sodium crackers. Vegetables Fresh or frozen vegetables (raw, steamed, roasted, or grilled). Low-sodium or reduced-sodium tomato and vegetable juices. Low-sodium or reduced-sodium tomato sauce and paste. Low-sodium or reduced-sodium canned vegetables.  Fruits All fresh, canned (in natural juice), or frozen fruits. Meat and Other Protein Products Ground beef (85% or leaner), grass-fed beef, or beef trimmed of fat. Skinless chicken or [place]. Ground chicken or [place]. Pork trimmed of fat. All fish and seafood. Eggs. Dried beans, peas, or lentils. Unsalted nuts and seeds. Unsalted canned beans. Dairy Low-fat dairy products, such as skim or 1% milk, 2% or reduced-fat cheeses, low-fat ricotta or cottage cheese, or plain low-fat yogurt. Low-sodium or reduced-sodium cheeses. Fats and Oils Tub margarines without trans fats. Light or reduced-fat mayonnaise and salad dressings (reduced sodium). Avocado. Safflower, olive, or canola oils. Natural peanut or almond butter. Other Unsalted popcorn and pretzels. The items listed above may not be a complete list of recommended foods or  beverages. Contact your dietitian for more options. WHAT FOODS ARE NOT RECOMMENDED? Grains White bread. White pasta. White rice. Refined cornbread. Bagels and croissants. Crackers that contain trans fat. Vegetables Creamed or fried vegetables. Vegetables in a cheese sauce. Regular canned vegetables. Regular canned tomato sauce and paste. Regular tomato and vegetable juices. Fruits Dried fruits. Canned fruit in light or heavy syrup. Fruit juice. Meat and Other Protein Products Fatty cuts of meat. Ribs, chicken wings, bacon, sausage, bologna, salami, chitterlings, fatback, hot dogs, bratwurst, and packaged luncheon meats. Salted nuts and seeds. Canned beans with salt. Dairy Whole or 2% milk, cream, half-and-half, and cream cheese. Whole-fat or sweetened yogurt. Full-fat cheeses or blue cheese. Nondairy creamers and whipped toppings. Processed cheese, cheese spreads, or  cheese curds. Condiments Onion and garlic salt, seasoned salt, table salt, and sea salt. Canned and packaged gravies. Worcestershire sauce. Tartar sauce. Barbecue sauce. Teriyaki sauce. Soy sauce, including reduced sodium. Steak sauce. Fish sauce. Oyster sauce. Cocktail sauce. Horseradish. Ketchup and mustard. Meat flavorings and tenderizers. Bouillon cubes. Hot sauce. Tabasco sauce. Marinades. Taco seasonings. Relishes. Fats and Oils Butter, stick margarine, lard, shortening, ghee, and bacon fat. Coconut, palm kernel, or palm oils. Regular salad dressings. Other Pickles and olives. Salted popcorn and pretzels. The items listed above may not be a complete list of foods and beverages to avoid. Contact your dietitian for more information. WHERE CAN I FIND MORE INFORMATION? National Heart, Lung, and Blood Institute: travelstabloid.com Document Released: 04/25/2011 Document Revised: 09/20/2013 Document Reviewed: 03/10/2013 [facility] Patient Information 2015 [place], [place]. This information is not  intended to replace advice given to you by your health care provider. Make sure you discuss any questions you have with your health care provider.        Why follow it? Research shows. . Those who follow the Mediterranean diet have a reduced risk of heart disease  . The diet is associated with a reduced incidence of Parkinson's and Alzheimer's diseases . People following the diet may have longer life expectancies and lower rates of chronic diseases  . The Dietary Guidelines for Americans recommends the Mediterranean diet as an eating plan to promote health and prevent disease  What Is the Mediterranean Diet?  . Healthy eating plan based on typical foods and recipes of Mediterranean-style cooking . The diet is primarily a plant based diet; these foods should make up a majority of meals   Starches - Plant based foods should make up a majority of meals - They are an important sources of vitamins, minerals, energy, antioxidants, and fiber - Choose whole grains, foods high in fiber and minimally processed items  - Typical grain sources include wheat, oats, barley, corn, brown rice, bulgar, farro, millet, polenta, couscous  - Various types of beans include chickpeas, lentils, fava beans, black beans, white beans   Fruits  Veggies - Large quantities of antioxidant rich fruits & veggies; 6 or more servings  - Vegetables can be eaten raw or lightly drizzled with oil and cooked  - Vegetables common to the traditional Mediterranean Diet include: artichokes, arugula, beets, broccoli, brussel sprouts, cabbage, carrots, celery, collard greens, cucumbers, eggplant, kale, leeks, lemons, lettuce, mushrooms, okra, onions, peas, peppers, potatoes, pumpkin, radishes, rutabaga, shallots, spinach, sweet potatoes, turnips, zucchini - Fruits common to the Mediterranean Diet include: apples, apricots, avocados, cherries, clementines, dates, figs, grapefruits, grapes, melons, nectarines, oranges, peaches, pears,  pomegranates, strawberries, tangerines  Fats - Replace butter and margarine with healthy oils, such as olive oil, canola oil, and tahini  - Limit nuts to no more than a handful a day  - Nuts include walnuts, almonds, pecans, pistachios, pine nuts  - Limit or avoid candied, honey roasted or heavily salted nuts - Olives are central to the [employer] - can be eaten whole or used in a variety of dishes   Meats Protein - Limiting red meat: no more than a few times a month - When eating red meat: choose lean cuts and keep the portion to the size of deck of cards - Eggs: approx. 0 to 4 times a week  - Fish and lean poultry: at least 2 a week  - Healthy protein sources include, chicken, [place], lean beef, lamb - Increase intake of seafood such as tuna, salmon,  trout, mackerel, shrimp, scallops - Avoid or limit high fat processed meats such as sausage and bacon  Dairy - Include moderate amounts of low fat dairy products  - Focus on healthy dairy such as fat free yogurt, skim milk, low or reduced fat cheese - Limit dairy products higher in fat such as whole or 2% milk, cheese, ice cream  Alcohol - Moderate amounts of red wine is ok  - No more than 5 oz daily for women (all ages) and men older than age 31  - No more than 10 oz of wine daily for men younger than 1  Other - Limit sweets and other desserts  - Use herbs and spices instead of salt to flavor foods  - Herbs and spices common to the traditional Mediterranean Diet include: basil, bay leaves, chives, cloves, cumin, fennel, garlic, lavender, marjoram, mint, oregano, parsley, pepper, rosemary, sage, savory, sumac, tarragon, thyme   It's not just a diet, it's a lifestyle:  . The Mediterranean diet includes lifestyle factors typical of those in the region  . Foods, drinks and meals are best eaten with others and savored . Daily physical activity is important for overall good health . This could be strenuous exercise like running and  aerobics . This could also be more leisurely activities such as walking, housework, yard-work, or taking the stairs . Moderation is the key; a balanced and healthy diet accommodates most foods and drinks . Consider portion sizes and frequency of consumption of certain foods   Meal Ideas & Options:  . Breakfast:  o Whole wheat toast or whole wheat English muffins with peanut butter & hard boiled egg o Steel cut oats topped with apples & cinnamon and skim milk  o Fresh fruit: banana, strawberries, melon, berries, peaches  o Smoothies: strawberries, bananas, greek yogurt, peanut butter o Low fat greek yogurt with blueberries and granola  o Egg white omelet with spinach and mushrooms o Breakfast couscous: whole wheat couscous, apricots, skim milk, cranberries  . Sandwiches:  o Hummus and grilled vegetables (peppers, zucchini, squash) on whole wheat bread   o Grilled chicken on whole wheat pita with lettuce, tomatoes, cucumbers or tzatziki  o Tuna salad on whole wheat bread: tuna salad made with greek yogurt, olives, red peppers, capers, green onions o Garlic rosemary lamb pita: lamb sauted with garlic, rosemary, salt & pepper; add lettuce, cucumber, greek yogurt to pita - flavor with lemon juice and black pepper  . Seafood:  o Mediterranean grilled salmon, seasoned with garlic, basil, parsley, lemon juice and black pepper o Shrimp, lemon, and spinach whole-grain pasta salad made with low fat greek yogurt  o Seared scallops with lemon orzo  o Seared tuna steaks seasoned salt, pepper, coriander topped with tomato mixture of olives, tomatoes, olive oil, minced garlic, parsley, green onions and cappers  . Meats:  o Herbed greek chicken salad with kalamata olives, cucumber, feta  o Red bell peppers stuffed with spinach, bulgur, lean ground beef (or lentils) & topped with feta   o Kebabs: skewers of chicken, tomatoes, onions, zucchini, squash  o [place] burgers: made with red onions, mint, dill,  lemon juice, feta cheese topped with roasted red peppers . Vegetarian o Cucumber salad: cucumbers, artichoke hearts, celery, red onion, feta cheese, tossed in olive oil & lemon juice  o Hummus and whole grain pita points with a greek salad (lettuce, tomato, feta, olives, cucumbers, red onion) o Lentil soup with celery, carrots made with vegetable broth, garlic, salt and  pepper  o Tabouli salad: parsley, bulgur, mint, scallions, cucumbers, tomato, radishes, lemon juice, olive oil, salt and pepper.

## 2017-05-27 NOTE — Telephone Encounter (Signed)
Spoke with Pt about Pt Assistance and she states she did get the vials Lantus but was unable to use them, now has insurance again and will let me know if she has problems affording her medicines

## 2017-05-27 NOTE — Progress Notes (Addendum)
Subjective: Chief Complaint  Patient presents with  . Diabetes    dm check , and shoulder burning pain on right    Here for f/u on diabetes.  She had called in 04/17/17 saying she lost insurance and couldn't come back in for f/u for a year.  We at that point encouraged her to establish with [facility].   Husband had injury on job with prior company, ended up losing that job.  So they missed 1 payment on insurance and was dropped.  They finally have gotten some new insurance.  Per this past year's med check visits...  HTN - is suppose to be taking Amlodipine 10mg , Lisinopril HCT 20/25mg  as well as Potassium 20 BID.  Diabetes - is suppose to be taking Novolog meal time and Lantus 40u QHS insulin.    Hyperlipidemia - suppose to be taking Pravachol 20mg  daily  She is on disability.    Of note, before I could really review her chart, she goes into a 35 minute diatribe about frustration and lack of trust with medications, pharmaceutical companies, media, and news reports.   She is fed up with medications this year being recalled that could cause cancer, with lack of consistency in news media about medications.  She reports that she is going to cure her diabetes and high blood pressure naturally with healthy diet, exercise, with essential oils and herbs.   She insists she is not going to take medications that are prescription other than insulin and her eye drops from her eye doctor.   She is willing to go back on pen insulins but didn't feel right with the vial insulin that she was getting free through patient assistant program for a while during the time she had no insurance.   She refuses referral to dietician, endocrinology or diabetes educator.  She states diabetes is a symptom not a disease and she has to rid her body of the inflammation.  She is wanting to work on a natural approach.   She notes that part of her healthy issues is her poor diet choices over the years, but  can fix them as well.   No other aggravating or relieving factors. No other complaint.   Past Medical History:  Diagnosis Date  . Anemia   . Arthritis    knees  . Bronchitis   . Diabetes mellitus without complication [facility])    age 52yo  . GERD (gastroesophageal reflux disease)   . Glaucoma    Dr. Einar Gip  . H/O mammogram 2005  . Headache   . Heart murmur   . Hyperlipidemia   . Hypertension   . Intermittent palpitations   . Legally blind   . Myopia   . Pneumonia   . Routine gynecological examination    last pap 2005  . Seasonal allergies   . Shortness of breath dyspnea   . Sinusitis   . Thyroid nodule    Current Outpatient Medications on File Prior to Visit  Medication Sig Dispense Refill  . aspirin EC 81 MG tablet Take 1 tablet (81 mg total) by mouth daily. 90 tablet 3  . latanoprost (XALATAN) 0.005 % ophthalmic solution Place 1 drop into both eyes at bedtime.    . AMBULATORY NON FORMULARY MEDICATION 1 Units by Other route daily. Lift chair 1 Units 0  . amLODipine (NORVASC) 10 MG tablet Take 1 tablet (10 mg total) by mouth daily. (Patient not taking: Reported on 05/27/2017) 90 tablet 3  .  lisinopril-hydrochlorothiazide (PRINZIDE,ZESTORETIC) 20-25 MG tablet TAKE 1 TABLET BY MOUTH DAILY (Patient not taking: Reported on 05/27/2017) 90 tablet 2  . PRODIGY NO CODING BLOOD GLUC test strip USE TO TEST BLOOD SUGAR TWICE DAILY (Patient not taking: Reported on 05/27/2017) 100 each 4  . PRODIGY TWIST TOP LANCETS 28G MISC USE TO TEST BLOOD SUGAR ONCE OR TWICE DAILY (Patient not taking: Reported on 05/27/2017) 100 each 4   No current facility-administered medications on file prior to visit.    ROS as in subjective   Objective: BP (!) 144/86   Pulse (!) 101   Wt 148 lb 9.6 oz (67.4 kg)   LMP 09/13/2015   SpO2 98%   BMI 28.08 kg/m   Wt Readings from Last 3 Encounters:  05/27/17 148 lb 9.6 oz (67.4 kg)  05/28/16 155 lb 3.2 oz (70.4 kg)  01/15/16 151 lb (68.5 kg)    BP Readings  from Last 3 Encounters:  05/27/17 (!) 144/86  11/29/16 (!) 184/81  05/28/16 132/68   Gen: wd, wn, nad Psych: seems firm today, pleasant, but serious, answering my questions, but very adamant about her opinions today    Assessment: Encounter Diagnoses  Name Primary?  [name] HYPERTENSION, BENIGN SYSTEMIC Yes  . Diabetes mellitus with complication in adult patient ([place])   . Thyroid nodule   . Lymphadenitis   . Iron deficiency anemia, unspecified iron deficiency anemia type   . Glaucoma, unspecified glaucoma type, unspecified laterality   . HYPERCHOLESTEROLEMIA   . Noncompliance   . Vaccine refused by patient   . Spinal stenosis of lumbar region, unspecified whether neurogenic claudication present   . Insulin dependent diabetes mellitus ([place])      Plan: The visit today was complicated.  It was hard to get a word in, as she had very strong opinions today on how she was going to direct her healthcare including that she was going to "cure" her diabetes with diet, essential goals and herbs.  She is very frustrated with the drug industry, pharmacy, news outlets and  Misinformation.  She did not seem open to any suggestions I had today.  I made absolutely sure today that she was aware of the diagnoses that she has including uncontrolled diabetes and uncontrolled high blood pressure, glaucoma, that she already has complications from these disease processes, and that over time even the best of situations diabetes gets worse.  We talked for a long while about drug options, drug safety, having a conversation with her pharmacist about the recent news of her recalls.  Bottom line is that she has made up her mind today that she will only take insulin and no other prescription medications, and plans to work on things more naturally.  I tried to redirect some of her falsehoods and misunderstanding, but also expressed empathy for her frustration and concerns.    She will be seeing her eye doctor soon  I  strongly recommended we refer her to an endocrinologist and diabetes educator but she declines.  She states that she knows what she has to do and will do it.  I spent additional time trying to review diet and exercise recommendations.  We will start back on an insulin regimen similar to what she was on prior before she ran out of medication  Recommendations:  I understand your concerns and frustrations  I do not blame you for your concerns and frustrations  Please understand that when diabetes and high blood pressure is not controlled, you will eventually start  to see additional complications of those disease processes  You already have evidence of this with glaucoma, poorly controlled glucose, markers and your lab work showing early kidney damage  I strongly recommend you eat a strict Mediterranean type diet which means eating mainly fruits, vegetables, and whole grains.  This means eliminating all animal products including dairy, meat, cheese, butter, pork, beef, chicken, [place], and anything made with animal products  This also means drinking only water or unsweetened tea or coffee, with coffee and tea being in moderation  This also means eating fruits and whole grains in moderation or in reasonable portions for a diabetic  I recommend you exercise 40-60 minutes most days a week such as walking  I recommend you check your blood sugar fasting in the morning and before meals and write these numbers down so you can monitor them closely  The goal was to have a blood sugar between 70 and 130 fasting.  Your hemoglobin A1c today was 12% which means her blood sugars are currently averaging close to 300 which is way too high and predispose you to complications of diabetes  Your blood pressure is also not at goal today  I did refill your insulin since you are agreeable to this  I strongly recommend you have an appointment with your pharmacist to discuss your concerns, lack of trust with  them in the pharmaceutical industry, so you can find ways to get reassurance that what you are taking is safe  I recommend you write or contact your congressman about your concerns with your medications as well  I would like for you to get me blood sugar readings and blood pressure readings in 3-4 weeks to get an update on where your numbers are running  I do recommend you restart your Lantus nighttime insulin back at 40 units nightly  I recommend you start back on the NovoLog mealtime insulin with 8 units each mealtime as a starting point, but you can also use the sliding scale below  Correction Insulin/Sliding Scale Your caregiver has decided you need insulin at home. You have been given a correctional scale (sliding scale) in case you need extra insulin when your blood sugar is too high (hyperglycemia). The following instructions will assist you in how to use that correctional scale.  WHAT IS A CORRECTIONAL SCALE (SLIDING SCALE)?  When you check your blood sugar, sometimes it will be higher than your caregiver wants it to be. You may need an extra dose of insulin to bring your blood sugar to your desired level (also known as your goal, target level, or normal level.) The correctional scale is prescribed by your caregiver based on your specific needs.   ______________________________________________________________________  INSULIN SLIDING SCALE   Use the chart below to determine the amount of your Novolog Insulin that you will use to control your meal time blood sugar.  If your glucose before meal is less than 60, drink 4 oz of orange juice or if able, eat a piece of candy and do not use the meal time dose of insulin  If your glucose before meal is 60 -100, don't use the meal time insulin for this meal If your glucose before meal is 101-150, use  6 units of Insulin  If your glucose before meal is 151-200, use  8  units of Insulin  If your glucose before meal is 201-250, use  10   units of Insulin If your glucose before meal is 251-300, use  12  units  of Insulin If your glucose before meal is 301-350, use  14  units of Insulin If your glucose before meal is 351-400, use  16  units of Insulin If your glucose before meal is 451-500, use  18  units of Insulin If your glucose before meal is >500, use 20 units of Insulin and call doctor immediately   Anagha was seen today for diabetes.  Diagnoses and all orders for this visit:  HYPERTENSION, BENIGN SYSTEMIC  Diabetes mellitus with complication in adult patient ([place]) -     HgB A1c  Thyroid nodule  Lymphadenitis  Iron deficiency anemia, unspecified iron deficiency anemia type  Glaucoma, unspecified glaucoma type, unspecified laterality  HYPERCHOLESTEROLEMIA  Noncompliance  Vaccine refused by patient  Spinal stenosis of lumbar region, unspecified whether neurogenic claudication present  Insulin dependent diabetes mellitus ([place])  Other orders -     Insulin Glargine (LANTUS) 100 UNIT/ML Solostar Pen; Inject 40 Units into the skin daily at 10 pm. -     insulin aspart (NOVOLOG FLEXPEN) 100 UNIT/ML FlexPen; Inject 12 Units into the skin 3 (three) times daily with meals. Use sliding scale we provided -     Discontinue: Insulin Pen Needle (NOVOFINE) 32G X 6 MM MISC; USE PEN NEEDLES FOR LANTUS SOLORSTAR PEN  Spent > 45 minutes face to face with patient in discussion of symptoms, evaluation, plan and recommendations.

## 2017-05-28 ENCOUNTER — Telehealth: Payer: Self-pay | Admitting: Medical

## 2017-05-28 NOTE — Telephone Encounter (Signed)
Forwarding to shane 

## 2017-05-28 NOTE — Telephone Encounter (Signed)
Pt asked that Renal cyst be removed from her record as a dx. She states that it was determined that she does not have a renal cyst or a kidney stone so she wants renal cyst removed from record as a dx and she wants to make sure that kidney stone is not anywhere in her record. Pt was looking at her AVS.

## 2017-05-29 ENCOUNTER — Other Ambulatory Visit: Payer: Self-pay

## 2017-05-29 MED ORDER — INSULIN PEN NEEDLE 32G X 6 MM MISC: 5 refills | Status: DC

## 2017-05-29 NOTE — Telephone Encounter (Signed)
First of all, I don't see any mention of kidney stone.  Secondly, her 2017 CT scan did in fact show renal cyst.  The cyst may or may not be gone now.   Either way, I can't remove a prior record.   I did remove renal cyst from this weeks visit diagnoses.  In general, when someone has a renal cyst, we generally will either repeat scan in a year or have urology decide how they want to recheck on this.     Let her know that I need f/u in a month with sugar readings.

## 2017-05-30 NOTE — Telephone Encounter (Signed)
Called  Spoke with pt explain this to her

## 2017-06-20 ENCOUNTER — Telehealth: Payer: Self-pay

## 2017-06-20 NOTE — Telephone Encounter (Signed)
Called in pt script for the [employer]. Qty 3 with zero refills . Thanks [employer]

## 2017-06-20 NOTE — Telephone Encounter (Signed)
rx sent in to the pharmacy

## 2017-06-20 NOTE — Telephone Encounter (Signed)
Pt pharmacy is requesting a freestyle libre 10 day sensor . Qty 3 with zero refills. Please advise if this is ok to call in. Thanks [employer]

## 2017-06-20 NOTE — Telephone Encounter (Signed)
pls call in the glucometer and testing supplies

## 2017-06-25 ENCOUNTER — Encounter: Payer: Self-pay | Admitting: Family Medicine

## 2017-06-25 ENCOUNTER — Telehealth: Payer: Self-pay | Admitting: Family Medicine

## 2017-06-25 NOTE — Telephone Encounter (Signed)
Pt called with concerns of her visit and felt her CMA was not kind to her.  Pt was also upset that we have documented in her chart that she has a cyst on her kidneys and wanted that removed.  Discussed with Audelia Acton and he advised that she would need to have another scan in a year from the last or have urology decide how they want to recheck this.  Sent letter to patient advising same and advise she needs follow up appointment.

## 2017-07-09 ENCOUNTER — Telehealth: Payer: Self-pay

## 2017-07-09 NOTE — Telephone Encounter (Signed)
Pharmacy called to let us know that pt is using her needles for lantus and novolog so she would need an increase in her script. This was called in on pt behalf. [place]

## 2017-07-24 ENCOUNTER — Other Ambulatory Visit: Payer: Self-pay | Admitting: Medical

## 2017-08-16 ENCOUNTER — Ambulatory Visit (HOSPITAL_COMMUNITY)
Admission: EM | Admit: 2017-08-16 | Discharge: 2017-08-16 | Disposition: A | Discharge status: Home / Self Care | Payer: BLUE CROSS/BLUE SHIELD | Attending: Radiology | Admitting: Radiology

## 2017-08-16 ENCOUNTER — Encounter (HOSPITAL_COMMUNITY): Payer: Self-pay | Admitting: Emergency Medicine

## 2017-08-16 ENCOUNTER — Other Ambulatory Visit: Payer: Self-pay

## 2017-08-16 DIAGNOSIS — M5432 Sciatica, left side: Secondary | ICD-10-CM

## 2017-08-16 MED ORDER — AMLODIPINE BESYLATE 10 MG PO TABS: 10.0000 mg | ORAL_TABLET | Freq: Every day | ORAL | 1 refills | Status: DC

## 2017-08-16 MED ORDER — DICLOFENAC SODIUM 75 MG PO TBEC: 75.0000 mg | DELAYED_RELEASE_TABLET | Freq: Two times a day (BID) | ORAL | 0 refills | Status: DC

## 2017-08-16 MED ORDER — CYCLOBENZAPRINE HCL 10 MG PO TABS: 10.0000 mg | ORAL_TABLET | Freq: Two times a day (BID) | ORAL | 0 refills | Status: DC | PRN

## 2017-08-16 NOTE — ED Triage Notes (Signed)
Pt reports pain in her left hip that radiates down in to her knee.  She states she tripped a few days ago and it started hurting after that.

## 2017-08-16 NOTE — Discharge Instructions (Addendum)
Continue to apply heat to affected area.

## 2017-08-16 NOTE — ED Provider Notes (Signed)
[place]    CSN: [id number] Arrival date & time: 08/16/17  1637     History   Chief Complaint Chief Complaint  Patient presents with  . Leg Pain    HPI Tina Horton is a 52 y.o. female.   52 y.o. female presents with pain to her left hip radiating down the lateral aspect to her left knee X. Condition is acute in nature. Condition is made better by advil. Condition is made worse by movement and weight bearing exercises. Patient has an extension history of chronic back pain and sciatica to right hip. Patient denies any difficulties with bowel or bladder.  No neurological deficit Patient requesting refill on HTN medication at this time     Past Medical History:  Diagnosis Date  . Anemia   . Arthritis    knees  . Bronchitis   . Diabetes mellitus without complication [facility])    age 78yo  . GERD (gastroesophageal reflux disease)   . Glaucoma    Dr. Einar Gip  . H/O mammogram 2005  . Headache   . Heart murmur   . Hyperlipidemia   . Hypertension   . Intermittent palpitations   . Legally blind   . Myopia   . Pneumonia   . Routine gynecological examination    last pap 2005  . Seasonal allergies   . Shortness of breath dyspnea   . Sinusitis   . Thyroid nodule     Patient Active Problem List   Diagnosis Date Noted  . Renal cyst 01/15/2016  . Hydronephrosis 01/15/2016  . Spinal stenosis of lumbar region 01/15/2016  . Anemia, iron deficiency 12/04/2015  . History of burning pain in leg 12/04/2015  . Bilateral low back pain without sciatica 12/04/2015  . S/P laparoscopic assisted vaginal hysterectomy (LAVH) 09/13/2015  . Encounter for health maintenance examination in adult 08/21/2015  . Hyperlipidemia 08/21/2015  . Glaucoma 08/21/2015  . Heart murmur 08/21/2015  . Thyroid nodule 08/21/2015  . Vaccine refused by patient 08/21/2015  . Noncompliance 03/21/2015  . Essential hypertension 03/21/2015  . Insulin dependent diabetes mellitus ([place]) 03/21/2015   . Lymphadenitis 03/21/2015  . SKIN LESION 10/19/2008  . SYSTOLIC MURMUR 27/07/5007  . DIABETES MELLITUS, II, COMPLICATIONS 38/18/2993  . HYPERCHOLESTEROLEMIA 07/17/2006  . GLAUCOMA 07/17/2006  . VISUAL DISTURBANCE NOS 07/17/2006  . HYPERTENSION, BENIGN SYSTEMIC 07/17/2006  . REFLUX ESOPHAGITIS 07/17/2006    Past Surgical History:  Procedure Laterality Date  . CYSTECTOMY     umbilicus  . INCISION AND DRAINAGE     abdominal superficial abscess  . LAPAROSCOPIC VAGINAL HYSTERECTOMY WITH SALPINGECTOMY Bilateral 09/13/2015   Procedure: LAPAROSCOPIC ASSISTED VAGINAL HYSTERECTOMY WITH SALPINGECTOMY, [facility] Colpoplasty;  Surgeon: Thurnell Lose, MD;  Location: [facility];  Service: Gynecology;  Laterality: Bilateral;  . LASIK     x2    OB History   None      Home Medications    Prior to Admission medications   Medication Sig Start Date End Date Taking? Authorizing Provider  aspirin EC 81 MG tablet Take 1 tablet (81 mg total) by mouth daily. 12/05/15  Yes Tysinger, Camelia Eng, PA-C  Continuous Blood Gluc Sensor (FREESTYLE LIBRE SENSOR SYSTEM) MISC USE TO CHECK BLOOD SUGAR THREE TIMES A DAY 07/24/17  Yes Tysinger, Camelia Eng, PA-C  insulin aspart (NOVOLOG FLEXPEN) 100 UNIT/ML FlexPen Inject 12 Units into the skin 3 (three) times daily with meals. Use sliding scale we provided 05/27/17  Yes Tysinger, Camelia Eng, PA-C  Insulin Glargine (LANTUS) 100  UNIT/ML Solostar Pen Inject 40 Units into the skin daily at 10 pm. 05/27/17  Yes Tysinger, Camelia Eng, PA-C  Insulin Pen Needle (NOVOFINE) 32G X 6 MM MISC USE PEN NEEDLES FOR LANTUS SOLORSTAR PEN 05/29/17  Yes Tysinger, Camelia Eng, PA-C  latanoprost (XALATAN) 0.005 % ophthalmic solution Place 1 drop into both eyes at bedtime.   Yes [provider]  AMBULATORY NON FORMULARY MEDICATION 1 Units by Other route daily. Lift chair 01/18/16   Narda Amber K, DO  amLODipine (NORVASC) 10 MG tablet Take 1 tablet (10 mg total) by mouth daily. 08/16/17   Jacqualine Mau, NP  lisinopril-hydrochlorothiazide (PRINZIDE,ZESTORETIC) 20-25 MG tablet TAKE 1 TABLET BY MOUTH DAILY Patient not taking: Reported on 05/27/2017 09/20/16   Tysinger, Camelia Eng, PA-C  PRODIGY NO CODING BLOOD GLUC test strip USE TO TEST BLOOD SUGAR TWICE DAILY Patient not taking: Reported on 05/27/2017 10/23/16   Tysinger, Camelia Eng, PA-C  PRODIGY TWIST TOP LANCETS 28G MISC USE TO TEST BLOOD SUGAR ONCE OR TWICE DAILY Patient not taking: Reported on 05/27/2017 10/23/16   Tysinger, Camelia Eng, PA-C    Family History Family History  Problem Relation Age of Onset  . Diabetes Mother   . Hypertension Mother   . Stroke Mother   . Hypertension Father   . Chronic Renal Failure Father   . Hypertension Sister   . Hypertension Brother   . Stroke Maternal Grandmother   . Asthma Daughter   . Healthy Daughter   . Healthy Son   . Cancer Neg Hx   . Heart disease Neg Hx     Social History Social History   Tobacco Use  . Smoking status: Never Smoker  . Smokeless tobacco: Never Used  Substance Use Topics  . Alcohol use: No    Alcohol/week: 0.0 oz  . Drug use: No     Allergies   Metformin and related and Tanzeum [albiglutide]   Review of Systems Review of Systems  Constitutional: Negative for chills and fever.  HENT: Negative for ear pain and sore throat.   Eyes: Negative for pain and visual disturbance.  Respiratory: Negative for cough and shortness of breath.   Cardiovascular: Negative for chest pain and palpitations.  Gastrointestinal: Negative for abdominal pain and vomiting.  Genitourinary: Negative for dysuria and hematuria.  Musculoskeletal: Negative for arthralgias and back pain.       Tingling down left hip  Skin: Negative for color change and rash.  Neurological: Negative for seizures and syncope.  All other systems reviewed and are negative.    Physical Exam Triage Vital Signs ED Triage Vitals  Enc Vitals Group     BP 08/16/17 1657 (!) 190/90     Pulse Rate 08/16/17 1657 (!) 123      Resp --      Temp 08/16/17 1657 100.2 F (37.9 C)     Temp Source 08/16/17 1657 Oral     SpO2 08/16/17 1657 99 %     Weight --      Height --      Head Circumference --      Peak Flow --      Pain Score 08/16/17 1658 5     Pain Loc --      Pain Edu? --      Excl. in [place]? --    No data found.  Updated Vital Signs BP (!) 190/90 (BP Location: Left Arm)   Pulse (!) 123   Temp 100.2 F (37.9 C) (  Oral)   LMP 09/13/2015   SpO2 99%   Visual Acuity Right Eye Distance:   Left Eye Distance:   Bilateral Distance:    Right Eye Near:   Left Eye Near:    Bilateral Near:     Physical Exam  Constitutional: She is oriented to person, place, and time. She appears well-developed and well-nourished.  HENT:  Head: Normocephalic and atraumatic.  Eyes: Conjunctivae are normal.  Neck: Normal range of motion.  Pulmonary/Chest: Effort normal.  Musculoskeletal: Normal range of motion.  Neurological: She is alert and oriented to person, place, and time.  Skin: Skin is warm.  Psychiatric: She has a normal mood and affect.  Nursing note and vitals reviewed.    UC Treatments / Results  Labs (all labs ordered are listed, but only abnormal results are displayed) Labs Reviewed - No data to display  EKG None Radiology No results found.  Procedures Procedures (including critical care time)  Medications Ordered in UC Medications - No data to display   Initial Impression / Assessment and Plan / UC Course  I have reviewed the triage vital signs and the nursing notes.  Pertinent labs & imaging results that were available during my care of the patient were reviewed by me and considered in my medical decision making (see chart for details).       Final Clinical Impressions(s) / UC Diagnoses   Final diagnoses:  None    ED Discharge Orders        Ordered    amLODipine (NORVASC) 10 MG tablet  Daily     08/16/17 1759       Controlled Substance Prescriptions [place] Controlled  Substance Registry consulted? Not Applicable   Jacqualine Mau, NP 08/16/17 [phone]

## 2017-08-20 ENCOUNTER — Ambulatory Visit (INDEPENDENT_AMBULATORY_CARE_PROVIDER_SITE_OTHER): Payer: BLUE CROSS/BLUE SHIELD | Admitting: Medical

## 2017-08-20 ENCOUNTER — Encounter: Payer: Self-pay | Admitting: Medical

## 2017-08-20 VITALS — BP 130/88 | HR 100 | Temp 98.1°F | Ht 61.0 in | Wt 156.6 lb

## 2017-08-20 DIAGNOSIS — M549 Dorsalgia, unspecified: Secondary | ICD-10-CM | POA: Diagnosis not present

## 2017-08-20 DIAGNOSIS — M79605 Pain in left leg: Secondary | ICD-10-CM | POA: Diagnosis not present

## 2017-08-20 DIAGNOSIS — E119 Type 2 diabetes mellitus without complications: Secondary | ICD-10-CM | POA: Diagnosis not present

## 2017-08-20 DIAGNOSIS — Z9119 Patient's noncompliance with other medical treatment and regimen: Secondary | ICD-10-CM | POA: Diagnosis not present

## 2017-08-20 DIAGNOSIS — IMO0001 Reserved for inherently not codable concepts without codable children: Secondary | ICD-10-CM

## 2017-08-20 DIAGNOSIS — E041 Nontoxic single thyroid nodule: Secondary | ICD-10-CM

## 2017-08-20 DIAGNOSIS — R110 Nausea: Secondary | ICD-10-CM | POA: Diagnosis not present

## 2017-08-20 DIAGNOSIS — Z129 Encounter for screening for malignant neoplasm, site unspecified: Secondary | ICD-10-CM | POA: Diagnosis not present

## 2017-08-20 DIAGNOSIS — I10 Essential (primary) hypertension: Secondary | ICD-10-CM | POA: Diagnosis not present

## 2017-08-20 DIAGNOSIS — J988 Other specified respiratory disorders: Secondary | ICD-10-CM | POA: Diagnosis not present

## 2017-08-20 DIAGNOSIS — E785 Hyperlipidemia, unspecified: Secondary | ICD-10-CM | POA: Diagnosis not present

## 2017-08-20 DIAGNOSIS — G8929 Other chronic pain: Secondary | ICD-10-CM | POA: Diagnosis not present

## 2017-08-20 DIAGNOSIS — Z794 Long term (current) use of insulin: Secondary | ICD-10-CM | POA: Diagnosis not present

## 2017-08-20 DIAGNOSIS — Z91199 Patient's noncompliance with other medical treatment and regimen due to unspecified reason: Secondary | ICD-10-CM

## 2017-08-20 MED ORDER — AMOXICILLIN 875 MG PO TABS: 875.0000 mg | ORAL_TABLET | Freq: Two times a day (BID) | ORAL | 0 refills | Status: DC

## 2017-08-20 MED ORDER — ONDANSETRON HCL 4 MG PO TABS: 4.0000 mg | ORAL_TABLET | Freq: Three times a day (TID) | ORAL | 0 refills | Status: DC | PRN

## 2017-08-20 NOTE — Patient Instructions (Addendum)
Encounter Diagnoses  Name Primary?  . Insulin dependent diabetes mellitus ([place]) Yes  . Essential hypertension   . Hyperlipidemia, unspecified hyperlipidemia type   . Noncompliance   . Screening for cancer   . Chronic back pain, unspecified back location, unspecified back pain laterality   . Left leg pain   . Respiratory tract infection   . Nausea   . Thyroid nodule     Below are some general recommendations I have for you:  Specific Concerns today:   Thyroid nodule  We are referring you for ultrasound of the left thyroid nodule  Routine labs today  We will call with lab results  Diabetes:  Continue monitoring your sugars regularly  We will call with results and recommendations  Nausea  You can use use Zofran for nausea  Respiratory tract infection  Rest   hydrate well  You can use Mucinex DM or Coricidin HBP OTC for cough/congestion  I would give it a few more days.  If not improving by Friday, then begin Amoxicillin antibiotic   High blood pressure  Continue Amlodipine daily  Back pain, leg pain  Use the recommendations from urgent care  You do have prior abnormal findings on MRI lumbar spine 2017  If you continue to deal with chronic back pain, I recommend a referral to a back specialist   Yearly screenings See your eye doctor yearly for routine vision care. See your dentist yearly for routine dental care including hygiene visits twice yearly. See me here yearly for a routine physical and preventative care visit   Cancer screening Colon cancer screening:  I recommend you have a colonoscopy if you have not had one.  Let me know if you are agreeable to referral for this?  Breast cancer screening -  Please call to schedule your mammogram at your convenience.  [facility]   [phone]          [phone] [address], [place], [address] [place],  [place] [address]        [place], [place] [address]     I have included other useful information below for your review.  Preventative Care for Adults - Female      MAINTAIN REGULAR HEALTH EXAMS:  A routine yearly physical is a good way to check in with your primary care provider about your health and preventive screening. It is also an opportunity to share updates about your health and any concerns you have, and receive a thorough all-over exam.   Most health insurance companies pay for at least some preventative services.  Check with your health plan for specific coverages.  WHAT PREVENTATIVE SERVICES DO WOMEN NEED?  Adult women should have their weight and blood pressure checked regularly.   Women age 3 and older should have their cholesterol levels checked regularly.  Women should be screened for cervical cancer with a Pap smear and pelvic exam beginning at either age 32, or 3 years after they become sexually activity.    Breast cancer screening generally begins at age 64 with a mammogram and breast exam by your primary care provider.    Beginning at age 54 and continuing to age 74, women should be screened for colorectal cancer.  Certain people may need continued testing until age 55.  Updating vaccinations is part of preventative care.  Vaccinations help protect against diseases such as the  flu.  Osteoporosis is a disease in which the bones lose minerals and strength as we age. Women ages 13 and over should discuss this with their caregivers, as should women after menopause who have other risk factors.  Lab tests are generally done as part of preventative care to screen for anemia and blood disorders, to screen for problems with the kidneys and liver, to screen for bladder problems, to check blood sugar, and to check your cholesterol level.  Preventative services generally include counseling about diet, exercise, avoiding tobacco, drugs, excessive alcohol consumption, and sexually transmitted  infections.    GENERAL RECOMMENDATIONS FOR GOOD HEALTH:  Healthy diet:  Eat a variety of foods, including fruit, vegetables, animal or vegetable protein, such as meat, fish, chicken, and eggs, or beans, lentils, tofu, and grains, such as rice.  Drink plenty of water daily.  Decrease saturated fat in the diet, avoid lots of red meat, processed foods, sweets, fast foods, and fried foods.  Exercise:  Aerobic exercise helps maintain good heart health. At least 30-40 minutes of moderate-intensity exercise is recommended. For example, a brisk walk that increases your heart rate and breathing. This should be done on most days of the week.   Find a type of exercise or a variety of exercises that you enjoy so that it becomes a part of your daily life.  Examples are running, walking, swimming, water aerobics, and biking.  For motivation and support, explore group exercise such as aerobic class, spin class, Zumba, Yoga,or  martial arts, etc.    Set exercise goals for yourself, such as a certain weight goal, walk or run in a race such as a 5k walk/run.  Speak to your primary care provider about exercise goals.  Disease prevention:  If you smoke or chew tobacco, find out from your caregiver how to quit. It can literally save your life, no matter how long you have been a tobacco user. If you do not use tobacco, never begin.   Maintain a healthy diet and normal weight. Increased weight leads to problems with blood pressure and diabetes.   The Body Mass Index or BMI is a way of measuring how much of your body is fat. Having a BMI above 27 increases the risk of heart disease, diabetes, hypertension, stroke and other problems related to obesity. Your caregiver can help determine your BMI and based on it develop an exercise and dietary program to help you achieve or maintain this important measurement at a healthful level.  High blood pressure causes heart and blood vessel problems.  Persistent high blood  pressure should be treated with medicine if weight loss and exercise do not work.   Fat and cholesterol leaves deposits in your arteries that can block them. This causes heart disease and vessel disease elsewhere in your body.  If your cholesterol is found to be high, or if you have heart disease or certain other medical conditions, then you may need to have your cholesterol monitored frequently and be treated with medication.   Ask if you should have a cardiac stress test if your history suggests this. A stress test is a test done on a treadmill that looks for heart disease. This test can find disease prior to there being a problem.  Menopause can be associated with physical symptoms and risks. Hormone replacement therapy is available to decrease these. You should talk to your caregiver about whether starting or continuing to take hormones is right for you.   Osteoporosis is a  disease in which the bones lose minerals and strength as we age. This can result in serious bone fractures. Risk of osteoporosis can be identified using a bone density scan. Women ages 17 and over should discuss this with their caregivers, as should women after menopause who have other risk factors. Ask your caregiver whether you should be taking a calcium supplement and Vitamin D, to reduce the rate of osteoporosis.   Avoid drinking alcohol in excess (more than two drinks per day).  Avoid use of street drugs. Do not share needles with anyone. Ask for professional help if you need assistance or instructions on stopping the use of alcohol, cigarettes, and/or drugs.  Brush your teeth twice a day with fluoride toothpaste, and floss once a day. Good oral hygiene prevents tooth decay and gum disease. The problems can be painful, unattractive, and can cause other health problems. Visit your dentist for a routine oral and dental check up and preventive care every 6-12 months.   Look at your skin regularly.  Use a mirror to look at your  back. Notify your caregivers of changes in moles, especially if there are changes in shapes, colors, a size larger than a pencil eraser, an irregular border, or development of new moles.  Safety:  Use seatbelts 100% of the time, whether driving or as a passenger.  Use safety devices such as hearing protection if you work in environments with loud noise or significant background noise.  Use safety glasses when doing any work that could send debris in to the eyes.  Use a helmet if you ride a bike or motorcycle.  Use appropriate safety gear for contact sports.  Talk to your caregiver about gun safety.  Use sunscreen with a SPF (or skin protection factor) of 15 or greater.  Lighter skinned people are at a greater risk of skin cancer. Don't forget to also wear sunglasses in order to protect your eyes from too much damaging sunlight. Damaging sunlight can accelerate cataract formation.   Practice safe sex. Use condoms. Condoms are used for birth control and to help reduce the spread of sexually transmitted infections (or STIs).  Some of the STIs are gonorrhea (the clap), chlamydia, syphilis, trichomonas, herpes, HPV (human papilloma virus) and HIV (human immunodeficiency virus) which causes AIDS. The herpes, HIV and HPV are viral illnesses that have no cure. These can result in disability, cancer and death.   Keep carbon monoxide and smoke detectors in your home functioning at all times. Change the batteries every 6 months or use a model that plugs into the wall.   Vaccinations:  Stay up to date with your tetanus shots and other required immunizations. You should have a booster for tetanus every 10 years. Be sure to get your flu shot every year, since 5%-20% of the [place] population comes down with the flu. The flu vaccine changes each year, so being vaccinated once is not enough. Get your shot in the fall, before the flu season peaks.   Other vaccines to consider:  Human Papilloma Virus or HPV causes  cancer of the cervix, and other infections that can be transmitted from person to person. There is a vaccine for HPV, and females should get immunized between the ages of 18 and 62. It requires a series of 3 shots.   Pneumococcal vaccine to protect against certain types of pneumonia.  This is normally recommended for adults age 73 or older.  However, adults younger than 53 years old with certain underlying conditions  such as diabetes, heart or lung disease should also receive the vaccine.  Shingles vaccine to protect against Varicella Zoster if you are older than age 71, or younger than 53 years old with certain underlying illness.  If you have not had the Shingrix vaccine, please call your insurer to inquire about coverage for the Shingrix vaccine given in 2 doses.   Some insurers cover this vaccine after age 61, some cover this after age 61.  If your insurer covers this, then call to schedule appointment to have this vaccine here  Hepatitis A vaccine to protect against a form of infection of the liver by a virus acquired from food.  Hepatitis B vaccine to protect against a form of infection of the liver by a virus acquired from blood or body fluids, particularly if you work in health care.  If you plan to travel internationally, check with your local health department for specific vaccination recommendations.  Cancer Screening:  Breast cancer screening is essential to preventive care for women. All women age 45 and older should perform a breast self-exam every month. At age 64 and older, women should have their caregiver complete a breast exam each year. Women at ages 73 and older should have a mammogram (x-ray film) of the breasts. Your caregiver can discuss how often you need mammograms.    Cervical cancer screening includes taking a Pap smear (sample of cells examined under a microscope) from the cervix (end of the uterus). It also includes testing for HPV (Human Papilloma Virus, which can cause  cervical cancer). Screening and a pelvic exam should begin at age 70, or 3 years after a woman becomes sexually active. Screening should occur every year, with a Pap smear but no HPV testing, up to age 17. After age 72, you should have a Pap smear every 3 years with HPV testing, if no HPV was found previously.   Most routine colon cancer screening begins at the age of 56. On a yearly basis, doctors may provide special easy to use take-home tests to check for hidden blood in the stool. Sigmoidoscopy or colonoscopy can detect the earliest forms of colon cancer and is life saving. These tests use a small camera at the end of a tube to directly examine the colon. Speak to your caregiver about this at age 56, when routine screening begins (and is repeated every 5 years unless early forms of pre-cancerous polyps or small growths are found).

## 2017-08-20 NOTE — Progress Notes (Signed)
Subjective: Chief Complaint  Patient presents with  . Diabetes  . Follow-up    on Saturday, due to having leg pain    Here for f/u.   She notes headache, nausea, coughing, body aches.   Has been around 2 flu contacts.   Went to urgent care the other day for leg pain and flu symptoms.  No flu test was done.   Still has these symptoms x 4 days.  Having a lot of nausea.  Per nurse, she is taking her insulin and just started back on Amlodipine and Aspirin.  Last visit she went on a long diatribe about her health, and had decided she wasn't going to take any medication for her chronic issues. Was going to take care of it naturally.  Having some leg pain.  Tripped about a week ago (has poor eyesight).  Had fallen on left side, hurt her hip and leg.   Went to urgent care Saturday for leg pain. Was advised she had sciatica, was given advil and flexeril.  In general still having right sided back pain, spasm.    Hypertension - this past Sunday started back on Amlodipine 10mg  daily.  She is not taking Lisinopril HCT.  Diabetes - taking Novolog with meals, sliding scale, and using Lantus 40u QHS.  Checking glucose regularly, stays below 130 fasting.   No other c/o.   Never had a colonoscopy.  Last mammogram several years ago.   Past Medical History:  Diagnosis Date  . Anemia   . Arthritis    knees  . Bronchitis   . Diabetes mellitus without complication [facility])    age 52yo  . GERD (gastroesophageal reflux disease)   . Glaucoma    Dr. Einar Gip  . H/O mammogram 2005  . Headache   . Heart murmur   . Hyperlipidemia   . Hypertension   . Intermittent palpitations   . Legally blind   . Myopia   . Pneumonia   . Routine gynecological examination    last pap 2005  . Seasonal allergies   . Shortness of breath dyspnea   . Sinusitis   . Thyroid nodule    Past Surgical History:  Procedure Laterality Date  . CYSTECTOMY     umbilicus  . INCISION AND DRAINAGE     abdominal superficial abscess   . LAPAROSCOPIC VAGINAL HYSTERECTOMY WITH SALPINGECTOMY Bilateral 09/13/2015   Procedure: LAPAROSCOPIC ASSISTED VAGINAL HYSTERECTOMY WITH SALPINGECTOMY, [facility] Colpoplasty;  Surgeon: Thurnell Lose, MD;  Location: [facility];  Service: Gynecology;  Laterality: Bilateral;  . LASIK     x2    ROS as in subjective    Objective: BP 130/88 (BP Location: Right Arm, Patient Position: Sitting, Cuff Size: Normal)   Pulse 100   Temp 98.1 F (36.7 C) (Oral)   Ht 5\' 1"  (1.549 m)   Wt 156 lb 9.6 oz (71 kg)   LMP 09/13/2015   SpO2 98%   BMI 29.59 kg/m   Wt Readings from Last 3 Encounters:  08/20/17 156 lb 9.6 oz (71 kg)  05/27/17 148 lb 9.6 oz (67.4 kg)  05/28/16 155 lb 3.2 oz (70.4 kg)   BP Readings from Last 3 Encounters:  08/20/17 130/88  08/16/17 (!) 190/90  05/27/17 (!) 144/86    General appearance: alert, no distress, WD/WN,  HEENT: normocephalic, sclerae pink, nares with mild turbinated swelling and erythema, pharynx normal Oral cavity: MMM, no lesions Neck: supple, no lymphadenopathy, thyroid nodule left lower approx 2cm?, no masses Heart: RRR, normal  S1, S2, 2/6 holosystolic murmurs unchanged  Lungs: CTA bilaterally, no wheezes, rhonchi, or rales Abdomen: +bs, soft, non tender, non distended, no masses, no hepatomegaly, no splenomegaly Back: right paraspinal lumbar tenderness, otherwise non tender, pain with flexion and extension which is somewhat slow and mildly limited, no obvious deformity Musculoskeletal: legs nontender, no swelling, no obvious deformity Extremities: no edema, no cyanosis, no clubbing Pulses: 1+ symmetric, upper and lower extremities, normal cap refill -SLR, normal strength of legs, normal heel and toe walk    Echocardiogram 08/2015, Dr. Quay Burow Study Conclusions - Left ventricle: The cavity size was normal. Wall thickness was   normal. Systolic function was vigorous. The estimated ejection   fraction was in the range of 65% to 70%. Wall motion  was normal;   there were no regional wall motion abnormalities. Doppler   parameters are consistent with abnormal left ventricular   relaxation (grade 1 diastolic dysfunction). - Aortic valve: There was no stenosis. - Mitral valve: There was no significant regurgitation. - Right ventricle: The cavity size was normal. Systolic function   was normal. - Pulmonary arteries: PA peak pressure: 30 mm Hg (S). - Inferior vena cava: The vessel was normal in size. The   respirophasic diameter changes were in the normal range (>= 50%),   consistent with normal central venous pressure.  MRI lumbar spine 12/2015 IMPRESSION: 5 cm left renal cyst with apparent left hydronephrosis. 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 Subarticular stenosis on left could cause impingement of the left L5 nerve root.  Moderate left foraminal narrowing L5-S1 due to spurring.   Assessment: Encounter Diagnoses  Name Primary?  . Insulin dependent diabetes mellitus ([place]) Yes  . Essential hypertension   . Hyperlipidemia, unspecified hyperlipidemia type   . Noncompliance   . Screening for cancer   . Chronic back pain, unspecified back location, unspecified back pain laterality   . Left leg pain   . Respiratory tract infection   . Nausea   . Thyroid nodule      Plan: We discussed her many concerns today, reviewed her diabetes and blood pressure concerns.  Last visit she was not willing to follow recommendations other than taking insulin.  She has apparently made some steps to improve on her compliance since last visit.   We will check routine labs today  I addressed her concerns today and gave the following recommendations:  Thyroid nodule  We are referring you for ultrasound of the left thyroid nodule  Routine labs today  We will call with lab results  Diabetes:  Continue monitoring your sugars regularly  We will  call with results and recommendations  Nausea  You can use use Zofran for nausea  Respiratory tract infection  Rest   hydrate well  You can use Mucinex DM or Coricidin HBP OTC for cough/congestion  I would give it a few more days.  If not improving by Friday, then begin Amoxicillin antibiotic   High blood pressure  Continue Amlodipine daily  Back pain, leg pain  Use the recommendations from urgent care  You do have prior abnormal findings on MRI lumbar spine 2017  If you continue to deal with chronic back pain, I recommend a referral to a back specialist  Yearly screenings See your eye doctor yearly for routine vision care. See your dentist yearly for routine dental care including hygiene visits twice yearly. See me here yearly for a routine physical and  preventative care visit  Cancer screening Colon cancer screening:  I recommend you have a colonoscopy if you have not had one.  Let me know if you are agreeable to referral for this?  Breast cancer screening -  Please call to schedule your mammogram at your convenience.   [place] was seen today for diabetes and follow-up.  Diagnoses and all orders for this visit:  Insulin dependent diabetes mellitus ([place]) -     CBC with Differential/Platelet -     Comprehensive metabolic panel -     Lipid panel -     Hemoglobin A1c -     HM DIABETES FOOT EXAM -     HM DIABETES EYE EXAM -     Microalbumin / creatinine urine ratio  Essential hypertension  Hyperlipidemia, unspecified hyperlipidemia type  Noncompliance  Screening for cancer -     MM DIGITAL SCREENING BILATERAL; Future  Chronic back pain, unspecified back location, unspecified back pain laterality  Left leg pain  Respiratory tract infection  Nausea  Thyroid nodule -     US Soft Tissue Head/Neck; Future -     TSH -     T4, Free  Other orders -     ondansetron (ZOFRAN) 4 MG tablet; Take 1 tablet (4 mg total) by mouth every 8 (eight) hours as needed  for nausea or vomiting. -     amoxicillin (AMOXIL) 875 MG tablet; Take 1 tablet (875 mg total) by mouth 2 (two) times daily.

## 2017-08-21 ENCOUNTER — Other Ambulatory Visit: Payer: Self-pay | Admitting: Medical

## 2017-08-21 ENCOUNTER — Telehealth: Payer: Self-pay

## 2017-08-21 ENCOUNTER — Telehealth: Payer: Self-pay | Admitting: Medical

## 2017-08-21 DIAGNOSIS — E119 Type 2 diabetes mellitus without complications: Principal | ICD-10-CM

## 2017-08-21 DIAGNOSIS — IMO0001 Reserved for inherently not codable concepts without codable children: Secondary | ICD-10-CM

## 2017-08-21 DIAGNOSIS — Z794 Long term (current) use of insulin: Principal | ICD-10-CM

## 2017-08-21 LAB — COMPREHENSIVE METABOLIC PANEL
ALT: 17 IU/L (ref 0–32)
AST: 16 IU/L (ref 0–40)
Albumin/Globulin Ratio: 1.6 (ref 1.2–2.2)
Albumin: 4.6 g/dL (ref 3.5–5.5)
Alkaline Phosphatase: 91 IU/L (ref 39–117)
BUN/Creatinine Ratio: 16 (ref 9–23)
BUN: 13 mg/dL (ref 6–24)
Bilirubin Total: 0.5 mg/dL (ref 0.0–1.2)
CO2: 24 mmol/L (ref 20–29)
Calcium: 10.1 mg/dL (ref 8.7–10.2)
Chloride: 101 mmol/L (ref 96–106)
Creatinine, Ser: 0.8 mg/dL (ref 0.57–1.00)
GFR calc Af Amer: 98 mL/min/{1.73_m2} (ref >59)
GFR calc non Af Amer: 85 mL/min/{1.73_m2} (ref >59)
Globulin, Total: 2.9 g/dL (ref 1.5–4.5)
Glucose: 154 mg/dL — ABNORMAL HIGH (ref 65–99)
Potassium: 3.6 mmol/L (ref 3.5–5.2)
Sodium: 142 mmol/L (ref 134–144)
Total Protein: 7.5 g/dL (ref 6.0–8.5)

## 2017-08-21 LAB — CBC WITH DIFFERENTIAL/PLATELET
Basophils Absolute: 0 10*3/uL (ref 0.0–0.2)
Basos: 0 %
EOS (ABSOLUTE): 0.1 10*3/uL (ref 0.0–0.4)
Eos: 3 %
Hematocrit: 41.8 % (ref 34.0–46.6)
Hemoglobin: 13.9 g/dL (ref 11.1–15.9)
Immature Grans (Abs): 0 10*3/uL (ref 0.0–0.1)
Immature Granulocytes: 0 %
Lymphocytes Absolute: 1.5 10*3/uL (ref 0.7–3.1)
Lymphs: 28 %
MCH: 29.3 pg (ref 26.6–33.0)
MCHC: 33.3 g/dL (ref 31.5–35.7)
MCV: 88 fL (ref 79–97)
Monocytes Absolute: 0.3 10*3/uL (ref 0.1–0.9)
Monocytes: 6 %
Neutrophils Absolute: 3.6 10*3/uL (ref 1.4–7.0)
Neutrophils: 63 %
Platelets: 401 10*3/uL — ABNORMAL HIGH (ref 150–379)
RBC: 4.75 x10E6/uL (ref 3.77–5.28)
RDW: 13.2 % (ref 12.3–15.4)
WBC: 5.6 10*3/uL (ref 3.4–10.8)

## 2017-08-21 LAB — LIPID PANEL
Chol/HDL Ratio: 4.3 ratio (ref 0.0–4.4)
Cholesterol, Total: 239 mg/dL — ABNORMAL HIGH (ref 100–199)
HDL: 55 mg/dL (ref >39)
LDL Calculated: 167 mg/dL — ABNORMAL HIGH (ref 0–99)
Triglycerides: 84 mg/dL (ref 0–149)
VLDL Cholesterol Cal: 17 mg/dL (ref 5–40)

## 2017-08-21 LAB — MICROALBUMIN / CREATININE URINE RATIO
Creatinine, Urine: 74.5 mg/dL
Microalb/Creat Ratio: 1194.8 mg/g creat — ABNORMAL HIGH (ref 0.0–30.0)
Microalbumin, Urine: 890.1 ug/mL

## 2017-08-21 LAB — HEMOGLOBIN A1C
Est. average glucose Bld gHb Est-mCnc: 183 mg/dL
Hgb A1c MFr Bld: 8 % — ABNORMAL HIGH (ref 4.8–5.6)

## 2017-08-21 LAB — T4, FREE: Free T4: 1.36 ng/dL (ref 0.82–1.77)

## 2017-08-21 LAB — TSH: TSH: 1.09 u[IU]/mL (ref 0.450–4.500)

## 2017-08-21 MED ORDER — INSULIN ASPART 100 UNIT/ML FLEXPEN: 15.0000 [IU] | PEN_INJECTOR | Freq: Three times a day (TID) | SUBCUTANEOUS | 5 refills | Status: DC

## 2017-08-21 MED ORDER — INSULIN GLARGINE 100 UNIT/ML SOLOSTAR PEN: 50.0000 [IU] | PEN_INJECTOR | Freq: Every day | SUBCUTANEOUS | 5 refills | Status: DC

## 2017-08-21 MED ORDER — ROSUVASTATIN CALCIUM 10 MG PO TABS: 10.0000 mg | ORAL_TABLET | Freq: Every day | ORAL | 1 refills | Status: DC

## 2017-08-21 MED ORDER — AMLODIPINE BESYLATE 10 MG PO TABS: 10.0000 mg | ORAL_TABLET | Freq: Every day | ORAL | 3 refills | Status: DC

## 2017-08-21 MED ORDER — ASPIRIN EC 81 MG PO TBEC: 81.0000 mg | DELAYED_RELEASE_TABLET | Freq: Every day | ORAL | 3 refills | Status: DC

## 2017-08-21 NOTE — Telephone Encounter (Signed)
pls call in the quantity on the last once listed in chart

## 2017-08-21 NOTE — Telephone Encounter (Signed)
Called patient and gave lab results. Patient had no questions or concerns.  Patient would like to start the medication and have the referral sent.

## 2017-08-21 NOTE — Telephone Encounter (Signed)
Recv'd fax from [employer] requires prescription for needles

## 2017-08-21 NOTE — Telephone Encounter (Signed)
-----   Message from Carlena Hurl, PA-C sent at 08/21/2017  8:03 AM EDT ----- Labs show HgbA1C diabetes marker at 8, improved but not at goal.    Bad cholesterol is way too high increase her risk for heart disease. Rest of labs ok  Recommendations: If she is currently doing 40 of Lantus nightly, I want her to increase to 45 units nightly.   She can increase 2 units per week if sugars are NOT staying under 130.  She can also try increasing 1 unit on each level of the sliding scale for the Novolog meal time insulin with meals.     I recommend she be on Crestor cholesterol medication to lower cholesterol and thus lower risk of heart disease.   See if agreeable  Continue plan for the thyroid ultrasound.  Finally, I think it would be a good idea to have a consult with a diabetes specialist.  If agreeable, refer.

## 2017-08-22 ENCOUNTER — Ambulatory Visit
Admission: RE | Admit: 2017-08-22 | Discharge: 2017-08-22 | Disposition: A | Discharge status: Home / Self Care | Payer: BLUE CROSS/BLUE SHIELD | Source: Ambulatory Visit | Attending: Medical | Admitting: Medical

## 2017-08-22 DIAGNOSIS — E041 Nontoxic single thyroid nodule: Secondary | ICD-10-CM

## 2017-08-22 NOTE — Telephone Encounter (Signed)
Looked in chart and called pharmacy & pt already had refills for needles at pharmacy

## 2017-08-25 ENCOUNTER — Telehealth: Payer: Self-pay

## 2017-08-25 NOTE — Telephone Encounter (Signed)
Called patient and gave lab results. Patient had no questions or concerns. Schedule for tomorrow to discuss labs.

## 2017-08-25 NOTE — Telephone Encounter (Signed)
-----   Message from Carlena Hurl, PA-C sent at 08/25/2017  7:42 AM EDT ----- Have her return to discuss ultrasound of neck.   There was thyroid enlargement but other findings as well we need to discuss

## 2017-08-26 ENCOUNTER — Ambulatory Visit: Payer: BLUE CROSS/BLUE SHIELD | Admitting: Medical

## 2017-08-26 ENCOUNTER — Telehealth: Payer: Self-pay | Admitting: Medical

## 2017-08-26 ENCOUNTER — Encounter: Payer: Self-pay | Admitting: Medical

## 2017-08-26 VITALS — BP 138/88 | HR 103 | Ht 61.0 in | Wt 156.8 lb

## 2017-08-26 DIAGNOSIS — I10 Essential (primary) hypertension: Secondary | ICD-10-CM

## 2017-08-26 DIAGNOSIS — R809 Proteinuria, unspecified: Secondary | ICD-10-CM | POA: Diagnosis not present

## 2017-08-26 DIAGNOSIS — E119 Type 2 diabetes mellitus without complications: Secondary | ICD-10-CM | POA: Diagnosis not present

## 2017-08-26 DIAGNOSIS — IMO0001 Reserved for inherently not codable concepts without codable children: Secondary | ICD-10-CM

## 2017-08-26 DIAGNOSIS — E785 Hyperlipidemia, unspecified: Secondary | ICD-10-CM | POA: Diagnosis not present

## 2017-08-26 DIAGNOSIS — Z794 Long term (current) use of insulin: Secondary | ICD-10-CM

## 2017-08-26 DIAGNOSIS — E041 Nontoxic single thyroid nodule: Secondary | ICD-10-CM

## 2017-08-26 DIAGNOSIS — R221 Localized swelling, mass and lump, neck: Secondary | ICD-10-CM

## 2017-08-26 MED ORDER — HYDROCORTISONE VALERATE 0.2 % EX OINT: 1.0000 "application " | TOPICAL_OINTMENT | Freq: Two times a day (BID) | CUTANEOUS | 0 refills | Status: DC

## 2017-08-26 NOTE — Telephone Encounter (Signed)
Called and spoke to [facility] and pt has not been seen since 01/2016

## 2017-08-26 NOTE — Patient Instructions (Addendum)
Encounter Diagnoses  Name Primary?  . Thyroid nodule Yes  . Neck mass   . Hyperlipidemia, unspecified hyperlipidemia type   . Essential hypertension   . Insulin dependent diabetes mellitus ([place])   . Microalbuminuria     Recommendations:  Thyroid nodule versus neck mass He recently had several findings on ultrasound including a thyroid goiter, possible thyroid nodule versus possible muscle mass in the left neck.  The radiologist recommended both a nodule biopsy as well as CT scan.  I recommend moving forward with a CT scan as the next step.  Please call your insurance company to find out what your deductible will be in co-pay and let me know when you are ready to move forward with this scan and biopsy of the nodule.  Thyroid goiter A goiter is an enlargement of the thyroid tissue.  This sometimes can be benign and long-standing without change for many years.  We will focus more on the thyroid nodule above   Microalbuminuria/early markers of kidney damage Your labs also showed microalbuminuria which is an early marker for damage to the kidney.  This is likely caused by combination of diabetes and high blood pressure.  I will want you to be seen by a kidney doctor in the coming months.  We will plan to do this after you have seen the diabetes specialist in May.  For now it is important to keep the blood sugars and blood pressures under good control with medication, low-salt diet, healthy diet and exercise.   Diabetes Continue plan to see the diabetes specialist in May.  Continue current medication.   Abnormal lipids - high cholesterol: Your lipids are under poor control.  LDL result does not yet meet goal  Medication: I recently started you on Crestor cholesterol medication at bedtime.  I recommend you return in 3 months fasting to recheck the level to make sure the medication is working along with diet changes as below.   Dyslipidemia Dyslipidemia is an imbalance of the lipids in  your blood. Lipids are waxy, fat-like proteins that your body needs in small amounts. Dyslipidemia often involves the lipids cholesterol or triglycerides. Common forms of dyslipidemia are:  High levels of bad cholesterol (LDL cholesterol). LDL cholesterol is the type of cholesterol that causes heart disease.  Low levels of good cholesterol (HDL cholesterol). HDL cholesterol is the type of cholesterol that helps protect against heart disease.  High levels of triglycerides. Triglycerides are a fatty substance in the blood linked to a buildup of plaque on your arteries. RISK FACTORS  Increased age.  Having a family history of high cholesterol.  Certain medicines, including birth control pills, diuretics, beta-blockers, and some medicines for depression.  Smoking.  Eating a high-fat diet.  Being overweight.  Medical conditions such as diabetes, polycystic ovary syndrome, pregnancy, kidney disease, and hypothyroidism.  Lack of regular exercise. SIGNS AND SYMPTOMS There are no signs or symptoms with dyslipidemia.  DIAGNOSIS  A simple blood test called a fasting blood test can be done to determine your level of:  Total cholesterol. This is the combined number of LDL cholesterol and HDL cholesterol. A healthy number is lower than 200.  LDL cholesterol. The goal number for LDL cholesterol is different for each person depending on risk factors. Ask your health care provider what your LDL cholesterol number should be.  HDL cholesterol. A healthy level of HDL cholesterol is 60 or higher. A number lower than 35 for men or 27 for women is a danger  sign.  Triglycerides. A healthy triglyceride number is less than 150. TREATMENT  Dyslipidemia is a treatable condition. Your health care provider will advise you on what type of treatment is best based on your age, your test results, and current guidelines. Treatment may include:   Dietary changes. A dietitian can help you create a meal plan. You  may need to:  Eat more foods that contain omega-3s, such as salmon and other fish.  Replace saturated fats and trans fats in your diet with healthy fats such as nuts, seeds, avocados, olive oil, and canola oil.  Regular exercise. This can help lower your LDL cholesterol, raise your HDL cholesterol, and help with weight management. Check with your health care provider before beginning an exercise program. Most people should participate in 30 minutes of brisk exercise 5 days a week.  Quitting smoking.  Medicines to lower LDL cholesterol and triglycerides. Your health care provider will monitor your lipid levels with regular blood tests. HOME CARE INSTRUCTIONS  Eat a healthy diet. Follow any diet instructions if they were given to you by your health care provider.  Maintain a healthy weight.  Exercise regularly based on the recommendations of your health care provider.  Do not use any tobacco products, including cigarettes, chewing tobacco, or electronic cigarettes.  Take medicines only as directed by your health care provider.  Keep all follow-up visits as directed by your health care provider. SEEK MEDICAL CARE IF: You are having possible side effects from your medicines. Document Released: 05/11/2013 Document Revised: 09/20/2013 Document Reviewed: 05/11/2013 [facility] Patient Information 2015 [place], [place]. This information is not intended to replace advice given to you by your health care provider. Make sure you discuss any questions you have with your health care provider.     Cholesterol Cholesterol is a fat. Your body needs a small amount of cholesterol. Cholesterol (plaque) may build up in your blood vessels (arteries). That makes you more likely to have a heart attack or stroke. You cannot feel your cholesterol level. Having a blood test is the only way to find out if your level is high. Keep your test results. Work with your doctor to keep your cholesterol at a good  level. What do the results mean?  Total cholesterol is how much cholesterol is in your blood.  LDL is bad cholesterol. This is the type that can build up. Try to have low LDL.  HDL is good cholesterol. It cleans your blood vessels and carries LDL away. Try to have high HDL.  Triglycerides are fat that the body can store or burn for energy. What are good levels of cholesterol?  Total cholesterol below 200.  LDL below 100 is good for people who have health risks. LDL below 70 is good for people who have very high risks.  HDL above 40 is good. It is best to have HDL of 60 or higher.  Triglycerides below 150. How can I lower my cholesterol? Diet Follow your diet program as told by your doctor.  Choose fish, white meat chicken, or [place] that is roasted or baked. Try not to eat red meat, fried foods, sausage, or lunch meats.  Eat lots of fresh fruits and vegetables.  Choose whole grains, beans, pasta, potatoes, and cereals.  Choose olive oil, corn oil, or canola oil. Only use small amounts.  Try not to eat butter, mayonnaise, shortening, or palm kernel oils.  Try not to eat foods with trans fats.  Choose low-fat or nonfat dairy foods. ?  Drink skim or nonfat milk. ? Eat low-fat or nonfat yogurt and cheeses. ? Try not to drink whole milk or cream. ? Try not to eat ice cream, egg yolks, or full-fat cheeses.  Healthy desserts include angel food cake, ginger snaps, animal crackers, hard candy, popsicles, and low-fat or nonfat frozen yogurt. Try not to eat pastries, cakes, pies, and cookies.  Exercise Follow your exercise program as told by your doctor.  Be more active. Try gardening, walking, and taking the stairs.  Ask your doctor about ways that you can be more active.  Medicine  Take over-the-counter and prescription medicines only as told by your doctor. This information is not intended to replace advice given to you by your health care provider. Make sure you discuss  any questions you have with your health care provider. Document Released: 08/02/2008 Document Revised: 12/06/2015 Document Reviewed: 11/16/2015 Elsevier Interactive Patient Education  [employer].

## 2017-08-26 NOTE — Telephone Encounter (Signed)
Spoke with pt about Novolog Pt Assistance and she would like to continue if still eligible.  Called [employer] and pt is still eligible until 12/07/17, reordered Novolog.  Called [employer] and she is still eligible til 12/10/17 for her Lantus and they are faxing reorder form.  Reorder faxed to [facility]

## 2017-08-26 NOTE — Telephone Encounter (Signed)
Please get a copy of last Urology note from [facility].  The last one I see is from 01/2016, but she says she has been there since then.

## 2017-08-26 NOTE — Progress Notes (Addendum)
Subjective: Chief Complaint  Patient presents with  . Results    [place]   Here with husband to review recent labs and ultrasound of thyroid.   Past Medical History:  Diagnosis Date  . Anemia   . Arthritis    knees  . Bronchitis   . Diabetes mellitus without complication [facility])    age 52yo  . GERD (gastroesophageal reflux disease)   . Glaucoma    Dr. Einar Gip  . H/O mammogram 2005  . Headache   . Heart murmur   . Hyperlipidemia   . Hypertension   . Intermittent palpitations   . Legally blind   . Myopia   . Pneumonia   . Routine gynecological examination    last pap 2005  . Seasonal allergies   . Shortness of breath dyspnea   . Sinusitis   . Thyroid nodule    Current Outpatient Medications on File Prior to Visit  Medication Sig Dispense Refill  . AMBULATORY NON FORMULARY MEDICATION 1 Units by Other route daily. Lift chair 1 Units 0  . amLODipine (NORVASC) 10 MG tablet Take 1 tablet (10 mg total) by mouth daily. 90 tablet 3  . aspirin EC 81 MG tablet Take 1 tablet (81 mg total) by mouth daily. 90 tablet 3  . Continuous Blood Gluc Sensor (FREESTYLE LIBRE SENSOR SYSTEM) MISC USE TO CHECK BLOOD SUGAR THREE TIMES A DAY 3 each 0  . insulin aspart (NOVOLOG FLEXPEN) 100 UNIT/ML FlexPen Inject 15 Units into the skin 3 (three) times daily with meals. Use sliding scale we provided 30 mL 5  . Insulin Glargine (LANTUS) 100 UNIT/ML Solostar Pen Inject 50 Units into the skin daily at 10 pm. 30 mL 5  . Insulin Pen Needle (NOVOFINE) 32G X 6 MM MISC USE PEN NEEDLES FOR LANTUS SOLORSTAR PEN 200 each 5  . latanoprost (XALATAN) 0.005 % ophthalmic solution Place 1 drop into both eyes at bedtime.    . ondansetron (ZOFRAN) 4 MG tablet Take 1 tablet (4 mg total) by mouth every 8 (eight) hours as needed for nausea or vomiting. 20 tablet 0  . PRODIGY NO CODING BLOOD GLUC test strip USE TO TEST BLOOD SUGAR TWICE DAILY 100 each 4  . PRODIGY TWIST TOP LANCETS 28G MISC USE TO TEST BLOOD SUGAR ONCE OR  TWICE DAILY 100 each 4  . rosuvastatin (CRESTOR) 10 MG tablet Take 1 tablet (10 mg total) by mouth at bedtime. 90 tablet 1   No current facility-administered medications on file prior to visit.    Past Surgical History:  Procedure Laterality Date  . CYSTECTOMY     umbilicus  . INCISION AND DRAINAGE     abdominal superficial abscess  . LAPAROSCOPIC VAGINAL HYSTERECTOMY WITH SALPINGECTOMY Bilateral 09/13/2015   Procedure: LAPAROSCOPIC ASSISTED VAGINAL HYSTERECTOMY WITH SALPINGECTOMY, [facility]s Colpoplasty;  Surgeon: Thurnell Lose, MD;  Location: [facility];  Service: Gynecology;  Laterality: Bilateral;  . LASIK     x2     ROS as in subjective   Objective: BP 138/88 (BP Location: Right Arm, Patient Position: Sitting, Cuff Size: Normal)   Pulse (!) 103   Ht 5\' 1"  (1.549 m)   Wt 156 lb 12.8 oz (71.1 kg)   LMP 09/13/2015   SpO2 97%   BMI 29.63 kg/m   Gen: wd, wn, nad Left neck post auricular inferior to hair line with 1cm patch of rough skin, without induration, warmth or fluctuance Left thyroid region of neck with 2-3 cm fullness, otherwise no other obvious mass,  lymphadenopathy or other abnormal findings    Assessment: Encounter Diagnoses  Name Primary?  . Thyroid nodule Yes  . Neck mass   . Hyperlipidemia, unspecified hyperlipidemia type   . Essential hypertension   . Insulin dependent diabetes mellitus ([place])   . Microalbuminuria      Plan: We discussed recent labs, ultrasound results, recommendations as below.   They voiced understanding of recommendations.  She already has her appt with endocrinology in May  She declines referral for CT of neck, biopsy of nodule in neck and nephrology referral today.   She needs to check with insurance and finances first.    I advised she let me know ASAP to move forward with the recommendations including:   CT neck  Biopsy of neck mass, likely thyroid nodule, but we think the thyroid nodule hasn't changed in a long time,  reviewed recent thyroid labs  Referral to nephrology given microalbuminuria  Recommendations:  Thyroid nodule versus neck mass He recently had several findings on ultrasound including a thyroid goiter, possible thyroid nodule versus possible muscle mass in the left neck.  The radiologist recommended both a nodule biopsy as well as CT scan.  I recommend moving forward with a CT scan as the next step.  Please call your insurance company to find out what your deductible will be in co-pay and let me know when you are ready to move forward with this scan and biopsy of the nodule.  Thyroid goiter A goiter is an enlargement of the thyroid tissue.  This sometimes can be benign and long-standing without change for many years.  We will focus more on the thyroid nodule above   Microalbuminuria/early markers of kidney damage Your labs also showed microalbuminuria which is an early marker for damage to the kidney.  This is likely caused by combination of diabetes and high blood pressure.  I will want you to be seen by a kidney doctor in the coming months.  We will plan to do this after you have seen the diabetes specialist in May.  For now it is important to keep the blood sugars and blood pressures under good control with medication, low-salt diet, healthy diet and exercise.   Diabetes Continue plan to see the diabetes specialist in May.  Continue current medication.   Abnormal lipids - high cholesterol: Your lipids are under poor control.  LDL result does not yet meet goal  Medication: I recently started you on Crestor cholesterol medication at bedtime.  I recommend you return in 3 months fasting to recheck the level to make sure the medication is working along with diet changes as per the handout  Rash on left neck - can use prn Hydrocodone for up to a week at a time.  Call if not improving.   Addendum: I reviewed back over her 02/09/16 Urology notes.   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   CT 01/17/16 showed severe hydronephrosis left kidney wihtout uretal dilation.   At that time urology discussed possibility of left sided nephrectomy.  I don't think she ever had this done  See chart notes from 05/2017, at that time she was frustrated, and declined to use ACE/ARB or many other recommendations for managing her known healht issues.  Her f/u and compliance has been good at times, not good at other times.  Sometimes this has been due to financial limitations, other  times just not compliant.   This has certainly affected her care and probably has contributed to some of her health issues/complications.     We will request most recent Urology notes as she thinks she has been back to them since 01/2016.   I want to go ahead and refer her to nephrology for consult but she wants to address things a little slower starting with the endocrinology consult in May 2019.  I also don't want to overwhelm her today.   Husband is currently out of work, so finances are tight currently.    Jalexus was seen today for results.  Diagnoses and all orders for this visit:  Thyroid nodule  Neck mass  Hyperlipidemia, unspecified hyperlipidemia type  Essential hypertension  Insulin dependent diabetes mellitus ([place])  Microalbuminuria  Other orders -     hydrocortisone valerate ointment (WESTCORT) 0.2 %; Apply 1 application topically 2 (two) times daily.  Spent > 45 minutes face to face with patient in discussion of symptoms, evaluation, plan and recommendations.

## 2017-08-30 ENCOUNTER — Other Ambulatory Visit: Payer: Self-pay | Admitting: Medical

## 2017-09-01 ENCOUNTER — Other Ambulatory Visit: Payer: Self-pay

## 2017-09-01 MED ORDER — FREESTYLE LIBRE SENSOR SYSTEM MISC: 0 refills | Status: DC

## 2017-09-02 NOTE — Telephone Encounter (Signed)
done

## 2017-09-04 NOTE — Telephone Encounter (Signed)
Pt Assistance Lantus received, pt informed

## 2017-09-16 NOTE — Telephone Encounter (Signed)
Pt Assistanct Novolog Flex pens #4 received with 2 boxes Novofine needles.  Pt informed

## 2017-09-22 ENCOUNTER — Telehealth: Payer: Self-pay

## 2017-09-22 ENCOUNTER — Other Ambulatory Visit: Payer: Self-pay | Admitting: Medical

## 2017-09-22 MED ORDER — TRIAMTERENE-HCTZ 37.5-25 MG PO TABS: 1.0000 | ORAL_TABLET | Freq: Every day | ORAL | 2 refills | Status: DC

## 2017-09-22 NOTE — Telephone Encounter (Signed)
Patient states that Amlodipine has been making her ankles swell and she stopped taking it for a while and swelling went down.  She wants to get another BP med sent to her [facility] [address]

## 2017-09-22 NOTE — Telephone Encounter (Signed)
I sent trial of Dyazide 1 tablet daily in the morning.  F/u 85mo on this

## 2017-09-23 ENCOUNTER — Ambulatory Visit: Payer: BLUE CROSS/BLUE SHIELD | Admitting: Endocrinology

## 2017-09-23 NOTE — Telephone Encounter (Signed)
Pt was notified and already scheduled for June  (disregard the previous message)

## 2017-09-23 NOTE — Telephone Encounter (Signed)
Pt was notified of results

## 2017-10-02 ENCOUNTER — Other Ambulatory Visit: Payer: Self-pay | Admitting: Medical

## 2017-10-24 ENCOUNTER — Encounter: Payer: Self-pay | Admitting: Medical

## 2017-10-24 ENCOUNTER — Ambulatory Visit: Payer: BLUE CROSS/BLUE SHIELD | Admitting: Medical

## 2017-10-24 VITALS — BP 180/98 | HR 97 | Wt 149.0 lb

## 2017-10-24 DIAGNOSIS — E785 Hyperlipidemia, unspecified: Secondary | ICD-10-CM | POA: Diagnosis not present

## 2017-10-24 DIAGNOSIS — Z794 Long term (current) use of insulin: Secondary | ICD-10-CM

## 2017-10-24 DIAGNOSIS — IMO0002 Reserved for concepts with insufficient information to code with codable children: Secondary | ICD-10-CM

## 2017-10-24 DIAGNOSIS — E1165 Type 2 diabetes mellitus with hyperglycemia: Secondary | ICD-10-CM | POA: Diagnosis not present

## 2017-10-24 DIAGNOSIS — E118 Type 2 diabetes mellitus with unspecified complications: Secondary | ICD-10-CM | POA: Diagnosis not present

## 2017-10-24 DIAGNOSIS — Z129 Encounter for screening for malignant neoplasm, site unspecified: Secondary | ICD-10-CM | POA: Diagnosis not present

## 2017-10-24 DIAGNOSIS — Z9119 Patient's noncompliance with other medical treatment and regimen: Secondary | ICD-10-CM

## 2017-10-24 DIAGNOSIS — R809 Proteinuria, unspecified: Secondary | ICD-10-CM | POA: Diagnosis not present

## 2017-10-24 DIAGNOSIS — R221 Localized swelling, mass and lump, neck: Secondary | ICD-10-CM | POA: Diagnosis not present

## 2017-10-24 DIAGNOSIS — N189 Chronic kidney disease, unspecified: Secondary | ICD-10-CM

## 2017-10-24 DIAGNOSIS — I10 Essential (primary) hypertension: Secondary | ICD-10-CM

## 2017-10-24 DIAGNOSIS — E041 Nontoxic single thyroid nodule: Secondary | ICD-10-CM

## 2017-10-24 DIAGNOSIS — N1330 Unspecified hydronephrosis: Secondary | ICD-10-CM

## 2017-10-24 DIAGNOSIS — Z91199 Patient's noncompliance with other medical treatment and regimen due to unspecified reason: Secondary | ICD-10-CM

## 2017-10-24 LAB — COMPREHENSIVE METABOLIC PANEL
ALT: 23 IU/L (ref 0–32)
AST: 16 IU/L (ref 0–40)
Albumin/Globulin Ratio: 1.8 (ref 1.2–2.2)
Albumin: 4.7 g/dL (ref 3.5–5.5)
Alkaline Phosphatase: 91 IU/L (ref 39–117)
BUN/Creatinine Ratio: 19 (ref 9–23)
BUN: 18 mg/dL (ref 6–24)
Bilirubin Total: 0.6 mg/dL (ref 0.0–1.2)
CO2: 26 mmol/L (ref 20–29)
Calcium: 10.3 mg/dL — ABNORMAL HIGH (ref 8.7–10.2)
Chloride: 99 mmol/L (ref 96–106)
Creatinine, Ser: 0.96 mg/dL (ref 0.57–1.00)
GFR calc Af Amer: 79 mL/min/{1.73_m2} (ref >59)
GFR calc non Af Amer: 68 mL/min/{1.73_m2} (ref >59)
Globulin, Total: 2.6 g/dL (ref 1.5–4.5)
Glucose: 120 mg/dL — ABNORMAL HIGH (ref 65–99)
Potassium: 3.7 mmol/L (ref 3.5–5.2)
Sodium: 141 mmol/L (ref 134–144)
Total Protein: 7.3 g/dL (ref 6.0–8.5)

## 2017-10-24 LAB — LIPID PANEL
Chol/HDL Ratio: 2.8 ratio (ref 0.0–4.4)
Cholesterol, Total: 140 mg/dL (ref 100–199)
HDL: 50 mg/dL (ref >39)
LDL Calculated: 75 mg/dL (ref 0–99)
Triglycerides: 73 mg/dL (ref 0–149)
VLDL Cholesterol Cal: 15 mg/dL (ref 5–40)

## 2017-10-24 NOTE — Progress Notes (Signed)
Subjective: Chief Complaint  Patient presents with  . 1 month follow-up    1 month follow-up on bp med   Here for f/u.   She stayed with her sister all week at hospital last week and forgot to take her BP medication all week.  Sister has stage IV breast cancer.  Her sister has 53 yo daughter.   Tina Horton is handling her sister's affairs as well.  She has not yet seen endocrinology and nephrology.  She had to cancel the diabetes appt. Never heard about referral she says.  Using Lantus 40 units daily, hasn't used this the last few nights.    Novolog using sliding scales with meals.    morning sugars been running ok if taking Lantus, but high if forgetting to take insulin.     She reports that she is taking Crestor.  No prior colonoscopy.  She notes prior problems with anesthesia which is the reason why she has not undergone a colonoscopy  She has a skin lesion of her back she wants me to look at.  Past Medical History:  Diagnosis Date  . Anemia   . Arthritis    knees  . Bronchitis   . Diabetes mellitus without complication [facility])    age 45yo  . GERD (gastroesophageal reflux disease)   . Glaucoma    Dr. Einar Gip  . H/O mammogram 2005  . Headache   . Heart murmur   . Hyperlipidemia   . Hypertension   . Intermittent palpitations   . Legally blind   . Myopia   . Pneumonia   . Routine gynecological examination    last pap 2005  . Seasonal allergies   . Shortness of breath dyspnea   . Sinusitis   . Thyroid nodule    Current Outpatient Medications on File Prior to Visit  Medication Sig Dispense Refill  . aspirin EC 81 MG tablet Take 1 tablet (81 mg total) by mouth daily. 90 tablet 3  . Continuous Blood Gluc Sensor (FREESTYLE LIBRE SENSOR SYSTEM) MISC USE TO CHECK BLOOD SUGAR THREE TIMES A DAY 3 each 0  . insulin aspart (NOVOLOG FLEXPEN) 100 UNIT/ML FlexPen Inject 15 Units into the skin 3 (three) times daily with meals. Use sliding scale we provided 30 mL 5  . Insulin Glargine  (LANTUS) 100 UNIT/ML Solostar Pen Inject 50 Units into the skin daily at 10 pm. (Patient taking differently: Inject 40 Units into the skin daily at 10 pm. ) 30 mL 5  . Insulin Pen Needle (NOVOFINE) 32G X 6 MM MISC USE PEN NEEDLES FOR LANTUS SOLORSTAR PEN 200 each 5  . latanoprost (XALATAN) 0.005 % ophthalmic solution Place 1 drop into both eyes at bedtime.    [name] PRODIGY NO CODING BLOOD GLUC test strip USE TO TEST BLOOD SUGAR TWICE DAILY 100 each 4  . PRODIGY TWIST TOP LANCETS 28G MISC USE TO TEST BLOOD SUGAR ONCE OR TWICE DAILY 100 each 4  . rosuvastatin (CRESTOR) 10 MG tablet Take 1 tablet (10 mg total) by mouth at bedtime. 90 tablet 1  . triamterene-hydrochlorothiazide (MAXZIDE-25) 37.5-25 MG tablet Take 1 tablet by mouth daily. 30 tablet 2   No current facility-administered medications on file prior to visit.    ROS as in subjective   Objective: BP (!) 180/98   Pulse 97   Wt 149 lb (67.6 kg)   LMP 09/13/2015   BMI 28.15 kg/m   General appearence: alert, no distress, WD/WN,  Neck: supple, no lymphadenopathy, no thyromegaly,  no masses Heart: RRR, normal S1, S2, no murmurs Lungs: CTA bilaterally, no wheezes, rhonchi, or rales Ext: no edema Pulses: 2+ symmetric, upper and lower extremities, normal cap refill Back: No obvious deformity, there is a slight little patch of dry skin in this area of the right rhomboid region, nontender     Assessment: Encounter Diagnoses  Name Primary?  [name] Uncontrolled diabetes mellitus with complication, with long-term current use of insulin ([place]) Yes  . Noncompliance   . Essential hypertension   . Thyroid nodule   . Screening for cancer   . Neck mass   . Microalbuminuria   . Hyperlipidemia, unspecified hyperlipidemia type   . Hydronephrosis, unspecified hydronephrosis type   . Chronic kidney disease, unspecified CKD stage     Plan: I expressed my empathy for her situation with her sister.  Noncompliance- for whatever reason her main issue  continues to be noncompliance with treatment.  She has complications already including abnormal vision, chronic kidney disease, thyroid disease, and has been reluctant to use the recommendations we have offered in the past regarding medications to control diabetes, blood pressure, and just general compliance.  She notes today that she misses her Lantus periodically and she did not take her blood pressure medicine all last week as she was looking after her sister in the hospital  Hypertension-advised compliance with medication  Hyperlipidemia-we added Crestor last visit she was willing to do this, recheck lab today fasting  Diabetes-last visit I referred to endocrinology, she ended up canceling that appointment.  I recommend she reschedule this.  Thyroid goiter, thyroid nodule, neck mass- she had a thyroid ultrasound on 08/22/2017 showing multinodular goiter, a specific nodule of the left inferior thyroid measuring 2.4 x 2.1 cm.  There was also an ill-defined masslike expansion of the strap muscle of the neck on the left potentially the sternocleidomastoid.  It was recommended that she have a CT scan of the neck and a thyroid nodule biopsy.  She declined both at her last visit.  I recommended again that she have this done.  She will consider this.  Chronic kidney disease, microalbuminuria and diabetes and hypertension She had an ultrasound of her kidneys back in 01/09/2016 showing significant left hydronephrosis with mild right hydronephrosis, no left ureteral jet seen.  She had nuclear medicine renal imaging study back on 02/05/2016 showing normal functioning right kidney, markedly diminished perfusion of the left kidney.  I again today reiterated need to keep her blood pressure and blood sugar under control as she already has abnormal functioning of her kidneys.  Last visit I referred to nephrology but somehow this referral either got missed, and she says she never got a call back.  We will refer again to  nephrology  I advised she call and schedule mammogram.  Order has been placed in the computer  I have advised to check insurance on Cologuard test.  She declines colonoscopy.  No prior colonoscopy.  One reason she gives for no prior colonoscopy as she had a prior problem with anesthesia, not willing to go under anesthesia at this moment.  Skin/back concern - no obvious abnormality.   Advised OTC lotion for dry skin, massage for rhomboid area.  Tina Horton was seen today for 1 month follow-up.  Diagnoses and all orders for this visit:  Uncontrolled diabetes mellitus with complication, with long-term current use of insulin ([place]) -     Lipid panel -     Comprehensive metabolic panel -     Ambulatory  referral to Nephrology  Noncompliance -     Ambulatory referral to Nephrology  Essential hypertension -     Ambulatory referral to Nephrology  Thyroid nodule  Screening for cancer -     MM DIGITAL SCREENING BILATERAL; Future  Neck mass  Microalbuminuria -     Ambulatory referral to Nephrology  Hyperlipidemia, unspecified hyperlipidemia type -     Lipid panel -     Comprehensive metabolic panel  Hydronephrosis, unspecified hydronephrosis type -     Ambulatory referral to Nephrology  Chronic kidney disease, unspecified CKD stage  Follow-up pending labs and referrals

## 2017-10-24 NOTE — Patient Instructions (Signed)
Recommendations:  It is important you take your medications every single day  Please call the diabetes office we referred you to to reschedule your appointment, [facility]  I recommend you call your insurance about coverage for Cologuard colon cancer screening which is noninvasive.  We will refer you again to the kidney doctor, [facility]  We still recommend you have a CT scan of your neck and thyroid biopsy.  When you are ready to do this let me know  Please call to schedule your mammogram.  [facility]  [phone] [address], [place], [place] [address]

## 2017-10-27 ENCOUNTER — Encounter: Payer: Self-pay | Admitting: Medical

## 2017-10-27 ENCOUNTER — Other Ambulatory Visit: Payer: Self-pay | Admitting: Medical

## 2017-10-30 ENCOUNTER — Telehealth: Payer: Self-pay | Admitting: Medical

## 2017-10-30 ENCOUNTER — Encounter: Payer: Self-pay | Admitting: Medical

## 2017-10-30 ENCOUNTER — Ambulatory Visit: Payer: BLUE CROSS/BLUE SHIELD | Admitting: Medical

## 2017-10-30 VITALS — BP 164/86 | HR 97 | Temp 98.2°F | Ht 61.0 in | Wt 153.2 lb

## 2017-10-30 DIAGNOSIS — R059 Cough, unspecified: Secondary | ICD-10-CM

## 2017-10-30 DIAGNOSIS — M5441 Lumbago with sciatica, right side: Secondary | ICD-10-CM | POA: Diagnosis not present

## 2017-10-30 DIAGNOSIS — R195 Other fecal abnormalities: Secondary | ICD-10-CM

## 2017-10-30 DIAGNOSIS — R05 Cough: Secondary | ICD-10-CM | POA: Diagnosis not present

## 2017-10-30 DIAGNOSIS — R221 Localized swelling, mass and lump, neck: Secondary | ICD-10-CM

## 2017-10-30 DIAGNOSIS — G8929 Other chronic pain: Secondary | ICD-10-CM | POA: Diagnosis not present

## 2017-10-30 DIAGNOSIS — I889 Nonspecific lymphadenitis, unspecified: Secondary | ICD-10-CM

## 2017-10-30 DIAGNOSIS — E041 Nontoxic single thyroid nodule: Secondary | ICD-10-CM

## 2017-10-30 DIAGNOSIS — B349 Viral infection, unspecified: Secondary | ICD-10-CM

## 2017-10-30 DIAGNOSIS — M4800 Spinal stenosis, site unspecified: Secondary | ICD-10-CM | POA: Diagnosis not present

## 2017-10-30 DIAGNOSIS — I10 Essential (primary) hypertension: Secondary | ICD-10-CM

## 2017-10-30 DIAGNOSIS — T7840XA Allergy, unspecified, initial encounter: Secondary | ICD-10-CM

## 2017-10-30 MED ORDER — TRIAMCINOLONE ACETONIDE 0.1 % EX CREA: 1.0000 "application " | TOPICAL_CREAM | Freq: Two times a day (BID) | CUTANEOUS | 0 refills | Status: DC

## 2017-10-30 NOTE — Telephone Encounter (Signed)
Done

## 2017-10-30 NOTE — Progress Notes (Signed)
Subjective: Chief Complaint  Patient presents with  . Mass    mass on her neck   . Diarrhea    with nausea   . Nasal Congestion    with scratchy throat and slight headache    Here today accompanied by her husband.  She  wanted  to come and get checked out as she wants to visit her sister who is undergoing chemotherapy, want to know she was safe to go over there.  She reports a 1 day history of scratchy throat, cough, headache, congestion, loose stool, nausea.  No body aches, no chills, no fever, no productive cough.  She has an area of tenderness on her left neck  The area of tenderness of her left neck is close to the mass that we saw on ultrasound.  She wants to go ahead and get this checked out.  She has ongoing chronic back pain, sciatica down the right leg, lately this is what been worse and limiting her range of motion.  She denies incontinence or trouble with bowels or bladder.  Since last visit she has gotten a call about her appointment with the kidney doctor, the appointment for endocrinology is August.  Past Medical History:  Diagnosis Date  . Anemia   . Arthritis    knees  . Bronchitis   . Diabetes mellitus without complication [facility])    age 52yo  . GERD (gastroesophageal reflux disease)   . Glaucoma    Dr. Einar Gip  . H/O mammogram 2005  . Headache   . Heart murmur   . Hyperlipidemia   . Hypertension   . Intermittent palpitations   . Legally blind   . Myopia   . Pneumonia   . Routine gynecological examination    last pap 2005  . Seasonal allergies   . Shortness of breath dyspnea   . Sinusitis   . Thyroid nodule    Current Outpatient Medications on File Prior to Visit  Medication Sig Dispense Refill  . aspirin EC 81 MG tablet Take 1 tablet (81 mg total) by mouth daily. 90 tablet 3  . Continuous Blood Gluc Sensor (FREESTYLE LIBRE SENSOR SYSTEM) MISC USE TO CHECK BLOOD SUGAR THREE TIMES A DAY 3 each 0  . insulin aspart (NOVOLOG FLEXPEN) 100 UNIT/ML FlexPen  Inject 15 Units into the skin 3 (three) times daily with meals. Use sliding scale we provided 30 mL 5  . Insulin Glargine (LANTUS) 100 UNIT/ML Solostar Pen Inject 50 Units into the skin daily at 10 pm. (Patient taking differently: Inject 40 Units into the skin daily at 10 pm. ) 30 mL 5  . Insulin Pen Needle (NOVOFINE) 32G X 6 MM MISC USE PEN NEEDLES FOR LANTUS SOLORSTAR PEN 200 each 5  . latanoprost (XALATAN) 0.005 % ophthalmic solution Place 1 drop into both eyes at bedtime.    [name] PRODIGY NO CODING BLOOD GLUC test strip USE TO TEST BLOOD SUGAR TWICE DAILY 100 each 4  . PRODIGY TWIST TOP LANCETS 28G MISC USE TO TEST BLOOD SUGAR ONCE OR TWICE DAILY 100 each 4  . rosuvastatin (CRESTOR) 10 MG tablet Take 1 tablet (10 mg total) by mouth at bedtime. 90 tablet 1  . triamterene-hydrochlorothiazide (MAXZIDE-25) 37.5-25 MG tablet Take 1 tablet by mouth daily. 30 tablet 2   No current facility-administered medications on file prior to visit.    ROS as in subjective   Objective: BP (!) 164/86   Pulse 97   Temp 98.2 F (36.8 C) (Oral)  Ht 5\' 1"  (1.549 m)   Wt 153 lb 3.2 oz (69.5 kg)   LMP 09/13/2015   SpO2 97%   BMI 28.95 kg/m      Vitals:   10/30/17 1259  BP: (!) 164/86  Pulse: 97  Temp: 98.2 F (36.8 C)  SpO2: 97%    General appearance: alert, no distress, WD/WN,  HEENT: normocephalic, sclerae anicteric, PERRLA, EOMi, nares patent, no discharge or erythema, pharynx normal Oral cavity: MMM, no lesions Neck: supple, there is a small tender palpable subcentimeter lymph node of the left posterior neck, otherwise no lymphadenopathy, no thyromegaly, no masses Heart: RRR, normal S1, S2, no murmurs Lungs: CTA bilaterally, no wheezes, rhonchi, or rales Abdomen: +bs, soft, non tender, non distended, no masses, no hepatomegaly, no splenomegaly Back: Tender in general, decreased range of motion due to pain Musculoskeletal: Legs nontender, no swelling, no obvious deformity Extremities: no  edema, no cyanosis, no clubbing Pulses: 2+ symmetric, upper and lower extremities, normal cap refill Neurological: alert, oriented x 3, CN2-12 intact, strength normal upper extremities and lower extremities, sensation normal throughout, DTRs 2+ throughout, no cerebellar signs, gait normal Psychiatric: normal affect, behavior normal, pleasant    Assessment: Encounter Diagnoses  Name Primary?  . Mass of left side of neck Yes  . Chronic bilateral low back pain with right-sided sciatica   . Spinal stenosis, unspecified spinal region   . Viral syndrome   . Loose stools   . Cough   . Lymphadenitis   . Localized swelling, mass or lump of neck   . Thyroid nodule   . Essential hypertension   . Allergic reaction, initial encounter     Plan:  Mass of left posterior neck -she finally agrees to CT scan.  We will set this up  From the ultrasound we had recently of the thyroid, there was a nodule that was recommended to have biopsy but she continues to decline biopsy  Chronic back pain, spinal stenosis-referral to back specialist  Her current symptoms suggest viral syndrome, lymphadenitis, cough, congestion-discussed supportive care, rest, hydration.  I advised she not be around her sister with chemo until she is much better.  We discussed handwashing and hygiene.  Hypertension- she has recently started back on her medicine.  We discussed again the importance of compliance.  I recommend we add on a second medication but she refuses that today.  We discussed the risk of uncontrolled high blood pressure.  Allergic reaction- she has a localized rash in circular finding where she has been using the freestyle libre glucometer.  But is only on the right arm.  Advised to not use the monitor on the arm and for now can use triamcinolone below to help resolve the rash.  Advise she report this to the pharmacy as well.  Kynley was seen today for mass, diarrhea and nasal congestion.  Diagnoses and all orders  for this visit:  Mass of left side of neck  Chronic bilateral low back pain with right-sided sciatica -     Ambulatory referral to Spine Surgery  Spinal stenosis, unspecified spinal region -     Ambulatory referral to Spine Surgery  Viral syndrome  Loose stools  Cough  Lymphadenitis  Localized swelling, mass or lump of neck -     CT SOFT TISSUE NECK WO CONTRAST; Future  Thyroid nodule  Essential hypertension  Allergic reaction, initial encounter  Other orders -     triamcinolone cream (KENALOG) 0.1 %; Apply 1 application topically 2 (two)  times daily.

## 2017-10-30 NOTE — Telephone Encounter (Signed)
Please call and set her up CT neck without contrast to further evaluate neck mass.  I prefer no contrast that she has kidney disease  Also referred to a back specialist to either [facility] orthopedist or [facility] Char make sure we send a copy of her 2017 the lumbar MRI

## 2017-10-31 ENCOUNTER — Other Ambulatory Visit: Payer: Self-pay | Admitting: Medical

## 2017-11-03 NOTE — Telephone Encounter (Signed)
This was done today and I was able to get this approved  Patient is scheduled for CT soft tissue of neck Tuesday November 04, 2017 at 8:45 at [place]  Approval # [id number] Exp 12-02-2017 [phone]    Faxed order to [facility] and Patient is aware of appointment

## 2017-11-04 ENCOUNTER — Other Ambulatory Visit: Payer: Self-pay | Admitting: Medical

## 2017-11-04 NOTE — Telephone Encounter (Signed)
Pt called and stated she needs a new prescription for freestyle 14 day meter and sensor is 14 day as well. Needs a refill on prodigy test strips and lancets.

## 2017-11-04 NOTE — Telephone Encounter (Signed)
Pt called to f/u on referral to back specialist. Se has been expecting a call about seeing a back specialist and pt is in a lot of pain.

## 2017-11-05 ENCOUNTER — Other Ambulatory Visit: Payer: Self-pay | Admitting: Medical

## 2017-11-05 MED ORDER — FREESTYLE LIBRE SENSOR SYSTEM MISC: 3 refills | Status: DC

## 2017-11-05 MED ORDER — GLUCOSE BLOOD VI STRP: ORAL_STRIP | 12 refills | Status: AC

## 2017-11-05 MED ORDER — PRODIGY TWIST TOP LANCETS 28G MISC: 11 refills | Status: DC

## 2017-11-05 NOTE — Telephone Encounter (Signed)
Yes she needs 14 day system of freestyle libre meter and sensor both also needs prodigy test strips and lancets.

## 2017-11-05 NOTE — Telephone Encounter (Signed)
Is she still using the glucometer freestyle libre or what does she need refilled?

## 2017-11-10 ENCOUNTER — Ambulatory Visit (INDEPENDENT_AMBULATORY_CARE_PROVIDER_SITE_OTHER): Payer: BLUE CROSS/BLUE SHIELD | Admitting: Specialist

## 2017-11-10 ENCOUNTER — Other Ambulatory Visit: Payer: Self-pay | Admitting: Medical

## 2017-11-10 MED ORDER — FREESTYLE LIBRE 14 DAY READER DEVI: 1.0000 | 2 refills | Status: DC

## 2017-11-11 ENCOUNTER — Telehealth: Payer: Self-pay | Admitting: Family Medicine

## 2017-11-11 NOTE — Telephone Encounter (Signed)
I called Tina Horton and they stated that they had figured out the directions.

## 2017-11-11 NOTE — Telephone Encounter (Signed)
[facility] [address]   [place] no coding test stripst #100   Test 2 times daily

## 2017-11-13 ENCOUNTER — Telehealth: Payer: Self-pay | Admitting: Medical

## 2017-11-13 NOTE — Telephone Encounter (Signed)
Reordered PT ASSISTANCE LANTUS

## 2017-11-14 ENCOUNTER — Ambulatory Visit (INDEPENDENT_AMBULATORY_CARE_PROVIDER_SITE_OTHER): Payer: Self-pay

## 2017-11-14 ENCOUNTER — Telehealth (INDEPENDENT_AMBULATORY_CARE_PROVIDER_SITE_OTHER): Payer: Self-pay | Admitting: Specialist

## 2017-11-14 ENCOUNTER — Encounter (INDEPENDENT_AMBULATORY_CARE_PROVIDER_SITE_OTHER): Payer: Self-pay | Admitting: Specialist

## 2017-11-14 ENCOUNTER — Ambulatory Visit (INDEPENDENT_AMBULATORY_CARE_PROVIDER_SITE_OTHER): Payer: BLUE CROSS/BLUE SHIELD | Admitting: Specialist

## 2017-11-14 VITALS — BP 171/89 | HR 92 | Ht 61.0 in | Wt 153.0 lb

## 2017-11-14 DIAGNOSIS — M5136 Other intervertebral disc degeneration, lumbar region: Secondary | ICD-10-CM

## 2017-11-14 DIAGNOSIS — M48062 Spinal stenosis, lumbar region with neurogenic claudication: Secondary | ICD-10-CM | POA: Diagnosis not present

## 2017-11-14 DIAGNOSIS — M533 Sacrococcygeal disorders, not elsewhere classified: Secondary | ICD-10-CM | POA: Diagnosis not present

## 2017-11-14 DIAGNOSIS — M47816 Spondylosis without myelopathy or radiculopathy, lumbar region: Secondary | ICD-10-CM

## 2017-11-14 DIAGNOSIS — N135 Crossing vessel and stricture of ureter without hydronephrosis: Secondary | ICD-10-CM | POA: Diagnosis not present

## 2017-11-14 DIAGNOSIS — M545 Low back pain: Secondary | ICD-10-CM | POA: Diagnosis not present

## 2017-11-14 MED ORDER — METHYLPREDNISOLONE 4 MG PO TABS: ORAL_TABLET | ORAL | 0 refills | Status: DC

## 2017-11-14 MED ORDER — TRAMADOL-ACETAMINOPHEN 37.5-325 MG PO TABS: 1.0000 | ORAL_TABLET | Freq: Four times a day (QID) | ORAL | 0 refills | Status: DC | PRN

## 2017-11-14 NOTE — Patient Instructions (Signed)
Avoid bending, stooping and avoid lifting weights greater than 10 lbs. Avoid prolong standing and walking. Avoid frequent bending and stooping  No lifting greater than 10 lbs. May use ice or moist heat for pain. Weight loss is of benefit. Handicap license is approved. Stationary bicycle and pool walking exercise.

## 2017-11-14 NOTE — Progress Notes (Signed)
Office Visit Note   Patient: Tina Horton           Date of Birth: 13-Jul-1965           MRN: [id number] Visit Date: 11/14/2017              Requested by: Carlena Hurl, PA-C [address] [place], [place] [address] PCP: Carlena Hurl, PA-C   Assessment & Plan: Visit Diagnoses:  1. Low back pain, unspecified back pain laterality, unspecified chronicity, with sciatica presence unspecified   2. Degenerative disc disease, lumbar   3. Spondylosis without myelopathy or radiculopathy, lumbar region   4. Sacroiliac dysfunction   5. Spinal stenosis of lumbar region with neurogenic claudication     Plan: Avoid bending, stooping and avoid lifting weights greater than 10 lbs. Avoid prolong standing and walking. Avoid frequent bending and stooping  No lifting greater than 10 lbs. May use ice or moist heat for pain. Weight loss is of benefit. Handicap license is approved. Stationary bicycle and pool walking exercise.  Follow-Up Instructions: Return in about 1 month (around 12/12/2017).   Orders:  Orders Placed This Encounter  Procedures  . XR Lumbar Spine 2-3 Views   No orders of the defined types were placed in this encounter.     Procedures: No procedures performed   Clinical Data: No additional findings.   Subjective: Chief Complaint  Patient presents with  . Lower Back - Pain    52 year old female with history of previous TVH without oopherectomy, Dr.Evelyn Vannatto. Reports was told that pre and post op there may be nerve injury  Concerns remove uterus due to prolapse and fibroids. Post surgery the prolapse and fribroid pain was better. It is different to this pain in the right upper thigh And back spasm associated with activity. Back spasm with pai shooting down the right side. Lifting and bending constant pain like a toothache. She uses a heating pad No pain meds due to kidney disfunction told by Dr. 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  Has seen a neurologist in the past And complaints of right hip popping and catching with severe pain. Left back pain occasionally, but not as bad and mostly in the back on the left side. Pain on the right Side radiates down to the right heel. Has seen a Dr. Jimmye Norman, [place] in the past was told she had subluxation and a disc herniation. She has had the right sciatica for years. No bladder difficulty, She does not go to move her bowel 3 times a day as in the past. She is able to walk 3 miles per day and now is going to restart. Stopped  due to pain in the back and right leg.  Walking and lifting, she is legally blind and has not work since 2014.    Review of Systems  Constitutional: Positive for activity change. Negative for fever.  HENT: Positive for congestion, ear pain, rhinorrhea, sinus pressure and sinus pain. Negative for facial swelling, hearing loss, mouth sores, nosebleeds, postnasal drip, sneezing, sore throat, tinnitus, trouble swallowing and voice change.   Eyes: Positive for pain, redness, itching and visual disturbance.  Respiratory: Positive for cough. Negative for apnea, choking, chest tightness, shortness of breath, wheezing and stridor.   Cardiovascular: Negative.  Negative for chest pain, palpitations and leg swelling.  Gastrointestinal: Positive for abdominal distention, abdominal pain, nausea and vomiting. Negative for anal bleeding, blood in stool,  constipation, diarrhea and rectal pain.  Endocrine: Negative.  Negative for cold intolerance, heat intolerance, polydipsia, polyphagia and polyuria.  Genitourinary: Negative.  Negative for difficulty urinating, dyspareunia, dysuria, enuresis, frequency and pelvic pain.  Musculoskeletal: Positive for back pain and gait problem. Negative for arthralgias, joint swelling, myalgias, neck pain and neck stiffness.  Skin: Negative.  Negative for color change, pallor, rash and wound.  Allergic/Immunologic: Negative.   Negative for environmental allergies, food allergies and immunocompromised state.  Neurological: Negative for dizziness, tremors, seizures, syncope, facial asymmetry, speech difficulty, weakness, light-headedness, numbness and headaches.  Hematological: Negative.  Negative for adenopathy. Does not bruise/bleed easily.  Psychiatric/Behavioral: Negative.  Negative for agitation, behavioral problems, confusion, decreased concentration, dysphoric mood, hallucinations, self-injury, sleep disturbance and suicidal ideas. The patient is not nervous/anxious and is not hyperactive.      Objective: Vital Signs: BP (!) 171/89 (BP Location: Right Arm, Patient Position: Sitting)   Pulse 92   Ht 5\' 1"  (1.549 m)   Wt 153 lb (69.4 kg)   LMP 09/13/2015   BMI 28.91 kg/m   Physical Exam  Constitutional: She is oriented to person, place, and time. She appears well-developed and well-nourished.  HENT:  Head: Normocephalic and atraumatic.  Eyes: Pupils are equal, round, and reactive to light. EOM are normal.  Neck: Normal range of motion. Neck supple.  Pulmonary/Chest: Effort normal and breath sounds normal.  Abdominal: Soft. Bowel sounds are normal.  Musculoskeletal: Normal range of motion.  Neurological: She is alert and oriented to person, place, and time.  Skin: Skin is warm and dry.  Psychiatric: She has a normal mood and affect. Her behavior is normal. Judgment and thought content normal.    Back Exam   Tenderness  The patient is experiencing tenderness in the lumbar.  Range of Motion  Extension: 30  Flexion: 80  Lateral bend right: normal  Lateral bend left: normal  Rotation right: normal  Rotation left: normal   Muscle Strength  The patient has normal back strength. Right Quadriceps:  5/5  Left Quadriceps:  5/5  Right Hamstrings:  5/5  Left Hamstrings:  5/5   Tests  Straight leg raise right: negative Straight leg raise left: negative  Reflexes  Patellar:  1/4 normal Achilles:   1/4 normal Babinski's sign: normal   Other  Toe walk: normal Heel walk: normal Sensation: normal Gait: normal  Erythema: no back redness Scars: absent  Comments:  Motor is normal      Specialty Comments:  No specialty comments available.  Imaging: Xr Lumbar Spine 2-3 Views  Result Date: 11/14/2017 AP and lateral flexion and extension radiographs show Moderately severe lumbar disc narrowing L4-5 and L5-S1 with narrowing of the neuroforamen on lateral view. Bilateral Sacroiliac degenerative changes confirmed on the CT Scan from 2017. Minimal retrolisthesis.    PMFS History: Patient Active Problem List   Diagnosis Date Noted  . Localized swelling, mass or lump of neck 10/30/2017  . Microalbuminuria 08/26/2017  . Mass of left side of neck 08/26/2017  . Screening for cancer 08/20/2017  . Chronic back pain 08/20/2017  . Left leg pain 08/20/2017  . Respiratory tract infection 08/20/2017  . Nausea 08/20/2017  . Renal cyst 01/15/2016  . Hydronephrosis 01/15/2016  . Spinal stenosis 01/15/2016  . Anemia, iron deficiency 12/04/2015  . History of burning pain in leg 12/04/2015  . Bilateral low back pain without sciatica 12/04/2015  . S/P laparoscopic assisted vaginal hysterectomy (LAVH) 09/13/2015  . Encounter for health maintenance examination in adult  08/21/2015  . Hyperlipidemia 08/21/2015  . Glaucoma 08/21/2015  . Heart murmur 08/21/2015  . Thyroid nodule 08/21/2015  . Vaccine refused by patient 08/21/2015  . Noncompliance 03/21/2015  . Essential hypertension 03/21/2015  . Uncontrolled diabetes mellitus with complication, with long-term current use of insulin ([place]) 03/21/2015  . Lymphadenitis 03/21/2015  . SKIN LESION 10/19/2008  . SYSTOLIC MURMUR 77/03/6578  . GLAUCOMA 07/17/2006  . VISUAL DISTURBANCE NOS 07/17/2006  . REFLUX ESOPHAGITIS 07/17/2006   Past Medical History:  Diagnosis Date  . Anemia   . Arthritis    knees  . Bronchitis   . Diabetes mellitus  without complication [facility])    age 75yo  . GERD (gastroesophageal reflux disease)   . Glaucoma    Dr. Einar Gip  . H/O mammogram 2005  . Headache   . Heart murmur   . Hyperlipidemia   . Hypertension   . Intermittent palpitations   . Legally blind   . Myopia   . Pneumonia   . Routine gynecological examination    last pap 2005  . Seasonal allergies   . Shortness of breath dyspnea   . Sinusitis   . Thyroid nodule     Family History  Problem Relation Age of Onset  . Diabetes Mother   . Hypertension Mother   . Stroke Mother   . Hypertension Father   . Chronic Renal Failure Father   . Hypertension Sister   . Hypertension Brother   . Stroke Maternal Grandmother   . Asthma Daughter   . Healthy Daughter   . Healthy Son   . Cancer Neg Hx   . Heart disease Neg Hx     Past Surgical History:  Procedure Laterality Date  . CYSTECTOMY     umbilicus  . INCISION AND DRAINAGE     abdominal superficial abscess  . LAPAROSCOPIC VAGINAL HYSTERECTOMY WITH SALPINGECTOMY Bilateral 09/13/2015   Procedure: LAPAROSCOPIC ASSISTED VAGINAL HYSTERECTOMY WITH SALPINGECTOMY, [facility] Colpoplasty;  Surgeon: Thurnell Lose, MD;  Location: [facility];  Service: Gynecology;  Laterality: Bilateral;  . LASIK     x2   Social History   Occupational History  . Not on file  Tobacco Use  . Smoking status: Never Smoker  . Smokeless tobacco: Never Used  Substance and Sexual Activity  . Alcohol use: No    Alcohol/week: 0.0 oz  . Drug use: No  . Sexual activity: Not on file

## 2017-11-14 NOTE — Telephone Encounter (Signed)
Patient called stating that Dr. Louanne Skye would give her the handicap placard.  She would like to come pick that up on Monday.  CB#[phone].  Thank you.

## 2017-11-14 NOTE — Telephone Encounter (Signed)
Patient left a message stating that she needed to ask question that she forgot to ask during her visit.  CB#[phone].  Thank you.

## 2017-11-17 NOTE — Telephone Encounter (Signed)
Pt is aware if is ready for pick up

## 2017-11-17 NOTE — Telephone Encounter (Signed)
See other mesage

## 2017-11-18 ENCOUNTER — Other Ambulatory Visit: Payer: Self-pay

## 2017-11-18 LAB — ANA: Anti Nuclear Antibody(ANA): POSITIVE — AB

## 2017-11-18 LAB — ANTI-NUCLEAR AB-TITER (ANA TITER): ANA Titer 1: 1:640 {titer} — ABNORMAL HIGH

## 2017-11-18 LAB — URIC ACID: Uric Acid, Serum: 6.2 mg/dL (ref 2.5–7.0)

## 2017-11-18 LAB — SEDIMENTATION RATE: Sed Rate: 39 mm/h — ABNORMAL HIGH (ref 0–30)

## 2017-11-18 LAB — RHEUMATOID FACTOR: Rhuematoid fact SerPl-aCnc: <14 IU/mL (ref <14)

## 2017-11-18 LAB — VITAMIN D 25 HYDROXY (VIT D DEFICIENCY, FRACTURES): Vit D, 25-Hydroxy: 15 ng/mL — ABNORMAL LOW (ref 30–100)

## 2017-11-18 MED ORDER — PRODIGY TWIST TOP LANCETS 28G MISC: 0 refills | Status: DC

## 2017-11-18 MED ORDER — INSULIN PEN NEEDLE 32G X 6 MM MISC: 0 refills | Status: DC

## 2017-11-18 MED ORDER — GLUCOSE BLOOD VI STRP: ORAL_STRIP | 0 refills | Status: DC

## 2017-11-18 MED ORDER — INSULIN GLARGINE 100 UNIT/ML SOLOSTAR PEN: 40.0000 [IU] | PEN_INJECTOR | Freq: Every day | SUBCUTANEOUS | 1 refills | Status: DC

## 2017-11-18 MED ORDER — INSULIN ASPART 100 UNIT/ML FLEXPEN: 15.0000 [IU] | PEN_INJECTOR | Freq: Three times a day (TID) | SUBCUTANEOUS | 0 refills | Status: DC

## 2017-11-18 NOTE — Telephone Encounter (Signed)
Pt will be see an endocrinology

## 2017-11-18 NOTE — Telephone Encounter (Signed)
Ok to refill her glucometer testing supplies.  Please call out if not done already

## 2017-11-19 NOTE — Telephone Encounter (Signed)
Pt Assistance Lantus recv'd, left message for pt

## 2017-11-21 ENCOUNTER — Other Ambulatory Visit (INDEPENDENT_AMBULATORY_CARE_PROVIDER_SITE_OTHER): Payer: Self-pay | Admitting: Specialist

## 2017-11-21 DIAGNOSIS — M461 Sacroiliitis, not elsewhere classified: Secondary | ICD-10-CM

## 2017-11-21 DIAGNOSIS — R768 Other specified abnormal immunological findings in serum: Secondary | ICD-10-CM

## 2017-11-21 DIAGNOSIS — M47819 Spondylosis without myelopathy or radiculopathy, site unspecified: Secondary | ICD-10-CM

## 2017-11-24 ENCOUNTER — Telehealth: Payer: Self-pay | Admitting: Medical

## 2017-11-24 ENCOUNTER — Telehealth (INDEPENDENT_AMBULATORY_CARE_PROVIDER_SITE_OTHER): Payer: Self-pay | Admitting: Specialist

## 2017-11-24 NOTE — Telephone Encounter (Signed)
Patient said the prednisone caused her blood sugar to jump over 300. Is there something else she can take? Patient CB # [phone]

## 2017-11-24 NOTE — Telephone Encounter (Signed)
Patient said the prednisone caused her blood sugar to jump over 300. Is there something else she can take?

## 2017-11-24 NOTE — Telephone Encounter (Signed)
Pt called needs refill Novolog and needles,  [facility] t# [phone] and reordered.  Pt will need to complete application for any further orders.  Pt notified

## 2017-11-24 NOTE — Telephone Encounter (Signed)
Probably should stop the dose pak and take just one tablet per day till out. I called her and told her to change to taking just one tablet a day till out. She reports the steriod is helping to decrease the hip pain. jen

## 2017-11-25 NOTE — Telephone Encounter (Signed)
Ok noted  

## 2017-12-04 ENCOUNTER — Telehealth: Payer: Self-pay

## 2017-12-04 NOTE — Telephone Encounter (Signed)
Pt called because she wants to know if there is another place on her skin that recommend she place her freestyle. She stated she's tried it on both arms and it has started causing a skin reaction. Please advise.

## 2017-12-05 NOTE — Telephone Encounter (Signed)
Have her speak to her pharmacist about ways to work with this.  Sometimes people just have allergies to adhesions in these devices and she may not be able to use it is going forward but have her speak to the pharmacist or she could even call the company who makes this and get their recommendations  The freestyle libre manufacturer [employer]'s phone number is as follows [phone].

## 2017-12-05 NOTE — Telephone Encounter (Signed)
Pt stated she had already contacted the manufacture whom told her to contact her pcp. Patient was informed of providers note and stated she would stop using freestyle because she has an allergy to the adhesive.

## 2017-12-05 NOTE — Progress Notes (Signed)
Office Visit Note  Patient: JACQUELENE KOPECKY             Date of Birth: 1966-03-02           MRN: [id number]             PCP: Carlena Hurl, PA-C Referring: Jessy Oto, MD Visit Date: 12/19/2017 Occupation: _0 @  Subjective: Abnormal lab   History of Present Illness: Tina Horton is a 52 y.o. female seen in consultation per request of Dr. Louanne Skye for evaluation of positive ANA and elevated ESR.  According to patient in 2017 she underwent hysterectomy after that she started having burning sensation in her right thigh.  She was referred to a neurologist who did tolerate evaluation and an MRI of her lumbar spine which was consistent with degenerative disease of lumbar spine and spinal stenosis.  She was referred to Dr. Louanne Skye and he advised exercises.  She also mentioned joint pain in her elbows and knee joints.  He did some lab work which came positive for ANA and elevated ESR for that reason she was referred to me.  Patient states that she has some discomfort in her elbows and knee joints.  She denies any joint swelling.  Activities of Daily Living:  Patient reports morning stiffness for 0 minute.   Patient Reports nocturnal pain.  Difficulty dressing/grooming: Denies Difficulty climbing stairs: Reports Difficulty getting out of chair: Reports Difficulty using hands for taps, buttons, cutlery, and/or writing: Denies  Review of Systems  Constitutional: Negative for fatigue, night sweats, weight gain and weight loss.  HENT: Negative for mouth sores, trouble swallowing, trouble swallowing, mouth dryness and nose dryness.   Eyes: Positive for dryness. Negative for pain, redness and visual disturbance.  Respiratory: Negative for cough, shortness of breath and difficulty breathing.   Cardiovascular: Positive for palpitations. Negative for chest pain, hypertension, irregular heartbeat and swelling in legs/feet.       Cardiology w/u negative per pt.  Gastrointestinal: Negative for  blood in stool, constipation and diarrhea.  Endocrine: Negative for increased urination.  Genitourinary: Negative for vaginal dryness.  Musculoskeletal: Positive for arthralgias and joint pain. Negative for joint swelling, myalgias, muscle weakness, morning stiffness, muscle tenderness and myalgias.  Skin: Negative for color change, rash, hair loss, skin tightness, ulcers and sensitivity to sunlight.  Allergic/Immunologic: Negative for susceptible to infections.  Neurological: Negative for dizziness, memory loss, night sweats and weakness.  Hematological: Negative for swollen glands.  Psychiatric/Behavioral: Negative for depressed mood and sleep disturbance. The patient is not nervous/anxious.     PMFS History:  Patient Active Problem List   Diagnosis Date Noted  . Localized swelling, mass or lump of neck 10/30/2017  . Microalbuminuria 08/26/2017  . Mass of left side of neck 08/26/2017  . Screening for cancer 08/20/2017  . Chronic back pain 08/20/2017  . Left leg pain 08/20/2017  . Respiratory tract infection 08/20/2017  . Nausea 08/20/2017  . Renal cyst 01/15/2016  . Hydronephrosis 01/15/2016  . Spinal stenosis 01/15/2016  . Anemia, iron deficiency 12/04/2015  . History of burning pain in leg 12/04/2015  . Bilateral low back pain without sciatica 12/04/2015  . S/P laparoscopic assisted vaginal hysterectomy (LAVH) 09/13/2015  . Encounter for health maintenance examination in adult 08/21/2015  . Hyperlipidemia 08/21/2015  . Glaucoma 08/21/2015  . Heart murmur 08/21/2015  . Thyroid nodule 08/21/2015  . Vaccine refused by patient 08/21/2015  . Noncompliance 03/21/2015  . Essential hypertension 03/21/2015  . Uncontrolled diabetes mellitus  with complication, with long-term current use of insulin ([place]) 03/21/2015  . Lymphadenitis 03/21/2015  . SKIN LESION 10/19/2008  . SYSTOLIC MURMUR 33/82/5053  . GLAUCOMA 07/17/2006  . VISUAL DISTURBANCE NOS 07/17/2006  . REFLUX ESOPHAGITIS  07/17/2006    Past Medical History:  Diagnosis Date  . Anemia   . Arthritis    knees  . Bronchitis   . Diabetes mellitus without complication [facility])    age 22yo  . GERD (gastroesophageal reflux disease)   . Glaucoma    Dr. Einar Gip  . H/O mammogram 2005  . Headache   . Heart murmur   . Hyperlipidemia   . Hypertension   . Intermittent palpitations   . Legally blind   . Myopia   . Pneumonia   . Routine gynecological examination    last pap 2005  . Seasonal allergies   . Shortness of breath dyspnea   . Sinusitis   . Thyroid nodule     Family History  Problem Relation Age of Onset  . Diabetes Mother   . Hypertension Mother   . Stroke Mother   . Hypertension Father   . Chronic Renal Failure Father   . Hypertension Sister   . Hypertension Brother   . Stroke Maternal Grandmother   . Asthma Daughter   . Healthy Daughter   . Healthy Son   . Cancer Neg Hx   . Heart disease Neg Hx    Past Surgical History:  Procedure Laterality Date  . CYSTECTOMY     umbilicus  . INCISION AND DRAINAGE     abdominal superficial abscess  . LAPAROSCOPIC VAGINAL HYSTERECTOMY WITH SALPINGECTOMY Bilateral 09/13/2015   Procedure: LAPAROSCOPIC ASSISTED VAGINAL HYSTERECTOMY WITH SALPINGECTOMY, McCalls Colpoplasty;  Surgeon: Thurnell Lose, MD;  Location: [facility];  Service: Gynecology;  Laterality: Bilateral;  . LASIK     x2   Social History   Social History Narrative   Married, has 3 children, not exercising, but walks at work.     Lives in a one story home.     On disability.  Education: some college.    Objective: Vital Signs: BP (!) 184/91 (BP Location: Left Arm, Patient Position: Sitting, Cuff Size: Normal)   Pulse 84   Ht _0  (1.549 m)   Wt 152 lb (68.9 kg)   LMP 09/13/2015   BMI 28.72 kg/m    Physical Exam  Constitutional: She is oriented to person, place, and time. She appears well-developed and well-nourished.  HENT:  Head: Normocephalic and atraumatic.  Eyes:  Conjunctivae and EOM are normal.  Neck: Normal range of motion.  Cardiovascular: Normal rate, regular rhythm, normal heart sounds and intact distal pulses.  Pulmonary/Chest: Effort normal and breath sounds normal.  Abdominal: Soft. Bowel sounds are normal.  Lymphadenopathy:    She has no cervical adenopathy.  Neurological: She is alert and oriented to person, place, and time.  Skin: Skin is warm and dry. Capillary refill takes less than 2 seconds.  Psychiatric: She has a normal mood and affect. Her behavior is normal.  Nursing note and vitals reviewed.    Musculoskeletal Exam: C-spine thoracic spine good range of motion.  She has limited painful range of motion of her lumbar spine.  Shoulder joints elbow joints wrist joint MCPs PIPs DIPs were in good range of motion.  She has some DIP PIP thickening in her hands consistent with osteoarthritis.  Hip joints knee joints ankles MTPs PIPs were in good range of motion.  She has been full  range of motion of bilateral knee joints without any warmth swelling or effusion.  CDAI Exam: No CDAI exam completed.   Investigation: Findings:  11/14/2017: Vitamin D 15, RF < 14, sed rate 39, uric acid 6.2  Component     Latest Ref Rng & Units 11/14/2017  ANA Pattern 1      NUCLEOLAR (A)  ANA Titer 1     titer 1:640 (H)  Vitamin D, 25-Hydroxy     30 - 100 ng/mL 15 (L)  Anti Nuclear Antibody(ANA)     NEGATIVE POSITIVE (A)  RA Latex Turbid.     <14 IU/mL <14  Sed Rate     0 - 30 mm/h 39 (H)  Uric Acid, Serum     2.5 - 7.0 mg/dL 6.2   Imaging: Xr Knee 3 View Left  Result Date: 12/19/2017 Moderate medial compartment narrowing with lateral osteophytes was noted.  Moderate patellofemoral narrowing was noted.  No chondrocalcinosis was noted. Impression: These findings are consistent with moderate osteoarthritis and moderate chondromalacia patella.  Xr Knee 3 View Right  Result Date: 12/19/2017 Mild lateral compartment narrowing and intercondylar  osteophytes were noted.  Mild patellofemoral narrowing was noted. Impression: These findings are considered mild osteoarthritis and mild chondromalacia patella.   Recent Labs: Lab Results  Component Value Date   WBC 5.6 08/20/2017   HGB 13.9 08/20/2017   PLT 401 (H) 08/20/2017   NA 141 10/24/2017   K 3.7 10/24/2017   CL 99 10/24/2017   CO2 26 10/24/2017   GLUCOSE 120 (H) 10/24/2017   BUN 18 10/24/2017   CREATININE 0.96 10/24/2017   BILITOT 0.6 10/24/2017   ALKPHOS 91 10/24/2017   AST 16 10/24/2017   ALT 23 10/24/2017   PROT 7.3 10/24/2017   ALBUMIN 4.7 10/24/2017   CALCIUM 10.3 (H) 10/24/2017   GFRAA 79 10/24/2017    Speciality Comments: No specialty comments available.  Procedures:  No procedures performed Allergies: Amlodipine; Lisinopril; Metformin and related; and Tanzeum [albiglutide]   Assessment / Plan:     Visit Diagnoses: Positive ANA (antinuclear antibody) - 11/14/2017: Vitamin D 15, RF < 14, sed rate 39, uric acid 6.2, ANA 1:640 Nucleolar.  Patient has no clinical features of autoimmune disease.  She is quite concerned about the positive ANA.  I will obtainAVISE labs.  DDD (degenerative disc disease), lumbar with a spinal stenosis.  She has severe lower back pain.  She has been followed by Dr. Louanne Skye for that.  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 Minimal retrolisthesis.  She has chronic discomfort in her SI joints.  Chronic pain of both knees - Plan: XR KNEE 3 VIEW RIGHT, XR KNEE 3 VIEW LEFT.  She had no warmth swelling or effusion in her knee joints.  X-rays were consistent with mild to moderate osteoarthritis and chondromalacia patella.  Vitamin D deficiency-she has severe vitamin D deficiency.  I will place her on vitamin D 50,000 units twice a week for 3 months and recheck a vitamin D level in 3 months.  Essential hypertension-her blood pressure is quite high today.  She states  she has not been taking her blood pressure medications and will get the prescription.  Uncontrolled diabetes mellitus with complication, with long-term current use of insulin (HCC)  Hydronephrosis, unspecified hydronephrosis type  History of hyperlipidemia  History of glaucoma  Elevated sed rate - Plan: Sedimentation rate, Serum protein electrophoresis with reflex, IgG, IgA, IgM  Orders: Orders Placed This Encounter  Procedures  . XR KNEE 3 VIEW RIGHT  . XR KNEE 3 VIEW LEFT  . DG BONE DENSITY (DXA)  . Sedimentation rate  . Serum protein electrophoresis with reflex  . IgG, IgA, IgM   Meds ordered this encounter  Medications  . ergocalciferol (VITAMIN D2) 50000 units capsule    Sig: Take 1 capsule (50,000 Units total) by mouth 2 (two) times a week.    Dispense:  24 capsule    Refill:  0    Face-to-face time spent with patient was 50 minutes. Greater than 50% of time was spent in counseling and coordination of care.  Follow-Up Instructions: Return for Positive ANA, DDD.   Bo Merino, MD  Note - This record has been created using Editor, commissioning.  Chart creation errors have been sought, but may not always  have been located. Such creation errors do not reflect on  the standard of medical care.

## 2017-12-19 ENCOUNTER — Ambulatory Visit (INDEPENDENT_AMBULATORY_CARE_PROVIDER_SITE_OTHER): Payer: Self-pay

## 2017-12-19 ENCOUNTER — Encounter: Payer: Self-pay | Admitting: Rheumatology

## 2017-12-19 ENCOUNTER — Ambulatory Visit: Payer: BLUE CROSS/BLUE SHIELD | Admitting: Rheumatology

## 2017-12-19 VITALS — BP 184/91 | HR 84 | Ht 61.0 in | Wt 152.0 lb

## 2017-12-19 DIAGNOSIS — M25562 Pain in left knee: Secondary | ICD-10-CM

## 2017-12-19 DIAGNOSIS — E118 Type 2 diabetes mellitus with unspecified complications: Secondary | ICD-10-CM

## 2017-12-19 DIAGNOSIS — M25561 Pain in right knee: Secondary | ICD-10-CM | POA: Diagnosis not present

## 2017-12-19 DIAGNOSIS — I10 Essential (primary) hypertension: Secondary | ICD-10-CM

## 2017-12-19 DIAGNOSIS — Z794 Long term (current) use of insulin: Secondary | ICD-10-CM

## 2017-12-19 DIAGNOSIS — G8929 Other chronic pain: Secondary | ICD-10-CM

## 2017-12-19 DIAGNOSIS — R768 Other specified abnormal immunological findings in serum: Secondary | ICD-10-CM | POA: Diagnosis not present

## 2017-12-19 DIAGNOSIS — M51369 Other intervertebral disc degeneration, lumbar region without mention of lumbar back pain or lower extremity pain: Secondary | ICD-10-CM

## 2017-12-19 DIAGNOSIS — M5136 Other intervertebral disc degeneration, lumbar region: Secondary | ICD-10-CM

## 2017-12-19 DIAGNOSIS — IMO0002 Reserved for concepts with insufficient information to code with codable children: Secondary | ICD-10-CM

## 2017-12-19 DIAGNOSIS — M47818 Spondylosis without myelopathy or radiculopathy, sacral and sacrococcygeal region: Secondary | ICD-10-CM | POA: Diagnosis not present

## 2017-12-19 DIAGNOSIS — Z8639 Personal history of other endocrine, nutritional and metabolic disease: Secondary | ICD-10-CM

## 2017-12-19 DIAGNOSIS — M461 Sacroiliitis, not elsewhere classified: Secondary | ICD-10-CM

## 2017-12-19 DIAGNOSIS — R700 Elevated erythrocyte sedimentation rate: Secondary | ICD-10-CM

## 2017-12-19 DIAGNOSIS — E559 Vitamin D deficiency, unspecified: Secondary | ICD-10-CM

## 2017-12-19 DIAGNOSIS — Z8669 Personal history of other diseases of the nervous system and sense organs: Secondary | ICD-10-CM

## 2017-12-19 DIAGNOSIS — Z780 Asymptomatic menopausal state: Secondary | ICD-10-CM

## 2017-12-19 DIAGNOSIS — N1330 Unspecified hydronephrosis: Secondary | ICD-10-CM

## 2017-12-19 DIAGNOSIS — E1165 Type 2 diabetes mellitus with hyperglycemia: Secondary | ICD-10-CM

## 2017-12-19 DIAGNOSIS — R7689 Other specified abnormal immunological findings in serum: Secondary | ICD-10-CM

## 2017-12-19 MED ORDER — ERGOCALCIFEROL 1.25 MG (50000 UT) PO CAPS: 50000.0000 [IU] | ORAL_CAPSULE | ORAL | 0 refills | Status: DC

## 2017-12-19 NOTE — Patient Instructions (Signed)

## 2017-12-22 ENCOUNTER — Encounter: Payer: Self-pay | Admitting: Rheumatology

## 2017-12-23 LAB — PROTEIN ELECTROPHORESIS, SERUM, WITH REFLEX
Albumin ELP: 4.5 g/dL (ref 3.8–4.8)
Alpha 1: 0.3 g/dL (ref 0.2–0.3)
Alpha 2: 0.6 g/dL (ref 0.5–0.9)
Beta 2: 0.5 g/dL (ref 0.2–0.5)
Beta Globulin: 0.5 g/dL (ref 0.4–0.6)
Gamma Globulin: 0.9 g/dL (ref 0.8–1.7)
Total Protein: 7.3 g/dL (ref 6.1–8.1)

## 2017-12-23 LAB — IGG, IGA, IGM
IgG (Immunoglobin G), Serum: 956 mg/dL (ref 600–1640)
IgM, Serum: 164 mg/dL (ref 50–300)
Immunoglobulin A: 352 mg/dL — ABNORMAL HIGH (ref 47–310)

## 2017-12-23 LAB — SEDIMENTATION RATE: Sed Rate: 17 mm/h (ref 0–30)

## 2017-12-29 ENCOUNTER — Ambulatory Visit: Payer: Self-pay | Admitting: Medical

## 2017-12-31 ENCOUNTER — Telehealth: Payer: Self-pay | Admitting: Rheumatology

## 2017-12-31 NOTE — Telephone Encounter (Signed)
Patient called requesting a return call with the results of her bloodwork.

## 2017-12-31 NOTE — Telephone Encounter (Signed)
Advised patient that the labs will be discussed at the new patient follow up. Patient verbalized understanding.

## 2018-01-02 ENCOUNTER — Telehealth: Payer: Self-pay

## 2018-01-02 NOTE — Telephone Encounter (Signed)
Bone Density Results:   T-score: -0.9 BMD: 0.746  Advised patient of bone density results and she verbalized understanding.

## 2018-01-06 ENCOUNTER — Ambulatory Visit: Payer: BLUE CROSS/BLUE SHIELD | Admitting: Endocrinology

## 2018-01-07 ENCOUNTER — Telehealth: Payer: Self-pay | Admitting: *Deleted

## 2018-01-07 NOTE — Telephone Encounter (Signed)
Bone Density Scan results are normal. Patient may repeat in 5 years. Advised patient of results and recommendations. Patient verbalized understanding.

## 2018-01-14 ENCOUNTER — Other Ambulatory Visit: Payer: Self-pay | Admitting: Medical

## 2018-01-20 ENCOUNTER — Ambulatory Visit (INDEPENDENT_AMBULATORY_CARE_PROVIDER_SITE_OTHER): Payer: BLUE CROSS/BLUE SHIELD | Admitting: Medical

## 2018-01-20 VITALS — BP 160/90 | HR 80 | Temp 98.3°F | Resp 16 | Ht 61.0 in | Wt 151.8 lb

## 2018-01-20 DIAGNOSIS — R768 Other specified abnormal immunological findings in serum: Secondary | ICD-10-CM | POA: Insufficient documentation

## 2018-01-20 DIAGNOSIS — IMO0002 Reserved for concepts with insufficient information to code with codable children: Secondary | ICD-10-CM

## 2018-01-20 DIAGNOSIS — G8929 Other chronic pain: Secondary | ICD-10-CM | POA: Insufficient documentation

## 2018-01-20 DIAGNOSIS — E785 Hyperlipidemia, unspecified: Secondary | ICD-10-CM | POA: Diagnosis not present

## 2018-01-20 DIAGNOSIS — E118 Type 2 diabetes mellitus with unspecified complications: Secondary | ICD-10-CM | POA: Diagnosis not present

## 2018-01-20 DIAGNOSIS — M51369 Other intervertebral disc degeneration, lumbar region without mention of lumbar back pain or lower extremity pain: Secondary | ICD-10-CM | POA: Insufficient documentation

## 2018-01-20 DIAGNOSIS — R7689 Other specified abnormal immunological findings in serum: Secondary | ICD-10-CM | POA: Insufficient documentation

## 2018-01-20 DIAGNOSIS — M5136 Other intervertebral disc degeneration, lumbar region: Secondary | ICD-10-CM | POA: Insufficient documentation

## 2018-01-20 DIAGNOSIS — E1165 Type 2 diabetes mellitus with hyperglycemia: Secondary | ICD-10-CM

## 2018-01-20 DIAGNOSIS — Z9119 Patient's noncompliance with other medical treatment and regimen: Secondary | ICD-10-CM

## 2018-01-20 DIAGNOSIS — Z91199 Patient's noncompliance with other medical treatment and regimen due to unspecified reason: Secondary | ICD-10-CM

## 2018-01-20 DIAGNOSIS — M170 Bilateral primary osteoarthritis of knee: Secondary | ICD-10-CM | POA: Insufficient documentation

## 2018-01-20 DIAGNOSIS — M533 Sacrococcygeal disorders, not elsewhere classified: Secondary | ICD-10-CM

## 2018-01-20 DIAGNOSIS — N189 Chronic kidney disease, unspecified: Secondary | ICD-10-CM | POA: Diagnosis not present

## 2018-01-20 DIAGNOSIS — Z794 Long term (current) use of insulin: Secondary | ICD-10-CM

## 2018-01-20 DIAGNOSIS — I10 Essential (primary) hypertension: Secondary | ICD-10-CM | POA: Diagnosis not present

## 2018-01-20 DIAGNOSIS — R221 Localized swelling, mass and lump, neck: Secondary | ICD-10-CM

## 2018-01-20 DIAGNOSIS — E041 Nontoxic single thyroid nodule: Secondary | ICD-10-CM

## 2018-01-20 NOTE — Progress Notes (Signed)
Subjective: Chief Complaint  Patient presents with  . Diabetes    check med     Here for f/u.   Here with her husband.   She notes going to labor day cookout this weekend, ate some cake and pickles.  She attributes her elevated BP to this.  She notes that given her inability to drive, her poor vision and being limited to staying at home all the time or having to rely on husband all the time, she stress eats.    Since last visit here has established with [facility], nephrology, and sees them again this Friday.  Has also seen Dr. Estanislado Pandy, rheumatology since last visit here.   Being checked for RA and lupus.   Was referred to rheumatology by Dr. Louanne Skye orthopedist.    Had bone density scan per rheumatology recently.     Current medications: She is taking vitamin D 50,000 twice weekly. Novolog sliding scale TID,14u on average per meals, taking Lantus 40u QHS Compliant with Crestor 10mg , Aspirin 81mg  daily   Past Medical History:  Diagnosis Date  . Anemia   . Arthritis    knees  . Bronchitis   . Diabetes mellitus without complication [facility])    age 52yo  . GERD (gastroesophageal reflux disease)   . Glaucoma    Dr. Einar Gip  . H/O mammogram 2005  . Headache   . Heart murmur   . Hyperlipidemia   . Hypertension   . Intermittent palpitations   . Legally blind   . Myopia   . Pneumonia   . Routine gynecological examination    last pap 2005  . Seasonal allergies   . Shortness of breath dyspnea   . Sinusitis   . Thyroid nodule     Current Outpatient Medications on File Prior to Visit  Medication Sig Dispense Refill  . aspirin EC 81 MG tablet Take 1 tablet (81 mg total) by mouth daily. 90 tablet 3  . ergocalciferol (VITAMIN D2) 50000 units capsule Take 1 capsule (50,000 Units total) by mouth 2 (two) times a week. 24 capsule 0  . glucose blood (PRODIGY NO CODING BLOOD GLUC) test strip USE TO TEST BLOOD SUGAR TWICE DAILY 100 each 0  . glucose blood (PRODIGY NO CODING BLOOD  GLUC) test strip Use as instructed 100 each 12  . insulin aspart (NOVOLOG FLEXPEN) 100 UNIT/ML FlexPen Inject 15 Units into the skin 3 (three) times daily with meals. Use sliding scale we provided 30 mL 0  . Insulin Glargine (LANTUS) 100 UNIT/ML Solostar Pen Inject 40 Units into the skin daily at 10 pm. 6 mL 1  . Insulin Pen Needle (NOVOFINE) 32G X 6 MM MISC USE PEN NEEDLES FOR LANTUS SOLORSTAR PEN and novolog pens 200 each 0  . latanoprost (XALATAN) 0.005 % ophthalmic solution Place 1 drop into both eyes at bedtime.    [name] losartan (COZAAR) 25 MG tablet Take 25 mg by mouth daily.    [name] PRODIGY TWIST TOP LANCETS 28G MISC USE TO TEST BLOOD SUGAR ONCE OR TWICE DAILY 100 each 0  . PRODIGY TWIST TOP LANCETS 28G MISC USE TO TEST BLOOD SUGAR ONCE OR TWICE DAILY 100 each 11  . rosuvastatin (CRESTOR) 10 MG tablet Take 1 tablet (10 mg total) by mouth at bedtime. 90 tablet 1  . traMADol-acetaminophen (ULTRACET) 37.5-325 MG tablet Take 1 tablet by mouth every 6 (six) hours as needed. (Patient not taking: Reported on 01/20/2018) 30 tablet 0   No current facility-administered medications on file  prior to visit.    ROS as in subjective   Objective: BP (!) 160/90   Pulse 80   Temp 98.3 F (36.8 C) (Oral)   Resp 16   Ht 5\' 1"  (1.549 m)   Wt 151 lb 12.8 oz (68.9 kg)   LMP 09/13/2015   SpO2 98%   BMI 28.68 kg/m   BP Readings from Last 3 Encounters:  01/20/18 (!) 160/90  12/19/17 (!) 184/91  11/14/17 (!) 171/89   Wt Readings from Last 3 Encounters:  01/20/18 151 lb 12.8 oz (68.9 kg)  12/19/17 152 lb (68.9 kg)  11/14/17 153 lb (69.4 kg)   General appearence: alert, no distress, WD/WN,  Neck: supple, there is a fullness of left anterior neck just anterior  to mastoid process Heart: RRR, normal S1, S2, no murmurs Lungs: CTA bilaterally, no wheezes, rhonchi, or rales Ext: no edema Pulses: 2+ symmetric, upper and lower extremities, normal cap refill     Assessment: Encounter Diagnoses  Name  Primary?  . Essential hypertension Yes  . Chronic kidney disease, unspecified CKD stage   . Uncontrolled diabetes mellitus with complication, with long-term current use of insulin ([place])   . Hyperlipidemia, unspecified hyperlipidemia type   . Noncompliance   . Mass of neck   . Thyroid nodule      Plan: Request records today from nephrology reviewed recent rheumatology note and labs HTN - noncompliant with diet and low salt intake, refuses to add second anti-hypertensive medication today.   Advised f/u with nephrology on BP Diabetes - labs today Hyperlipidemia - c/t statin and aspirin Mass of neck, thyroid nodule - plan CT neck as she is agreeable now.  Consider biopsies as well.    Banessa was seen today for diabetes.  Diagnoses and all orders for this visit:  Essential hypertension -     Comprehensive metabolic panel -     Hemoglobin A1c  Chronic kidney disease, unspecified CKD stage  Uncontrolled diabetes mellitus with complication, with long-term current use of insulin (HCC) -     Hemoglobin A1c  Hyperlipidemia, unspecified hyperlipidemia type  Noncompliance  Mass of neck  Thyroid nodule  Follow-up pending labs and referrals

## 2018-01-20 NOTE — Progress Notes (Deleted)
Office Visit Note  Patient: Tina Horton             Date of Birth: 1966/03/02           MRN: [id number]             PCP: Carlena Hurl, PA-C Referring: Carlena Hurl, PA-C Visit Date: 02/03/2018 Occupation: '@GUAROCC'$ @  Subjective:  No chief complaint on file.   History of Present Illness: Tina Horton is a 52 y.o. female ***   Activities of Daily Living:  Patient reports morning stiffness for *** {minute/hour:19697}.   Patient {ACTIONS;DENIES/REPORTS:21021675::"Denies"} nocturnal pain.  Difficulty dressing/grooming: {ACTIONS;DENIES/REPORTS:21021675::"Denies"} Difficulty climbing stairs: {ACTIONS;DENIES/REPORTS:21021675::"Denies"} Difficulty getting out of chair: {ACTIONS;DENIES/REPORTS:21021675::"Denies"} Difficulty using hands for taps, buttons, cutlery, and/or writing: {ACTIONS;DENIES/REPORTS:21021675::"Denies"}  No Rheumatology ROS completed.   PMFS History:  Patient Active Problem List   Diagnosis Date Noted  . Chronic kidney disease 01/20/2018  . Primary osteoarthritis of both knees 01/20/2018  . DDD (degenerative disc disease), lumbar 01/20/2018  . Chronic SI joint pain 01/20/2018  . Localized swelling, mass or lump of neck 10/30/2017  . Microalbuminuria 08/26/2017  . Mass of neck 08/26/2017  . Screening for cancer 08/20/2017  . Chronic back pain 08/20/2017  . Left leg pain 08/20/2017  . Respiratory tract infection 08/20/2017  . Nausea 08/20/2017  . Renal cyst 01/15/2016  . Hydronephrosis 01/15/2016  . Spinal stenosis 01/15/2016  . Anemia, iron deficiency 12/04/2015  . History of burning pain in leg 12/04/2015  . Bilateral low back pain without sciatica 12/04/2015  . S/P laparoscopic assisted vaginal hysterectomy (LAVH) 09/13/2015  . Encounter for health maintenance examination in adult 08/21/2015  . Hyperlipidemia 08/21/2015  . Glaucoma 08/21/2015  . Heart murmur 08/21/2015  . Thyroid nodule 08/21/2015  . Vaccine refused by patient 08/21/2015    . Noncompliance 03/21/2015  . Essential hypertension 03/21/2015  . Uncontrolled diabetes mellitus with complication, with long-term current use of insulin ([place]) 03/21/2015  . Lymphadenitis 03/21/2015  . SKIN LESION 10/19/2008  . SYSTOLIC MURMUR 84/69/6295  . GLAUCOMA 07/17/2006  . VISUAL DISTURBANCE NOS 07/17/2006  . REFLUX ESOPHAGITIS 07/17/2006    Past Medical History:  Diagnosis Date  . Anemia   . Arthritis    knees  . Bronchitis   . Diabetes mellitus without complication [facility])    age 23yo  . GERD (gastroesophageal reflux disease)   . Glaucoma    Dr. Einar Gip  . H/O mammogram 2005  . Headache   . Heart murmur   . Hyperlipidemia   . Hypertension   . Intermittent palpitations   . Legally blind   . Myopia   . Pneumonia   . Routine gynecological examination    last pap 2005  . Seasonal allergies   . Shortness of breath dyspnea   . Sinusitis   . Thyroid nodule     Family History  Problem Relation Age of Onset  . Diabetes Mother   . Hypertension Mother   . Stroke Mother   . Hypertension Father   . Chronic Renal Failure Father   . Hypertension Sister   . Hypertension Brother   . Stroke Maternal Grandmother   . Asthma Daughter   . Healthy Daughter   . Healthy Son   . Cancer Neg Hx   . Heart disease Neg Hx    Past Surgical History:  Procedure Laterality Date  . CYSTECTOMY     umbilicus  . INCISION AND DRAINAGE     abdominal superficial abscess  .  LAPAROSCOPIC VAGINAL HYSTERECTOMY WITH SALPINGECTOMY Bilateral 09/13/2015   Procedure: LAPAROSCOPIC ASSISTED VAGINAL HYSTERECTOMY WITH SALPINGECTOMY, McCalls Colpoplasty;  Surgeon: Thurnell Lose, MD;  Location: [facility];  Service: Gynecology;  Laterality: Bilateral;  . LASIK     x2   Social History   Social History Narrative   Married, has 3 children, not exercising, but walks at work.     Lives in a one story home.     On disability.  Education: some college.    Objective: Vital Signs: LMP 09/13/2015     Physical Exam   Musculoskeletal Exam: ***  CDAI Exam: CDAI Score: Not documented Patient Global Assessment: Not documented; Provider Global Assessment: Not documented Swollen: Not documented; Tender: Not documented Joint Exam   Not documented   There is currently no information documented on the homunculus. Go to the Rheumatology activity and complete the homunculus joint exam.  Investigation: Findings:  December 22, 2017 AVISE lupus index -1.8: ANA 1: 320 nucleolar, ENA negative, CB CAP negative anticardiolipin negative, beta-2 GP 1-, RF negative, anti-CCP negative, anti-carbamylated protein negative, antithyroglobulin negative, antithyroid peroxidase negative   Imaging: No results found.  Recent Labs: Lab Results  Component Value Date   WBC 5.6 08/20/2017   HGB 13.9 08/20/2017   PLT 401 (H) 08/20/2017   NA 141 10/24/2017   K 3.7 10/24/2017   CL 99 10/24/2017   CO2 26 10/24/2017   GLUCOSE 120 (H) 10/24/2017   BUN 18 10/24/2017   CREATININE 0.96 10/24/2017   BILITOT 0.6 10/24/2017   ALKPHOS 91 10/24/2017   AST 16 10/24/2017   ALT 23 10/24/2017   PROT 7.3 12/19/2017   ALBUMIN 4.7 10/24/2017   CALCIUM 10.3 (H) 10/24/2017   GFRAA 79 10/24/2017  SPEP normal, immunoglobulin IgA mildly elevated at 352, ESR 17  Speciality Comments: No specialty comments available.  Procedures:  No procedures performed Allergies: Amlodipine; Lisinopril; Metformin and related; and Tanzeum [albiglutide]   Assessment / Plan:     Visit Diagnoses: Positive ANA (antinuclear antibody) - ENA negative, CB CAP negative, anticardiolipin negative, beta-2 GP 1-, RF negative, anti-CCP negative  DDD (degenerative disc disease), lumbar  Chronic SI joint pain - X-rays unremarkable, CT is scan SI joint consistent with degenerative changes.  Primary osteoarthritis of both knees - Bilateral moderate with chondromalacia patella  Vitamin D deficiency  History of diabetes mellitus  History of  glaucoma  Dyslipidemia  Essential hypertension  History of hydronephrosis   Orders: No orders of the defined types were placed in this encounter.  No orders of the defined types were placed in this encounter.   Face-to-face time spent with patient was *** minutes. Greater than 50% of time was spent in counseling and coordination of care.  Follow-Up Instructions: No follow-ups on file.   Bo Merino, MD  Note - This record has been created using Editor, commissioning.  Chart creation errors have been sought, but may not always  have been located. Such creation errors do not reflect on  the standard of medical care.

## 2018-01-21 ENCOUNTER — Telehealth: Payer: Self-pay | Admitting: Family Medicine

## 2018-01-21 ENCOUNTER — Other Ambulatory Visit: Payer: Self-pay | Admitting: Medical

## 2018-01-21 LAB — HEMOGLOBIN A1C
Est. average glucose Bld gHb Est-mCnc: 203 mg/dL
Hgb A1c MFr Bld: 8.7 % — ABNORMAL HIGH (ref 4.8–5.6)

## 2018-01-21 LAB — COMPREHENSIVE METABOLIC PANEL
ALT: 17 IU/L (ref 0–32)
AST: 16 IU/L (ref 0–40)
Albumin/Globulin Ratio: 1.5 (ref 1.2–2.2)
Albumin: 4.3 g/dL (ref 3.5–5.5)
Alkaline Phosphatase: 75 IU/L (ref 39–117)
BUN/Creatinine Ratio: 15 (ref 9–23)
BUN: 12 mg/dL (ref 6–24)
Bilirubin Total: 0.7 mg/dL (ref 0.0–1.2)
CO2: 25 mmol/L (ref 20–29)
Calcium: 9.4 mg/dL (ref 8.7–10.2)
Chloride: 101 mmol/L (ref 96–106)
Creatinine, Ser: 0.8 mg/dL (ref 0.57–1.00)
GFR calc Af Amer: 97 mL/min/{1.73_m2} (ref >59)
GFR calc non Af Amer: 84 mL/min/{1.73_m2} (ref >59)
Globulin, Total: 2.8 g/dL (ref 1.5–4.5)
Glucose: 191 mg/dL — ABNORMAL HIGH (ref 65–99)
Potassium: 3.8 mmol/L (ref 3.5–5.2)
Sodium: 141 mmol/L (ref 134–144)
Total Protein: 7.1 g/dL (ref 6.0–8.5)

## 2018-01-21 MED ORDER — INSULIN PEN NEEDLE 32G X 6 MM MISC: 11 refills | Status: DC

## 2018-01-21 MED ORDER — INSULIN GLARGINE 100 UNIT/ML SOLOSTAR PEN: 50.0000 [IU] | PEN_INJECTOR | Freq: Every day | SUBCUTANEOUS | 2 refills | Status: DC

## 2018-01-21 MED ORDER — INSULIN ASPART 100 UNIT/ML FLEXPEN: 20.0000 [IU] | PEN_INJECTOR | Freq: Three times a day (TID) | SUBCUTANEOUS | 2 refills | Status: DC

## 2018-01-21 MED ORDER — ROSUVASTATIN CALCIUM 10 MG PO TABS: 10.0000 mg | ORAL_TABLET | Freq: Every day | ORAL | 1 refills | Status: DC

## 2018-01-21 NOTE — Telephone Encounter (Signed)
The statement in the after visit summary is a general statement.   There was no assumption that she wasn't eating healthy.   I certainly heard her talk about her diet and eating patterns yesterday.  I understand she had went to a cookout recently.  I also know her blood pressures are chronically elevated and not at goal, similar to her diabetes.   That is why I have her seeing the kidney specialist and soon to be seeing diabetes specialist to get a better handle on those issues.  She was NOT agreeable to any adjustments in her blood pressure regimen yesterday despite my recommendation.    Nevertheless, the after visit summary statement was a general recommendation, not an accusation or wrongful assumption.    Tina Horton, she has had a history of noncompliance, unwillingness to follow treatment and medication recommendations.  This isn't the first time she has had a rebuttal or problem with a comment or recommendations.   If she continues to have this sort of behavior or after the fact comment, I may dismiss her as I think she is potentially a liability to me and her self given her lack of compliance on things she should be doing, but yet she takes issue with something like this general statement in her after visit summary.

## 2018-01-21 NOTE — Telephone Encounter (Signed)
Pt called very upset that her after visit summary said eat a healthy diet and she wants that removed from her chart.  She states that is all she does normally.  She eats raw fruits and vegetables.  She is almost a vegan, eats cheese.  She has smoothies, does not use salt or anything white, no sugar.  She wants to make sure that you know that, she said she didn't think that you heard her tell you this info.  I reassured her that your end goal was to keep her healthy and that all providers are going to say eat healthy and exercise.  She stated that she is going through a lot emotionally and is in daily pain.  She stated she [place] just [place] and had some cake and pickles and did not use her insulin that day.     She said her son is a Engineer, agricultural and that he researches healthy foods for her.

## 2018-01-22 NOTE — Telephone Encounter (Signed)
So noted 

## 2018-01-23 ENCOUNTER — Telehealth: Payer: Self-pay | Admitting: Medical

## 2018-01-23 NOTE — Telephone Encounter (Signed)
error 

## 2018-02-02 ENCOUNTER — Ambulatory Visit (INDEPENDENT_AMBULATORY_CARE_PROVIDER_SITE_OTHER): Payer: BLUE CROSS/BLUE SHIELD | Admitting: Specialist

## 2018-02-03 ENCOUNTER — Ambulatory Visit: Payer: BLUE CROSS/BLUE SHIELD | Admitting: Rheumatology

## 2018-02-27 ENCOUNTER — Telehealth: Payer: Self-pay | Admitting: Medical

## 2018-02-27 NOTE — Telephone Encounter (Signed)
Received requested records from [facility]. Sending back for review.

## 2018-03-09 ENCOUNTER — Ambulatory Visit: Payer: BLUE CROSS/BLUE SHIELD | Admitting: Endocrinology

## 2018-03-13 DIAGNOSIS — E559 Vitamin D deficiency, unspecified: Secondary | ICD-10-CM | POA: Insufficient documentation

## 2018-03-13 DIAGNOSIS — M47818 Spondylosis without myelopathy or radiculopathy, sacral and sacrococcygeal region: Secondary | ICD-10-CM | POA: Insufficient documentation

## 2018-03-13 NOTE — Progress Notes (Signed)
Office Visit Note  Patient: Tina Horton             Date of Birth: 09/22/1965           MRN: [id number]             PCP: Carlena Hurl, PA-C Referring: Carlena Hurl, PA-C Visit Date: 03/19/2018 Occupation: @GUAROCC @  Subjective:  Pain in bilateral knee joints and lower back.   History of Present Illness: SHANE MELBY is a 52 y.o. female with history of positive ANA and osteoarthritis.  She states she continues to have discomfort in her lower back.  She has been using a back brace.  She also has knee joint discomfort.  She denies any joint swelling.  She was diagnosed with vitamin D deficiency and she just finished a course of vitamin D.  She has positive ANA.  She denies any history of oral ulcers, nasal ulcers, malar rash or raynaud's phenominon.  Activities of Daily Living:  Patient reports morning stiffness for 0 minutes.   Patient Reports nocturnal pain.  Difficulty dressing/grooming: Denies Difficulty climbing stairs: Reports Difficulty getting out of chair: Reports Difficulty using hands for taps, buttons, cutlery, and/or writing: Reports  Review of Systems  Constitutional: Negative for fatigue.  HENT: Negative for mouth sores, trouble swallowing, trouble swallowing and mouth dryness.   Eyes: Positive for dryness. Negative for pain, redness and itching.  Respiratory: Negative for shortness of breath, wheezing and difficulty breathing.   Cardiovascular: Negative for chest pain, palpitations and swelling in legs/feet.  Gastrointestinal: Negative for abdominal pain, constipation, diarrhea, nausea and vomiting.  Endocrine: Negative for increased urination.  Genitourinary: Negative for painful urination, nocturia and pelvic pain.  Musculoskeletal: Positive for arthralgias and joint pain. Negative for joint swelling and morning stiffness.  Skin: Negative for rash and hair loss.  Allergic/Immunologic: Negative for susceptible to infections.  Neurological: Negative  for dizziness, light-headedness, headaches, memory loss and weakness.  Hematological: Negative for bruising/bleeding tendency.  Psychiatric/Behavioral: Negative for confusion. The patient is not nervous/anxious.     PMFS History:  Patient Active Problem List   Diagnosis Date Noted  . 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 Vitamin D deficiency 03/13/2018  . Chronic kidney disease 01/20/2018  . Primary osteoarthritis of both knees 01/20/2018  . DDD (degenerative disc disease), lumbar 01/20/2018  . Localized swelling, mass or lump of neck 10/30/2017  . Microalbuminuria 08/26/2017  . Mass of neck 08/26/2017  . Screening for cancer 08/20/2017  . Left leg pain 08/20/2017  . Respiratory tract infection 08/20/2017  . Nausea 08/20/2017  . Renal cyst 01/15/2016  . Hydronephrosis 01/15/2016  . Spinal stenosis 01/15/2016  . Anemia, iron deficiency 12/04/2015  . History of burning pain in leg 12/04/2015  . S/P laparoscopic assisted vaginal hysterectomy (LAVH) 09/13/2015  . Encounter for health maintenance examination in adult 08/21/2015  . Hyperlipidemia 08/21/2015  . Glaucoma 08/21/2015  . Heart murmur 08/21/2015  . Thyroid nodule 08/21/2015  . Vaccine refused by patient 08/21/2015  . Noncompliance 03/21/2015  . Essential hypertension 03/21/2015  . Uncontrolled diabetes mellitus with complication, with long-term current use of insulin ([place]) 03/21/2015  . Lymphadenitis 03/21/2015  . SKIN LESION 10/19/2008  . SYSTOLIC MURMUR 28/41/3244  . GLAUCOMA 07/17/2006  . VISUAL DISTURBANCE NOS 07/17/2006  . REFLUX ESOPHAGITIS 07/17/2006    Past Medical History:  Diagnosis Date  . Anemia   . Arthritis    knees  . Bronchitis   . Diabetes mellitus without complication ([place])  age 32yo  . GERD (gastroesophageal reflux disease)   . Glaucoma    Dr. Einar Gip  . H/O mammogram 2005  . Headache   . Heart murmur   . Hyperlipidemia   . Hypertension   . Intermittent palpitations   . Legally blind     . Myopia   . Pneumonia   . Routine gynecological examination    last pap 2005  . Seasonal allergies   . Shortness of breath dyspnea   . Sinusitis   . Thyroid nodule     Family History  Problem Relation Age of Onset  . Diabetes Mother   . Hypertension Mother   . Stroke Mother   . Hypertension Father   . Chronic Renal Failure Father   . Hypertension Sister   . Hypertension Brother   . Stroke Maternal Grandmother   . Asthma Daughter   . Healthy Daughter   . Healthy Son   . Cancer Neg Hx   . Heart disease Neg Hx    Past Surgical History:  Procedure Laterality Date  . CYSTECTOMY     umbilicus  . INCISION AND DRAINAGE     abdominal superficial abscess  . LAPAROSCOPIC VAGINAL HYSTERECTOMY WITH SALPINGECTOMY Bilateral 09/13/2015   Procedure: LAPAROSCOPIC ASSISTED VAGINAL HYSTERECTOMY WITH SALPINGECTOMY, [facility] Colpoplasty;  Surgeon: Thurnell Lose, MD;  Location: [facility];  Service: Gynecology;  Laterality: Bilateral;  . LASIK     x2   Social History   Social History Narrative   Married, has 3 children, not exercising, but walks at work.     Lives in a one story home.     On disability.  Education: some college.    Objective: Vital Signs: BP (!) 190/95 (BP Location: Left Arm, Patient Position: Sitting, Cuff Size: Normal)   Pulse (!) 106   Resp 13   Ht 5\' 1"  (1.549 m)   Wt 150 lb 9.6 oz (68.3 kg)   LMP 09/13/2015   BMI 28.46 kg/m    Physical Exam  Constitutional: She is oriented to person, place, and time. She appears well-developed and well-nourished.  HENT:  Head: Normocephalic and atraumatic.  Eyes: Conjunctivae and EOM are normal.  Neck: Normal range of motion.  Cardiovascular: Normal rate, regular rhythm, normal heart sounds and intact distal pulses.  Pulmonary/Chest: Effort normal and breath sounds normal.  Abdominal: Soft. Bowel sounds are normal.  Lymphadenopathy:    She has no cervical adenopathy.  Neurological: She is alert and oriented to person,  place, and time.  Skin: Skin is warm and dry. Capillary refill takes less than 2 seconds.  Psychiatric: She has a normal mood and affect. Her behavior is normal.  Nursing note and vitals reviewed.    Musculoskeletal Exam: C-spine thoracic spine good range of motion.  She has limited painful range of motion of the lumbar spine.  Shoulder joints elbow joints wrist joint MCPs PIPs DIPs been good range of motion with no synovitis.  Hip joints knee joints ankles MTPs PIPs were in good range of motion with no synovitis.  CDAI Exam: CDAI Score: Not documented Patient Global Assessment: Not documented; Provider Global Assessment: Not documented Swollen: Not documented; Tender: Not documented Joint Exam   Not documented   There is currently no information documented on the homunculus. Go to the Rheumatology activity and complete the homunculus joint exam.  Investigation: Findings:  December 22, 2017 ANA 1: 320 nucleolar pattern, ENA negative, CB CAP negative, Jo 1-, RF negative, anti-CCP negative, anticardiolipin negative, beta-2 GP  1-, antithyroglobulin negative, antithyroid peroxidase negative, lupus index -1.8   Imaging: No results found.  Recent Labs: Lab Results  Component Value Date   WBC 5.6 08/20/2017   HGB 13.9 08/20/2017   PLT 401 (H) 08/20/2017   NA 141 01/20/2018   K 3.8 01/20/2018   CL 101 01/20/2018   CO2 25 01/20/2018   GLUCOSE 191 (H) 01/20/2018   BUN 12 01/20/2018   CREATININE 0.80 01/20/2018   BILITOT 0.7 01/20/2018   ALKPHOS 75 01/20/2018   AST 16 01/20/2018   ALT 17 01/20/2018   PROT 7.1 01/20/2018   ALBUMIN 4.3 01/20/2018   CALCIUM 9.4 01/20/2018   GFRAA 97 01/20/2018   December 19, 2017 SPEP normal, immunoglobulins normal, sed rate 17 Speciality Comments: No specialty comments available.  Procedures:  No procedures performed Allergies: Amlodipine; Lisinopril; Metformin and related; and Tanzeum [albiglutide]   Assessment / Plan:     Visit Diagnoses: DDD  (degenerative disc disease), lumbar - With spinal stenosis.  She continues to have lower back pain.  Wearing a back brace currently.  A handout on back exercises was given.  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  Knee joint exercises were discussed and a handout was given.  Positive ANA (antinuclear antibody) - AVISE lupus index -1.3, ANA 1: 320 nucleolar, ENA negative, CB CAP negative.  No clinical features of autoimmune disease.  Lab results were discussed at length.  Vitamin D deficiency-patient finished her vitamin D course.  We will check a vitamin D level.  She will need maintenance vitamin D after that.  Essential hypertension -her blood pressure is elevated.  Have advised her to monitor blood pressure closely and follow-up with the PCP.  Uncontrolled diabetes mellitus with complication, with long-term current use of insulin ([facility])-her blood sugar has been elevated as well.  Hydronephrosis, unspecified hydronephrosis type  History of hyperlipidemia  History of glaucoma Orders: Orders Placed This Encounter  Procedures  . VITAMIN D 25 Hydroxy (Vit-D Deficiency, Fractures)   No orders of the defined types were placed in this encounter.   Face-to-face time spent with patient was 30 minutes. Greater than 50% of time was spent in counseling and coordination of care.  Follow-Up Instructions: Return if symptoms worsen or fail to improve, for Osteoarthritis.   Bo Merino, MD  Note - This record has been created using Editor, commissioning.  Chart creation errors have been sought, but may not always  have been located. Such creation errors do not reflect on  the standard of medical care.

## 2018-03-16 ENCOUNTER — Other Ambulatory Visit: Payer: Self-pay | Admitting: Rheumatology

## 2018-03-16 NOTE — Telephone Encounter (Signed)
Patient advised she will need labs prior to refill. Patient has an appointment on 03/19/18 and will have Vitamin D drawn then.

## 2018-03-19 ENCOUNTER — Encounter: Payer: Self-pay | Admitting: Rheumatology

## 2018-03-19 ENCOUNTER — Ambulatory Visit: Payer: BLUE CROSS/BLUE SHIELD | Admitting: Rheumatology

## 2018-03-19 VITALS — BP 190/95 | HR 106 | Resp 13 | Ht 61.0 in | Wt 150.6 lb

## 2018-03-19 DIAGNOSIS — M170 Bilateral primary osteoarthritis of knee: Secondary | ICD-10-CM

## 2018-03-19 DIAGNOSIS — R768 Other specified abnormal immunological findings in serum: Secondary | ICD-10-CM

## 2018-03-19 DIAGNOSIS — M5136 Other intervertebral disc degeneration, lumbar region: Secondary | ICD-10-CM

## 2018-03-19 DIAGNOSIS — E559 Vitamin D deficiency, unspecified: Secondary | ICD-10-CM

## 2018-03-19 DIAGNOSIS — M47818 Spondylosis without myelopathy or radiculopathy, sacral and sacrococcygeal region: Secondary | ICD-10-CM | POA: Diagnosis not present

## 2018-03-19 DIAGNOSIS — I10 Essential (primary) hypertension: Secondary | ICD-10-CM

## 2018-03-19 NOTE — Patient Instructions (Signed)
Back Exercises The following exercises strengthen the muscles that help to support the back. They also help to keep the lower back flexible. Doing these exercises can help to prevent back pain or lessen existing pain. If you have back pain or discomfort, try doing these exercises 2-3 times each day or as told by your health care provider. When the pain goes away, do them once each day, but increase the number of times that you repeat the steps for each exercise (do more repetitions). If you do not have back pain or discomfort, do these exercises once each day or as told by your health care provider. Exercises Single Knee to Chest  Repeat these steps 3-5 times for each leg: 1. Lie on your back on a firm bed or the floor with your legs extended. 2. Bring one knee to your chest. Your other leg should stay extended and in contact with the floor. 3. Hold your knee in place by grabbing your knee or thigh. 4. Pull on your knee until you feel a gentle stretch in your lower back. 5. Hold the stretch for 10-30 seconds. 6. Slowly release and straighten your leg.  Pelvic Tilt  Repeat these steps 5-10 times: 1. Lie on your back on a firm bed or the floor with your legs extended. 2. Bend your knees so they are pointing toward the ceiling and your feet are flat on the floor. 3. Tighten your lower abdominal muscles to press your lower back against the floor. This motion will tilt your pelvis so your tailbone points up toward the ceiling instead of pointing to your feet or the floor. 4. With gentle tension and even breathing, hold this position for 5-10 seconds.  Cat-Cow  Repeat these steps until your lower back becomes more flexible: 1. Get into a hands-and-knees position on a firm surface. Keep your hands under your shoulders, and keep your knees under your hips. You may place padding under your knees for comfort. 2. Let your head hang down, and point your tailbone toward the floor so your lower back  becomes rounded like the back of a cat. 3. Hold this position for 5 seconds. 4. Slowly lift your head and point your tailbone up toward the ceiling so your back forms a sagging arch like the back of a cow. 5. Hold this position for 5 seconds.  Press-Ups  Repeat these steps 5-10 times: 1. Lie on your abdomen (face-down) on the floor. 2. Place your palms near your head, about shoulder-width apart. 3. While you keep your back as relaxed as possible and keep your hips on the floor, slowly straighten your arms to raise the top half of your body and lift your shoulders. Do not use your back muscles to raise your upper torso. You may adjust the placement of your hands to make yourself more comfortable. 4. Hold this position for 5 seconds while you keep your back relaxed. 5. Slowly return to lying flat on the floor.  Bridges  Repeat these steps 10 times: 1. Lie on your back on a firm surface. 2. Bend your knees so they are pointing toward the ceiling and your feet are flat on the floor. 3. Tighten your buttocks muscles and lift your buttocks off of the floor until your waist is at almost the same height as your knees. You should feel the muscles working in your buttocks and the back of your thighs. If you do not feel these muscles, slide your feet 1-2 inches farther away   from your buttocks. 4. Hold this position for 3-5 seconds. 5. Slowly lower your hips to the starting position, and allow your buttocks muscles to relax completely.  If this exercise is too easy, try doing it with your arms crossed over your chest. Abdominal Crunches  Repeat these steps 5-10 times: 1. Lie on your back on a firm bed or the floor with your legs extended. 2. Bend your knees so they are pointing toward the ceiling and your feet are flat on the floor. 3. Cross your arms over your chest. 4. Tip your chin slightly toward your chest without bending your neck. 5. Tighten your abdominal muscles and slowly raise your  trunk (torso) high enough to lift your shoulder blades a tiny bit off of the floor. Avoid raising your torso higher than that, because it can put too much stress on your low back and it does not help to strengthen your abdominal muscles. 6. Slowly return to your starting position.  Back Lifts Repeat these steps 5-10 times: 1. Lie on your abdomen (face-down) with your arms at your sides, and rest your forehead on the floor. 2. Tighten the muscles in your legs and your buttocks. 3. Slowly lift your chest off of the floor while you keep your hips pressed to the floor. Keep the back of your head in line with the curve in your back. Your eyes should be looking at the floor. 4. Hold this position for 3-5 seconds. 5. Slowly return to your starting position.  Contact a health care provider if:  Your back pain or discomfort gets much worse when you do an exercise.  Your back pain or discomfort does not lessen within 2 hours after you exercise. If you have any of these problems, stop doing these exercises right away. Do not do them again unless your health care provider says that you can. Get help right away if:  You develop sudden, severe back pain. If this happens, stop doing the exercises right away. Do not do them again unless your health care provider says that you can. This information is not intended to replace advice given to you by your health care provider. Make sure you discuss any questions you have with your health care provider. Document Released: 06/13/2004 Document Revised: 09/13/2015 Document Reviewed: 06/30/2014 Elsevier Interactive Patient Education  2017 [employer]. Knee Exercises Ask your health care provider which exercises are safe for you. Do exercises exactly as told by your health care provider and adjust them as directed. It is normal to feel mild stretching, pulling, tightness, or discomfort as you do these exercises, but you should stop right away if you feel sudden pain  or your pain gets worse.Do not begin these exercises until told by your health care provider. STRETCHING AND RANGE OF MOTION EXERCISES These exercises warm up your muscles and joints and improve the movement and flexibility of your knee. These exercises also help to relieve pain, numbness, and tingling. Exercise A: Knee Extension, Prone 1. Lie on your abdomen on a bed. 2. Place your left / right knee just beyond the edge of the surface so your knee is not on the bed. You can put a towel under your left / right thigh just above your knee for comfort. 3. Relax your leg muscles and allow gravity to straighten your knee. You should feel a stretch behind your left / right knee. 4. Hold this position for __________ seconds. 5. Scoot up so your knee is supported between repetitions. Repeat  __________ times. Complete this stretch __________ times a day. Exercise B: Knee Flexion, Active  1. Lie on your back with both knees straight. If this causes back discomfort, bend your left / right knee so your foot is flat on the floor. 2. Slowly slide your left / right heel back toward your buttocks until you feel a gentle stretch in the front of your knee or thigh. 3. Hold this position for __________ seconds. 4. Slowly slide your left / right heel back to the starting position. Repeat __________ times. Complete this exercise __________ times a day. Exercise C: Quadriceps, Prone  1. Lie on your abdomen on a firm surface, such as a bed or padded floor. 2. Bend your left / right knee and hold your ankle. If you cannot reach your ankle or pant leg, loop a belt around your foot and grab the belt instead. 3. Gently pull your heel toward your buttocks. Your knee should not slide out to the side. You should feel a stretch in the front of your thigh and knee. 4. Hold this position for __________ seconds. Repeat __________ times. Complete this stretch __________ times a day. Exercise D: Hamstring, Supine 1. Lie on  your back. 2. Loop a belt or towel over the ball of your left / right foot. The ball of your foot is on the walking surface, right under your toes. 3. Straighten your left / right knee and slowly pull on the belt to raise your leg until you feel a gentle stretch behind your knee. ? Do not let your left / right knee bend while you do this. ? Keep your other leg flat on the floor. 4. Hold this position for __________ seconds. Repeat __________ times. Complete this stretch __________ times a day. STRENGTHENING EXERCISES These exercises build strength and endurance in your knee. Endurance is the ability to use your muscles for a long time, even after they get tired. Exercise E: Quadriceps, Isometric  1. Lie on your back with your left / right leg extended and your other knee bent. Put a rolled towel or small pillow under your knee if told by your health care provider. 2. Slowly tense the muscles in the front of your left / right thigh. You should see your kneecap slide up toward your hip or see increased dimpling just above the knee. This motion will push the back of the knee toward the floor. 3. For __________ seconds, keep the muscle as tight as you can without increasing your pain. 4. Relax the muscles slowly and completely. Repeat __________ times. Complete this exercise __________ times a day. Exercise F: Straight Leg Raises - Quadriceps 1. Lie on your back with your left / right leg extended and your other knee bent. 2. Tense the muscles in the front of your left / right thigh. You should see your kneecap slide up or see increased dimpling just above the knee. Your thigh may even shake a bit. 3. Keep these muscles tight as you raise your leg 4-6 inches (10-15 cm) off the floor. Do not let your knee bend. 4. Hold this position for __________ seconds. 5. Keep these muscles tense as you lower your leg. 6. Relax your muscles slowly and completely after each repetition. Repeat __________ times.  Complete this exercise __________ times a day. Exercise G: Hamstring, Isometric 1. Lie on your back on a firm surface. 2. Bend your left / right knee approximately __________ degrees. 3. Dig your left / right heel into the surface  as if you are trying to pull it toward your buttocks. Tighten the muscles in the back of your thighs to dig as hard as you can without increasing any pain. 4. Hold this position for __________ seconds. 5. Release the tension gradually and allow your muscles to relax completely for __________ seconds after each repetition. Repeat __________ times. Complete this exercise __________ times a day. Exercise H: Hamstring Curls  If told by your health care provider, do this exercise while wearing ankle weights. Begin with __________ weights. Then increase the weight by 1 lb (0.5 kg) increments. Do not wear ankle weights that are more than __________. 1. Lie on your abdomen with your legs straight. 2. Bend your left / right knee as far as you can without feeling pain. Keep your hips flat against the floor. 3. Hold this position for __________ seconds. 4. Slowly lower your leg to the starting position.  Repeat __________ times. Complete this exercise __________ times a day. Exercise I: Squats (Quadriceps) 1. Stand in front of a table, with your feet and knees pointing straight ahead. You may rest your hands on the table for balance but not for support. 2. Slowly bend your knees and lower your hips like you are going to sit in a chair. ? Keep your weight over your heels, not over your toes. ? Keep your lower legs upright so they are parallel with the table legs. ? Do not let your hips go lower than your knees. ? Do not bend lower than told by your health care provider. ? If your knee pain increases, do not bend as low. 3. Hold the squat position for __________ seconds. 4. Slowly push with your legs to return to standing. Do not use your hands to pull yourself to  standing. Repeat __________ times. Complete this exercise __________ times a day. Exercise J: Wall Slides (Quadriceps)  1. Lean your back against a smooth wall or door while you walk your feet out 18-24 inches (46-61 cm) from it. 2. Place your feet hip-width apart. 3. Slowly slide down the wall or door until your knees bend __________ degrees. Keep your knees over your heels, not over your toes. Keep your knees in line with your hips. 4. Hold for __________ seconds. Repeat __________ times. Complete this exercise __________ times a day. Exercise K: Straight Leg Raises - Hip Abductors 1. Lie on your side with your left / right leg in the top position. Lie so your head, shoulder, knee, and hip line up. You may bend your bottom knee to help you keep your balance. 2. Roll your hips slightly forward so your hips are stacked directly over each other and your left / right knee is facing forward. 3. Leading with your heel, lift your top leg 4-6 inches (10-15 cm). You should feel the muscles in your outer hip lifting. ? Do not let your foot drift forward. ? Do not let your knee roll toward the ceiling. 4. Hold this position for __________ seconds. 5. Slowly return your leg to the starting position. 6. Let your muscles relax completely after each repetition. Repeat __________ times. Complete this exercise __________ times a day. Exercise L: Straight Leg Raises - Hip Extensors 1. Lie on your abdomen on a firm surface. You can put a pillow under your hips if that is more comfortable. 2. Tense the muscles in your buttocks and lift your left / right leg about 4-6 inches (10-15 cm). Keep your knee straight as you lift your leg.  3. Hold this position for __________ seconds. 4. Slowly lower your leg to the starting position. 5. Let your leg relax completely after each repetition. Repeat __________ times. Complete this exercise __________ times a day. This information is not intended to replace advice given  to you by your health care provider. Make sure you discuss any questions you have with your health care provider. Document Released: 03/20/2005 Document Revised: 01/29/2016 Document Reviewed: 03/12/2015 Elsevier Interactive Patient Education  2018 [employer].

## 2018-03-20 ENCOUNTER — Other Ambulatory Visit: Payer: Self-pay | Admitting: Nephrology

## 2018-03-20 DIAGNOSIS — I129 Hypertensive chronic kidney disease with stage 1 through stage 4 chronic kidney disease, or unspecified chronic kidney disease: Secondary | ICD-10-CM

## 2018-03-20 DIAGNOSIS — N182 Chronic kidney disease, stage 2 (mild): Secondary | ICD-10-CM

## 2018-03-20 DIAGNOSIS — R809 Proteinuria, unspecified: Secondary | ICD-10-CM

## 2018-03-20 DIAGNOSIS — N1330 Unspecified hydronephrosis: Secondary | ICD-10-CM

## 2018-03-20 LAB — VITAMIN D 25 HYDROXY (VIT D DEFICIENCY, FRACTURES): Vit D, 25-Hydroxy: 81 ng/mL (ref 30–100)

## 2018-03-20 NOTE — Progress Notes (Signed)
Vit D is normal , on the higher side. She may take Vit D 2 mg po qd.

## 2018-03-25 ENCOUNTER — Ambulatory Visit: Payer: BLUE CROSS/BLUE SHIELD | Admitting: Endocrinology

## 2018-03-26 ENCOUNTER — Encounter (INDEPENDENT_AMBULATORY_CARE_PROVIDER_SITE_OTHER): Payer: Self-pay | Admitting: Specialist

## 2018-03-26 ENCOUNTER — Ambulatory Visit
Admission: RE | Admit: 2018-03-26 | Discharge: 2018-03-26 | Disposition: A | Discharge status: Home / Self Care | Payer: BLUE CROSS/BLUE SHIELD | Source: Ambulatory Visit | Attending: Nephrology | Admitting: Nephrology

## 2018-03-26 ENCOUNTER — Ambulatory Visit (INDEPENDENT_AMBULATORY_CARE_PROVIDER_SITE_OTHER): Payer: BLUE CROSS/BLUE SHIELD | Admitting: Specialist

## 2018-03-26 VITALS — BP 174/94 | HR 94 | Ht 61.0 in | Wt 150.0 lb

## 2018-03-26 DIAGNOSIS — M5136 Other intervertebral disc degeneration, lumbar region: Secondary | ICD-10-CM | POA: Diagnosis not present

## 2018-03-26 DIAGNOSIS — M4726 Other spondylosis with radiculopathy, lumbar region: Secondary | ICD-10-CM | POA: Diagnosis not present

## 2018-03-26 DIAGNOSIS — M533 Sacrococcygeal disorders, not elsewhere classified: Secondary | ICD-10-CM

## 2018-03-26 DIAGNOSIS — N1330 Unspecified hydronephrosis: Secondary | ICD-10-CM

## 2018-03-26 DIAGNOSIS — I129 Hypertensive chronic kidney disease with stage 1 through stage 4 chronic kidney disease, or unspecified chronic kidney disease: Secondary | ICD-10-CM

## 2018-03-26 DIAGNOSIS — R809 Proteinuria, unspecified: Secondary | ICD-10-CM

## 2018-03-26 DIAGNOSIS — M51369 Other intervertebral disc degeneration, lumbar region without mention of lumbar back pain or lower extremity pain: Secondary | ICD-10-CM

## 2018-03-26 DIAGNOSIS — N182 Chronic kidney disease, stage 2 (mild): Secondary | ICD-10-CM

## 2018-03-26 NOTE — Progress Notes (Signed)
Office Visit Note   Patient: Tina Horton           Date of Birth: 1965-12-27           MRN: [id number] Visit Date: 03/26/2018              Requested by: Carlena Hurl, PA-C [place], [place] [address] PCP: Carlena Hurl, PA-C   Assessment & Plan: Visit Diagnoses: No diagnosis found.  Plan: Avoid bending, stooping and avoid lifting weights greater than 10 lbs. Avoid prolong standing and walking. Avoid frequent bending and stooping  No lifting greater than 10 lbs. May use ice or moist heat for pain. Weight loss is of benefit. Handicap license is approved.  Follow-Up Instructions: No follow-ups on file.   Orders:  No orders of the defined types were placed in this encounter.  No orders of the defined types were placed in this encounter.     Procedures: No procedures performed   Clinical Data: No additional findings.   Subjective: Chief Complaint  Patient presents with  . Lower Back - Follow-up    52 year old female with history of lumbar degenerative disc disease and spondylosis. She has SI joint degeneration and  Has right leg pain with prolong standing and walking and bicycling. She is going to use the bicycle she notices the pain into the right lateral thigh and buttock.    Review of Systems  Constitutional: Negative.   HENT: Negative.   Eyes: Negative.   Respiratory: Negative.   Cardiovascular: Negative.   Gastrointestinal: Negative.   Endocrine: Negative.   Genitourinary: Negative.   Musculoskeletal: Negative.   Skin: Negative.   Allergic/Immunologic: Negative.   Neurological: Negative.   Hematological: Negative.   Psychiatric/Behavioral: Negative.      Objective: Vital Signs: BP (!) 174/94 (BP Location: Left Arm, Patient Position: Sitting)   Pulse 94   Ht 5\' 1"  (1.549 m)   Wt 150 lb (68 kg)   LMP 09/13/2015   BMI 28.34 kg/m   Physical Exam  Constitutional: She is oriented to person, place, and time. She  appears well-developed and well-nourished.  HENT:  Head: Normocephalic and atraumatic.  Eyes: Pupils are equal, round, and reactive to light. EOM are normal.  Neck: Normal range of motion. Neck supple.  Pulmonary/Chest: Effort normal and breath sounds normal.  Abdominal: Soft. Bowel sounds are normal.  Musculoskeletal: Normal range of motion.  Neurological: She is alert and oriented to person, place, and time.  Skin: Skin is warm and dry.  Psychiatric: She has a normal mood and affect. Her behavior is normal. Judgment and thought content normal.    Ortho Exam  Specialty Comments:  No specialty comments available.  Imaging: No results found.   PMFS History: Patient Active Problem List   Diagnosis Date Noted  . 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 Vitamin D deficiency 03/13/2018  . Chronic kidney disease 01/20/2018  . Primary osteoarthritis of both knees 01/20/2018  . DDD (degenerative disc disease), lumbar 01/20/2018  . Localized swelling, mass or lump of neck 10/30/2017  . Microalbuminuria 08/26/2017  . Mass of neck 08/26/2017  . Screening for cancer 08/20/2017  . Left leg pain 08/20/2017  . Respiratory tract infection 08/20/2017  . Nausea 08/20/2017  . Renal cyst 01/15/2016  . Hydronephrosis 01/15/2016  . Spinal stenosis 01/15/2016  . Anemia, iron deficiency 12/04/2015  . History of burning pain in leg 12/04/2015  . S/P laparoscopic assisted vaginal hysterectomy (LAVH) 09/13/2015  .  Encounter for health maintenance examination in adult 08/21/2015  . Hyperlipidemia 08/21/2015  . Glaucoma 08/21/2015  . Heart murmur 08/21/2015  . Thyroid nodule 08/21/2015  . Vaccine refused by patient 08/21/2015  . Noncompliance 03/21/2015  . Essential hypertension 03/21/2015  . Uncontrolled diabetes mellitus with complication, with long-term current use of insulin ([place]) 03/21/2015  . Lymphadenitis 03/21/2015  . SKIN LESION 10/19/2008  . SYSTOLIC MURMUR 40/34/7425  . GLAUCOMA  07/17/2006  . VISUAL DISTURBANCE NOS 07/17/2006  . REFLUX ESOPHAGITIS 07/17/2006   Past Medical History:  Diagnosis Date  . Anemia   . Arthritis    knees  . Bronchitis   . Diabetes mellitus without complication [facility])    age 52yo  . GERD (gastroesophageal reflux disease)   . Glaucoma    Dr. Einar Gip  . H/O mammogram 2005  . Headache   . Heart murmur   . Hyperlipidemia   . Hypertension   . Intermittent palpitations   . Legally blind   . Myopia   . Pneumonia   . Routine gynecological examination    last pap 2005  . Seasonal allergies   . Shortness of breath dyspnea   . Sinusitis   . Thyroid nodule     Family History  Problem Relation Age of Onset  . Diabetes Mother   . Hypertension Mother   . Stroke Mother   . Hypertension Father   . Chronic Renal Failure Father   . Hypertension Sister   . Hypertension Brother   . Stroke Maternal Grandmother   . Asthma Daughter   . Healthy Daughter   . Healthy Son   . Cancer Neg Hx   . Heart disease Neg Hx     Past Surgical History:  Procedure Laterality Date  . CYSTECTOMY     umbilicus  . INCISION AND DRAINAGE     abdominal superficial abscess  . LAPAROSCOPIC VAGINAL HYSTERECTOMY WITH SALPINGECTOMY Bilateral 09/13/2015   Procedure: LAPAROSCOPIC ASSISTED VAGINAL HYSTERECTOMY WITH SALPINGECTOMY, [facility] Colpoplasty;  Surgeon: Thurnell Lose, MD;  Location: [facility];  Service: Gynecology;  Laterality: Bilateral;  . LASIK     x2   Social History   Occupational History  . Not on file  Tobacco Use  . Smoking status: Never Smoker  . Smokeless tobacco: Never Used  Substance and Sexual Activity  . Alcohol use: No    Alcohol/week: 0.0 standard drinks  . Drug use: No  . Sexual activity: Not on file

## 2018-03-26 NOTE — Patient Instructions (Signed)
Avoid bending, stooping and avoid lifting weights greater than 10 lbs. Avoid prolong standing and walking. Avoid frequent bending and stooping  No lifting greater than 10 lbs. May use ice or moist heat for pain. Weight loss is of benefit. Handicap license is approved.  

## 2018-04-25 ENCOUNTER — Emergency Department (HOSPITAL_COMMUNITY): Payer: BLUE CROSS/BLUE SHIELD

## 2018-04-25 ENCOUNTER — Emergency Department (HOSPITAL_COMMUNITY)
Admission: EM | Admit: 2018-04-25 | Discharge: 2018-04-25 | Disposition: A | Discharge status: Home / Self Care | Payer: BLUE CROSS/BLUE SHIELD | Attending: Emergency Medicine | Admitting: Emergency Medicine

## 2018-04-25 ENCOUNTER — Ambulatory Visit (HOSPITAL_COMMUNITY)
Admission: EM | Admit: 2018-04-25 | Discharge: 2018-04-25 | Disposition: A | Discharge status: Home / Self Care | Payer: BLUE CROSS/BLUE SHIELD | Source: Home / Self Care | Attending: Internal Medicine | Admitting: Internal Medicine

## 2018-04-25 ENCOUNTER — Encounter (HOSPITAL_COMMUNITY): Payer: Self-pay | Admitting: Emergency Medicine

## 2018-04-25 ENCOUNTER — Other Ambulatory Visit: Payer: Self-pay

## 2018-04-25 ENCOUNTER — Encounter (HOSPITAL_COMMUNITY): Payer: Self-pay

## 2018-04-25 DIAGNOSIS — Z794 Long term (current) use of insulin: Secondary | ICD-10-CM | POA: Insufficient documentation

## 2018-04-25 DIAGNOSIS — F419 Anxiety disorder, unspecified: Secondary | ICD-10-CM | POA: Insufficient documentation

## 2018-04-25 DIAGNOSIS — Z79899 Other long term (current) drug therapy: Secondary | ICD-10-CM | POA: Insufficient documentation

## 2018-04-25 DIAGNOSIS — E785 Hyperlipidemia, unspecified: Secondary | ICD-10-CM | POA: Diagnosis not present

## 2018-04-25 DIAGNOSIS — Z7982 Long term (current) use of aspirin: Secondary | ICD-10-CM | POA: Diagnosis not present

## 2018-04-25 DIAGNOSIS — R002 Palpitations: Secondary | ICD-10-CM | POA: Insufficient documentation

## 2018-04-25 DIAGNOSIS — E876 Hypokalemia: Secondary | ICD-10-CM

## 2018-04-25 DIAGNOSIS — I10 Essential (primary) hypertension: Secondary | ICD-10-CM

## 2018-04-25 DIAGNOSIS — J069 Acute upper respiratory infection, unspecified: Secondary | ICD-10-CM

## 2018-04-25 DIAGNOSIS — E119 Type 2 diabetes mellitus without complications: Secondary | ICD-10-CM | POA: Diagnosis not present

## 2018-04-25 DIAGNOSIS — R072 Precordial pain: Secondary | ICD-10-CM | POA: Diagnosis not present

## 2018-04-25 LAB — POCT I-STAT, CHEM 8
BUN: 15 mg/dL (ref 6–20)
Calcium, Ion: 1.19 mmol/L (ref 1.15–1.40)
Chloride: 101 mmol/L (ref 98–111)
Creatinine, Ser: 0.7 mg/dL (ref 0.44–1.00)
Glucose, Bld: 135 mg/dL — ABNORMAL HIGH (ref 70–99)
HCT: 40 % (ref 36.0–46.0)
Hemoglobin: 13.6 g/dL (ref 12.0–15.0)
Potassium: 3.2 mmol/L — ABNORMAL LOW (ref 3.5–5.1)
Sodium: 139 mmol/L (ref 135–145)
TCO2: 27 mmol/L (ref 22–32)

## 2018-04-25 LAB — CBC
HCT: 42.9 % (ref 36.0–46.0)
Hemoglobin: 13.3 g/dL (ref 12.0–15.0)
MCH: 27.6 pg (ref 26.0–34.0)
MCHC: 31 g/dL (ref 30.0–36.0)
MCV: 89 fL (ref 80.0–100.0)
Platelets: 309 10*3/uL (ref 150–400)
RBC: 4.82 MIL/uL (ref 3.87–5.11)
RDW: 12.3 % (ref 11.5–15.5)
WBC: 6.5 10*3/uL (ref 4.0–10.5)
nRBC: 0 % (ref 0.0–0.2)

## 2018-04-25 LAB — BASIC METABOLIC PANEL
Anion gap: 17 — ABNORMAL HIGH (ref 5–15)
BUN: 12 mg/dL (ref 6–20)
CO2: 25 mmol/L (ref 22–32)
Calcium: 9.6 mg/dL (ref 8.9–10.3)
Chloride: 95 mmol/L — ABNORMAL LOW (ref 98–111)
Creatinine, Ser: 0.85 mg/dL (ref 0.44–1.00)
GFR calc Af Amer: >60 mL/min (ref >60)
GFR calc non Af Amer: >60 mL/min (ref >60)
Glucose, Bld: 133 mg/dL — ABNORMAL HIGH (ref 70–99)
Potassium: 3 mmol/L — ABNORMAL LOW (ref 3.5–5.1)
Sodium: 137 mmol/L (ref 135–145)

## 2018-04-25 LAB — I-STAT BETA HCG BLOOD, ED (MC, WL, AP ONLY): I-stat hCG, quantitative: <5 m[IU]/mL (ref <5)

## 2018-04-25 LAB — I-STAT TROPONIN, ED: Troponin i, poc: 0 ng/mL (ref 0.00–0.08)

## 2018-04-25 MED ORDER — LOSARTAN POTASSIUM 50 MG PO TABS: 50.0000 mg | ORAL_TABLET | Freq: Every day | ORAL | 1 refills | Status: DC

## 2018-04-25 MED ORDER — POTASSIUM CHLORIDE CRYS ER 20 MEQ PO TBCR
40.0000 meq | EXTENDED_RELEASE_TABLET | Freq: Once | ORAL | Status: AC
  Administered 2018-04-25: 40 meq via ORAL
  Filled 2018-04-25: qty 2

## 2018-04-25 MED ORDER — POTASSIUM CHLORIDE ER 10 MEQ PO TBCR: 10.0000 meq | EXTENDED_RELEASE_TABLET | Freq: Every day | ORAL | 0 refills | Status: DC

## 2018-04-25 NOTE — ED Triage Notes (Signed)
Pt states she got a little much vitamin d and the palpations and that's when this started.

## 2018-04-25 NOTE — ED Notes (Signed)
PT states understanding of care given, follow up care, and medication prescribed. Pt has no more questions at this time.  PT  ambulated from ED to car with a steady gait.  

## 2018-04-25 NOTE — ED Notes (Signed)
Tina Horton and theresa, cma report patient refused staff transport and family to take her

## 2018-04-25 NOTE — ED Provider Notes (Addendum)
[facility] EMERGENCY DEPARTMENT Provider Note   CSN: [id number] Arrival date & time: 04/25/18  1942     History   Chief Complaint Chief Complaint  Patient presents with  . Chest Pain    HPI Tina Horton is a 52 y.o. female.  HPI  52 year old female, history of bronchitis, diabetes, acid reflux, the patient does have some issues with being legally blind and wears some corrective lenses, she has hypertension, she states that over the last 2 days she has had some nasal drainage and congestion with sneezing, she went to the pharmacy to ask for medication possibilities and they told her not to take anything because it could raise her blood pressure.  They also told her not to take anything because it might cause palpitations.  She has started to have some palpitations today, and last 2 to 5 seconds, she went to the urgent care who checked her potassium at 3.2 and referred her here.  The patient had no prior history of coronary disease.  She was told that because of her low potassium and palpitation she may be having a heart attack and was sent here.  Past Medical History:  Diagnosis Date  . Anemia   . Arthritis    knees  . Bronchitis   . Diabetes mellitus without complication [facility])    age 4yo  . GERD (gastroesophageal reflux disease)   . Glaucoma    Dr. Einar Gip  . H/O mammogram 2005  . Headache   . Heart murmur   . Hyperlipidemia   . Hypertension   . Intermittent palpitations   . Legally blind   . Myopia   . Pneumonia   . Routine gynecological examination    last pap 2005  . Seasonal allergies   . Shortness of breath dyspnea   . Sinusitis   . Thyroid nodule     Patient Active Problem List   Diagnosis Date Noted  . 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 Vitamin D deficiency 03/13/2018  . Chronic kidney disease 01/20/2018  . Primary osteoarthritis of both knees 01/20/2018  . DDD (degenerative disc disease), lumbar 01/20/2018  . Localized swelling,  mass or lump of neck 10/30/2017  . Microalbuminuria 08/26/2017  . Mass of neck 08/26/2017  . Screening for cancer 08/20/2017  . Left leg pain 08/20/2017  . Respiratory tract infection 08/20/2017  . Nausea 08/20/2017  . Renal cyst 01/15/2016  . Hydronephrosis 01/15/2016  . Spinal stenosis 01/15/2016  . Anemia, iron deficiency 12/04/2015  . History of burning pain in leg 12/04/2015  . S/P laparoscopic assisted vaginal hysterectomy (LAVH) 09/13/2015  . Encounter for health maintenance examination in adult 08/21/2015  . Hyperlipidemia 08/21/2015  . Glaucoma 08/21/2015  . Heart murmur 08/21/2015  . Thyroid nodule 08/21/2015  . Vaccine refused by patient 08/21/2015  . Noncompliance 03/21/2015  . Essential hypertension 03/21/2015  . Uncontrolled diabetes mellitus with complication, with long-term current use of insulin ([place]) 03/21/2015  . Lymphadenitis 03/21/2015  . SKIN LESION 10/19/2008  . SYSTOLIC MURMUR 74/04/8785  . GLAUCOMA 07/17/2006  . VISUAL DISTURBANCE NOS 07/17/2006  . REFLUX ESOPHAGITIS 07/17/2006    Past Surgical History:  Procedure Laterality Date  . CYSTECTOMY     umbilicus  . INCISION AND DRAINAGE     abdominal superficial abscess  . LAPAROSCOPIC VAGINAL HYSTERECTOMY WITH SALPINGECTOMY Bilateral 09/13/2015   Procedure: LAPAROSCOPIC ASSISTED VAGINAL HYSTERECTOMY WITH SALPINGECTOMY, McCalls Colpoplasty;  Surgeon: Thurnell Lose, MD;  Location: [facility];  Service: Gynecology;  Laterality:  Bilateral;  . LASIK     x2     OB History   None      Home Medications    Prior to Admission medications   Medication Sig Start Date End Date Taking? Authorizing Provider  aspirin EC 81 MG tablet Take 1 tablet (81 mg total) by mouth daily. 08/21/17  Yes Tysinger, Camelia Eng, PA-C  furosemide (LASIX) 20 MG tablet Take 20 mg by mouth daily.   Yes [provider]  insulin aspart (NOVOLOG FLEXPEN) 100 UNIT/ML FlexPen Inject 20 Units into the skin 3 (three) times daily with  meals. Use sliding scale we provided 01/21/18  Yes Tysinger, Camelia Eng, PA-C  Insulin Glargine (LANTUS) 100 UNIT/ML Solostar Pen Inject 50 Units into the skin daily at 10 pm. 01/21/18  Yes Tysinger, Camelia Eng, PA-C  latanoprost (XALATAN) 0.005 % ophthalmic solution Place 1 drop into both eyes at bedtime.   Yes [provider]  rosuvastatin (CRESTOR) 10 MG tablet Take 1 tablet (10 mg total) by mouth at bedtime. 01/21/18 01/21/19 Yes Tysinger, Camelia Eng, PA-C  ergocalciferol (VITAMIN D2) 50000 units capsule Take 1 capsule (50,000 Units total) by mouth 2 (two) times a week. 12/22/17   Bo Merino, MD  glucose blood (PRODIGY NO CODING BLOOD GLUC) test strip USE TO TEST BLOOD SUGAR TWICE DAILY 11/18/17   Tysinger, Camelia Eng, PA-C  glucose blood (PRODIGY NO CODING BLOOD GLUC) test strip Use as instructed 11/05/17   Tysinger, Camelia Eng, PA-C  Insulin Pen Needle (NOVOFINE) 32G X 6 MM MISC USE PEN NEEDLES FOR LANTUS SOLORSTAR PEN and novolog pens 01/21/18   Tysinger, Camelia Eng, PA-C  losartan (COZAAR) 50 MG tablet Take 1 tablet (50 mg total) by mouth daily. 04/25/18   Noemi Chapel, MD  potassium chloride (K-DUR) 10 MEQ tablet Take 1 tablet (10 mEq total) by mouth daily. 04/25/18   Noemi Chapel, MD  PRODIGY TWIST TOP LANCETS 28G MISC USE TO TEST BLOOD SUGAR ONCE OR TWICE DAILY 11/18/17   Tysinger, Camelia Eng, PA-C  PRODIGY TWIST TOP LANCETS 28G MISC USE TO TEST BLOOD SUGAR ONCE OR TWICE DAILY 11/05/17   Tysinger, Camelia Eng, PA-C  traMADol-acetaminophen (ULTRACET) 37.5-325 MG tablet Take 1 tablet by mouth every 6 (six) hours as needed. Patient not taking: Reported on 04/25/2018 11/14/17   Jessy Oto, MD    Family History Family History  Problem Relation Age of Onset  . Diabetes Mother   . Hypertension Mother   . Stroke Mother   . Hypertension Father   . Chronic Renal Failure Father   . Hypertension Sister   . Hypertension Brother   . Stroke Maternal Grandmother   . Asthma Daughter   . Healthy Daughter   . Healthy  Son   . Cancer Neg Hx   . Heart disease Neg Hx     Social History Social History   Tobacco Use  . Smoking status: Never Smoker  . Smokeless tobacco: Never Used  Substance Use Topics  . Alcohol use: No    Alcohol/week: 0.0 standard drinks  . Drug use: No     Allergies   Amlodipine; Lisinopril; Metformin and related; and Tanzeum [albiglutide]   Review of Systems Review of Systems  All other systems reviewed and are negative.    Physical Exam Updated Vital Signs BP (!) 188/99   Pulse 92   Temp 99 F (37.2 C) (Oral)   Resp 19   LMP 09/13/2015   SpO2 100%   Physical Exam  Constitutional: She appears well-developed and well-nourished. No distress.  HENT:  Head: Normocephalic and atraumatic.  Mouth/Throat: Oropharynx is clear and moist. No oropharyngeal exudate.  Eyes: Pupils are equal, round, and reactive to light. Conjunctivae and EOM are normal. Right eye exhibits no discharge. Left eye exhibits no discharge. No scleral icterus.  Neck: Normal range of motion. Neck supple. No JVD present. No thyromegaly present.  Cardiovascular: Normal rate, regular rhythm, normal heart sounds and intact distal pulses. Exam reveals no gallop and no friction rub.  No murmur heard. Pulmonary/Chest: Effort normal and breath sounds normal. No respiratory distress. She has no wheezes. She has no rales.  Abdominal: Soft. Bowel sounds are normal. She exhibits no distension and no mass. There is no tenderness.  Musculoskeletal: Normal range of motion. She exhibits no edema or tenderness.  Lymphadenopathy:    She has no cervical adenopathy.  Neurological: She is alert. Coordination normal.  Skin: Skin is warm and dry. No rash noted. No erythema.  Psychiatric: She has a normal mood and affect. Her behavior is normal.  Nursing note and vitals reviewed.    ED Treatments / Results  Labs (all labs ordered are listed, but only abnormal results are displayed) Labs Reviewed  BASIC METABOLIC  PANEL - Abnormal; Notable for the following components:      Result Value   Potassium 3.0 (*)    Chloride 95 (*)    Glucose, Bld 133 (*)    Anion gap 17 (*)    All other components within normal limits  CBC  I-STAT TROPONIN, ED  I-STAT BETA HCG BLOOD, ED ([facility], [facility], [facility] ONLY)    EKG EKG Interpretation  Date/Time:  Saturday April 25 2018 19:51:54 EST Ventricular Rate:  108 PR Interval:  164 QRS Duration: 68 QT Interval:  354 QTC Calculation: 474 R Axis:   37 Text Interpretation:  Sinus tachycardia Cannot rule out Anterior infarct , age undetermined Abnormal ECG since last tracing no significant change Confirmed by Noemi Chapel [phone]) on 04/25/2018 8:35:35 PM   Radiology Dg Chest 2 View  Result Date: 04/25/2018 CLINICAL DATA:  Palpitations, chest discomfort, mild shortness of breath EXAM: CHEST - 2 VIEW COMPARISON:  None. FINDINGS: Lungs are clear.  No pleural effusion or pneumothorax. The heart is normal in size. Visualized osseous structures are within normal limits. IMPRESSION: Normal chest radiographs. Electronically Signed   By: Julian Hy M.D.   On: 04/25/2018 21:00    Procedures Procedures (including critical care time)  Medications Ordered in ED Medications  potassium chloride SA (K-DUR,KLOR-CON) CR tablet 40 mEq (40 mEq Oral Given 04/25/18 2113)     Initial Impression / Assessment and Plan / ED Course  I have reviewed the triage vital signs and the nursing notes.  Pertinent labs & imaging results that were available during my care of the patient were reviewed by me and considered in my medical decision making (see chart for details).  Clinical Course as of Apr 26 2227  Sat Apr 25, 2018  2152 Potassium checked at 3.0, no other acute findings.  The patient does have some fluctuating blood pressure.   [BM]    Clinical Course User Index [BM] Noemi Chapel, MD    The patient has a unremarkable EKG with no signs of arrhythmia or ischemia, her potassium  indeed showed 3.2, she will be given a dose of potassium, chest x-ray, check troponin.  Patient agreeable to the plan, feels well, not having palpitations or chest pain.  Has had 2  days of upper respiratory symptoms which are mild and consistent with more of a viral upper respiratory symptoms.  K replaced Other labs normal Pt encouraged to take more BP meds - increased dose of home medicine - pt agreeable.  Patient now reports that her sister also died today, this is likely more related to anxiety than anything else.   Final Clinical Impressions(s) / ED Diagnoses   Final diagnoses:  Palpitations  Anxiety  Precordial pain  Essential hypertension    ED Discharge Orders         Ordered    potassium chloride (K-DUR) 10 MEQ tablet  Daily     04/25/18 2224    losartan (COZAAR) 50 MG tablet  Daily     04/25/18 2228           Noemi Chapel, MD 04/25/18 2228    Noemi Chapel, MD 04/25/18 2228

## 2018-04-25 NOTE — ED Triage Notes (Signed)
Pt sent by [facility], c/o palpitations, chest discomfort and mild shortness of breath.

## 2018-04-25 NOTE — ED Provider Notes (Signed)
[facility]    CSN: [id number] Arrival date & time: 04/25/18  1448     History   Chief Complaint Chief Complaint  Patient presents with  . Palpitations    HPI Tina Horton is a 52 y.o. female.   Who has hx of HTN, DM and chronic palpitations saying that in the past 2 days they have been worse and admits being under a lot of stress. She also developed a cold and when she spoke with the pharmacist of what she could take, and since she was having palpitations, was told to be seen. She was told her vit D was high by her Rheumatologist and pt read, that high Vit D can cause palpitations. She has not taken any in the past month. She has has had a mild ache in her chest when she felt the palpitatoins, but not heavy weight and denies SOB, sweating, L neck pain. Her cough and rhinitis is with clear mucous. Her appetite has been down and has not eaten today. She takes her BP at home and with a wrist monitor has been running 140/ 90's.  Pt has been told to just eat bananas to replenish her potassium, but only eats them on occasion, and has not had any in 2 weeks. She also did not want it to affect her glucose.      Past Medical History:  Diagnosis Date  . Anemia   . Arthritis    knees  . Bronchitis   . Diabetes mellitus without complication [facility])    age 71yo  . GERD (gastroesophageal reflux disease)   . Glaucoma    Dr. Einar Gip  . H/O mammogram 2005  . Headache   . Heart murmur   . Hyperlipidemia   . Hypertension   . Intermittent palpitations   . Legally blind   . Myopia   . Pneumonia   . Routine gynecological examination    last pap 2005  . Seasonal allergies   . Shortness of breath dyspnea   . Sinusitis   . Thyroid nodule     Patient Active Problem List   Diagnosis Date Noted  . 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 Vitamin D deficiency 03/13/2018  . Chronic kidney disease 01/20/2018  . Primary osteoarthritis of both knees 01/20/2018  . DDD (degenerative  disc disease), lumbar 01/20/2018  . Localized swelling, mass or lump of neck 10/30/2017  . Microalbuminuria 08/26/2017  . Mass of neck 08/26/2017  . Screening for cancer 08/20/2017  . Left leg pain 08/20/2017  . Respiratory tract infection 08/20/2017  . Nausea 08/20/2017  . Renal cyst 01/15/2016  . Hydronephrosis 01/15/2016  . Spinal stenosis 01/15/2016  . Anemia, iron deficiency 12/04/2015  . History of burning pain in leg 12/04/2015  . S/P laparoscopic assisted vaginal hysterectomy (LAVH) 09/13/2015  . Encounter for health maintenance examination in adult 08/21/2015  . Hyperlipidemia 08/21/2015  . Glaucoma 08/21/2015  . Heart murmur 08/21/2015  . Thyroid nodule 08/21/2015  . Vaccine refused by patient 08/21/2015  . Noncompliance 03/21/2015  . Essential hypertension 03/21/2015  . Uncontrolled diabetes mellitus with complication, with long-term current use of insulin ([place]) 03/21/2015  . Lymphadenitis 03/21/2015  . SKIN LESION 10/19/2008  . SYSTOLIC MURMUR 03/47/4259  . GLAUCOMA 07/17/2006  . VISUAL DISTURBANCE NOS 07/17/2006  . REFLUX ESOPHAGITIS 07/17/2006    Past Surgical History:  Procedure Laterality Date  . CYSTECTOMY     umbilicus  . INCISION AND DRAINAGE     abdominal superficial abscess  .  LAPAROSCOPIC VAGINAL HYSTERECTOMY WITH SALPINGECTOMY Bilateral 09/13/2015   Procedure: LAPAROSCOPIC ASSISTED VAGINAL HYSTERECTOMY WITH SALPINGECTOMY, McCalls Colpoplasty;  Surgeon: Thurnell Lose, MD;  Location: [facility];  Service: Gynecology;  Laterality: Bilateral;  . LASIK     x2    OB History   None      Home Medications    Prior to Admission medications   Medication Sig Start Date End Date Taking? Authorizing Provider  aspirin EC 81 MG tablet Take 1 tablet (81 mg total) by mouth daily. 08/21/17   Tysinger, Camelia Eng, PA-C  ergocalciferol (VITAMIN D2) 50000 units capsule Take 1 capsule (50,000 Units total) by mouth 2 (two) times a week. 12/22/17   Bo Merino, MD    furosemide (LASIX) 20 MG tablet Take 20 mg by mouth daily.    [provider]  glucose blood (PRODIGY NO CODING BLOOD GLUC) test strip USE TO TEST BLOOD SUGAR TWICE DAILY 11/18/17   Tysinger, Camelia Eng, PA-C  glucose blood (PRODIGY NO CODING BLOOD GLUC) test strip Use as instructed 11/05/17   Tysinger, Camelia Eng, PA-C  insulin aspart (NOVOLOG FLEXPEN) 100 UNIT/ML FlexPen Inject 20 Units into the skin 3 (three) times daily with meals. Use sliding scale we provided 01/21/18   Tysinger, Camelia Eng, PA-C  Insulin Glargine (LANTUS) 100 UNIT/ML Solostar Pen Inject 50 Units into the skin daily at 10 pm. 01/21/18   Tysinger, Camelia Eng, PA-C  Insulin Pen Needle (NOVOFINE) 32G X 6 MM MISC USE PEN NEEDLES FOR LANTUS SOLORSTAR PEN and novolog pens 01/21/18   Tysinger, Camelia Eng, PA-C  latanoprost (XALATAN) 0.005 % ophthalmic solution Place 1 drop into both eyes at bedtime.    [provider]  losartan (COZAAR) 25 MG tablet Take 25 mg by mouth daily.    [provider]  losartan (COZAAR) 50 MG tablet Take 50 mg by mouth daily.    [provider]  PRODIGY TWIST TOP LANCETS 28G MISC USE TO TEST BLOOD SUGAR ONCE OR TWICE DAILY 11/18/17   Tysinger, Camelia Eng, PA-C  PRODIGY TWIST TOP LANCETS 28G MISC USE TO TEST BLOOD SUGAR ONCE OR TWICE DAILY 11/05/17   Tysinger, Camelia Eng, PA-C  rosuvastatin (CRESTOR) 10 MG tablet Take 1 tablet (10 mg total) by mouth at bedtime. 01/21/18 01/21/19  Tysinger, Camelia Eng, PA-C  traMADol-acetaminophen (ULTRACET) 37.5-325 MG tablet Take 1 tablet by mouth every 6 (six) hours as needed. 11/14/17   Jessy Oto, MD    Family History Family History  Problem Relation Age of Onset  . Diabetes Mother   . Hypertension Mother   . Stroke Mother   . Hypertension Father   . Chronic Renal Failure Father   . Hypertension Sister   . Hypertension Brother   . Stroke Maternal Grandmother   . Asthma Daughter   . Healthy Daughter   . Healthy Son   . Cancer Neg Hx   . Heart disease Neg Hx      Social History Social History   Tobacco Use  . Smoking status: Never Smoker  . Smokeless tobacco: Never Used  Substance Use Topics  . Alcohol use: No    Alcohol/week: 0.0 standard drinks  . Drug use: No     Allergies   Amlodipine; Lisinopril; Metformin and related; and Tanzeum [albiglutide]   Review of Systems Review of Systems  Constitutional: Positive for fatigue. Negative for chills, diaphoresis and fever.  HENT: Positive for postnasal drip and rhinorrhea. Negative for ear discharge, ear pain, mouth sores, sore  throat and trouble swallowing.   Respiratory: Positive for cough. Negative for chest tightness, shortness of breath and wheezing.   Cardiovascular: Positive for palpitations. Negative for chest pain and leg swelling.  Musculoskeletal: Positive for neck pain. Negative for gait problem.       Has chronic neck posteriorly.  Gets calf cramps and crams in the arches of her feet.   Skin: Negative for rash.  Hematological: Negative for adenopathy.  Psychiatric/Behavioral: The patient is nervous/anxious.      Physical Exam Triage Vital Signs ED Triage Vitals [04/25/18 1508]  Enc Vitals Group     BP (!) 181/79     Pulse Rate 85     Resp 18     Temp 97.9 F (36.6 C)     Temp src      SpO2 100 %     Weight 148 lb (67.1 kg)     Height      Head Circumference      Peak Flow      Pain Score      Pain Loc      Pain Edu?      Excl. in [place]?    I repeated her BP after sh laid on her L side and was 209/98 Denies chest aching.  Updated Vital Signs BP (!) 181/79 (BP Location: Left Arm)   Pulse 85   Temp 97.9 F (36.6 C)   Resp 18   Wt 148 lb (67.1 kg)   LMP 09/13/2015   SpO2 100%   BMI 27.96 kg/m   Visual Acuity Right Eye Distance:   Left Eye Distance:   Bilateral Distance:    Right Eye Near:   Left Eye Near:    Bilateral Near:     Physical Exam Constitutional: She is oriented to person, place, and time. She appears well-developed and  well-nourished. No distress.  HENT: both TM's clear, nose with clear mucous. Pharynx is clear.  Head: Normocephalic and atraumatic.  Right Ear: External ear normal.  Left Ear: External ear normal.  Nose: Nose normal.  Eyes: Conjunctivae are normal. Right eye exhibits no discharge. Left eye exhibits no discharge. No scleral icterus.  Neck: Neck supple. No thyromegaly present.  No carotid bruits bilaterally  Cardiovascular: Normal rate and regular rhythm. No murmur heard. Pulmonary/Chest: Effort normal and breath sounds normal. No respiratory distress.  Musculoskeletal: Normal range of motion. She exhibits no edema.  Lymphadenopathy:    She has no cervical adenopathy.  Neurological: She is alert and oriented to person, place, and time.  Skin: Skin is warm and dry. Capillary refill takes less than 2 seconds. No rash noted. She is not diaphoretic.  Psychiatric: affect is flat. Her behavior is normal. Judgment and thought content normal.  Nursing note reviewed.  UC Treatments / Results  Labs (all labs ordered are listed, but only abnormal results are displayed) Labs Reviewed  I-STAT CHEM 8, ED   EKG Unchanged when compared with EKG from 08/21/15 which was viewed with the help of Eliberto Ivory. FNP. Lead one shows slightly peaked T waves, but may be due to her potasium.   Radiology No results found.  Medications Ordered in UC Medications - No data to display  Initial Impression / Assessment and Plan / UC Course  I have reviewed the triage vital signs and the nursing notes. I reviewed her last Vit D from 10/31 and was 81, and her last potasium during the same date was low normal. Today's potasium is down again  to 3.2 She was explained that her EKG today shows unchanged from prior one. But since her BP is worse, she was sent to ER. She is asymptomatic, so I was OK with husband driving her across the street.   She may use Flonase to help with stuffy nose and post nasal drainage She was also  advised to do saline nose rinses bid for 3-5 days.   Final Clinical Impressions(s) / UC Diagnoses   Final diagnoses:  None   Discharge Instructions   None    ED Prescriptions    None     Controlled Substance Prescriptions [place] Controlled Substance Registry consulted?    Shelby Mattocks, [place] 04/25/18 1958

## 2018-04-25 NOTE — Discharge Instructions (Addendum)
YOur testing showed a low potassium - we have given you some medicine here but you will need to take this for the next 5 days - ER for worsening symptoms.  Please increase the blood pressure medicine to 50 mg a day, I have given you a new prescription for this.  See your doctor in 2 weeks for a recheck of your blood pressure

## 2018-04-25 NOTE — Discharge Instructions (Addendum)
Your blood pressure is still high, and I recommend you go to the emergency room to have more tests done. Your potasium came back a little low at 3.2, normal is 3.5-5.1  Do saline nose rinses with a [employer] which is over the counter twice a day to help with nose congestion. Do this for 3-5 days.  You may also get Flonase nose spray and use 2 sprays each nostril every day for 5-7 days for post nasal drainage as needed.

## 2018-04-27 ENCOUNTER — Telehealth: Payer: Self-pay | Admitting: Medical

## 2018-04-27 NOTE — Telephone Encounter (Signed)
She sent me message through my chart but I think she has since been to the hospital.   Call and see what questions she has?

## 2018-04-27 NOTE — Telephone Encounter (Signed)
Patient states that she does not have any questions, she has been to the ED.

## 2018-04-28 ENCOUNTER — Ambulatory Visit: Payer: BLUE CROSS/BLUE SHIELD | Admitting: Endocrinology

## 2018-05-06 ENCOUNTER — Ambulatory Visit: Payer: BLUE CROSS/BLUE SHIELD | Admitting: Cardiovascular Disease

## 2018-06-01 ENCOUNTER — Ambulatory Visit: Payer: BLUE CROSS/BLUE SHIELD | Admitting: Endocrinology

## 2018-06-29 ENCOUNTER — Ambulatory Visit: Payer: BLUE CROSS/BLUE SHIELD | Admitting: Endocrinology

## 2018-09-17 ENCOUNTER — Other Ambulatory Visit: Payer: Self-pay | Admitting: Medical

## 2018-09-24 ENCOUNTER — Ambulatory Visit (INDEPENDENT_AMBULATORY_CARE_PROVIDER_SITE_OTHER): Payer: BLUE CROSS/BLUE SHIELD | Admitting: Specialist

## 2018-11-22 ENCOUNTER — Other Ambulatory Visit: Payer: Self-pay | Admitting: Medical

## 2018-12-03 ENCOUNTER — Other Ambulatory Visit: Payer: Self-pay | Admitting: Medical

## 2019-01-11 ENCOUNTER — Other Ambulatory Visit: Payer: Self-pay | Admitting: Medical

## 2019-01-27 ENCOUNTER — Other Ambulatory Visit: Payer: Self-pay | Admitting: Medical

## 2019-01-27 NOTE — Telephone Encounter (Signed)
Schedule her for diabetes follow-up or physical, whichever is due, and refill medications for 30 days

## 2019-01-28 NOTE — Telephone Encounter (Signed)
Patient insurance is not in network. She has another pcp

## 2019-03-09 ENCOUNTER — Other Ambulatory Visit: Payer: Self-pay | Admitting: Medical

## 2019-03-10 ENCOUNTER — Other Ambulatory Visit: Payer: Self-pay | Admitting: Medical

## 2019-10-25 ENCOUNTER — Other Ambulatory Visit: Payer: Self-pay | Admitting: Medical

## 2019-11-23 ENCOUNTER — Telehealth: Payer: Self-pay | Admitting: Medical

## 2019-11-23 NOTE — Telephone Encounter (Signed)
Pt is scheduled for Diabetes check on 11-29-19

## 2019-11-23 NOTE — Telephone Encounter (Signed)
Please call patient.  I think her last visit was 2019.  She is due for a physical and fasting labs. I received a refill request on medications but I am not sure she is taking any of them  If she has transferred to another office then please document this and remove me as PCP.  Find out if she is seeing a diabetes specialist? (Of note she has been an uncontrolled diabetic and was not very compliant)

## 2019-11-29 ENCOUNTER — Ambulatory Visit (INDEPENDENT_AMBULATORY_CARE_PROVIDER_SITE_OTHER): Payer: Medicare HMO | Admitting: Medical

## 2019-11-29 ENCOUNTER — Other Ambulatory Visit: Payer: Self-pay

## 2019-11-29 ENCOUNTER — Encounter: Payer: Self-pay | Admitting: Medical

## 2019-11-29 VITALS — BP 192/80 | HR 102 | Ht 61.0 in | Wt 156.6 lb

## 2019-11-29 DIAGNOSIS — D509 Iron deficiency anemia, unspecified: Secondary | ICD-10-CM | POA: Diagnosis not present

## 2019-11-29 DIAGNOSIS — N182 Chronic kidney disease, stage 2 (mild): Secondary | ICD-10-CM

## 2019-11-29 DIAGNOSIS — H409 Unspecified glaucoma: Secondary | ICD-10-CM | POA: Diagnosis not present

## 2019-11-29 DIAGNOSIS — E118 Type 2 diabetes mellitus with unspecified complications: Secondary | ICD-10-CM | POA: Diagnosis not present

## 2019-11-29 DIAGNOSIS — E559 Vitamin D deficiency, unspecified: Secondary | ICD-10-CM | POA: Diagnosis not present

## 2019-11-29 DIAGNOSIS — N1330 Unspecified hydronephrosis: Secondary | ICD-10-CM

## 2019-11-29 DIAGNOSIS — Z2820 Immunization not carried out because of patient decision for unspecified reason: Secondary | ICD-10-CM

## 2019-11-29 DIAGNOSIS — Z1231 Encounter for screening mammogram for malignant neoplasm of breast: Secondary | ICD-10-CM

## 2019-11-29 DIAGNOSIS — I10 Essential (primary) hypertension: Secondary | ICD-10-CM | POA: Diagnosis not present

## 2019-11-29 DIAGNOSIS — Z794 Long term (current) use of insulin: Secondary | ICD-10-CM

## 2019-11-29 DIAGNOSIS — E785 Hyperlipidemia, unspecified: Secondary | ICD-10-CM | POA: Diagnosis not present

## 2019-11-29 DIAGNOSIS — Q6210 Congenital occlusion of ureter, unspecified: Secondary | ICD-10-CM | POA: Insufficient documentation

## 2019-11-29 DIAGNOSIS — Z91199 Patient's noncompliance with other medical treatment and regimen due to unspecified reason: Secondary | ICD-10-CM

## 2019-11-29 DIAGNOSIS — IMO0002 Reserved for concepts with insufficient information to code with codable children: Secondary | ICD-10-CM

## 2019-11-29 DIAGNOSIS — E1165 Type 2 diabetes mellitus with hyperglycemia: Secondary | ICD-10-CM

## 2019-11-29 DIAGNOSIS — R809 Proteinuria, unspecified: Secondary | ICD-10-CM | POA: Diagnosis not present

## 2019-11-29 DIAGNOSIS — Z9119 Patient's noncompliance with other medical treatment and regimen: Secondary | ICD-10-CM

## 2019-11-29 NOTE — Progress Notes (Signed)
Subjective: Chief Complaint  Patient presents with  . Diabetes   Last visit here 2019.  We called her requesting a follow-up since she had not been in.   She notes that she had been seeing [facility] last year with [employer] insurance, had to change providers last year due to insurance issues.   She has a history of noncompliance, insulin dependent diabetes, uncontrolled hypertension, hyperlipidemia, GERD, glaucoma, arthritis, anemia, chronic left hydronephrosis, congenital degenerative eye disease, vit D deficiency, DDD with spinal stenosis, +ANA, anxiety, legally blind, and in the past has been reluctant to use certain medications.  She did ask our front office that she did not want to be asked about the coronavirus that she refuses to get this.  Here today for med check  Medical team: Dr. Thereasa Distance, nephrology, prior Dr. Lawson Radar [facility] Dr. Raynelle Fanning, ophthalmology Endocrinology - no showed 2020 appt  Diabetes-she has been using NovoLog mealtime insulin 14-16 units on average per meals.  She has been using Levemir 34 units nightly.  Her sugars have been running high 100s to over 200 fasting.  She was referred to endocrinology this past year by [facility] but did not end up making that appointment.  She would like to get the Dexcom glucose monitoring system.  She was on the freestyle libre in the past but had a reaction to the adhesive patch  Not currently on any cholesterol medicine  Hypertension-currently on losartan 100 mg daily.  She notes that another medicine was added this past year but she had a reaction so she ended up stopping this.  She is also not tolerated amlodipine in the past  She has a history of chronic kidney disease and chronic hydronephrosis of kidney.  Last visit with kidney doctor about a year ago.  She is establishing back with [facility] here in [place].  She did see a nephrologist through [facility] last year  Regarding bony  deformity noticed on exam of the right ankle, she notes injury to her ankle numerous years ago as a teenager.  She denies any particular issues with her ankle no pain.  She still sees her eye doctor regularly.  She had seen the urologist in the past.  They advised that her chronic kidney issue would not want removing the kidney or doing anything from their perspective.  She notes that urology told her to continue to see her kidney doctor for long-term management   Past Medical History:  Diagnosis Date  . Anemia   . Arthritis    knees  . Bronchitis   . Diabetes mellitus without complication [facility])    age 54yo  . GERD (gastroesophageal reflux disease)   . Glaucoma    Dr. Einar Gip  . H/O mammogram 2005  . Headache   . Heart murmur   . Hyperlipidemia   . Hypertension   . Intermittent palpitations   . Legally blind   . Myopia   . Pneumonia   . Routine gynecological examination    last pap 2005  . Seasonal allergies   . Shortness of breath dyspnea   . Sinusitis   . Thyroid nodule     Current Outpatient Medications on File Prior to Visit  Medication Sig Dispense Refill  . aspirin EC 81 MG tablet Take 1 tablet (81 mg total) by mouth daily. 90 tablet 3  . ergocalciferol (VITAMIN D2) 50000 units capsule Take 1 capsule (50,000 Units total) by mouth 2 (two) times a week. 24  capsule 0  . glucose blood (PRODIGY NO CODING BLOOD GLUC) test strip USE TO TEST BLOOD SUGAR TWICE DAILY 100 each 0  . glucose blood (PRODIGY NO CODING BLOOD GLUC) test strip Use as instructed 100 each 12  . insulin detemir (LEVEMIR FLEXTOUCH) 100 UNIT/ML FlexPen INJECT 30 UNITS INTO THE SKIN EVERY EVENING    . Insulin Pen Needle (NOVOFINE) 32G X 6 MM MISC USE FOUR TIMES A DAY FOR LANTUS SOLOSTAR AND NOVOLOG PENS 200 each 10  . latanoprost (XALATAN) 0.005 % ophthalmic solution Place 1 drop into both eyes at bedtime.    [name] losartan (COZAAR) 50 MG tablet Take 1 tablet (50 mg total) by mouth daily. 30 tablet 1  .  NOVOLOG FLEXPEN 100 UNIT/ML FlexPen INJECT 20 UNITS INTO THE SKIN THREE TIMES A DAY WITH MEALS *USE SLIDING SCALE PROVIDED BY MD* 15 mL 0  . PRODIGY TWIST TOP LANCETS 28G MISC USE TO TEST BLOOD SUGAR ONCE OR TWICE DAILY 100 each 0  . traMADol-acetaminophen (ULTRACET) 37.5-325 MG tablet Take 1 tablet by mouth every 6 (six) hours as needed. (Patient not taking: Reported on 04/25/2018) 30 tablet 0   No current facility-administered medications on file prior to visit.   ROS as in subjective   Objective: BP (!) 192/80   Pulse (!) 102   Ht 5\' 1"  (1.549 m)   Wt 156 lb 9.6 oz (71 kg)   LMP 09/13/2015   SpO2 97%   BMI 29.59 kg/m   Wt Readings from Last 3 Encounters:  11/29/19 156 lb 9.6 oz (71 kg)  04/25/18 148 lb (67.1 kg)  03/26/18 150 lb (68 kg)   BP Readings from Last 3 Encounters:  11/29/19 (!) 192/80  04/25/18 (!) 188/99  04/25/18 (!) 180/94   General appearence: alert, no distress, WD/WN,  neck: supple, no lymphadenopathy, no thyromegaly, no masses, no bruits Heart: Right around 100 bpm, RRR, normal S1, S2, no murmurs Lungs: CTA bilaterally, no wheezes, rhonchi, or rales Pulses: 2+ symmetric, upper and lower extremities, normal cap refill No lower extremity edema, few mild varicose veins of the lower extremities There is a roundish raised bony deformity of the distal right leg likely projecting from distal lateral fibula, anterior lateral ankle, suggestive of possible prior bony injury.  However foot and ankle range of motion is normal without pain   Diabetic Foot Exam - Simple   Simple Foot Form Diabetic Foot exam was performed with the following findings: Yes 11/29/2019 10:11 AM  Visual Inspection No deformities, no ulcerations, no other skin breakdown bilaterally: Yes Sensation Testing Intact to touch and monofilament testing bilaterally: Yes Pulse Check Posterior Tibialis and Dorsalis pulse intact bilaterally: Yes Comments     Assessment: Encounter Diagnoses  Name  Primary?  [name] Uncontrolled diabetes mellitus with complication, with long-term current use of insulin ([place]) Yes  . Essential hypertension   . Hyperlipidemia, unspecified hyperlipidemia type   . Noncompliance   . Microalbuminuria   . Vitamin D deficiency   . Iron deficiency anemia, unspecified iron deficiency anemia type   . Vaccine refused by patient   . Glaucoma, unspecified glaucoma type, unspecified laterality   . CKD (chronic kidney disease) stage 2, GFR 60-89 ml/min   . Ureteral stenosis   . Hydronephrosis of left kidney   . Encounter for screening mammogram for malignant neoplasm of breast      Plan: I have not seen her in over a year.  Due to insurance change she was seen primary care in  nephrology through [facility] this past year.  She tells me today she does not plan to go back to the [facility] system.  They had referred her to endocrinology this past year but she did not go  We will make referrals to endocrinology and nephrology today.  She has seen [facility] in the past which is where we will refer  CKD, chronic hydronephrosis I reviewed her September 2020 nephrology notes in the chart record through [facility].  At that time her creatinine was stable but they added labetalol 200 mg twice daily for blood pressure control when she was uncontrolled.  They restarted vitamin D 2000 units daily, and she states she is taking supplement  I reviewed 2019 notes from [place], Dr. Lawson Radar. Right primarily functioning kidney.  Left only functioning 12%   Updated labs today  Hypertension-not controlled.  Pending labs will likely add back a different beta-blocker since she did not like the way labetalol made her feel.  She is allergic to amlodipine.   Hyperlipidemia-we discussed the need to be back on a statin or cholesterol medicine as part of standard of care for prevention.  Follow-up pending labs  Vitamin D deficiency-advise she  continue supplement, labs today   Iron deficiency anemia-updated labs today  Advise she schedule updated mammogram   Orlene was seen today for diabetes.  Diagnoses and all orders for this visit:  Uncontrolled diabetes mellitus with complication, with long-term current use of insulin (H[facility]) -     Hepatic function panel -     Hemoglobin A1c -     TSH -     Ambulatory referral to Endocrinology  Essential hypertension  Hyperlipidemia, unspecified hyperlipidemia type -     Lipid panel  Noncompliance  Microalbuminuria -     Renal Function Panel -     CBC with Differential/Platelet  Vitamin D deficiency  Iron deficiency anemia, unspecified iron deficiency anemia type -     TSH -     Iron  Vaccine refused by patient  Glaucoma, unspecified glaucoma type, unspecified laterality  CKD (chronic kidney disease) stage 2, GFR 60-89 ml/min -     Renal Function Panel -     CBC with Differential/Platelet -     Ambulatory referral to Nephrology  Ureteral stenosis  Hydronephrosis of left kidney -     Renal Function Panel -     CBC with Differential/Platelet  Encounter for screening mammogram for malignant neoplasm of breast -     MM DIGITAL SCREENING BILATERAL; Future   Follow-up pending labs

## 2019-11-30 ENCOUNTER — Telehealth: Payer: Self-pay | Admitting: Medical

## 2019-11-30 ENCOUNTER — Other Ambulatory Visit: Payer: Self-pay | Admitting: Medical

## 2019-11-30 LAB — CBC WITH DIFFERENTIAL/PLATELET
Basophils Absolute: 0 10*3/uL (ref 0.0–0.2)
Basos: 1 %
EOS (ABSOLUTE): 0.2 10*3/uL (ref 0.0–0.4)
Eos: 3 %
Hematocrit: 39 % (ref 34.0–46.6)
Hemoglobin: 12.8 g/dL (ref 11.1–15.9)
Immature Grans (Abs): 0 10*3/uL (ref 0.0–0.1)
Immature Granulocytes: 0 %
Lymphocytes Absolute: 2.2 10*3/uL (ref 0.7–3.1)
Lymphs: 33 %
MCH: 29.1 pg (ref 26.6–33.0)
MCHC: 32.8 g/dL (ref 31.5–35.7)
MCV: 89 fL (ref 79–97)
Monocytes Absolute: 0.4 10*3/uL (ref 0.1–0.9)
Monocytes: 6 %
Neutrophils Absolute: 3.7 10*3/uL (ref 1.4–7.0)
Neutrophils: 57 %
Platelets: 287 10*3/uL (ref 150–450)
RBC: 4.4 x10E6/uL (ref 3.77–5.28)
RDW: 12.3 % (ref 11.7–15.4)
WBC: 6.5 10*3/uL (ref 3.4–10.8)

## 2019-11-30 LAB — RENAL FUNCTION PANEL
Albumin: 4.8 g/dL (ref 3.8–4.9)
BUN/Creatinine Ratio: 16 (ref 9–23)
BUN: 14 mg/dL (ref 6–24)
CO2: 25 mmol/L (ref 20–29)
Calcium: 10.2 mg/dL (ref 8.7–10.2)
Chloride: 100 mmol/L (ref 96–106)
Creatinine, Ser: 0.85 mg/dL (ref 0.57–1.00)
GFR calc Af Amer: 90 mL/min/{1.73_m2} (ref >59)
GFR calc non Af Amer: 78 mL/min/{1.73_m2} (ref >59)
Glucose: 191 mg/dL — ABNORMAL HIGH (ref 65–99)
Phosphorus: 3.7 mg/dL (ref 3.0–4.3)
Potassium: 3.8 mmol/L (ref 3.5–5.2)
Sodium: 141 mmol/L (ref 134–144)

## 2019-11-30 LAB — HEPATIC FUNCTION PANEL
ALT: 22 IU/L (ref 0–32)
AST: 17 IU/L (ref 0–40)
Alkaline Phosphatase: 109 IU/L (ref 48–121)
Bilirubin Total: 0.6 mg/dL (ref 0.0–1.2)
Bilirubin, Direct: 0.15 mg/dL (ref 0.00–0.40)
Total Protein: 7.4 g/dL (ref 6.0–8.5)

## 2019-11-30 LAB — LIPID PANEL
Chol/HDL Ratio: 3.9 ratio (ref 0.0–4.4)
Cholesterol, Total: 225 mg/dL — ABNORMAL HIGH (ref 100–199)
HDL: 58 mg/dL (ref >39)
LDL Chol Calc (NIH): 149 mg/dL — ABNORMAL HIGH (ref 0–99)
Triglycerides: 102 mg/dL (ref 0–149)
VLDL Cholesterol Cal: 18 mg/dL (ref 5–40)

## 2019-11-30 LAB — TSH: TSH: 1.32 u[IU]/mL (ref 0.450–4.500)

## 2019-11-30 LAB — IRON: Iron: 84 ug/dL (ref 27–159)

## 2019-11-30 LAB — HEMOGLOBIN A1C
Est. average glucose Bld gHb Est-mCnc: 203 mg/dL
Hgb A1c MFr Bld: 8.7 % — ABNORMAL HIGH (ref 4.8–5.6)

## 2019-11-30 MED ORDER — ASPIRIN EC 81 MG PO TBEC: 81.0000 mg | DELAYED_RELEASE_TABLET | Freq: Every day | ORAL | 3 refills | Status: AC

## 2019-11-30 MED ORDER — ATENOLOL 25 MG PO TABS: 25.0000 mg | ORAL_TABLET | Freq: Every day | ORAL | 0 refills | Status: DC

## 2019-11-30 MED ORDER — NOVOFINE 32G X 6 MM MISC: 10 refills | Status: DC

## 2019-11-30 MED ORDER — LEVEMIR FLEXTOUCH 100 UNIT/ML ~~LOC~~ SOPN: 40.0000 [IU] | PEN_INJECTOR | Freq: Every day | SUBCUTANEOUS | 0 refills | Status: DC

## 2019-11-30 MED ORDER — LOSARTAN POTASSIUM 50 MG PO TABS: 50.0000 mg | ORAL_TABLET | Freq: Every day | ORAL | 3 refills | Status: DC

## 2019-11-30 MED ORDER — ROSUVASTATIN CALCIUM 20 MG PO TABS: 20.0000 mg | ORAL_TABLET | Freq: Every day | ORAL | 3 refills | Status: DC

## 2019-11-30 MED ORDER — NOVOLOG FLEXPEN 100 UNIT/ML ~~LOC~~ SOPN: PEN_INJECTOR | SUBCUTANEOUS | 0 refills | Status: DC

## 2019-11-30 MED ORDER — DEXCOM G4 SENSOR MISC: 1.0000 "application " | Freq: Three times a day (TID) | 5 refills | Status: DC

## 2019-11-30 NOTE — Telephone Encounter (Signed)
Please send the refills

## 2019-11-30 NOTE — Telephone Encounter (Signed)
Pt changing to Tina Horton and needs these rx's sent to  [facility]  prodigy twist top lancets Prodigy test strips Novo fine needles 32g alcohol swabs  Also losartan supposed to be 100mg  ( unlesss you meant to change since you added other med)    90 day supplies

## 2019-12-01 ENCOUNTER — Other Ambulatory Visit: Payer: Self-pay

## 2019-12-01 MED ORDER — PRODIGY NO CODING BLOOD GLUC VI STRP: ORAL_STRIP | 0 refills | Status: DC

## 2019-12-01 MED ORDER — ALCOHOL SWABS PADS: MEDICATED_PAD | 0 refills | Status: DC

## 2019-12-01 MED ORDER — PRODIGY TWIST TOP LANCETS 28G MISC: 0 refills | Status: DC

## 2019-12-01 NOTE — Telephone Encounter (Signed)
Please advise whether patient should be taking Losartan 50 mg or 100 mg . 50 mg was sent to the pharmacy.

## 2019-12-02 NOTE — Telephone Encounter (Signed)
Please please call this into the pharmacy.  She is legally blind.  Please include test strips, and whenever supplies goes with the program.  She checks 3 times daily currently,  uncontrolled diabetic  I do not have an electronic order so this needs to be called in

## 2019-12-02 NOTE — Telephone Encounter (Signed)
Pt. Called back stating that she also needs the prodigy talking glucose meter called in since she is legally blind and that a PA would have to be submitted for the meter as well.

## 2019-12-02 NOTE — Telephone Encounter (Signed)
Tina Horton please advise

## 2019-12-03 ENCOUNTER — Telehealth: Payer: Self-pay

## 2019-12-03 NOTE — Telephone Encounter (Signed)
I submitted a PA for the pts. Prodigy test strips because she needs this brand to go the the prodigy talking glucose meter because of her visual impairment. It was approved by her Medicare medical Part B insurance.

## 2019-12-06 DIAGNOSIS — H5213 Myopia, bilateral: Secondary | ICD-10-CM | POA: Diagnosis not present

## 2019-12-06 NOTE — Telephone Encounter (Signed)
Gave verbal to Pharmacist for Prodigy talking meter.

## 2019-12-07 ENCOUNTER — Telehealth: Payer: Self-pay | Admitting: Medical

## 2019-12-07 ENCOUNTER — Other Ambulatory Visit: Payer: Self-pay | Admitting: Medical

## 2019-12-07 MED ORDER — LOSARTAN POTASSIUM 100 MG PO TABS
100.0000 mg | ORAL_TABLET | Freq: Every day | ORAL | 3 refills | Status: DC
Start: 2019-12-07 — End: 2020-11-06

## 2019-12-07 NOTE — Telephone Encounter (Signed)
I sent a new prescription for 100 mg losartan     FYI When she was here recently the medication log showed Losartan 50 mg when you checked her in.  She and I did not discuss changing the dose.  So I am not sure if she did not give you the correct information initially or if we overlooked this somehow.  I did update and send the dose above.

## 2019-12-07 NOTE — Telephone Encounter (Signed)
Pt states Losartan was supposed to be 100mg  qd not 50mg , please correct and send to [facility] for 90 days

## 2019-12-08 ENCOUNTER — Telehealth: Payer: Self-pay | Admitting: Internal Medicine

## 2019-12-08 MED ORDER — DEXCOM G6 TRANSMITTER MISC: 1 refills | Status: DC

## 2019-12-08 MED ORDER — DEXCOM G6 RECEIVER DEVI: 1 refills | Status: DC

## 2019-12-08 MED ORDER — DEXCOM G6 SENSOR MISC: 1 refills | Status: DC

## 2019-12-08 NOTE — Telephone Encounter (Signed)
Please call this in, as I do not know how to order this in the system.  I do not think it shows up in epic

## 2019-12-08 NOTE — Telephone Encounter (Signed)
Sent dexcom G6 to pharmacy

## 2019-12-08 NOTE — Telephone Encounter (Signed)
rx dexcom g4 glucose monitoring kit, the G4 and G5 transmitter have been discontinued. Can it be switched to G6. Send new rx to [employer]

## 2019-12-09 ENCOUNTER — Telehealth: Payer: Self-pay | Admitting: Medical

## 2019-12-09 NOTE — Telephone Encounter (Signed)
Pt states she went and walked 1.35 miles and took BP and it was 99/63.  States feels fine.  This is first day walking in about a year.  Uses this monitor every day.  12/06/19 137/82.  149/81 12/07/19.  Wanted to let you know about it being low.  Lowest 110/57 prior.  So just little concerned.  Please call her back and let her know if this is ok.

## 2019-12-09 NOTE — Telephone Encounter (Signed)
Pt called back and states that she feels low on eng ery after she walked states her blood sugar was 188 she took 15 units of insulin, states she didn't know if the low blood pressure could make her feel like she had low energy

## 2019-12-09 NOTE — Telephone Encounter (Signed)
Patient stated her blood sugar is lowered and it was up because she ate chips with salt for lunch.

## 2019-12-09 NOTE — Telephone Encounter (Signed)
See prior lab results message about referrals as she needs to get back in with endocrinology  A blood sugar in the 60s can definitely make her feel this weak, shaky, lightheaded.  So hopefully after getting some sugar intake her sugars have come back up by this point  Of note her blood pressure at her recent visit was actually high  Follow-up as needed if concerns or symptoms persist in the short-term

## 2019-12-09 NOTE — Telephone Encounter (Signed)
Patient stated he blood sugar has come back up. She ate some oatmeal to get it up.

## 2019-12-09 NOTE — Telephone Encounter (Signed)
Pt called back and said her sugar was 65. She said she was drinking orange juice and will eat some oatmeal.

## 2019-12-14 ENCOUNTER — Telehealth: Payer: Self-pay | Admitting: Medical

## 2019-12-14 NOTE — Telephone Encounter (Signed)
PRODIGY NO CODING TEST STRIPS approved by [facility] til 05/19/2020

## 2019-12-23 DIAGNOSIS — E119 Type 2 diabetes mellitus without complications: Secondary | ICD-10-CM | POA: Diagnosis not present

## 2019-12-23 DIAGNOSIS — H15833 Staphyloma posticum, bilateral: Secondary | ICD-10-CM | POA: Diagnosis not present

## 2019-12-23 DIAGNOSIS — H401134 Primary open-angle glaucoma, bilateral, indeterminate stage: Secondary | ICD-10-CM | POA: Diagnosis not present

## 2019-12-24 ENCOUNTER — Telehealth: Payer: Self-pay

## 2019-12-24 MED ORDER — NOVOFINE PLUS 32G X 4 MM MISC: 0 refills | Status: DC

## 2019-12-24 NOTE — Telephone Encounter (Signed)
Sent in 42mm needles

## 2019-12-24 NOTE — Telephone Encounter (Signed)
Received fax from [facility] for a refill on the pts Novofine plus 32g 75mm pen needle. Pt. Called stating that she received the pen needles but they were 6 mm and those are to long for her if you could send in the 4 mm for her. Pt. Last apt was 11/29/19.

## 2019-12-27 ENCOUNTER — Other Ambulatory Visit: Payer: Self-pay | Admitting: Medical

## 2019-12-28 ENCOUNTER — Telehealth: Payer: Self-pay | Admitting: Medical

## 2019-12-28 NOTE — Telephone Encounter (Signed)
Pt says [employer] called her about an hour ago & states they need a new Rx for [employer] fax or escribe [phone].  I told her we escribed yesterday.  So don't know what the issue could be advised would send you the message.

## 2019-12-30 ENCOUNTER — Other Ambulatory Visit: Payer: Self-pay

## 2019-12-30 ENCOUNTER — Telehealth: Payer: Self-pay

## 2019-12-30 MED ORDER — PRODIGY NO CODING BLOOD GLUC VI STRP: ORAL_STRIP | 0 refills | Status: DC

## 2019-12-30 MED ORDER — NOVOLOG FLEXPEN 100 UNIT/ML ~~LOC~~ SOPN: PEN_INJECTOR | SUBCUTANEOUS | 5 refills | Status: DC

## 2019-12-30 NOTE — Telephone Encounter (Signed)
Pt. Called stating that she needs a new script sent in for her prodigy test strips that says three times daily instead of two times daily with increased quantity to [facility]. Pt. Last apt was  11/29/19.

## 2019-12-30 NOTE — Telephone Encounter (Signed)
Novolog pen and strips has been sent to the pharmacy.

## 2020-01-03 ENCOUNTER — Other Ambulatory Visit: Payer: Self-pay | Admitting: Medical

## 2020-01-03 DIAGNOSIS — E559 Vitamin D deficiency, unspecified: Secondary | ICD-10-CM | POA: Diagnosis not present

## 2020-01-03 DIAGNOSIS — N182 Chronic kidney disease, stage 2 (mild): Secondary | ICD-10-CM | POA: Diagnosis not present

## 2020-01-03 DIAGNOSIS — I129 Hypertensive chronic kidney disease with stage 1 through stage 4 chronic kidney disease, or unspecified chronic kidney disease: Secondary | ICD-10-CM | POA: Diagnosis not present

## 2020-01-03 DIAGNOSIS — N189 Chronic kidney disease, unspecified: Secondary | ICD-10-CM | POA: Diagnosis not present

## 2020-01-03 DIAGNOSIS — N1330 Unspecified hydronephrosis: Secondary | ICD-10-CM | POA: Diagnosis not present

## 2020-01-03 DIAGNOSIS — D631 Anemia in chronic kidney disease: Secondary | ICD-10-CM | POA: Diagnosis not present

## 2020-01-03 DIAGNOSIS — R809 Proteinuria, unspecified: Secondary | ICD-10-CM | POA: Diagnosis not present

## 2020-01-04 ENCOUNTER — Other Ambulatory Visit: Payer: Self-pay

## 2020-01-04 ENCOUNTER — Encounter: Payer: Self-pay | Admitting: Medical

## 2020-01-04 ENCOUNTER — Ambulatory Visit (INDEPENDENT_AMBULATORY_CARE_PROVIDER_SITE_OTHER): Payer: Medicare HMO | Admitting: Medical

## 2020-01-04 VITALS — BP 190/90 | HR 84 | Ht 61.0 in | Wt 157.2 lb

## 2020-01-04 DIAGNOSIS — M170 Bilateral primary osteoarthritis of knee: Secondary | ICD-10-CM

## 2020-01-04 DIAGNOSIS — M25561 Pain in right knee: Secondary | ICD-10-CM

## 2020-01-04 DIAGNOSIS — H409 Unspecified glaucoma: Secondary | ICD-10-CM

## 2020-01-04 DIAGNOSIS — I10 Essential (primary) hypertension: Secondary | ICD-10-CM

## 2020-01-04 DIAGNOSIS — E785 Hyperlipidemia, unspecified: Secondary | ICD-10-CM

## 2020-01-04 DIAGNOSIS — Z91199 Patient's noncompliance with other medical treatment and regimen due to unspecified reason: Secondary | ICD-10-CM

## 2020-01-04 DIAGNOSIS — N182 Chronic kidney disease, stage 2 (mild): Secondary | ICD-10-CM

## 2020-01-04 DIAGNOSIS — M25562 Pain in left knee: Secondary | ICD-10-CM | POA: Diagnosis not present

## 2020-01-04 DIAGNOSIS — E559 Vitamin D deficiency, unspecified: Secondary | ICD-10-CM | POA: Diagnosis not present

## 2020-01-04 DIAGNOSIS — Z9119 Patient's noncompliance with other medical treatment and regimen: Secondary | ICD-10-CM

## 2020-01-04 DIAGNOSIS — G8929 Other chronic pain: Secondary | ICD-10-CM | POA: Insufficient documentation

## 2020-01-04 NOTE — Patient Instructions (Signed)
  Dr. Louanne Skye, orthopedics [phone]

## 2020-01-04 NOTE — Progress Notes (Signed)
Subjective: Chief Complaint  Patient presents with  . Follow-up    diabetes    Here for f/u.   Here with her husband.   Last visit I advised she add Atenolol '25mg'$  daily.   She hasn't started this.  Is housewife, legally blind.  Doesn't get out often but recently her and husband started walking.  She wanted to see if this would help the blood pressure first before starting Atenolol.  Thus, she never started Atenolol.  Still taking Losartan, but had quit this temporarily while starting her walking recently.  However, recently her knees started to hurt walking.   Thus she had to cut back on walking. Was walking on hard concrete.    She hasn't started Crestor.  Again wanted to see if walking alone with help the cholesterol.  diabetes - is doing Levemir 36 currently up from last visit but still seeing fasting sugars over 200.  Last visit we made referrals to get her back into endocrinology and nephrology.  She has appointments schedule with both specialist.  She saw nephrology yesterday, they started her on new medication which she is going to start today.  She notes this is a BP medication.   Dr. Noel Journey [facility])  From her last visit 11/2019, she was re-establishing care here.     Past Medical History:  Diagnosis Date  . Anemia   . Arthritis    knees  . Bronchitis   . Diabetes mellitus without complication [facility])    age 1yo  . GERD (gastroesophageal reflux disease)   . Glaucoma    Dr. Einar Gip  . H/O mammogram 2005  . Headache   . Heart murmur   . Hyperlipidemia   . Hypertension   . Intermittent palpitations   . Legally blind   . Myopia   . Pneumonia   . Routine gynecological examination    last pap 2005  . Seasonal allergies   . Shortness of breath dyspnea   . Sinusitis   . Thyroid nodule     Current Outpatient Medications on File Prior to Visit  Medication Sig Dispense Refill  . Alcohol Swabs PADS Use at the time of checking blood sugar 120 each 0  . aspirin  EC 81 MG tablet Take 1 tablet (81 mg total) by mouth daily. 90 tablet 3  . Blood Glucose Monitoring Suppl (PRODIGY AUTOCODE BLOOD GLUCOSE) w/Device KIT     . carvedilol (COREG) 3.125 MG tablet Take 3.125 mg by mouth 2 (two) times daily.    . Cholecalciferol 50 MCG (2000 UT) TABS Take by mouth.    . Continuous Blood Gluc Receiver (DEXCOM G6 RECEIVER) DEVI Check sugars 2-3 times daily 3 each 1  . Continuous Blood Gluc Sensor (DEXCOM G6 SENSOR) MISC Check sugars 2-3 times daily 3 each 1  . Continuous Blood Gluc Transmit (DEXCOM G6 TRANSMITTER) MISC Check sugars 2-3 times daily 3 each 1  . dorzolamide-timolol (COSOPT) 22.3-6.8 MG/ML ophthalmic solution Place 1 drop into both eyes 2 times daily.    [name] glucose blood (PRODIGY NO CODING BLOOD GLUC) test strip Use as instructed 100 each 12  . glucose blood (PRODIGY NO CODING BLOOD GLUC) test strip USE TO TEST BLOOD SUGAR THREE TIMES DAILY 300 each 0  . insulin aspart (NOVOLOG FLEXPEN) 100 UNIT/ML FlexPen INJECT 20 UNITS SUBCUTANEOUSLY THREE TIMES DAILY WITH MEALS (USE SLIDING SCALE PROVIDED BY MD)(PEN EXPIRES AT 28 DAYS) 15 mL 5  . insulin detemir (LEVEMIR FLEXTOUCH) 100 UNIT/ML FlexPen Inject 40 Units  into the skin at bedtime. 15 mL 0  . Insulin Pen Needle (NOVOFINE PLUS) 32G X 4 MM MISC Use 4 times a day with Lantus Insulin 1000 each 0  . latanoprost (XALATAN) 0.005 % ophthalmic solution Place 1 drop into both eyes at bedtime.    [name] losartan (COZAAR) 100 MG tablet Take 1 tablet (100 mg total) by mouth daily. 90 tablet 3  . Prodigy Twist Top Lancets 28G MISC USE TO TEST BLOOD SUGAR ONCE OR TWICE DAILY 100 each 0  . atenolol (TENORMIN) 25 MG tablet Take 1 tablet (25 mg total) by mouth daily. (Patient not taking: Reported on 01/04/2020) 90 tablet 0  . rosuvastatin (CRESTOR) 20 MG tablet Take 1 tablet (20 mg total) by mouth daily. (Patient not taking: Reported on 01/04/2020) 90 tablet 3   No current facility-administered medications on file prior to visit.    ROS as in subjective    Objective: BP (!) 190/90   Pulse 84   Ht '5\' 1"'$  (1.549 m)   Wt 157 lb 3.2 oz (71.3 kg)   LMP 09/13/2015   SpO2 96%   BMI 29.70 kg/m   BP Readings from Last 3 Encounters:  01/04/20 (!) 190/90  11/29/19 (!) 192/80  04/25/18 (!) 188/99   Wt Readings from Last 3 Encounters:  01/04/20 157 lb 3.2 oz (71.3 kg)  11/29/19 156 lb 9.6 oz (71 kg)  04/25/18 148 lb (67.1 kg)   General appearence: alert, no distress, WD/WN,  Heart: RRR, normal S1, S2, no murmurs MSK: small surgical scar right knee, some bony arthritis of both knees, no laxity, nontender with palpation, but some pain with knee flexion right, otherwise nontender with ROM, no swelling or other deformity Ext: no edema Pulses: 2+ symmetric, upper and lower extremities, normal cap refill    Assessment: Encounter Diagnoses  Name Primary?  . Essential hypertension Yes  . CKD (chronic kidney disease) stage 2, GFR 60-89 ml/min   . Hyperlipidemia, unspecified hyperlipidemia type   . Vitamin D deficiency   . Noncompliance   . Glaucoma, unspecified glaucoma type, unspecified laterality   . Primary osteoarthritis of both knees   . Chronic pain of both knees       Plan: HTN, CKD - noncompliance.  She is compliant with losartan but didn't start atenolol.  However, she did re-establish with [facility] since last visit here.  I will defer BP and kidney management to them  Hyperlipidemia - she declines statin so far.  I advised the need to start Crestor, guidelines in regards to diabetes care, heart disease prevention.    Vit D deficiency - she notes that she is taking vitamin D supplement  OA bilat knees from 2017 xray, ongoing pain.  Referral back to orthopedics, Dr. Louanne Skye  Diabetes  - increase levemir to 40u daily, can increase 2 units weekly.  F/u with re-establishing care with endocrinology next month.  Sakiya was seen today for follow-up.  Diagnoses and all orders for this  visit:  Essential hypertension  CKD (chronic kidney disease) stage 2, GFR 60-89 ml/min  Hyperlipidemia, unspecified hyperlipidemia type  Vitamin D deficiency  Noncompliance  Glaucoma, unspecified glaucoma type, unspecified laterality  Primary osteoarthritis of both knees -     Ambulatory referral to Orthopedic Surgery  Chronic pain of both knees -     Ambulatory referral to Orthopedic Surgery  Follow-up pending referrals

## 2020-01-04 NOTE — Progress Notes (Signed)
LM requesting records from [facility] and faxed them your notes on the pt.

## 2020-01-04 NOTE — Progress Notes (Signed)
Referral has been placed. 

## 2020-01-07 ENCOUNTER — Ambulatory Visit: Payer: BLUE CROSS/BLUE SHIELD | Admitting: Specialist

## 2020-01-07 ENCOUNTER — Ambulatory Visit (INDEPENDENT_AMBULATORY_CARE_PROVIDER_SITE_OTHER): Payer: Medicare HMO

## 2020-01-07 ENCOUNTER — Ambulatory Visit: Payer: Self-pay

## 2020-01-07 ENCOUNTER — Other Ambulatory Visit: Payer: Self-pay

## 2020-01-07 ENCOUNTER — Encounter: Payer: Self-pay | Admitting: Specialist

## 2020-01-07 VITALS — BP 204/89 | HR 89 | Ht 61.0 in | Wt 157.2 lb

## 2020-01-07 DIAGNOSIS — R2241 Localized swelling, mass and lump, right lower limb: Secondary | ICD-10-CM

## 2020-01-07 DIAGNOSIS — M25562 Pain in left knee: Secondary | ICD-10-CM | POA: Diagnosis not present

## 2020-01-07 DIAGNOSIS — M25561 Pain in right knee: Secondary | ICD-10-CM

## 2020-01-07 DIAGNOSIS — M76899 Other specified enthesopathies of unspecified lower limb, excluding foot: Secondary | ICD-10-CM

## 2020-01-07 DIAGNOSIS — D1621 Benign neoplasm of long bones of right lower limb: Secondary | ICD-10-CM | POA: Diagnosis not present

## 2020-01-07 DIAGNOSIS — M2241 Chondromalacia patellae, right knee: Secondary | ICD-10-CM | POA: Diagnosis not present

## 2020-01-07 DIAGNOSIS — M5136 Other intervertebral disc degeneration, lumbar region: Secondary | ICD-10-CM

## 2020-01-07 DIAGNOSIS — M2242 Chondromalacia patellae, left knee: Secondary | ICD-10-CM | POA: Diagnosis not present

## 2020-01-07 MED ORDER — DICLOFENAC SODIUM 1 % EX GEL: 4.0000 g | Freq: Four times a day (QID) | CUTANEOUS | 0 refills | Status: DC

## 2020-01-07 NOTE — Progress Notes (Signed)
Office Visit Note   Patient: Tina Horton           Date of Birth: 31-Oct-1965           MRN: [id number] Visit Date: 01/07/2020              Requested by: Carlena Hurl, PA-C [address] [place],  [place] [address] PCP: Carlena Hurl, PA-C   Assessment & Plan: Visit Diagnoses:  1. Acute pain of both knees   2. Mass of right lower leg   3. Osteochondroma of tibia, right   4. Degenerative disc disease, lumbar   5. Chondromalacia patellae, left knee   6. Chondromalacia patellae, right knee   7. Quadriceps tendonitis     Plan: Plan: Knee is suffering from osteoarthritis, only real proven treatments are Weight loss, avoid NSAIDs that may injure the kidneys and may be more likely to do so in patients that have diabetes and exercise. Well padded shoes help. Ice the knee that is suffering from osteoarthritis, only real proven treatments are  Well padded shoes help. Ice the knee 2-3 times a day 15-20 mins at a time.-3 times a day 15-20 mins at a time. Hot showers in the AM.  Injection with steroid may be of benefit. Hemp CBD capsules, amazon.com 5,000-7,000 mg per bottle, 60 capsules per bottle, take one capsule twice a day. Cane in the left hand to use with left leg weight bearing. Referral to therapy for treatment of bilateral patella chondromalacia. Follow-Up Instructions: No follow-ups on file.   Follow-Up Instructions: Return in about 5 weeks (around 02/11/2020).   Orders:  Orders Placed This Encounter  Procedures  . XR KNEE 3 VIEW RIGHT  . XR KNEE 3 VIEW LEFT  . XR Ankle Complete Right  . Ambulatory referral to Physical Therapy   Meds ordered this encounter  Medications  . diclofenac Sodium (VOLTAREN) 1 % GEL    Sig: Apply 4 g topically 4 (four) times daily.    Dispense:  350 g    Refill:  0      Procedures: No procedures performed   Clinical Data: No additional findings.   Subjective: Chief Complaint  Patient presents with  . Right  Knee - Pain  . Left Knee - Pain    55 year old female with history of right knee pain greater than left knee anterior pain and also into the soft tissue above the right knee. She has stiffness in the AM and pain with walking up and down hills and slopes. Also there is a lump over the anterior lateral right distal tibia that has been there for years. Her primary care PA has recommended we assess this lump.    Review of Systems   Objective: Vital Signs: BP (!) 204/89   Pulse 89   Ht 5\' 1"  (1.549 m)   Wt 157 lb 3.2 oz (71.3 kg)   LMP 09/13/2015   BMI 29.70 kg/m   Physical Exam  Ortho Exam  Specialty Comments:  No specialty comments available.  Imaging: XR KNEE 3 VIEW LEFT  Result Date: 01/07/2020 AP and lateral radiographs show medial and lateral joint line with minimal sign of joint line narrowing. Lateral radiographs demonstrate retropatella irregular joint line with mild cysts forming subchondral and superior spur greater than inferior. There is lateral placement of the patella on the distal femoral condyles.   XR KNEE 3 VIEW RIGHT  Result Date: 01/07/2020 AP and lateral radiographs show medial and  lateral joint line with minimal sign of joint line narrowing. Lateral radiographs demonstrate retropatella irregular joint line with mild cysts forming subchondral and superior spur greater than inferior. There is lateral placement of the patella on the distal femoral condyles.     PMFS History: Patient Active Problem List   Diagnosis Date Noted  . Chronic pain of both knees 01/04/2020  . 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 CKD (chronic kidney disease) stage 2, GFR 60-89 ml/min 11/29/2019  . 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 Vitamin D deficiency 03/13/2018  . Chronic kidney disease 01/20/2018  . Primary osteoarthritis of both knees 01/20/2018  . DDD (degenerative disc disease), lumbar 01/20/2018  . Localized swelling, mass or lump of neck 10/30/2017  . Microalbuminuria  08/26/2017  . Mass of neck 08/26/2017  . Screening for cancer 08/20/2017  . Left leg pain 08/20/2017  . Respiratory tract infection 08/20/2017  . Nausea 08/20/2017  . Renal cyst 01/15/2016  . Hydronephrosis of left kidney 01/15/2016  . Spinal stenosis 01/15/2016  . Anemia, iron deficiency 12/04/2015  . History of burning pain in leg 12/04/2015  . S/P laparoscopic assisted vaginal hysterectomy (LAVH) 09/13/2015  . Encounter for health maintenance examination in adult 08/21/2015  . Hyperlipidemia 08/21/2015  . Glaucoma 08/21/2015  . Heart murmur 08/21/2015  . Thyroid nodule 08/21/2015  . Vaccine refused by patient 08/21/2015  . Noncompliance 03/21/2015  . Essential hypertension 03/21/2015  . Uncontrolled diabetes mellitus with complication, with long-term current use of insulin ([facility]) 03/21/2015  . Lymphadenitis 03/21/2015  . SKIN LESION 10/19/2008  . SYSTOLIC MURMUR 50/27/7412  . GLAUCOMA 07/17/2006  . VISUAL DISTURBANCE NOS 07/17/2006  . REFLUX ESOPHAGITIS 07/17/2006   Past Medical History:  Diagnosis Date  . Anemia   . Arthritis    knees  . Bronchitis   . Diabetes mellitus without complication [facility])    age 17yo  . GERD (gastroesophageal reflux disease)   . Glaucoma    Dr. Einar Gip  . H/O mammogram 2005  . Headache   . Heart murmur   . Hyperlipidemia   . Hypertension   . Intermittent palpitations   . Legally blind   . Myopia   . Pneumonia   . Routine gynecological examination    last pap 2005  . Seasonal allergies   . Shortness of breath dyspnea   . Sinusitis   . Thyroid nodule     Family History  Problem Relation Age of Onset  . Diabetes Mother   . Hypertension Mother   . Stroke Mother   . Hypertension Father   . Chronic Renal Failure Father   . Hypertension Sister   . Hypertension Brother   . Stroke Maternal Grandmother   . Asthma Daughter   . Healthy Daughter   . Healthy Son   . Cancer Neg Hx   . Heart disease Neg Hx     Past Surgical History:   Procedure Laterality Date  . CYSTECTOMY     umbilicus  . INCISION AND DRAINAGE     abdominal superficial abscess  . LAPAROSCOPIC VAGINAL HYSTERECTOMY WITH SALPINGECTOMY Bilateral 09/13/2015   Procedure: LAPAROSCOPIC ASSISTED VAGINAL HYSTERECTOMY WITH SALPINGECTOMY, [facility] Colpoplasty;  Surgeon: Thurnell Lose, MD;  Location: [facility];  Service: Gynecology;  Laterality: Bilateral;  . LASIK     x2   Social History   Occupational History  . Not on file  Tobacco Use  . Smoking status: Never Smoker  . Smokeless tobacco: Never Used  Vaping Use  . Vaping Use: Never  used  Substance and Sexual Activity  . Alcohol use: No    Alcohol/week: 0.0 standard drinks  . Drug use: No  . Sexual activity: Not on file

## 2020-01-07 NOTE — Patient Instructions (Signed)
Plan: Knee is suffering from osteoarthritis, only real proven treatments are Weight loss, avoid NSAIDs that may injure the kidneys and may be more likely to do so in patients that have diabetes and exercise. Well padded shoes help. Ice the knee that is suffering from osteoarthritis, only real proven treatments are  Well padded shoes help. Ice the knee 2-3 times a day 15-20 mins at a time.-3 times a day 15-20 mins at a time. Hot showers in the AM.  Injection with steroid may be of benefit. Hemp CBD capsules, amazon.com 5,000-7,000 mg per bottle, 60 capsules per bottle, take one capsule twice a day. Cane in the left hand to use with left leg weight bearing. Referral to therapy for treatment of bilateral patella chondromalacia. Follow-Up Instructions: No follow-ups on file.

## 2020-01-17 DIAGNOSIS — Z794 Long term (current) use of insulin: Secondary | ICD-10-CM | POA: Diagnosis not present

## 2020-01-17 DIAGNOSIS — E119 Type 2 diabetes mellitus without complications: Secondary | ICD-10-CM | POA: Diagnosis not present

## 2020-01-17 DIAGNOSIS — H401134 Primary open-angle glaucoma, bilateral, indeterminate stage: Secondary | ICD-10-CM | POA: Diagnosis not present

## 2020-01-21 ENCOUNTER — Other Ambulatory Visit: Payer: Self-pay | Admitting: Medical

## 2020-01-26 ENCOUNTER — Ambulatory Visit: Payer: Medicare HMO | Admitting: Rehabilitative and Restorative Service Providers"

## 2020-01-30 ENCOUNTER — Other Ambulatory Visit: Payer: Self-pay | Admitting: Specialist

## 2020-01-31 DIAGNOSIS — I129 Hypertensive chronic kidney disease with stage 1 through stage 4 chronic kidney disease, or unspecified chronic kidney disease: Secondary | ICD-10-CM | POA: Diagnosis not present

## 2020-02-07 ENCOUNTER — Telehealth: Payer: Self-pay | Admitting: Medical

## 2020-02-07 ENCOUNTER — Ambulatory Visit: Payer: Medicare HMO | Admitting: Physical Therapy

## 2020-02-07 NOTE — Telephone Encounter (Signed)
If she starts to develop symptoms and I would recommend a virtual consult.  She is high risk for complications so we would need to discuss other treatment if she does start to get symptoms

## 2020-02-07 NOTE — Telephone Encounter (Signed)
Pt stated that her daughter tested positive. I advised pt that we do testing here. Pt declined a test at this time and said she will call back if anything changes.

## 2020-02-07 NOTE — Telephone Encounter (Signed)
Dr. Redmond School said she called after hours line.   Call and see if she needs virtual, covid test, etc?

## 2020-02-08 ENCOUNTER — Other Ambulatory Visit (INDEPENDENT_AMBULATORY_CARE_PROVIDER_SITE_OTHER): Payer: Medicare HMO

## 2020-02-08 ENCOUNTER — Telehealth (INDEPENDENT_AMBULATORY_CARE_PROVIDER_SITE_OTHER): Payer: Medicare HMO | Admitting: Medical

## 2020-02-08 ENCOUNTER — Other Ambulatory Visit: Payer: Self-pay

## 2020-02-08 VITALS — Ht 61.0 in | Wt 158.0 lb

## 2020-02-08 DIAGNOSIS — R52 Pain, unspecified: Secondary | ICD-10-CM

## 2020-02-08 DIAGNOSIS — R5383 Other fatigue: Secondary | ICD-10-CM

## 2020-02-08 DIAGNOSIS — R0981 Nasal congestion: Secondary | ICD-10-CM

## 2020-02-08 DIAGNOSIS — R059 Cough, unspecified: Secondary | ICD-10-CM

## 2020-02-08 DIAGNOSIS — R509 Fever, unspecified: Secondary | ICD-10-CM

## 2020-02-08 DIAGNOSIS — U071 COVID-19: Secondary | ICD-10-CM | POA: Diagnosis not present

## 2020-02-08 DIAGNOSIS — R05 Cough: Secondary | ICD-10-CM | POA: Diagnosis not present

## 2020-02-08 LAB — POCT INFLUENZA A/B
Influenza A, POC: NEGATIVE
Influenza B, POC: NEGATIVE

## 2020-02-08 LAB — POC COVID19 BINAXNOW: SARS Coronavirus 2 Ag: POSITIVE — AB

## 2020-02-08 MED ORDER — EMERGEN-C IMMUNE PLUS PO PACK: 1.0000 | PACK | Freq: Two times a day (BID) | ORAL | 0 refills | Status: DC

## 2020-02-08 MED ORDER — AFRIN NASAL SPRAY 0.05 % NA SOLN: 1.0000 | Freq: Two times a day (BID) | NASAL | 0 refills | Status: DC

## 2020-02-08 NOTE — Progress Notes (Signed)
Subjective:     Patient ID: Tina Horton, female   DOB: 1965/05/28, 54 y.o.   MRN: [id number]  This visit type was conducted due to national recommendations for restrictions regarding the COVID-19 Pandemic (e.g. social distancing) in an effort to limit this patient's exposure and mitigate transmission in our community.  Due to their co-morbid illnesses, this patient is at least at moderate risk for complications without adequate follow up.  This format is felt to be most appropriate for this patient at this time.    Documentation for virtual audio and video telecommunications through [place] encounter:  The patient was located at home. The provider was located in the office. The patient did consent to this visit and is aware of possible charges through their insurance for this visit.  The other persons participating in this telemedicine service were none. Time spent on call was 20 minutes and in review of previous records 20 minutes total.  This virtual service is not related to other E/M service within previous 7 days.   HPI Chief Complaint  Patient presents with  . Follow-up    +covid   Virtual consult for Covid infection.  Started feeling sick about 6 days ago.  She has had a rough week last week including body aches, fever, chills, felt like the flu, nasal congestion, some cough, some nausea.  Denies diarrhea, no vomiting, no shortness of breath or wheezing.  She relates she is drinking okay amount of fluids.  She does feel better so far this week than last week.  No other aggravating or relieving factors. No other complaint.  Past Medical History:  Diagnosis Date  . Anemia   . Arthritis    knees  . Bronchitis   . Diabetes mellitus without complication [facility])    age 53yo  . GERD (gastroesophageal reflux disease)   . Glaucoma    Dr. Einar Gip  . H/O mammogram 2005  . Headache   . Heart murmur   . Hyperlipidemia   . Hypertension   . Intermittent palpitations   .  Legally blind   . Myopia   . Pneumonia   . Routine gynecological examination    last pap 2005  . Seasonal allergies   . Shortness of breath dyspnea   . Sinusitis   . Thyroid nodule     Review of Systems As in subjective    Objective:   Physical Exam Due to coronavirus pandemic stay at home measures, patient visit was virtual and they were not examined in person.   Ht 5\' 1"  (1.549 m)   Wt 158 lb (71.7 kg)   LMP 09/13/2015   BMI 29.85 kg/m       Assessment:     Encounter Diagnoses  Name Primary?  . COVID-19 virus infection Yes  . Fatigue, unspecified type   . Body aches   . Nasal congestion        Plan:     We discussed her symptoms and concerns.  I offered referral for infusion clinic but she declines  We discussed the fact that she is high risk  She will continue supportive care.  She can use medicines below.  Can use Afrin short-term for 3 to 4 days.  She declines any other medicine support today  We discussed rest, hydration, medications.  Advise she cut her losartan in half and take 1/2 tablet for 3 days in the event she is somewhat dehydrated  Continue quarantine  Discussed follow-up if worse or not  improving over the next few days  [place] was seen today for follow-up.  Diagnoses and all orders for this visit:  COVID-19 virus infection  Fatigue, unspecified type  Body aches  Nasal congestion  Other orders -     Multiple Vitamins-Minerals (EMERGEN-C IMMUNE PLUS) PACK; Take 1 tablet by mouth 2 (two) times daily. -     oxymetazoline (AFRIN NASAL SPRAY) 0.05 % nasal spray; Place 1 spray into both nostrils 2 (two) times daily.  f/u prn

## 2020-02-13 DIAGNOSIS — R05 Cough: Secondary | ICD-10-CM | POA: Diagnosis not present

## 2020-02-13 DIAGNOSIS — R0602 Shortness of breath: Secondary | ICD-10-CM | POA: Diagnosis not present

## 2020-02-13 DIAGNOSIS — J40 Bronchitis, not specified as acute or chronic: Secondary | ICD-10-CM | POA: Diagnosis not present

## 2020-02-21 ENCOUNTER — Telehealth: Payer: Self-pay | Admitting: Medical

## 2020-02-21 NOTE — Telephone Encounter (Signed)
She does not necessarily need another test if she has completed her quarantine.  And not currently having a lot of symptoms.  So if she is beyond 10 or more days and no fever, no significant cough, no wheezing or shortness of breath, then it is okay for her to be out in public.  She should still continue to wear a mask though

## 2020-02-21 NOTE — Telephone Encounter (Signed)
Pt called and wants to know if she can come back for a repeat covid test states she still has some congestion cough, some sinus issues, and her taste has not completely come back, states it has been since 02/08/2020 since her + covid test come back,  Also wants to know if she can go out in public with a mask on  She can be reached at [phone]

## 2020-02-21 NOTE — Telephone Encounter (Signed)
Called and informed pt and she thank you!!

## 2020-02-22 ENCOUNTER — Other Ambulatory Visit: Payer: Self-pay | Admitting: Medical

## 2020-02-24 ENCOUNTER — Telehealth: Payer: Self-pay

## 2020-02-24 NOTE — Telephone Encounter (Signed)
Pt. Called stating she was here yesterday with her husband and forgot to schedule a f/u from covid for herself. I got her scheduled for tomorrow just checking to make sure its ok to schedule in person. She tested positive on 02/08/20 and she said the only symptoms she has left now are a slight cough and leg pain.

## 2020-02-24 NOTE — Telephone Encounter (Signed)
Leave in person

## 2020-02-25 ENCOUNTER — Encounter: Payer: Self-pay | Admitting: Medical

## 2020-02-25 ENCOUNTER — Other Ambulatory Visit: Payer: Self-pay

## 2020-02-25 ENCOUNTER — Other Ambulatory Visit: Payer: Self-pay | Admitting: Medical

## 2020-02-25 ENCOUNTER — Ambulatory Visit (INDEPENDENT_AMBULATORY_CARE_PROVIDER_SITE_OTHER): Payer: Medicare HMO | Admitting: Medical

## 2020-02-25 VITALS — BP 128/84 | HR 86 | Temp 98.6°F | Wt 153.6 lb

## 2020-02-25 DIAGNOSIS — J0110 Acute frontal sinusitis, unspecified: Secondary | ICD-10-CM

## 2020-02-25 DIAGNOSIS — U071 COVID-19: Secondary | ICD-10-CM | POA: Diagnosis not present

## 2020-02-25 DIAGNOSIS — J3489 Other specified disorders of nose and nasal sinuses: Secondary | ICD-10-CM

## 2020-02-25 MED ORDER — ONDANSETRON HCL 4 MG PO TABS: 4.0000 mg | ORAL_TABLET | Freq: Three times a day (TID) | ORAL | 0 refills | Status: DC | PRN

## 2020-02-25 MED ORDER — HYDROCODONE-HOMATROPINE 5-1.5 MG PO TABS
1.0000 | ORAL_TABLET | Freq: Four times a day (QID) | ORAL | 0 refills | Status: DC | PRN
Start: 2020-02-25 — End: 2020-02-25

## 2020-02-25 MED ORDER — AMOXICILLIN 875 MG PO TABS: 875.0000 mg | ORAL_TABLET | Freq: Two times a day (BID) | ORAL | 0 refills | Status: DC

## 2020-02-25 MED ORDER — HYDROCODONE-HOMATROPINE 5-1.5 MG PO TABS
1.0000 | ORAL_TABLET | Freq: Four times a day (QID) | ORAL | 0 refills | Status: DC | PRN
Start: 2020-02-25 — End: 2020-03-30

## 2020-02-25 NOTE — Progress Notes (Signed)
Established patient visit   Patient: Tina Horton   DOB: 05-21-1965   54 y.o. Female  MRN: [id number] Visit Date: 02/25/2020  Today's healthcare provider: Dorothea Ogle, PA-C  Care Team Ophthalmology:Dr. Lonie Horton Orthopedics: Dr. Basil Horton Nephrology:Dr.Adetoye Horton Obstetrics: Dr.Jill Earleen Horton  Chief Complaint  Patient presents with  . other    positive covid on 02/08/20 cough, nasal congestion, coughing up mucous   Subjective    HPI HPI    other     Additional comments: positive covid on 02/08/20 cough, nasal congestion, coughing up mucous       Last edited by Tina Horton, RMA on 02/25/2020 10:43 AM. (History)      Follow-up Patient presents today for folow up on covid infection.  Patient tested positive for COVID on 02/08/20.  She initially had a variety of symptoms, cough, nasal congestion,sinus pressure, but has gotten better.  She still has sinus pressure though and lots of mucous.   She vomited once today but hasn't been having nausea or vomiting.  Patient denies diarrhea, SOB. Does have taste back but smell has not came back completely.  [facility] urgent care doctor put her on a zpak and Mucinex for 7 days. Did not try the saline nasal flush.  She reports leg pain as well when having COVID and walk sometimes at the park.  No calf swelling.  No other aggravating or relieving factors. No other complaint.    Medications: Outpatient Medications Prior to Visit  Medication Sig  . Alcohol Swabs (B-D SINGLE USE SWABS REGULAR) PADS USE AT THE TIME OF CHECKING BLOOD SUGAR  . aspirin EC 81 MG tablet Take 1 tablet (81 mg total) by mouth daily.  . Blood Glucose Monitoring Suppl (PRODIGY AUTOCODE BLOOD GLUCOSE) w/Device KIT   . carvedilol (COREG) 3.125 MG tablet Take 3.125 mg by mouth 2 (two) times daily.  . Cholecalciferol 50 MCG (2000 UT) TABS Take by mouth.  . Continuous Blood Gluc Receiver (DEXCOM G6 RECEIVER) DEVI Check sugars 2-3 times  daily  . Continuous Blood Gluc Transmit (DEXCOM G6 TRANSMITTER) MISC Check sugars 2-3 times daily  . dorzolamide-timolol (COSOPT) 22.3-6.8 MG/ML ophthalmic solution Place 1 drop into both eyes 2 times daily.  [name] glucose blood (PRODIGY NO CODING BLOOD GLUC) test strip Use as instructed  . insulin aspart (NOVOLOG FLEXPEN) 100 UNIT/ML FlexPen INJECT 20 UNITS SUBCUTANEOUSLY THREE TIMES DAILY WITH MEALS (USE SLIDING SCALE PROVIDED BY MD)(PEN EXPIRES AT 28 DAYS)  . Insulin Pen Needle (NOVOFINE PLUS) 32G X 4 MM MISC Use 4 times a day with Lantus Insulin  . latanoprost (XALATAN) 0.005 % ophthalmic solution Place 1 drop into both eyes at bedtime.  [name] LEVEMIR FLEXTOUCH 100 UNIT/ML FlexPen INJECT 40 UNITS SUBCUTANEOUSLY AT BEDTIME  . losartan (COZAAR) 100 MG tablet Take 1 tablet (100 mg total) by mouth daily.  . Multiple Vitamins-Minerals (EMERGEN-C IMMUNE PLUS) PACK Take 1 tablet by mouth 2 (two) times daily.  . Prodigy Twist Top Lancets 28G MISC USE TO TEST BLOOD SUGAR ONCE OR TWICE DAILY  . atenolol (TENORMIN) 25 MG tablet Take 1 tablet (25 mg total) by mouth daily. (Patient not taking: Reported on 01/04/2020)  . diclofenac Sodium (VOLTAREN) 1 % GEL APPLY 4 GRAMS TOPICALLY 4 (FOUR) TIMES DAILY. (Patient not taking: Reported on 02/25/2020)  . glucose blood (PRODIGY NO CODING BLOOD GLUC) test strip USE TO TEST BLOOD SUGAR THREE TIMES DAILY (Patient not taking: Reported on 02/25/2020)  . oxymetazoline (AFRIN NASAL  SPRAY) 0.05 % nasal spray Place 1 spray into both nostrils 2 (two) times daily. (Patient not taking: Reported on 02/25/2020)  . rosuvastatin (CRESTOR) 20 MG tablet Take 1 tablet (20 mg total) by mouth daily. (Patient not taking: Reported on 01/04/2020)   No facility-administered medications prior to visit.    Review of Systems    Objective    BP 128/84   Pulse 86   Temp 98.6 F (37 C)   Wt 153 lb 9.6 oz (69.7 kg)   LMP 09/13/2015   BMI 29.02 kg/m    Physical Exam Constitutional:       Appearance: Normal appearance. She is normal weight.  HENT:     Nose: Nose normal.     Mouth/Throat:     Mouth: Mucous membranes are moist.     Pharynx: Oropharynx is clear.  Cardiovascular:     Rate and Rhythm: Normal rate and regular rhythm.     Pulses: Normal pulses.     Heart sounds: Normal heart sounds. No murmur heard.  No gallop.   Pulmonary:     Effort: Pulmonary effort is normal.     Breath sounds: Normal breath sounds. No wheezing, rhonchi or rales.  Skin:    General: Skin is warm and dry.  Neurological:     General: No focal deficit present.     Mental Status: She is alert and oriented to person, place, and time.  Psychiatric:        Mood and Affect: Mood normal.       Assessment & Plan   Encounter Diagnoses  Name Primary?  . COVID-19 virus infection Yes  . Sinus pressure   . Acute non-recurrent frontal sinusitis         Discussed symptoms.  She is much improved from initial covid infection.  She still has quite a bit of residual sinus pressure and mucous.  Begin medications below, advised nasal saline flush, hydrate well with water, and symptoms should continue to improve.     Lungs clear today, no recent cardiac symptoms.  F/u with diabetes specialist as planned.   Tina Horton was seen today for other.  Diagnoses and all orders for this visit:  COVID-19 virus infection  Sinus pressure  Acute non-recurrent frontal sinusitis  Other orders -     Discontinue: amoxicillin (AMOXIL) 875 MG tablet; Take 1 tablet (875 mg total) by mouth 2 (two) times daily. -     Discontinue: ondansetron (ZOFRAN) 4 MG tablet; Take 1 tablet (4 mg total) by mouth every 8 (eight) hours as needed for nausea or vomiting. -     Discontinue: HYDROcodone-Homatropine 5-1.5 MG TABS; Take 1 tablet by mouth every 6 (six) hours as needed. -     Discontinue: HYDROcodone-Homatropine 5-1.5 MG TABS; Take 1 tablet by mouth every 6 (six) hours as needed. -     Discontinue: ondansetron (ZOFRAN) 4 MG  tablet; Take 1 tablet (4 mg total) by mouth every 8 (eight) hours as needed for nausea or vomiting. -     Discontinue: amoxicillin (AMOXIL) 875 MG tablet; Take 1 tablet (875 mg total) by mouth 2 (two) times daily.

## 2020-02-25 NOTE — Telephone Encounter (Signed)
Please send patient meds to [facility].

## 2020-02-28 ENCOUNTER — Ambulatory Visit: Payer: Medicare HMO | Admitting: Physical Therapy

## 2020-02-28 ENCOUNTER — Ambulatory Visit: Payer: BLUE CROSS/BLUE SHIELD | Admitting: Internal Medicine

## 2020-02-28 ENCOUNTER — Telehealth: Payer: Self-pay | Admitting: Family Medicine

## 2020-02-28 NOTE — Telephone Encounter (Signed)
Dt  

## 2020-03-12 ENCOUNTER — Other Ambulatory Visit: Payer: Self-pay | Admitting: Medical

## 2020-03-13 ENCOUNTER — Other Ambulatory Visit: Payer: Self-pay | Admitting: Medical

## 2020-03-13 ENCOUNTER — Encounter: Payer: Medicare HMO | Admitting: Medical

## 2020-03-18 ENCOUNTER — Other Ambulatory Visit: Payer: Self-pay | Admitting: Medical

## 2020-03-21 ENCOUNTER — Other Ambulatory Visit: Payer: Self-pay | Admitting: Medical

## 2020-03-29 ENCOUNTER — Telehealth: Payer: Self-pay | Admitting: Specialist

## 2020-03-29 NOTE — Telephone Encounter (Signed)
Paitent needs follow up appt with either Dr. Louanne Skye or Jeneen Rinks

## 2020-03-29 NOTE — Telephone Encounter (Signed)
Patient called asked what can she do about the pain in her back and right side joint. Patient said  It is hard for her to walk.  Patient asked if she need to wear her back brace? The number to contact patient is [phone]

## 2020-03-30 ENCOUNTER — Encounter: Payer: Self-pay | Admitting: Family Medicine

## 2020-03-30 ENCOUNTER — Telehealth (INDEPENDENT_AMBULATORY_CARE_PROVIDER_SITE_OTHER): Payer: Medicare HMO | Admitting: Family Medicine

## 2020-03-30 ENCOUNTER — Other Ambulatory Visit: Payer: Self-pay

## 2020-03-30 VITALS — BP 161/89 | HR 89 | Wt 145.0 lb

## 2020-03-30 DIAGNOSIS — R42 Dizziness and giddiness: Secondary | ICD-10-CM | POA: Diagnosis not present

## 2020-03-30 DIAGNOSIS — R0981 Nasal congestion: Secondary | ICD-10-CM | POA: Diagnosis not present

## 2020-03-30 DIAGNOSIS — J3489 Other specified disorders of nose and nasal sinuses: Secondary | ICD-10-CM | POA: Diagnosis not present

## 2020-03-30 DIAGNOSIS — Z8616 Personal history of COVID-19: Secondary | ICD-10-CM | POA: Diagnosis not present

## 2020-03-30 DIAGNOSIS — R0982 Postnasal drip: Secondary | ICD-10-CM | POA: Diagnosis not present

## 2020-03-30 NOTE — Progress Notes (Signed)
   Subjective:  Documentation for virtual telephone encounter.  The patient was located at home. The provider was located in the office. The patient did consent to this visit and is aware of possible charges through their insurance for this visit.  The other persons participating in this telemedicine service were none. Time spent on call was 24 minutes and in review of previous records 28 minutes total.  This virtual service is not related to other E/M service within previous 7 days.  99441 (5-31min) 99442 (11-56min) 99443 (21-55min)   Patient ID: Tina Horton, female    DOB: [birth year], 54 y.o.   MRN: [id number]  HPI Chief Complaint  Patient presents with  . sinus infection    yesterday stood up and got dizzness, mild headache and some nasal congestion. pressure in face and nose. had covid back in september   S states she had an episode of dizziness yesterday when she stood up. She checked her BP, pulse and blood sugar and they were fine. No other episodes.  States she had poor balance and dizziness the whole time she had Covid.  Positive for Covid on 02/08/2020   Complains of severe nasal congestion, sinus pressure across her face, post nasal drainage causing her to gag and cough. Bilateral ear pain as well. She thinks she has fluid in her ears.  Underlying chronic allergies and sinus issues per patient.   States she has Benadryl at home as well as Mucinex but has not been taking it.  States she thinks she has amoxicillin at home that was prescribed by her PCP for possible sinus infection but she did not take it and is curious as to whether she could just take this or not.  Denies fever, chills, chest pain, palpitations, shortness of breath, abdominal pain, N/V/D, urinary symptoms, LE edema.   Reviewed allergies, medications, past medical, surgical, family, and social history.    Review of Systems Pertinent positives and negatives in the history of present  illness.     Objective:   Physical Exam BP (!) 161/89   Pulse 89   Wt 145 lb (65.8 kg)   LMP 09/13/2015   BMI 27.40 kg/m   Alert, oriented and in no distress.  Speaking in complete sentences without difficulty.      Assessment & Plan:  Sinus pain  Nasal congestion  Post-nasal drainage  Dizziness on standing  History of COVID-19  I suspect she has acute sinusitis.  Recommend amoxicillin if she is not turning the corner in the next 2 to 3 days.  She has amoxicillin at home already.  Discussed symptomatic treatment including drinking plenty of water, Mucinex and continuing with her regular allergy treatment. Discussed that her dizziness upon standing may be related to not being well hydrated so I encouraged hydration.  She will keep an eye on this and let me know if she is not back to baseline after completing the antibiotic or if she is worsening Discussed that her blood pressure is elevated and that she should follow-up with her PCP.  Also discussed keeping a good eye on her blood sugars at home and following up with PCP.

## 2020-03-31 ENCOUNTER — Other Ambulatory Visit: Payer: Self-pay | Admitting: Medical

## 2020-04-12 IMAGING — US US RENAL
1 series · 14 of 25 positions shown · non-contrast
Comparison: 01/17/2016

CLINICAL DATA: Hypertension and renal disease

EXAM:
RENAL / URINARY TRACT ULTRASOUND COMPLETE

[Series 1: us renal · 0.25mm/px · 14 of 68 slices shown]
[im 1/68]
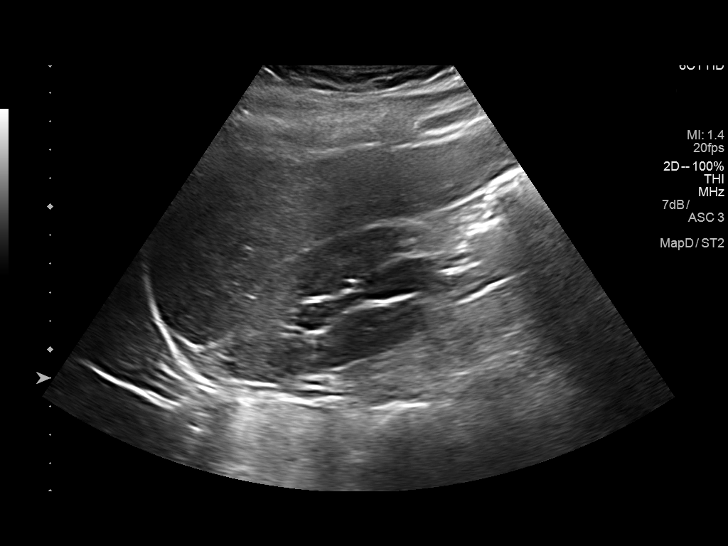
[im 6/68]
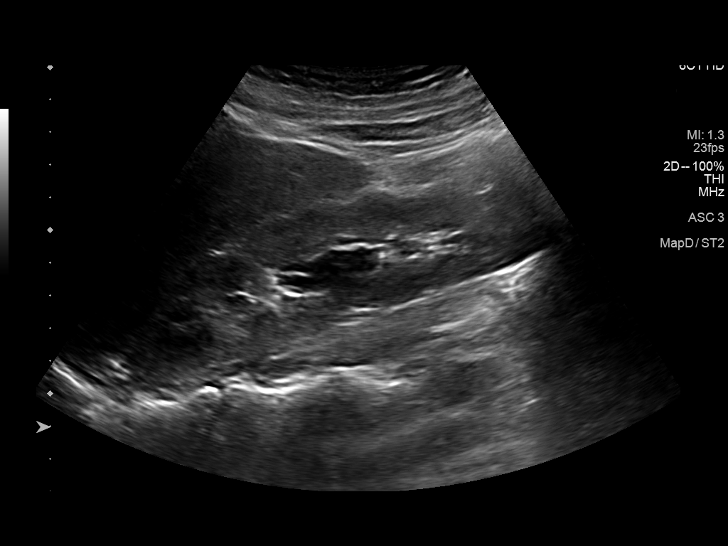
[im 12/68]
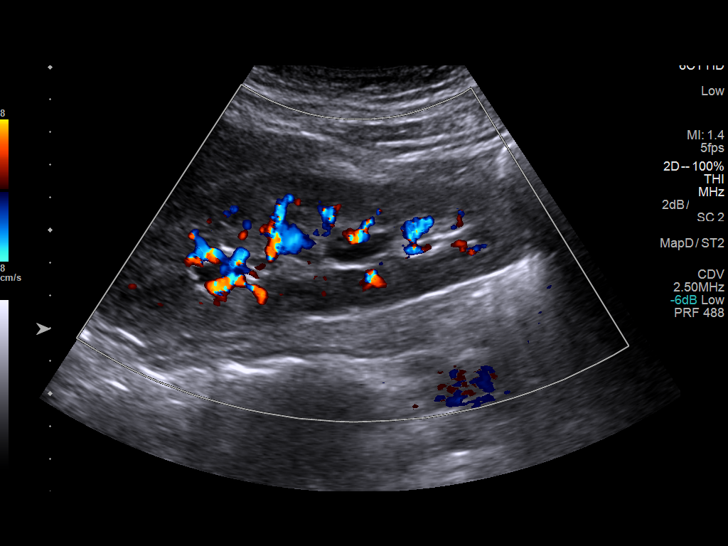
[im 17/68]
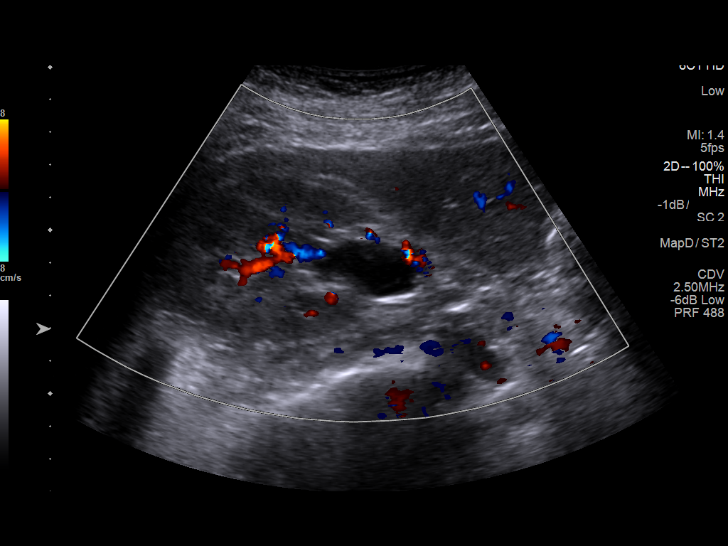
[im 23/68]
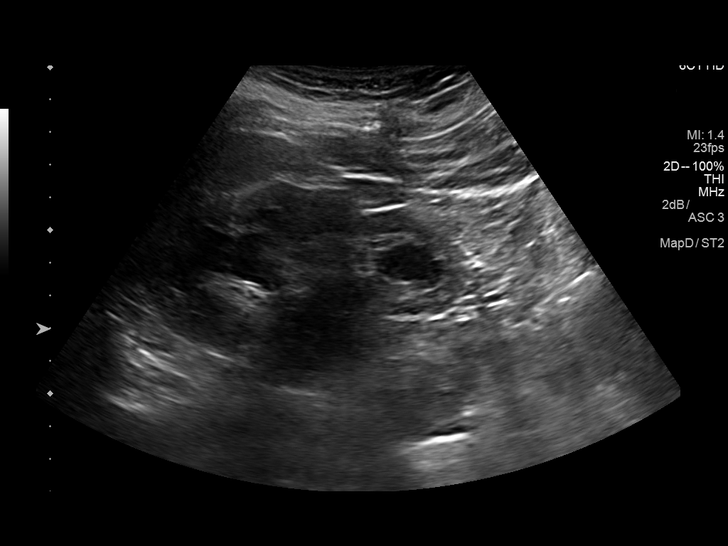
[im 26/68]
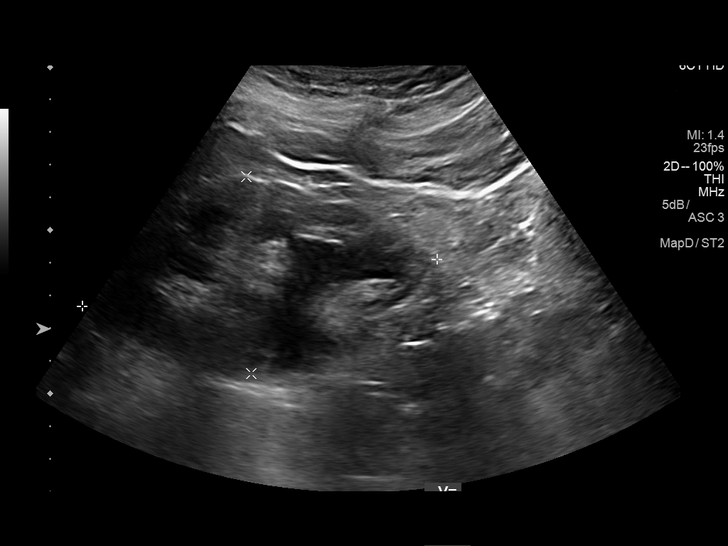
[im 31/68]
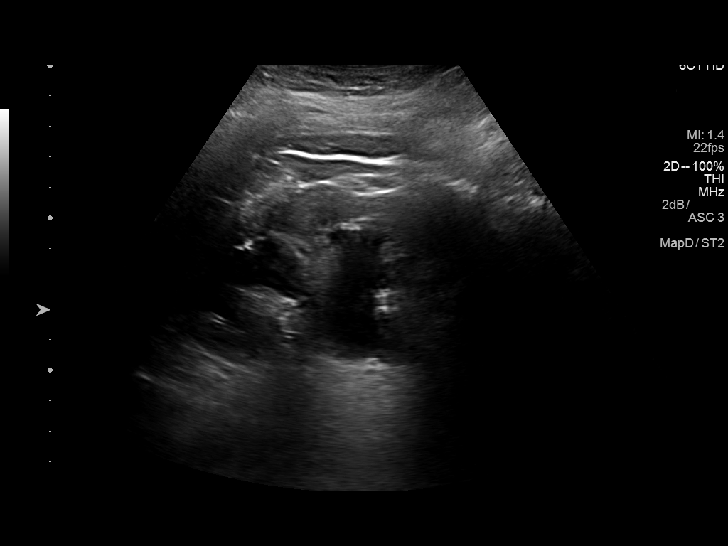
[im 37/68]
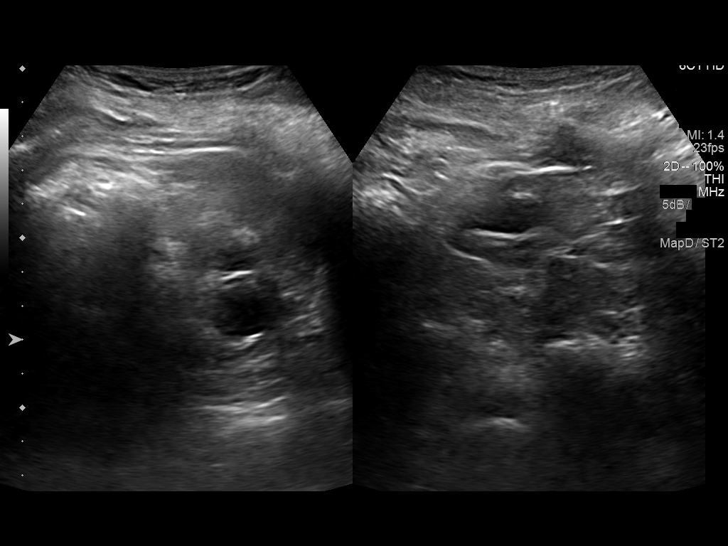
[im 42/68]
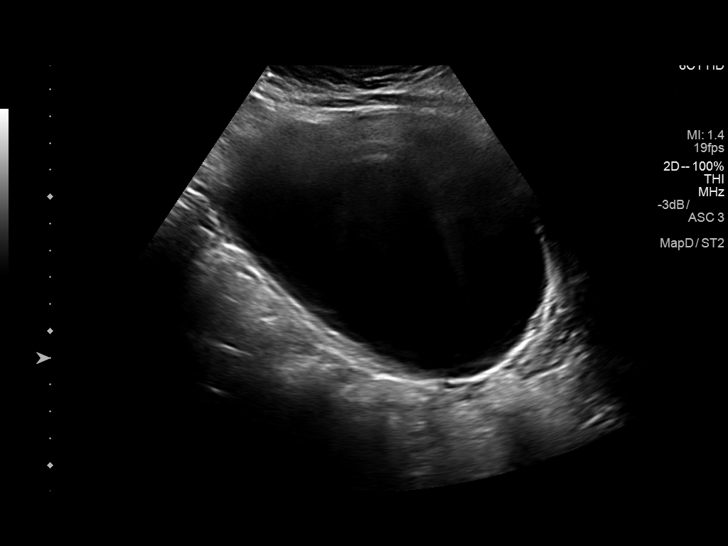
[im 45/68]
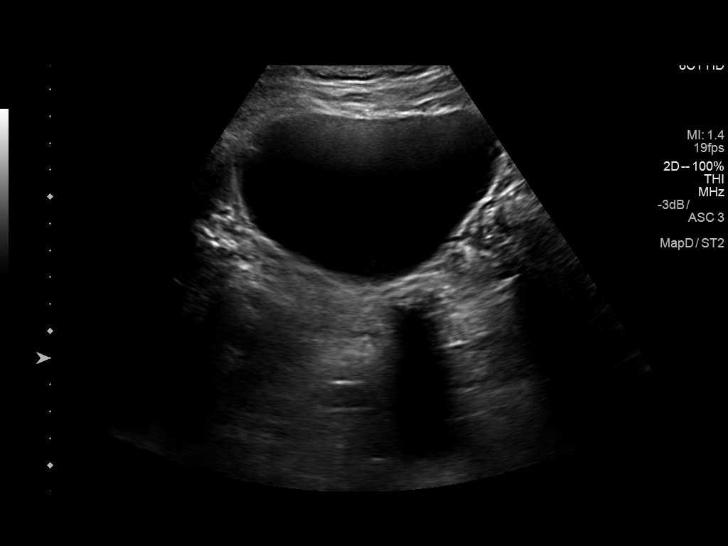
[im 51/68]
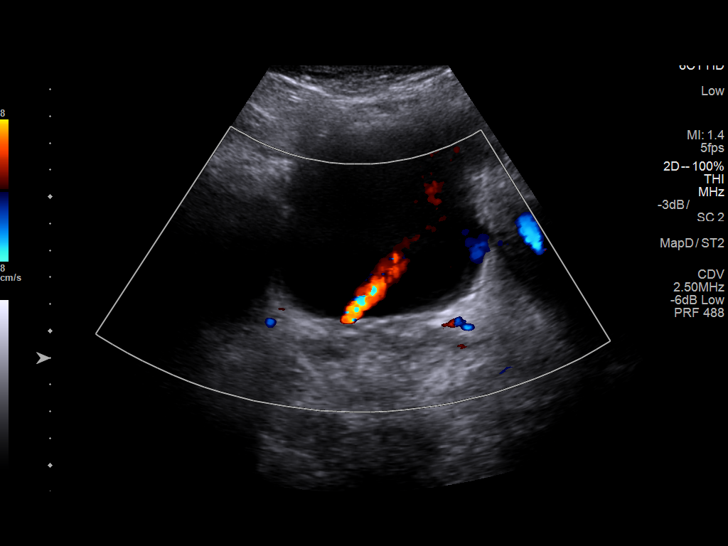
[im 56/68]
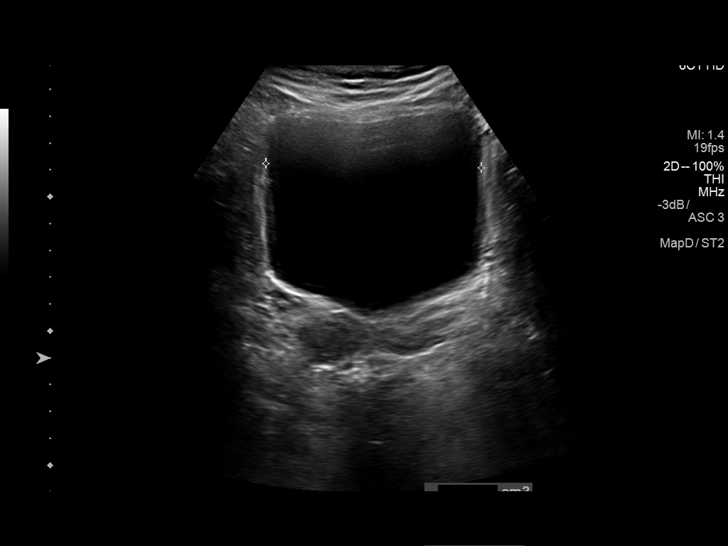
[im 62/68]
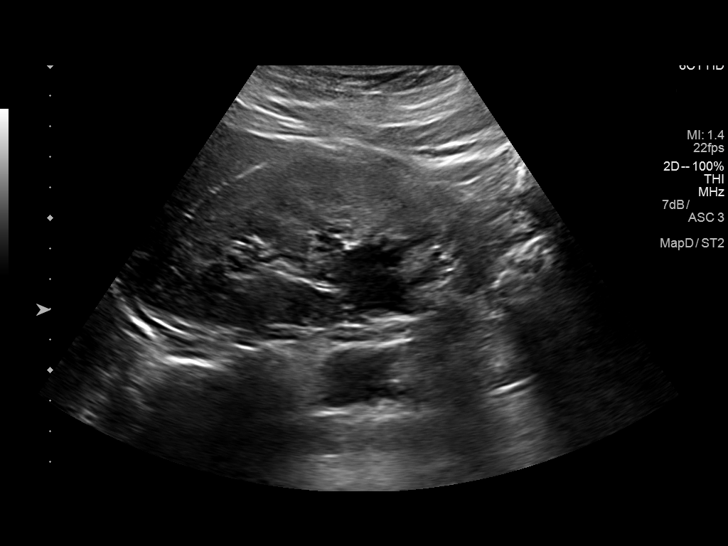
[im 68/68]
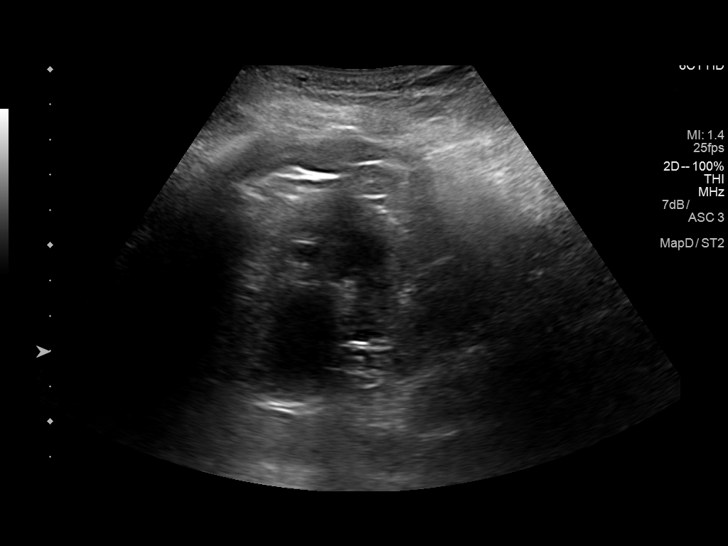

[14 of 25 positions shown; findings below may reference images not displayed]

FINDINGS: Right Kidney:

Renal measurements: 14 x 4 x 6 cm = volume: 207 mL. Pelviectasis and
extrarenal pelvis. No change from 2521.

Left Kidney:

Renal measurements: 11 x 6 x 5.6 = volume: 193 mL. This volume is
overestimated as there is definite cortical thinning to 8 mm.
Moderate to advanced hydronephrosis. No evidence of mass.

Bladder:

Pre and postvoid bladder volume is 618 and 234 cc. A right jet was
seen but the left jet was never visualized.
IMPRESSION: 1. Chronic left hydronephrosis with cortical thinning.
2. Right pelvicaliectasis that is unchanged from 2521.
3. Incomplete bladder emptying, postvoid residual was 234 cc.

## 2020-04-19 ENCOUNTER — Other Ambulatory Visit: Payer: Self-pay | Admitting: Medical

## 2020-04-27 ENCOUNTER — Ambulatory Visit: Payer: Medicare HMO | Admitting: Internal Medicine

## 2020-05-02 ENCOUNTER — Encounter: Payer: Medicare HMO | Admitting: Medical

## 2020-05-04 ENCOUNTER — Telehealth: Payer: Self-pay | Admitting: Specialist

## 2020-05-04 NOTE — Telephone Encounter (Signed)
Pt called having lots of pain walking and everything on the right leg is getting worse. Please call her at [phone].

## 2020-05-04 NOTE — Telephone Encounter (Signed)
Shed her an appt on Monday 05/08/20

## 2020-05-08 ENCOUNTER — Ambulatory Visit: Payer: Medicare HMO | Admitting: Specialist

## 2020-05-17 ENCOUNTER — Ambulatory Visit: Payer: Medicare HMO | Admitting: Family Medicine

## 2020-05-17 ENCOUNTER — Other Ambulatory Visit: Payer: Self-pay

## 2020-05-17 ENCOUNTER — Other Ambulatory Visit: Payer: Self-pay | Admitting: Medical

## 2020-05-18 ENCOUNTER — Encounter (HOSPITAL_COMMUNITY): Payer: Self-pay

## 2020-05-18 ENCOUNTER — Other Ambulatory Visit: Payer: Self-pay

## 2020-05-18 DIAGNOSIS — Z8616 Personal history of COVID-19: Secondary | ICD-10-CM | POA: Diagnosis not present

## 2020-05-18 DIAGNOSIS — R519 Headache, unspecified: Secondary | ICD-10-CM | POA: Diagnosis not present

## 2020-05-18 DIAGNOSIS — I10 Essential (primary) hypertension: Secondary | ICD-10-CM | POA: Diagnosis not present

## 2020-05-18 DIAGNOSIS — N182 Chronic kidney disease, stage 2 (mild): Secondary | ICD-10-CM | POA: Insufficient documentation

## 2020-05-18 DIAGNOSIS — Z7982 Long term (current) use of aspirin: Secondary | ICD-10-CM | POA: Insufficient documentation

## 2020-05-18 DIAGNOSIS — I129 Hypertensive chronic kidney disease with stage 1 through stage 4 chronic kidney disease, or unspecified chronic kidney disease: Secondary | ICD-10-CM | POA: Insufficient documentation

## 2020-05-18 DIAGNOSIS — E1122 Type 2 diabetes mellitus with diabetic chronic kidney disease: Secondary | ICD-10-CM | POA: Diagnosis not present

## 2020-05-18 DIAGNOSIS — Z79899 Other long term (current) drug therapy: Secondary | ICD-10-CM | POA: Diagnosis not present

## 2020-05-18 DIAGNOSIS — R42 Dizziness and giddiness: Secondary | ICD-10-CM | POA: Diagnosis not present

## 2020-05-18 DIAGNOSIS — I160 Hypertensive urgency: Secondary | ICD-10-CM | POA: Diagnosis not present

## 2020-05-18 DIAGNOSIS — Z794 Long term (current) use of insulin: Secondary | ICD-10-CM | POA: Diagnosis not present

## 2020-05-18 NOTE — ED Triage Notes (Signed)
Patient states she went to her PCP yesterday and would not be able to see her until next week. Patient concerned about hypertension. Patienat c/o slight dizziness and a slight headache.

## 2020-05-19 ENCOUNTER — Emergency Department (HOSPITAL_COMMUNITY): Payer: Medicare HMO

## 2020-05-19 ENCOUNTER — Emergency Department (HOSPITAL_COMMUNITY)
Admission: EM | Admit: 2020-05-19 | Discharge: 2020-05-19 | Disposition: A | Discharge status: Home / Self Care | Payer: Medicare HMO | Attending: Emergency Medicine | Admitting: Emergency Medicine

## 2020-05-19 DIAGNOSIS — R519 Headache, unspecified: Secondary | ICD-10-CM | POA: Diagnosis not present

## 2020-05-19 DIAGNOSIS — I10 Essential (primary) hypertension: Secondary | ICD-10-CM

## 2020-05-19 DIAGNOSIS — R42 Dizziness and giddiness: Secondary | ICD-10-CM | POA: Diagnosis not present

## 2020-05-19 LAB — CBC
HCT: 42.5 % (ref 36.0–46.0)
Hemoglobin: 14 g/dL (ref 12.0–15.0)
MCH: 28.9 pg (ref 26.0–34.0)
MCHC: 32.9 g/dL (ref 30.0–36.0)
MCV: 87.8 fL (ref 80.0–100.0)
Platelets: 300 10*3/uL (ref 150–400)
RBC: 4.84 MIL/uL (ref 3.87–5.11)
RDW: 12.7 % (ref 11.5–15.5)
WBC: 6.4 10*3/uL (ref 4.0–10.5)
nRBC: 0 % (ref 0.0–0.2)

## 2020-05-19 LAB — BASIC METABOLIC PANEL
Anion gap: 11 (ref 5–15)
BUN: 15 mg/dL (ref 6–20)
CO2: 27 mmol/L (ref 22–32)
Calcium: 9.7 mg/dL (ref 8.9–10.3)
Chloride: 100 mmol/L (ref 98–111)
Creatinine, Ser: 0.86 mg/dL (ref 0.44–1.00)
GFR, Estimated: >60 mL/min (ref >60)
Glucose, Bld: 243 mg/dL — ABNORMAL HIGH (ref 70–99)
Potassium: 3.2 mmol/L — ABNORMAL LOW (ref 3.5–5.1)
Sodium: 138 mmol/L (ref 135–145)

## 2020-05-19 LAB — CBG MONITORING, ED: Glucose-Capillary: 225 mg/dL — ABNORMAL HIGH (ref 70–99)

## 2020-05-19 MED ORDER — POTASSIUM CHLORIDE 20 MEQ PO PACK
40.0000 meq | PACK | Freq: Every day | ORAL | Status: DC
  Administered 2020-05-19: 40 meq via ORAL
  Filled 2020-05-19: qty 2

## 2020-05-19 MED ORDER — CARVEDILOL 6.25 MG PO TABS: 6.2500 mg | ORAL_TABLET | Freq: Two times a day (BID) | ORAL | 0 refills | Status: DC

## 2020-05-19 MED ORDER — LOSARTAN POTASSIUM 25 MG PO TABS
100.0000 mg | ORAL_TABLET | ORAL | Status: AC
  Administered 2020-05-19: 100 mg via ORAL
  Filled 2020-05-19: qty 4

## 2020-05-19 MED ORDER — CARVEDILOL 3.125 MG PO TABS
6.2500 mg | ORAL_TABLET | Freq: Two times a day (BID) | ORAL | Status: DC
  Administered 2020-05-19: 6.25 mg via ORAL
  Filled 2020-05-19: qty 2

## 2020-05-19 MED ORDER — CLONIDINE HCL 0.1 MG PO TABS
0.1000 mg | ORAL_TABLET | Freq: Once | ORAL | Status: AC
  Administered 2020-05-19: 0.1 mg via ORAL
  Filled 2020-05-19: qty 1

## 2020-05-19 NOTE — Discharge Instructions (Addendum)
Please pick up your new carvedilol prescription and take increased dose starting this afternoon Return to the emergency department if you are having any new symptoms such as chest pain or shortness of breath Follow-up with Dr. Berdine Addison as soon as possible.

## 2020-05-19 NOTE — ED Notes (Signed)
Pt requesting to take home insulin. MD aware.

## 2020-05-19 NOTE — ED Notes (Signed)
Pt verbalizes understanding of DC. Pt legally blind and is unable to sign

## 2020-05-19 NOTE — ED Provider Notes (Signed)
[facility] DEPT Provider Note   CSN: [id number] Arrival date & time: 05/18/20  1807     History Chief Complaint  Patient presents with  . Hypertension    Tina Horton is a 54 y.o. female.  HPI  54 yo female ho htn, reports high blood pressure with standard checks over the past week.  Seen at urgent care yesterday and bp 230 and told to come to ED.  She did not go to primary care because she was exposed to covid December 19.  She is asymptomatic. She had a positive test with covid symptoms September 19.  She has completely recovered.  She did not take carvedilol last night BP always elevated to sbp 160   pmd Dr. Dorothea Ogle- [facility]- [facility]    Past Medical History:  Diagnosis Date  . Anemia   . Arthritis    knees  . Bronchitis   . Diabetes mellitus without complication [facility])    age 75yo  . GERD (gastroesophageal reflux disease)   . Glaucoma    Dr. Einar Gip  . H/O mammogram 2005  . Headache   . Heart murmur   . Hyperlipidemia   . Hypertension   . Intermittent palpitations   . Legally blind   . Myopia   . Pneumonia   . Routine gynecological examination    last pap 2005  . Seasonal allergies   . Shortness of breath dyspnea   . Sinusitis   . Thyroid nodule     Patient Active Problem List   Diagnosis Date Noted  . COVID-19 virus infection 02/25/2020  . Sinus pressure 02/25/2020  . Acute non-recurrent frontal sinusitis 02/25/2020  . Chronic pain of both knees 01/04/2020  . 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 CKD (chronic kidney disease) stage 2, GFR 60-89 ml/min 11/29/2019  . 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 Vitamin D deficiency 03/13/2018  . Chronic kidney disease 01/20/2018  . Primary osteoarthritis of both knees 01/20/2018  . DDD (degenerative disc disease), lumbar 01/20/2018  . Localized swelling, mass or lump of neck 10/30/2017  . Microalbuminuria 08/26/2017  . Mass of neck 08/26/2017  .  Screening for cancer 08/20/2017  . Left leg pain 08/20/2017  . Respiratory tract infection 08/20/2017  . Nausea 08/20/2017  . Renal cyst 01/15/2016  . Hydronephrosis of left kidney 01/15/2016  . Spinal stenosis 01/15/2016  . Anemia, iron deficiency 12/04/2015  . History of burning pain in leg 12/04/2015  . S/P laparoscopic assisted vaginal hysterectomy (LAVH) 09/13/2015  . Encounter for health maintenance examination in adult 08/21/2015  . Hyperlipidemia 08/21/2015  . Glaucoma 08/21/2015  . Heart murmur 08/21/2015  . Thyroid nodule 08/21/2015  . Vaccine refused by patient 08/21/2015  . Noncompliance 03/21/2015  . Essential hypertension 03/21/2015  . Uncontrolled diabetes mellitus with complication, with long-term current use of insulin ([place]) 03/21/2015  . Lymphadenitis 03/21/2015  . SKIN LESION 10/19/2008  . SYSTOLIC MURMUR 85/63/1497  . GLAUCOMA 07/17/2006  . VISUAL DISTURBANCE NOS 07/17/2006  . REFLUX ESOPHAGITIS 07/17/2006    Past Surgical History:  Procedure Laterality Date  . CYSTECTOMY     umbilicus  . INCISION AND DRAINAGE     abdominal superficial abscess  . LAPAROSCOPIC VAGINAL HYSTERECTOMY WITH SALPINGECTOMY Bilateral 09/13/2015   Procedure: LAPAROSCOPIC ASSISTED VAGINAL HYSTERECTOMY WITH SALPINGECTOMY, McCalls Colpoplasty;  Surgeon: Thurnell Lose, MD;  Location: [facility];  Service: Gynecology;  Laterality: Bilateral;  . LASIK     x2     OB History  No obstetric history on file.     Family History  Problem Relation Age of Onset  . Diabetes Mother   . Hypertension Mother   . Stroke Mother   . Hypertension Father   . Chronic Renal Failure Father   . Hypertension Sister   . Hypertension Brother   . Stroke Maternal Grandmother   . Asthma Daughter   . Healthy Daughter   . Healthy Son   . Cancer Neg Hx   . Heart disease Neg Hx     Social History   Tobacco Use  . Smoking status: Never Smoker  . Smokeless tobacco: Never Used  Vaping Use  . Vaping  Use: Never used  Substance Use Topics  . Alcohol use: No    Alcohol/week: 0.0 standard drinks  . Drug use: No    Home Medications Prior to Admission medications   Medication Sig Start Date End Date Taking? Authorizing Provider  Alcohol Swabs (B-D SINGLE USE SWABS REGULAR) PADS USE WHEN TESTING BLOOD SUGAR 03/20/20   Tysinger, Camelia Eng, PA-C  aspirin EC 81 MG tablet Take 1 tablet (81 mg total) by mouth daily. 11/30/19   Tysinger, Camelia Eng, PA-C  Blood Glucose Monitoring Suppl (PRODIGY AUTOCODE BLOOD GLUCOSE) w/Device KIT  12/07/19   [provider]  carvedilol (COREG) 3.125 MG tablet Take 3.125 mg by mouth 2 (two) times daily. 01/03/20   [provider]  Cholecalciferol 50 MCG (2000 UT) TABS Take by mouth. 01/21/19   [provider]  dorzolamide-timolol (COSOPT) 22.3-6.8 MG/ML ophthalmic solution Place 1 drop into both eyes 2 times daily. 12/23/19 12/22/20  [provider]  glucose blood (PRODIGY NO CODING BLOOD GLUC) test strip Use as instructed 11/05/17   Tysinger, Camelia Eng, PA-C  glucose blood (PRODIGY NO CODING BLOOD GLUC) test strip TEST BLOOD SUGAR THREE TIMES DAILY 03/13/20   Tysinger, Camelia Eng, PA-C  Insulin Pen Needle (NOVOFINE PLUS) 32G X 4 MM MISC Use 4 times a day with Lantus Insulin 12/24/19   Tysinger, Camelia Eng, PA-C  latanoprost (XALATAN) 0.005 % ophthalmic solution Place 1 drop into both eyes at bedtime.    [provider]  LEVEMIR FLEXTOUCH 100 UNIT/ML FlexPen INJECT 40 UNITS SUBCUTANEOUSLY AT BEDTIME 05/17/20   Tysinger, Camelia Eng, PA-C  losartan (COZAAR) 100 MG tablet Take 1 tablet (100 mg total) by mouth daily. 12/07/19   Tysinger, Camelia Eng, PA-C  Multiple Vitamins-Minerals (EMERGEN-C IMMUNE PLUS) PACK Take 1 tablet by mouth 2 (two) times daily. 02/08/20   Tysinger, Camelia Eng, PA-C  NOVOLOG FLEXPEN 100 UNIT/ML FlexPen INJECT 20 UNITS SUBCUTANEOUSLY THREE TIMES DAILY WITH MEALS (USE SLIDING SCALE PROVIDED BY MD)(PEN EXPIRES AT 28 DAYS) 03/31/20   Tysinger,  Camelia Eng, PA-C  ondansetron (ZOFRAN) 4 MG tablet Take 1 tablet (4 mg total) by mouth every 8 (eight) hours as needed for nausea or vomiting. Patient not taking: Reported on 03/30/2020 02/25/20   Carlena Hurl, PA-C  Prodigy Twist Top Lancets 28G MISC USE TO TEST BLOOD SUGAR THREE TIMES DAILY 03/13/20   Tysinger, Camelia Eng, PA-C  rosuvastatin (CRESTOR) 20 MG tablet Take 1 tablet (20 mg total) by mouth daily. 11/30/19 11/29/20  Tysinger, Camelia Eng, PA-C    Allergies    Amlodipine, Lisinopril, Metformin and related, and Tanzeum [albiglutide]  Review of Systems   Review of Systems  All other systems reviewed and are negative.   Physical Exam Updated Vital Signs BP (!) 188/92 (BP Location: Right Arm)   Pulse 95  Temp 98.7 F (37.1 C) (Oral)   Resp 16   Ht 1.549 m ($Remove'5\' 1"'GyVRTnL$ )   Wt 69.9 kg   LMP 09/13/2015   SpO2 100%   BMI 29.13 kg/m   Physical Exam Vitals and nursing note reviewed.  Constitutional:      Appearance: Normal appearance.  HENT:     Head: Normocephalic.     Right Ear: External ear normal.     Left Ear: External ear normal.     Nose: Nose normal.     Mouth/Throat:     Mouth: Mucous membranes are moist.     Pharynx: Oropharynx is clear.  Eyes:     Comments: No central vision left eye Right eye impaired   Cardiovascular:     Rate and Rhythm: Normal rate and regular rhythm.  Pulmonary:     Effort: Pulmonary effort is normal.     Breath sounds: Normal breath sounds.  Abdominal:     General: Abdomen is flat.     Palpations: Abdomen is soft.  Musculoskeletal:        General: Normal range of motion.     Cervical back: Normal range of motion.  Skin:    General: Skin is warm.     Capillary Refill: Capillary refill takes less than 2 seconds.  Neurological:     General: No focal deficit present.     Mental Status: She is alert.  Psychiatric:        Mood and Affect: Mood normal.        Behavior: Behavior normal.     ED Results / Procedures / Treatments    Labs (all labs ordered are listed, but only abnormal results are displayed) Labs Reviewed - No data to display  EKG EKG Interpretation  Date/Time:  Friday May 19 2020 09:36:33 EST Ventricular Rate:  94 PR Interval:    QRS Duration: 84 QT Interval:  372 QTC Calculation: 466 R Axis:   15 Text Interpretation: Sinus rhythm Consider anterior infarct Borderline repolarization abnormality No significant change since April 25, 2018 Confirmed by Pattricia Boss [phone]) on 05/19/2020 9:56:11 AM   Radiology DG Chest Port 1 View  Result Date: 05/19/2020 CLINICAL DATA:  54 y.o female states she went to her PCP yesterday and would not be able to see her until next week. Patient concerned about hypertension. Patienat c/o slight dizziness and a slight headache EXAM: PORTABLE CHEST 1 VIEW COMPARISON:  04/25/2018 FINDINGS: The heart size and mediastinal contours are within normal limits. Both lungs are clear. No pleural effusion or pneumothorax. The visualized skeletal structures are unremarkable. IMPRESSION: No active disease. Electronically Signed   By: Lajean Manes M.D.   On: 05/19/2020 08:21    Procedures Procedures (including critical care time)  Medications Ordered in ED Medications - No data to display  ED Course  I have reviewed the triage vital signs and the nursing notes.  Pertinent labs & imaging results that were available during my care of the patient were reviewed by me and considered in my medical decision making (see chart for details).  Clinical Course as of 05/19/20 0957  Fri May 19, 2020  0916 Labs reviewed, normal creatinine noted Mild hypokalemia and hyperglycemia without DKA noted [DR]    Clinical Course User Index [DR] Pattricia Boss, MD   MDM Rules/Calculators/A&P                          Patient with known poorly controlled hypertension  with sbp usually at 160 per patient.  Past several days increased sbp to 200s but asymptomatic.  Patient has now missed  her am doses of meds SBP here 1822-230, [id number], [id number], 1930-191 Patient given am doses of meds and clonidine 0.1 given Renal function appears stable. No chest pain and ekg unchanged cxr without cardiomegaly or s/s with heart failure Plan d/c if sbp remains less thant 200 and will increase carvedilol with return precautions and plan for close follow up with Dr. Berdine Addison. Systolic blood pressure 725 now. Final Clinical Impression(s) / ED Diagnoses Final diagnoses:  Hypertension, unspecified type    Rx / DC Orders ED Discharge Orders    None       Pattricia Boss, MD 05/19/20 1029

## 2020-05-22 ENCOUNTER — Other Ambulatory Visit: Payer: Self-pay

## 2020-05-22 ENCOUNTER — Ambulatory Visit (INDEPENDENT_AMBULATORY_CARE_PROVIDER_SITE_OTHER): Payer: Medicare HMO | Admitting: Medical

## 2020-05-22 ENCOUNTER — Encounter: Payer: Self-pay | Admitting: Medical

## 2020-05-22 VITALS — BP 152/92 | HR 83 | Ht 61.0 in | Wt 152.6 lb

## 2020-05-22 DIAGNOSIS — N182 Chronic kidney disease, stage 2 (mild): Secondary | ICD-10-CM

## 2020-05-22 DIAGNOSIS — I10 Essential (primary) hypertension: Secondary | ICD-10-CM

## 2020-05-22 DIAGNOSIS — E118 Type 2 diabetes mellitus with unspecified complications: Secondary | ICD-10-CM | POA: Diagnosis not present

## 2020-05-22 DIAGNOSIS — Z794 Long term (current) use of insulin: Secondary | ICD-10-CM

## 2020-05-22 DIAGNOSIS — E1165 Type 2 diabetes mellitus with hyperglycemia: Secondary | ICD-10-CM

## 2020-05-22 DIAGNOSIS — Z8616 Personal history of COVID-19: Secondary | ICD-10-CM

## 2020-05-22 DIAGNOSIS — IMO0002 Reserved for concepts with insufficient information to code with codable children: Secondary | ICD-10-CM

## 2020-05-22 NOTE — Progress Notes (Signed)
Subjective:  Tina Horton is a 55 y.o. female who presents for Chief Complaint  Patient presents with  . Hypertension    Was seen at [facility].      Here for hospital f/u and elevated blood pressures.  Here with husband today  Was seen recent at urgent care [facility] then sent to [facility] ED for hypertensive urgency.   Had BPs recent over 200/110.  Has no particular symptoms, no chest pain, no palpations, no edema.   Has been using her Coreg which was increased to 6.25 mg BID at the recent ED visit and has been taking her Losartan $RemoveBefo'100mg'OkNrlBAhUta$  daily.  She notes some worse salt intake over November and recent excessive caffeine intake.     No prior sleep study.  She did have covid infection late 01/2020.     No prior BP readings in the 200/110 region.  No other aggravating or relieving factors.    No other c/o.  The following portions of the patient's history were reviewed and updated as appropriate: allergies, current medications, past family history, past medical history, past social history, past surgical history and problem list.  ROS Otherwise as in subjective above  Past Medical History:  Diagnosis Date  . Anemia   . Arthritis    knees  . Bronchitis   . Diabetes mellitus without complication [facility])    age 15yo  . GERD (gastroesophageal reflux disease)   . Glaucoma    Dr. Einar Gip  . H/O mammogram 2005  . Headache   . Heart murmur   . Hyperlipidemia   . Hypertension   . Intermittent palpitations   . Legally blind   . Myopia   . Pneumonia   . Routine gynecological examination    last pap 2005  . Seasonal allergies   . Shortness of breath dyspnea   . Sinusitis   . Thyroid nodule     Current Outpatient Medications on File Prior to Visit  Medication Sig Dispense Refill  . Alcohol Swabs (B-D SINGLE USE SWABS REGULAR) PADS USE WHEN TESTING BLOOD SUGAR 300 each 0  . aspirin EC 81 MG tablet Take 1 tablet (81 mg total) by mouth daily. 90 tablet 3  . Blood  Glucose Monitoring Suppl (PRODIGY AUTOCODE BLOOD GLUCOSE) w/Device KIT     . carvedilol (COREG) 6.25 MG tablet Take 1 tablet (6.25 mg total) by mouth 2 (two) times daily. 60 tablet 0  . Cholecalciferol 50 MCG (2000 UT) TABS Take by mouth.    . dorzolamide-timolol (COSOPT) 22.3-6.8 MG/ML ophthalmic solution Place 1 drop into both eyes 2 times daily.    [name] glucose blood (PRODIGY NO CODING BLOOD GLUC) test strip Use as instructed 100 each 12  . glucose blood (PRODIGY NO CODING BLOOD GLUC) test strip TEST BLOOD SUGAR THREE TIMES DAILY 300 strip 0  . Insulin Pen Needle (NOVOFINE PLUS) 32G X 4 MM MISC Use 4 times a day with Lantus Insulin 1000 each 0  . latanoprost (XALATAN) 0.005 % ophthalmic solution Place 1 drop into both eyes at bedtime.    [name] LEVEMIR FLEXTOUCH 100 UNIT/ML FlexPen INJECT 40 UNITS SUBCUTANEOUSLY AT BEDTIME 15 mL 0  . losartan (COZAAR) 100 MG tablet Take 1 tablet (100 mg total) by mouth daily. 90 tablet 3  . NOVOLOG FLEXPEN 100 UNIT/ML FlexPen INJECT 20 UNITS SUBCUTANEOUSLY THREE TIMES DAILY WITH MEALS (USE SLIDING SCALE PROVIDED BY MD)(PEN EXPIRES AT 28 DAYS) 60 mL 5  . Prodigy Twist Top Lancets 28G  MISC USE TO TEST BLOOD SUGAR THREE TIMES DAILY 300 each 0  . Multiple Vitamins-Minerals (EMERGEN-C IMMUNE PLUS) PACK Take 1 tablet by mouth 2 (two) times daily. (Patient not taking: Reported on 05/22/2020) 10 each 0  . ondansetron (ZOFRAN) 4 MG tablet Take 1 tablet (4 mg total) by mouth every 8 (eight) hours as needed for nausea or vomiting. (Patient not taking: No sig reported) 20 tablet 0  . rosuvastatin (CRESTOR) 20 MG tablet Take 1 tablet (20 mg total) by mouth daily. (Patient not taking: Reported on 05/22/2020) 90 tablet 3   No current facility-administered medications on file prior to visit.     Objective: BP (!) 152/92   Pulse 83   Ht $R'5\' 1"'zI$  (1.549 m)   Wt 152 lb 9.6 oz (69.2 kg)   LMP 09/13/2015   SpO2 97%   BMI 28.83 kg/m    BP Readings from Last 3 Encounters:  05/22/20 (!)  152/92  05/19/20 (!) 162/88  03/30/20 (!) 161/89    General appearance: alert, no distress, well developed, well nourished Neck: supple, no lymphadenopathy, no thyromegaly, no masses, no bruits, no JVD Heart: RRR, normal S1, S2, no murmurs Lungs: CTA bilaterally, no wheezes, rhonchi, or rales Abdomen: +bs, soft, non tender, non distended, no masses, no hepatomegaly, no splenomegaly, no bruits Pulses: 2+ radial pulses, 2+ pedal pulses, normal cap refill Ext: no edema   Assessment: Encounter Diagnoses  Name Primary?  . Essential hypertension Yes  . Uncontrolled diabetes mellitus with complication, with long-term current use of insulin ([place])   . CKD (chronic kidney disease) stage 2, GFR 60-89 ml/min   . History of COVID-19      Plan: I reviewed her recent urgent care notes from [facility] health from December 30.   BP 230/118 at that visit.   At that time she reported compliance with her medications.  She was seen for hypertensive urgency.  She was advised to go to the emergency department which she did.  I reviewed the emergency department notes where her blood pressure was 188/92.  She has a history of poorly controlled hypertension noncompliance although she notes compliance with medicines recently.  She mainly is managed by nephrology for her blood pressure.  Prior allergy or intolerance to Amlodipine and Lisinopril.   I reviewed her August 2021 nephrology notes from [facility].  At that time she was on carvedilol 3.125 twice daily and losartan.  At that time BUN 18, creatinine 0.64 this is a CKD 2.  From her emergency department visit 05/19/2020, chest x-ray normal, no acute changes on EKG.  At the emergency department given no significant symptoms other than elevated blood pressure, she was given clonidine one dose, coreg was increased to 6.$RemoveBefore'25mg'NXwmwLzycsZjd$  BID, and she was told to follow-up with nephrology and her PCP  CKD 2-avoid NSAIDs and nephrotoxic drugs and  dehydration  Referral for sleep study to eval for OSA  Referral back to cardiology for other eval.   Last echo 2017, she continues to have some palpations on and off, had covid 01/2020 and BPs elevated since then.  For now continue the recently increased Coreg, c/t losartan, limit caffeine and salt.    [place] was seen today for hypertension.  Diagnoses and all orders for this visit:  Essential hypertension -     Ambulatory referral to Cardiology  Uncontrolled diabetes mellitus with complication, with long-term current use of insulin ([place]) -     Ambulatory referral to Cardiology  CKD (chronic kidney  disease) stage 2, GFR 60-89 ml/min -     Ambulatory referral to Cardiology  History of COVID-19    Follow up: pending sleep study and cardiology consult

## 2020-05-23 NOTE — Progress Notes (Signed)
Done

## 2020-05-26 ENCOUNTER — Other Ambulatory Visit: Payer: Self-pay | Admitting: Medical

## 2020-05-30 ENCOUNTER — Ambulatory Visit: Payer: Medicare HMO | Admitting: Cardiovascular Disease

## 2020-05-30 ENCOUNTER — Encounter: Payer: Medicare HMO | Admitting: Medical

## 2020-05-30 ENCOUNTER — Other Ambulatory Visit: Payer: Self-pay

## 2020-05-30 ENCOUNTER — Encounter: Payer: Self-pay | Admitting: Cardiovascular Disease

## 2020-05-30 VITALS — BP 128/72 | HR 76 | Ht 61.0 in | Wt 155.0 lb

## 2020-05-30 DIAGNOSIS — I10 Essential (primary) hypertension: Secondary | ICD-10-CM | POA: Diagnosis not present

## 2020-05-30 DIAGNOSIS — D631 Anemia in chronic kidney disease: Secondary | ICD-10-CM | POA: Diagnosis not present

## 2020-05-30 DIAGNOSIS — R809 Proteinuria, unspecified: Secondary | ICD-10-CM | POA: Diagnosis not present

## 2020-05-30 DIAGNOSIS — R011 Cardiac murmur, unspecified: Secondary | ICD-10-CM | POA: Diagnosis not present

## 2020-05-30 DIAGNOSIS — E559 Vitamin D deficiency, unspecified: Secondary | ICD-10-CM | POA: Diagnosis not present

## 2020-05-30 DIAGNOSIS — N189 Chronic kidney disease, unspecified: Secondary | ICD-10-CM | POA: Diagnosis not present

## 2020-05-30 DIAGNOSIS — I129 Hypertensive chronic kidney disease with stage 1 through stage 4 chronic kidney disease, or unspecified chronic kidney disease: Secondary | ICD-10-CM | POA: Diagnosis not present

## 2020-05-30 DIAGNOSIS — N1330 Unspecified hydronephrosis: Secondary | ICD-10-CM | POA: Diagnosis not present

## 2020-05-30 DIAGNOSIS — E1122 Type 2 diabetes mellitus with diabetic chronic kidney disease: Secondary | ICD-10-CM | POA: Diagnosis not present

## 2020-05-30 DIAGNOSIS — N182 Chronic kidney disease, stage 2 (mild): Secondary | ICD-10-CM | POA: Diagnosis not present

## 2020-05-30 DIAGNOSIS — E785 Hyperlipidemia, unspecified: Secondary | ICD-10-CM | POA: Diagnosis not present

## 2020-05-30 NOTE — Progress Notes (Signed)
05/30/2020 Tina Horton   13-May-1966  [id number]  Primary Physician Tysinger, Camelia Eng, PA-C Primary Cardiologist: Lorretta Harp MD FACP, [place], [place], [place]  HPI:  Tina Horton is a 55 y.o.  moderately overweight married [place] American female mother of 3 who I last saw in the office 08/24/2015.  She is accompanied by her husband Tina Horton today.[name] She was referred for preoperative clearance before elective hysterectomy. She currently is out of work for the last 2 years but did work as a Chemical engineer at [employer]. Her cardiac risk factor profile is notable for treated hypertension, diabetes and hyperlipidemia. She has never had a heart attack or stroke. She denies chest pain or shortness of breath. She does have a murmur apparently.  I obtain a 2D echocardiogram on 09/04/2015 which was entirely normal.  Routine GXT done for preoperative clearance for a hysterectomy was normal as well.  Since I saw her in the office 4 years ago she has done well.  She denies chest pain or shortness of breath.  She did have COVID-19 back in September.  She had a blood pressure spike on December 31 with pressures in the low 200s over low 100s but this has not recurred.  The etiology of this is unclear.  Her medications were adjusted.   Current Meds  Medication Sig  . Alcohol Swabs (B-D SINGLE USE SWABS REGULAR) PADS USE WHEN TESTING BLOOD SUGAR  . aspirin EC 81 MG tablet Take 1 tablet (81 mg total) by mouth daily.  . Blood Glucose Monitoring Suppl (PRODIGY AUTOCODE BLOOD GLUCOSE) w/Device KIT   . carvedilol (COREG) 6.25 MG tablet Take 1 tablet (6.25 mg total) by mouth 2 (two) times daily.  . Cholecalciferol 50 MCG (2000 UT) TABS Take by mouth.  . dorzolamide-timolol (COSOPT) 22.3-6.8 MG/ML ophthalmic solution Place 1 drop into both eyes 2 times daily.  [name] glucose blood (PRODIGY NO CODING BLOOD GLUC) test strip Use as instructed  . Insulin Pen Needle (NOVOFINE PLUS) 32G X 4 MM MISC Use 4 times a day  with Lantus Insulin  . latanoprost (XALATAN) 0.005 % ophthalmic solution Place 1 drop into both eyes at bedtime.  [name] LEVEMIR FLEXTOUCH 100 UNIT/ML FlexPen INJECT 40 UNITS SUBCUTANEOUSLY AT BEDTIME (Patient taking differently: Inject 38 Units into the skin at bedtime.)  . losartan (COZAAR) 100 MG tablet Take 1 tablet (100 mg total) by mouth daily.  [name] NOVOLOG FLEXPEN 100 UNIT/ML FlexPen INJECT 20 UNITS SUBCUTANEOUSLY THREE TIMES DAILY WITH MEALS (USE SLIDING SCALE PROVIDED BY MD)(PEN EXPIRES AT 28 DAYS) (Patient taking differently: Inject 16 Units into the skin. INJECT 20 UNITS SUBCUTANEOUSLY THREE TIMES DAILY WITH MEALS (USE SLIDING SCALE PROVIDED BY MD)(PEN EXPIRES AT 28 DAYS))  . PRODIGY NO CODING BLOOD GLUC test strip TEST BLOOD SUGAR THREE TIMES DAILY  . Prodigy Twist Top Lancets 28G MISC USE TO TEST BLOOD SUGAR THREE TIMES DAILY  . [DISCONTINUED] Multiple Vitamins-Minerals (EMERGEN-C IMMUNE PLUS) PACK Take 1 tablet by mouth 2 (two) times daily.  . [DISCONTINUED] ondansetron (ZOFRAN) 4 MG tablet Take 1 tablet (4 mg total) by mouth every 8 (eight) hours as needed for nausea or vomiting.  . [DISCONTINUED] rosuvastatin (CRESTOR) 20 MG tablet Take 1 tablet (20 mg total) by mouth daily.     Allergies  Allergen Reactions  . Amlodipine     Swelling   . Lisinopril     Refuses, says it causes cancer  . Metformin And Related  Vomiting, diarrhea, nausea  . Tanzeum [Albiglutide] Nausea And Vomiting    nausea    Social History   Socioeconomic History  . Marital status: Married    Spouse name: Not on file  . Number of children: Not on file  . Years of education: Not on file  . Highest education level: Not on file  Occupational History  . Not on file  Tobacco Use  . Smoking status: Never Smoker  . Smokeless tobacco: Never Used  Vaping Use  . Vaping Use: Never used  Substance and Sexual Activity  . Alcohol use: No    Alcohol/week: 0.0 standard drinks  . Drug use: No  . Sexual  activity: Not on file  Other Topics Concern  . Not on file  Social History Narrative   Married, has 3 children, not exercising, but walks at work.     Lives in a one story home.     On disability.  Education: some college.   Social Determinants of Health   Financial Resource Strain: Not on file  Food Insecurity: Not on file  Transportation Needs: Not on file  Physical Activity: Not on file  Stress: Not on file  Social Connections: Not on file  Intimate Partner Violence: Not on file     Review of Systems: General: negative for chills, fever, night sweats or weight changes.  Cardiovascular: negative for chest pain, dyspnea on exertion, edema, orthopnea, palpitations, paroxysmal nocturnal dyspnea or shortness of breath Dermatological: negative for rash Respiratory: negative for cough or wheezing Urologic: negative for hematuria Abdominal: negative for nausea, vomiting, diarrhea, bright red blood per rectum, melena, or hematemesis Neurologic: negative for visual changes, syncope, or dizziness All other systems reviewed and are otherwise negative except as noted above.    Blood pressure 128/72, pulse 76, height _0  (1.549 m), weight 155 lb (70.3 kg), last menstrual period 09/13/2015.  General appearance: alert and no distress Neck: no adenopathy, no carotid bruit, no JVD, supple, symmetrical, trachea midline and thyroid not enlarged, symmetric, no tenderness/mass/nodules Lungs: clear to auscultation bilaterally Heart: regular rate and rhythm, S1, S2 normal, no murmur, click, rub or gallop Extremities: extremities normal, atraumatic, no cyanosis or edema Pulses: 2+ and symmetric Skin: Skin color, texture, turgor normal. No rashes or lesions Neurologic: Alert and oriented X 3, normal strength and tone. Normal symmetric reflexes. Normal coordination and gait  EKG not performed today  ASSESSMENT AND PLAN:   SYSTOLIC MURMUR History of systolic murmur in the past with a 2D echo  performed 4/70/17 that showed no valvular abnormalities that would contribute to this.  Essential hypertension History of essential potential blood pressure measured today 128/72.  She is on carvedilol and losartan.  Hyperlipidemia History of hyperlipidemia currently not on statin therapy with lipid profile performed 11/29/2019 revealing total cholesterol 225, LDL 149 and HDL 58.  She prefers to treat this with dietary modification.  We will recheck a lipid liver profile in 3 months.      Lorretta Harp MD FACP,FACC,FAHA, [facility] 05/30/2020 3:33 PM

## 2020-05-30 NOTE — Assessment & Plan Note (Signed)
History of systolic murmur in the past with a 2D echo performed 4/70/17 that showed no valvular abnormalities that would contribute to this.

## 2020-05-30 NOTE — Assessment & Plan Note (Signed)
History of essential potential blood pressure measured today 128/72.  She is on carvedilol and losartan.

## 2020-05-30 NOTE — Patient Instructions (Signed)
Medication Instructions:  Your physician recommends that you continue on your current medications as directed. Please refer to the Current Medication list given to you today.  *If you need a refill on your cardiac medications before your next appointment, please call your pharmacy*   Lab Work: Your physician recommends that you return for lab work in: 3 months lipid/liver profile.  If you have labs (blood work) drawn today and your tests are completely normal, you will receive your results only by: [name] MyChart Message (if you have MyChart) OR . A paper copy in the mail If you have any lab test that is abnormal or we need to change your treatment, we will call you to review the results.   Follow-Up: At [facility], you and your health needs are our priority.  As part of our continuing mission to provide you with exceptional heart care, we have created designated Provider Care Teams.  These Care Teams include your primary Cardiologist (physician) and Advanced Practice Providers (APPs -  Physician Assistants and Nurse Practitioners) who all work together to provide you with the care you need, when you need it.  We recommend signing up for the patient portal called "MyChart".  Sign up information is provided on this After Visit Summary.  MyChart is used to connect with patients for Virtual Visits (Telemedicine).  Patients are able to view lab/test results, encounter notes, upcoming appointments, etc.  Non-urgent messages can be sent to your provider as well.   To learn more about what you can do with MyChart, go to [email or web address].    Your next appointment:   12 month(s)  The format for your next appointment:   In Person  Provider:   Quay Burow, MD

## 2020-05-30 NOTE — Assessment & Plan Note (Signed)
History of hyperlipidemia currently not on statin therapy with lipid profile performed 11/29/2019 revealing total cholesterol 225, LDL 149 and HDL 58.  She prefers to treat this with dietary modification.  We will recheck a lipid liver profile in 3 months.

## 2020-06-02 ENCOUNTER — Encounter (HOSPITAL_COMMUNITY): Payer: Self-pay | Admitting: Emergency Medicine

## 2020-06-02 ENCOUNTER — Emergency Department (HOSPITAL_COMMUNITY)
Admission: EM | Admit: 2020-06-02 | Discharge: 2020-06-03 | Disposition: A | Discharge status: Home / Self Care | Payer: Medicare HMO | Attending: Emergency Medicine | Admitting: Emergency Medicine

## 2020-06-02 ENCOUNTER — Other Ambulatory Visit: Payer: Self-pay | Admitting: Medical

## 2020-06-02 DIAGNOSIS — I10 Essential (primary) hypertension: Secondary | ICD-10-CM | POA: Diagnosis not present

## 2020-06-02 DIAGNOSIS — Z7982 Long term (current) use of aspirin: Secondary | ICD-10-CM | POA: Insufficient documentation

## 2020-06-02 DIAGNOSIS — N182 Chronic kidney disease, stage 2 (mild): Secondary | ICD-10-CM | POA: Insufficient documentation

## 2020-06-02 DIAGNOSIS — I129 Hypertensive chronic kidney disease with stage 1 through stage 4 chronic kidney disease, or unspecified chronic kidney disease: Secondary | ICD-10-CM | POA: Insufficient documentation

## 2020-06-02 DIAGNOSIS — Z794 Long term (current) use of insulin: Secondary | ICD-10-CM | POA: Insufficient documentation

## 2020-06-02 DIAGNOSIS — Z79899 Other long term (current) drug therapy: Secondary | ICD-10-CM | POA: Insufficient documentation

## 2020-06-02 DIAGNOSIS — E119 Type 2 diabetes mellitus without complications: Secondary | ICD-10-CM | POA: Diagnosis not present

## 2020-06-02 DIAGNOSIS — R42 Dizziness and giddiness: Secondary | ICD-10-CM

## 2020-06-02 DIAGNOSIS — E1122 Type 2 diabetes mellitus with diabetic chronic kidney disease: Secondary | ICD-10-CM | POA: Diagnosis not present

## 2020-06-02 DIAGNOSIS — I160 Hypertensive urgency: Secondary | ICD-10-CM | POA: Insufficient documentation

## 2020-06-02 DIAGNOSIS — Z8616 Personal history of COVID-19: Secondary | ICD-10-CM | POA: Diagnosis not present

## 2020-06-02 LAB — BASIC METABOLIC PANEL
Anion gap: 12 (ref 5–15)
BUN: 25 mg/dL — ABNORMAL HIGH (ref 6–20)
CO2: 23 mmol/L (ref 22–32)
Calcium: 9.9 mg/dL (ref 8.9–10.3)
Chloride: 104 mmol/L (ref 98–111)
Creatinine, Ser: 0.85 mg/dL (ref 0.44–1.00)
GFR, Estimated: >60 mL/min (ref >60)
Glucose, Bld: 157 mg/dL — ABNORMAL HIGH (ref 70–99)
Potassium: 3.8 mmol/L (ref 3.5–5.1)
Sodium: 139 mmol/L (ref 135–145)

## 2020-06-02 LAB — CBC
HCT: 38.7 % (ref 36.0–46.0)
Hemoglobin: 12.7 g/dL (ref 12.0–15.0)
MCH: 29.3 pg (ref 26.0–34.0)
MCHC: 32.8 g/dL (ref 30.0–36.0)
MCV: 89.2 fL (ref 80.0–100.0)
Platelets: 263 10*3/uL (ref 150–400)
RBC: 4.34 MIL/uL (ref 3.87–5.11)
RDW: 12.1 % (ref 11.5–15.5)
WBC: 9.1 10*3/uL (ref 4.0–10.5)
nRBC: 0 % (ref 0.0–0.2)

## 2020-06-02 LAB — I-STAT BETA HCG BLOOD, ED (MC, WL, AP ONLY): I-stat hCG, quantitative: <5 m[IU]/mL (ref <5)

## 2020-06-02 LAB — URINALYSIS, ROUTINE W REFLEX MICROSCOPIC
Bilirubin Urine: NEGATIVE
Glucose, UA: NEGATIVE mg/dL
Hgb urine dipstick: NEGATIVE
Ketones, ur: NEGATIVE mg/dL
Leukocytes,Ua: NEGATIVE
Nitrite: NEGATIVE
Protein, ur: NEGATIVE mg/dL
Specific Gravity, Urine: 1.005 (ref 1.005–1.030)
pH: 7 (ref 5.0–8.0)

## 2020-06-02 LAB — CBG MONITORING, ED: Glucose-Capillary: 146 mg/dL — ABNORMAL HIGH (ref 70–99)

## 2020-06-02 NOTE — ED Triage Notes (Signed)
Patient here from home via EMS reporting dizziness and feeling lightheaded while standing up in the kitchen today. Hypertensive, hx of same.Mid chest tightness.

## 2020-06-02 NOTE — ED Provider Notes (Signed)
[place] DEPT Provider Note   CSN: [id number] Arrival date & time: 06/02/20  2000     History Chief Complaint  Patient presents with  . Dizziness  . Hypertension    Tina Horton is a 55 y.o. female.  The history is provided by the patient and medical records. No language interpreter was used.  Dizziness Hypertension     55 year old female significant history of hypertension, diabetes, anemia, legally blind, thyroid disease, presenting complaining of dizziness.  Patient reports today she felt dizzy described as a room spinning sensation.  She did check her blood sugar and it was normal, she also checked her blood pressure and states that it was quite elevated prompting this ER visit.  She reported feeling shaky for the past few hours, she also reports some mild tightness in her chest.  She has had some congestion and left ear pain for the past few days.  She has had COVID in September of last year.  She denies any recent medication changes and states she has been compliant with her medication.  She denies taking any over-the-counter medication for symptoms.  She admits having history of kidney disease as well as history of high blood pressure.  She also mentioned having history of thyroid disease.  She reported dizziness is mild at this time.  No report of any focal numbness or focal weakness and denies any change in mental status  Past Medical History:  Diagnosis Date  . Anemia   . Arthritis    knees  . Bronchitis   . Diabetes mellitus without complication [facility])    age 28yo  . GERD (gastroesophageal reflux disease)   . Glaucoma    Dr. Einar Gip  . H/O mammogram 2005  . Headache   . Heart murmur   . Hyperlipidemia   . Hypertension   . Intermittent palpitations   . Legally blind   . Myopia   . Pneumonia   . Routine gynecological examination    last pap 2005  . Seasonal allergies   . Shortness of breath dyspnea   . Sinusitis   .  Thyroid nodule     Patient Active Problem List   Diagnosis Date Noted  . History of COVID-19 05/22/2020  . COVID-19 virus infection 02/25/2020  . Sinus pressure 02/25/2020  . Acute non-recurrent frontal sinusitis 02/25/2020  . Chronic pain of both knees 01/04/2020  . 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 CKD (chronic kidney disease) stage 2, GFR 60-89 ml/min 11/29/2019  . 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 Vitamin D deficiency 03/13/2018  . Chronic kidney disease 01/20/2018  . Primary osteoarthritis of both knees 01/20/2018  . DDD (degenerative disc disease), lumbar 01/20/2018  . Localized swelling, mass or lump of neck 10/30/2017  . Microalbuminuria 08/26/2017  . Mass of neck 08/26/2017  . Screening for cancer 08/20/2017  . Left leg pain 08/20/2017  . Respiratory tract infection 08/20/2017  . Nausea 08/20/2017  . Renal cyst 01/15/2016  . Hydronephrosis of left kidney 01/15/2016  . Spinal stenosis 01/15/2016  . Anemia, iron deficiency 12/04/2015  . History of burning pain in leg 12/04/2015  . S/P laparoscopic assisted vaginal hysterectomy (LAVH) 09/13/2015  . Encounter for health maintenance examination in adult 08/21/2015  . Hyperlipidemia 08/21/2015  . Glaucoma 08/21/2015  . Heart murmur 08/21/2015  . Thyroid nodule 08/21/2015  . Vaccine refused by patient 08/21/2015  . Noncompliance 03/21/2015  . Essential hypertension 03/21/2015  . Uncontrolled diabetes mellitus with complication, with long-term  current use of insulin ([place]) 03/21/2015  . Lymphadenitis 03/21/2015  . SKIN LESION 10/19/2008  . SYSTOLIC MURMUR 83/41/9622  . GLAUCOMA 07/17/2006  . VISUAL DISTURBANCE NOS 07/17/2006  . REFLUX ESOPHAGITIS 07/17/2006    Past Surgical History:  Procedure Laterality Date  . CYSTECTOMY     umbilicus  . INCISION AND DRAINAGE     abdominal superficial abscess  . LAPAROSCOPIC VAGINAL HYSTERECTOMY WITH SALPINGECTOMY Bilateral 09/13/2015   Procedure: LAPAROSCOPIC ASSISTED VAGINAL  HYSTERECTOMY WITH SALPINGECTOMY, McCalls Colpoplasty;  Surgeon: Thurnell Lose, MD;  Location: [facility];  Service: Gynecology;  Laterality: Bilateral;  . LASIK     x2     OB History   No obstetric history on file.     Family History  Problem Relation Age of Onset  . Diabetes Mother   . Hypertension Mother   . Stroke Mother   . Hypertension Father   . Chronic Renal Failure Father   . Hypertension Sister   . Hypertension Brother   . Stroke Maternal Grandmother   . Asthma Daughter   . Healthy Daughter   . Healthy Son   . Cancer Neg Hx   . Heart disease Neg Hx     Social History   Tobacco Use  . Smoking status: Never Smoker  . Smokeless tobacco: Never Used  Vaping Use  . Vaping Use: Never used  Substance Use Topics  . Alcohol use: No    Alcohol/week: 0.0 standard drinks  . Drug use: No    Home Medications Prior to Admission medications   Medication Sig Start Date End Date Taking? Authorizing Provider  Alcohol Swabs (B-D SINGLE USE SWABS REGULAR) PADS USE  WHEN  TESTING BLOOD SUGAR 06/02/20   Tysinger, Camelia Eng, PA-C  aspirin EC 81 MG tablet Take 1 tablet (81 mg total) by mouth daily. 11/30/19   Tysinger, Camelia Eng, PA-C  Blood Glucose Monitoring Suppl (PRODIGY AUTOCODE BLOOD GLUCOSE) w/Device KIT  12/07/19   [provider]  carvedilol (COREG) 6.25 MG tablet Take 1 tablet (6.25 mg total) by mouth 2 (two) times daily. 05/19/20   Pattricia Boss, MD  Cholecalciferol 50 MCG (2000 UT) TABS Take by mouth. 01/21/19   [provider]  dorzolamide-timolol (COSOPT) 22.3-6.8 MG/ML ophthalmic solution Place 1 drop into both eyes 2 times daily. 12/23/19 12/22/20  [provider]  glucose blood (PRODIGY NO CODING BLOOD GLUC) test strip Use as instructed 11/05/17   Tysinger, Camelia Eng, PA-C  Insulin Pen Needle (NOVOFINE PLUS) 32G X 4 MM MISC Use 4 times a day with Lantus Insulin 12/24/19   Tysinger, Camelia Eng, PA-C  latanoprost (XALATAN) 0.005 % ophthalmic solution Place 1 drop  into both eyes at bedtime.    [provider]  LEVEMIR FLEXTOUCH 100 UNIT/ML FlexPen INJECT 40 UNITS SUBCUTANEOUSLY AT BEDTIME Patient taking differently: Inject 38 Units into the skin at bedtime. 05/17/20   Tysinger, Camelia Eng, PA-C  losartan (COZAAR) 100 MG tablet Take 1 tablet (100 mg total) by mouth daily. 12/07/19   Tysinger, Camelia Eng, PA-C  NOVOLOG FLEXPEN 100 UNIT/ML FlexPen INJECT 20 UNITS SUBCUTANEOUSLY THREE TIMES DAILY WITH MEALS (USE SLIDING SCALE PROVIDED BY MD)(PEN EXPIRES AT 28 DAYS) Patient taking differently: Inject 16 Units into the skin. INJECT 20 UNITS SUBCUTANEOUSLY THREE TIMES DAILY WITH MEALS (USE SLIDING SCALE PROVIDED BY MD)(PEN EXPIRES AT 28 DAYS) 03/31/20   Tysinger, Camelia Eng, PA-C  PRODIGY NO CODING BLOOD GLUC test strip TEST BLOOD SUGAR THREE TIMES DAILY 05/26/20   Chana Bode  S, PA-C  Prodigy Twist Top Lancets 28G MISC USE TO TEST BLOOD SUGAR THREE TIMES DAILY 05/26/20   Tysinger, Kermit Balo, PA-C    Allergies    Amlodipine, Lisinopril, Metformin and related, and Tanzeum [albiglutide]  Review of Systems   Review of Systems  Neurological: Positive for dizziness.  All other systems reviewed and are negative.   Physical Exam Updated Vital Signs BP (!) 191/93 (BP Location: Right Arm)   Pulse 86   Temp 99.2 F (37.3 C) (Oral)   Resp 16   LMP 09/13/2015   SpO2 98%   Physical Exam Vitals and nursing note reviewed.  Constitutional:      General: She is not in acute distress.    Appearance: She is well-developed and well-nourished. She is obese.  HENT:     Head: Atraumatic.     Right Ear: Tympanic membrane normal.     Left Ear: Tympanic membrane normal.     Mouth/Throat:     Mouth: Mucous membranes are moist.  Eyes:     Conjunctiva/sclera: Conjunctivae normal.     Pupils: Pupils are equal, round, and reactive to light.  Cardiovascular:     Rate and Rhythm: Normal rate and regular rhythm.     Pulses: Normal pulses.     Heart sounds: Normal heart  sounds.  Pulmonary:     Effort: Pulmonary effort is normal.     Breath sounds: Normal breath sounds. No wheezing, rhonchi or rales.  Abdominal:     Palpations: Abdomen is soft.     Tenderness: There is no abdominal tenderness.  Musculoskeletal:     Cervical back: Neck supple.     Comments: 5 out of 5 strength all 4 extremities.  Skin:    General: Skin is warm.     Findings: No rash.  Neurological:     General: No focal deficit present.     Mental Status: She is alert and oriented to person, place, and time.     GCS: GCS eye subscore is 4. GCS verbal subscore is 5. GCS motor subscore is 6.     Cranial Nerves: Cranial nerves are intact.     Sensory: Sensation is intact.     Motor: Motor function is intact.  Psychiatric:        Mood and Affect: Mood and affect and mood normal.     ED Results / Procedures / Treatments   Labs (all labs ordered are listed, but only abnormal results are displayed) Labs Reviewed  BASIC METABOLIC PANEL - Abnormal; Notable for the following components:      Result Value   Glucose, Bld 157 (*)    BUN 25 (*)    All other components within normal limits  URINALYSIS, ROUTINE W REFLEX MICROSCOPIC - Abnormal; Notable for the following components:   Color, Urine COLORLESS (*)    All other components within normal limits  CBG MONITORING, ED - Abnormal; Notable for the following components:   Glucose-Capillary 146 (*)    All other components within normal limits  CBC  TSH  D-DIMER, QUANTITATIVE (NOT AT [facility])  I-STAT BETA HCG BLOOD, ED ([facility], [facility], [facility] ONLY)  TROPONIN I (HIGH SENSITIVITY)  TROPONIN I (HIGH SENSITIVITY)    EKG None  ED ECG REPORT   Date: 06/03/2020  Rate: 77  Rhythm: normal sinus rhythm  QRS Axis: normal  Intervals: normal  ST/T Wave abnormalities: nonspecific ST changes  Conduction Disutrbances:none  Narrative Interpretation:   Old EKG Reviewed: unchanged  I  have personally reviewed the EKG tracing and agree with the computerized  printout as noted.   Radiology No results found.  Procedures Procedures (including critical care time)  Medications Ordered in ED Medications  labetalol (NORMODYNE) injection 20 mg (20 mg Intravenous Given 06/03/20 0056)  meclizine (ANTIVERT) tablet 25 mg (25 mg Oral Given 06/03/20 0041)    ED Course  I have reviewed the triage vital signs and the nursing notes.  Pertinent labs & imaging results that were available during my care of the patient were reviewed by me and considered in my medical decision making (see chart for details).    MDM Rules/Calculators/A&P                          BP (!) 154/69   Pulse 78   Temp 99.2 F (37.3 C) (Oral)   Resp 18   LMP 09/13/2015   SpO2 97%   Final Clinical Impression(s) / ED Diagnoses Final diagnoses:  Hypertensive urgency  Vertigo    Rx / DC Orders ED Discharge Orders    None     12:05 AM Patient reported feeling dizzy and described as room spinning sensation as well as noticing that her blood pressure is elevated today.  She reported being compliant with her medication.  She was previously had COVID in September of last year.  She does complain of some congestion and ear discomfort for the past few days.  Patient was found to have a blood pressure of 225/96.  Normal orthostatic vital signs.  She has no focal neurodeficit on exam.  Labs are reassuring, no findings to suggest hypertensive emergency.  3:41 AM Negative delta troponin, D-dimer is negative, blood pressure slightly improved with labetalol.  Suspect her symptoms may be secondary to underlying hypertension.  Encourage patient to follow-up closely with PCP for blood pressure recheck and medication changes if appropriate.  Return precautions discussed.   Domenic Moras, PA-C 06/03/20 [place], Bagnell, MD 06/03/20 [phone]

## 2020-06-02 NOTE — ED Provider Notes (Incomplete)
[place] DEPT Provider Note   CSN: [id number] Arrival date & time: 06/02/20  2000     History Chief Complaint  Patient presents with  . Dizziness  . Hypertension    Tina Horton is a 55 y.o. female.  HPI   55 year old female significant history of hypertension, diabetes, anemia, legally blind, thyroid disease, presenting complaining of  Past Medical History:  Diagnosis Date  . Anemia   . Arthritis    knees  . Bronchitis   . Diabetes mellitus without complication [facility])    age 15yo  . GERD (gastroesophageal reflux disease)   . Glaucoma    Dr. Einar Gip  . H/O mammogram 2005  . Headache   . Heart murmur   . Hyperlipidemia   . Hypertension   . Intermittent palpitations   . Legally blind   . Myopia   . Pneumonia   . Routine gynecological examination    last pap 2005  . Seasonal allergies   . Shortness of breath dyspnea   . Sinusitis   . Thyroid nodule     Patient Active Problem List   Diagnosis Date Noted  . History of COVID-19 05/22/2020  . COVID-19 virus infection 02/25/2020  . Sinus pressure 02/25/2020  . Acute non-recurrent frontal sinusitis 02/25/2020  . Chronic pain of both knees 01/04/2020  . 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 CKD (chronic kidney disease) stage 2, GFR 60-89 ml/min 11/29/2019  . 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 Vitamin D deficiency 03/13/2018  . Chronic kidney disease 01/20/2018  . Primary osteoarthritis of both knees 01/20/2018  . DDD (degenerative disc disease), lumbar 01/20/2018  . Localized swelling, mass or lump of neck 10/30/2017  . Microalbuminuria 08/26/2017  . Mass of neck 08/26/2017  . Screening for cancer 08/20/2017  . Left leg pain 08/20/2017  . Respiratory tract infection 08/20/2017  . Nausea 08/20/2017  . Renal cyst 01/15/2016  . Hydronephrosis of left kidney 01/15/2016  . Spinal stenosis 01/15/2016  . Anemia, iron deficiency 12/04/2015  . History of burning pain in leg  12/04/2015  . S/P laparoscopic assisted vaginal hysterectomy (LAVH) 09/13/2015  . Encounter for health maintenance examination in adult 08/21/2015  . Hyperlipidemia 08/21/2015  . Glaucoma 08/21/2015  . Heart murmur 08/21/2015  . Thyroid nodule 08/21/2015  . Vaccine refused by patient 08/21/2015  . Noncompliance 03/21/2015  . Essential hypertension 03/21/2015  . Uncontrolled diabetes mellitus with complication, with long-term current use of insulin ([place]) 03/21/2015  . Lymphadenitis 03/21/2015  . SKIN LESION 10/19/2008  . SYSTOLIC MURMUR 47/82/9562  . GLAUCOMA 07/17/2006  . VISUAL DISTURBANCE NOS 07/17/2006  . REFLUX ESOPHAGITIS 07/17/2006    Past Surgical History:  Procedure Laterality Date  . CYSTECTOMY     umbilicus  . INCISION AND DRAINAGE     abdominal superficial abscess  . LAPAROSCOPIC VAGINAL HYSTERECTOMY WITH SALPINGECTOMY Bilateral 09/13/2015   Procedure: LAPAROSCOPIC ASSISTED VAGINAL HYSTERECTOMY WITH SALPINGECTOMY, McCalls Colpoplasty;  Surgeon: Thurnell Lose, MD;  Location: [place] [facility];  Service: Gynecology;  Laterality: Bilateral;  . LASIK     x2     OB History   No obstetric history on file.     Family History  Problem Relation Age of Onset  . Diabetes Mother   . Hypertension Mother   . Stroke Mother   . Hypertension Father   . Chronic Renal Failure Father   . Hypertension Sister   . Hypertension Brother   . Stroke Maternal Grandmother   . Asthma Daughter   .  Healthy Daughter   . Healthy Son   . Cancer Neg Hx   . Heart disease Neg Hx     Social History   Tobacco Use  . Smoking status: Never Smoker  . Smokeless tobacco: Never Used  Vaping Use  . Vaping Use: Never used  Substance Use Topics  . Alcohol use: No    Alcohol/week: 0.0 standard drinks  . Drug use: No    Home Medications Prior to Admission medications   Medication Sig Start Date End Date Taking? Authorizing Provider  Alcohol Swabs (B-D SINGLE USE SWABS REGULAR) PADS USE  WHEN   TESTING BLOOD SUGAR 06/02/20   Tysinger, Camelia Eng, PA-C  aspirin EC 81 MG tablet Take 1 tablet (81 mg total) by mouth daily. 11/30/19   Tysinger, Camelia Eng, PA-C  Blood Glucose Monitoring Suppl (PRODIGY AUTOCODE BLOOD GLUCOSE) w/Device KIT  12/07/19   [provider]  carvedilol (COREG) 6.25 MG tablet Take 1 tablet (6.25 mg total) by mouth 2 (two) times daily. 05/19/20   Pattricia Boss, MD  Cholecalciferol 50 MCG (2000 UT) TABS Take by mouth. 01/21/19   [provider]  dorzolamide-timolol (COSOPT) 22.3-6.8 MG/ML ophthalmic solution Place 1 drop into both eyes 2 times daily. 12/23/19 12/22/20  [provider]  glucose blood (PRODIGY NO CODING BLOOD GLUC) test strip Use as instructed 11/05/17   Tysinger, Camelia Eng, PA-C  Insulin Pen Needle (NOVOFINE PLUS) 32G X 4 MM MISC Use 4 times a day with Lantus Insulin 12/24/19   Tysinger, Camelia Eng, PA-C  latanoprost (XALATAN) 0.005 % ophthalmic solution Place 1 drop into both eyes at bedtime.    [provider]  LEVEMIR FLEXTOUCH 100 UNIT/ML FlexPen INJECT 40 UNITS SUBCUTANEOUSLY AT BEDTIME Patient taking differently: Inject 38 Units into the skin at bedtime. 05/17/20   Tysinger, Camelia Eng, PA-C  losartan (COZAAR) 100 MG tablet Take 1 tablet (100 mg total) by mouth daily. 12/07/19   Tysinger, Camelia Eng, PA-C  NOVOLOG FLEXPEN 100 UNIT/ML FlexPen INJECT 20 UNITS SUBCUTANEOUSLY THREE TIMES DAILY WITH MEALS (USE SLIDING SCALE PROVIDED BY MD)(PEN EXPIRES AT 28 DAYS) Patient taking differently: Inject 16 Units into the skin. INJECT 20 UNITS SUBCUTANEOUSLY THREE TIMES DAILY WITH MEALS (USE SLIDING SCALE PROVIDED BY MD)(PEN EXPIRES AT 28 DAYS) 03/31/20   Tysinger, Camelia Eng, PA-C  PRODIGY NO CODING BLOOD GLUC test strip TEST BLOOD SUGAR THREE TIMES DAILY 05/26/20   Tysinger, Camelia Eng, PA-C  Prodigy Twist Top Lancets 28G MISC USE TO TEST BLOOD SUGAR THREE TIMES DAILY 05/26/20   Tysinger, Camelia Eng, PA-C    Allergies    Amlodipine, Lisinopril, Metformin and  related, and Tanzeum [albiglutide]  Review of Systems   Review of Systems  Physical Exam Updated Vital Signs BP (!) 191/93 (BP Location: Right Arm)   Pulse 86   Temp 99.2 F (37.3 C) (Oral)   Resp 16   LMP 09/13/2015   SpO2 98%   Physical Exam  ED Results / Procedures / Treatments   Labs (all labs ordered are listed, but only abnormal results are displayed) Labs Reviewed  BASIC METABOLIC PANEL - Abnormal; Notable for the following components:      Result Value   Glucose, Bld 157 (*)    BUN 25 (*)    All other components within normal limits  URINALYSIS, ROUTINE W REFLEX MICROSCOPIC - Abnormal; Notable for the following components:   Color, Urine COLORLESS (*)    All other components within normal limits  CBG MONITORING, ED -  Abnormal; Notable for the following components:   Glucose-Capillary 146 (*)    All other components within normal limits  CBC  I-STAT BETA HCG BLOOD, ED ([facility], [facility], [facility] ONLY)    EKG None  Radiology No results found.  Procedures Procedures (including critical care time)  Medications Ordered in ED Medications - No data to display  ED Course  I have reviewed the triage vital signs and the nursing notes.  Pertinent labs & imaging results that were available during my care of the patient were reviewed by me and considered in my medical decision making (see chart for details).    MDM Rules/Calculators/A&P                          *** Final Clinical Impression(s) / ED Diagnoses Final diagnoses:  None    Rx / DC Orders ED Discharge Orders    None

## 2020-06-03 LAB — TROPONIN I (HIGH SENSITIVITY)
Troponin I (High Sensitivity): 4 ng/L (ref <18)
Troponin I (High Sensitivity): 6 ng/L (ref <18)

## 2020-06-03 LAB — D-DIMER, QUANTITATIVE: D-Dimer, Quant: 0.36 ug{FEU}/mL (ref 0.00–0.50)

## 2020-06-03 LAB — TSH: TSH: 1.501 u[IU]/mL (ref 0.350–4.500)

## 2020-06-03 MED ORDER — MECLIZINE HCL 25 MG PO TABS
25.0000 mg | ORAL_TABLET | Freq: Once | ORAL | Status: AC
  Administered 2020-06-03: 25 mg via ORAL
  Filled 2020-06-03: qty 1

## 2020-06-03 MED ORDER — MECLIZINE HCL 25 MG PO TABS: 25.0000 mg | ORAL_TABLET | Freq: Three times a day (TID) | ORAL | 0 refills | Status: DC | PRN

## 2020-06-03 MED ORDER — LABETALOL HCL 5 MG/ML IV SOLN
20.0000 mg | Freq: Once | INTRAVENOUS | Status: AC
  Administered 2020-06-03: 20 mg via INTRAVENOUS
  Filled 2020-06-03: qty 4

## 2020-06-03 NOTE — Discharge Instructions (Signed)
Your symptoms may be due to poorly control high blood pressure.  Please follow-up closely with your doctor for recheck and medication readjustment if indicated.  Take meclizine as needed for dizziness.  Return if you have any concerns.

## 2020-06-05 ENCOUNTER — Ambulatory Visit: Payer: Medicare HMO | Admitting: Internal Medicine

## 2020-06-13 ENCOUNTER — Encounter: Payer: Self-pay | Admitting: Medical

## 2020-06-13 ENCOUNTER — Ambulatory Visit (INDEPENDENT_AMBULATORY_CARE_PROVIDER_SITE_OTHER): Payer: Medicare HMO | Admitting: Medical

## 2020-06-13 ENCOUNTER — Other Ambulatory Visit: Payer: Self-pay

## 2020-06-13 VITALS — BP 142/88 | HR 84 | Ht 61.0 in | Wt 153.0 lb

## 2020-06-13 DIAGNOSIS — R21 Rash and other nonspecific skin eruption: Secondary | ICD-10-CM | POA: Diagnosis not present

## 2020-06-13 DIAGNOSIS — E118 Type 2 diabetes mellitus with unspecified complications: Secondary | ICD-10-CM | POA: Diagnosis not present

## 2020-06-13 DIAGNOSIS — I10 Essential (primary) hypertension: Secondary | ICD-10-CM | POA: Diagnosis not present

## 2020-06-13 DIAGNOSIS — N182 Chronic kidney disease, stage 2 (mild): Secondary | ICD-10-CM

## 2020-06-13 DIAGNOSIS — E1165 Type 2 diabetes mellitus with hyperglycemia: Secondary | ICD-10-CM

## 2020-06-13 DIAGNOSIS — IMO0002 Reserved for concepts with insufficient information to code with codable children: Secondary | ICD-10-CM

## 2020-06-13 DIAGNOSIS — Z794 Long term (current) use of insulin: Secondary | ICD-10-CM | POA: Diagnosis not present

## 2020-06-13 NOTE — Progress Notes (Signed)
Subjective: Chief Complaint  Patient presents with  . Rash    Rash on right arm and bump on  right ankle    Dr. Carolin Sicks, [facility] Dr. Quay Burow, cardiology Dr. Basil Dess, Orthopedics Eye doctor and dentist Mortimer Bair, Camelia Eng, PA-C here for primary care  Here with husband today.  Here for rash on right ankle and right arm x several days this past week.  Using salve and essential oils   Rash is mostly gone now but still itchy.  Rash was darker than skin tone.   Denied any blisters, no open wounds.   No drainage.  Other then touching arm against shower wall, denies any other exposure.  No other rash on body besides right arm and right ankle.  Recently cut out fried foods and cut out some meats.  Started eating some vegetable soup and raw salads.    wonders if this is causing the rash.  No other aggravating or relieving factors. No other complaint.  HTN - compliant with Coreg, just recently increased to 6.$RemoveBefore'25mg'dIEEJSNIkrdhi$ , 2 tablets BID or 12.$RemoveB'5mg'vbLJcDgW$  BID since recent ED visit for hypertensive urgency.  She continues on Losartan $RemoveBef'100mg'nKZZbQImRu$ .  Home readings are in the 140/80-160s  Diabetes - has upcoming establish care with endocrinology visit with Dr. Kelton Pillar in February.  Levemir 38u nightly, Novolog about 16u each meal per sliding scale.   In recent days sugars were down to 54.  So has been cutting back on novolog.      Past Medical History:  Diagnosis Date  . Anemia   . Arthritis    knees  . Bronchitis   . Diabetes mellitus without complication [facility])    age 55yo  . GERD (gastroesophageal reflux disease)   . Glaucoma    Dr. Einar Gip  . H/O mammogram 2005  . Headache   . Heart murmur   . Hyperlipidemia   . Hypertension   . Intermittent palpitations   . Legally blind   . Myopia   . Pneumonia   . Routine gynecological examination    last pap 2005  . Seasonal allergies   . Shortness of breath dyspnea   . Sinusitis   . Thyroid nodule    Current Outpatient Medications on File  Prior to Visit  Medication Sig Dispense Refill  . Alcohol Swabs (B-D SINGLE USE SWABS REGULAR) PADS USE  WHEN  TESTING BLOOD SUGAR 300 each 0  . aspirin EC 81 MG tablet Take 1 tablet (81 mg total) by mouth daily. 90 tablet 3  . Blood Glucose Monitoring Suppl (PRODIGY AUTOCODE BLOOD GLUCOSE) w/Device KIT     . carvedilol (COREG) 6.25 MG tablet Take 1 tablet (6.25 mg total) by mouth 2 (two) times daily. 60 tablet 0  . Cholecalciferol 50 MCG (2000 UT) TABS Take by mouth.    . dorzolamide-timolol (COSOPT) 22.3-6.8 MG/ML ophthalmic solution Place 1 drop into both eyes 2 times daily.    [name] glucose blood (PRODIGY NO CODING BLOOD GLUC) test strip Use as instructed 100 each 12  . Insulin Pen Needle (NOVOFINE PLUS) 32G X 4 MM MISC Use 4 times a day with Lantus Insulin 1000 each 0  . latanoprost (XALATAN) 0.005 % ophthalmic solution Place 1 drop into both eyes at bedtime.    [name] LEVEMIR FLEXTOUCH 100 UNIT/ML FlexPen INJECT 40 UNITS SUBCUTANEOUSLY AT BEDTIME (Patient taking differently: Inject 38 Units into the skin at bedtime.) 15 mL 0  . losartan (COZAAR) 100 MG tablet Take 1 tablet (100 mg total) by  mouth daily. 90 tablet 3  . NOVOLOG FLEXPEN 100 UNIT/ML FlexPen INJECT 20 UNITS SUBCUTANEOUSLY THREE TIMES DAILY WITH MEALS (USE SLIDING SCALE PROVIDED BY MD)(PEN EXPIRES AT 28 DAYS) (Patient taking differently: Inject 16 Units into the skin. INJECT 20 UNITS SUBCUTANEOUSLY THREE TIMES DAILY WITH MEALS (USE SLIDING SCALE PROVIDED BY MD)(PEN EXPIRES AT 28 DAYS)) 60 mL 5  . PRODIGY NO CODING BLOOD GLUC test strip TEST BLOOD SUGAR THREE TIMES DAILY 300 strip 0  . Prodigy Twist Top Lancets 28G MISC USE TO TEST BLOOD SUGAR THREE TIMES DAILY 300 each 0  . meclizine (ANTIVERT) 25 MG tablet Take 1 tablet (25 mg total) by mouth 3 (three) times daily as needed for dizziness. (Patient not taking: Reported on 06/13/2020) 30 tablet 0   No current facility-administered medications on file prior to visit.   ROS as in  subjective    Objective: BP (!) 142/88   Pulse 84   Ht $R'5\' 1"'mv$  (1.549 m)   Wt 153 lb (69.4 kg)   LMP 09/13/2015   SpO2 98%   BMI 28.91 kg/m    BP Readings from Last 3 Encounters:  06/13/20 (!) 142/88  06/03/20 (!) 154/69  05/30/20 128/72   Gen: wd, wn, nad Faint papular rash on right forearm mostly resolved.  No obvious rash on right ankle     Assessment: Encounter Diagnoses  Name Primary?  . Rash Yes  . Essential hypertension   . Uncontrolled diabetes mellitus with complication, with long-term current use of insulin ([place])   . CKD (chronic kidney disease) stage 2, GFR 60-89 ml/min      Plan: Rash mostly resolved, likely related to recent food changes.  C/t essential oils and salve she is sign that is helping  HTN - recently increased to coreg 12.$RemoveBef'5mg'zCENfhLLhT$  BID (2 tablets of 6.$RemoveBefo'25mg'xiJTHZrJWho$  BID, and she continues on losartan $RemoveBef'100mg'FMYyPayNXM$ .  Advised she f/u with nephrology who manages her BP  CKD - c/t routine f/u with nephrology  diabetes - has upcoming establish care visit with endocrinology.  Avoid hypoglycemia.  Of note she has been difficult to manage in the past with BP and sugars given noncompliance and personal beliefs on certain medicaiton or treatments.     Tina Horton was seen today for rash.  Diagnoses and all orders for this visit:  Rash  Essential hypertension  Uncontrolled diabetes mellitus with complication, with long-term current use of insulin (HCC)  CKD (chronic kidney disease) stage 2, GFR 60-89 ml/min   F/u with endocrinology and nephrology soon

## 2020-06-14 ENCOUNTER — Other Ambulatory Visit: Payer: Self-pay | Admitting: Medical

## 2020-06-22 ENCOUNTER — Ambulatory Visit: Payer: Medicare HMO | Admitting: Specialist

## 2020-06-30 ENCOUNTER — Encounter: Payer: Medicare HMO | Admitting: Medical

## 2020-07-10 DIAGNOSIS — H401134 Primary open-angle glaucoma, bilateral, indeterminate stage: Secondary | ICD-10-CM | POA: Diagnosis not present

## 2020-07-10 DIAGNOSIS — H5213 Myopia, bilateral: Secondary | ICD-10-CM | POA: Diagnosis not present

## 2020-07-10 DIAGNOSIS — H15833 Staphyloma posticum, bilateral: Secondary | ICD-10-CM | POA: Diagnosis not present

## 2020-07-10 DIAGNOSIS — H52209 Unspecified astigmatism, unspecified eye: Secondary | ICD-10-CM | POA: Diagnosis not present

## 2020-07-10 DIAGNOSIS — E119 Type 2 diabetes mellitus without complications: Secondary | ICD-10-CM | POA: Diagnosis not present

## 2020-07-10 DIAGNOSIS — Z794 Long term (current) use of insulin: Secondary | ICD-10-CM | POA: Diagnosis not present

## 2020-07-10 DIAGNOSIS — H18723 Corneal staphyloma, bilateral: Secondary | ICD-10-CM | POA: Diagnosis not present

## 2020-07-24 ENCOUNTER — Ambulatory Visit (INDEPENDENT_AMBULATORY_CARE_PROVIDER_SITE_OTHER): Payer: Medicare HMO | Admitting: Medical

## 2020-07-24 ENCOUNTER — Encounter: Payer: Self-pay | Admitting: Medical

## 2020-07-24 ENCOUNTER — Other Ambulatory Visit: Payer: Self-pay

## 2020-07-24 ENCOUNTER — Telehealth: Payer: Self-pay | Admitting: Cardiovascular Disease

## 2020-07-24 VITALS — BP 192/92 | HR 105 | Ht 61.0 in | Wt 141.2 lb

## 2020-07-24 DIAGNOSIS — E1165 Type 2 diabetes mellitus with hyperglycemia: Secondary | ICD-10-CM | POA: Diagnosis not present

## 2020-07-24 DIAGNOSIS — Z9119 Patient's noncompliance with other medical treatment and regimen: Secondary | ICD-10-CM

## 2020-07-24 DIAGNOSIS — E785 Hyperlipidemia, unspecified: Secondary | ICD-10-CM

## 2020-07-24 DIAGNOSIS — R002 Palpitations: Secondary | ICD-10-CM | POA: Diagnosis not present

## 2020-07-24 DIAGNOSIS — E118 Type 2 diabetes mellitus with unspecified complications: Secondary | ICD-10-CM

## 2020-07-24 DIAGNOSIS — Z91199 Patient's noncompliance with other medical treatment and regimen due to unspecified reason: Secondary | ICD-10-CM

## 2020-07-24 DIAGNOSIS — IMO0002 Reserved for concepts with insufficient information to code with codable children: Secondary | ICD-10-CM

## 2020-07-24 DIAGNOSIS — I10 Essential (primary) hypertension: Secondary | ICD-10-CM | POA: Diagnosis not present

## 2020-07-24 DIAGNOSIS — Z794 Long term (current) use of insulin: Secondary | ICD-10-CM

## 2020-07-24 DIAGNOSIS — N182 Chronic kidney disease, stage 2 (mild): Secondary | ICD-10-CM | POA: Diagnosis not present

## 2020-07-24 NOTE — Progress Notes (Signed)
Subjective:  Tina Horton is a 55 y.o. female who presents for Chief Complaint  Patient presents with  . Medication Management    Pt feels heart is racing, has not taken blood pressure meds since Saturday. Has not taken diabetes meds in 3 weeks. She is currently doing a plant base diet. Wants A1C checked.      Here for med check.   Medical team: Lonie Peak, ophthalmology Dr. Quay Burow, cardiology Dr. Basil Dess, orthopedics Dr. Noel Journey, nephrology Dr. Bo Merino, rheumatology Dr. Narda Amber, neurology Dr. Raynelle Bring, urology  Tina Horton has hx/o diabetes, hypertension, noncompliance, glaucoma, GERD, anemia, allergies, hyperlipidemia.    Diabetes - She reports not having taken her diabetes medicaiton in 3 weeks.  Wants HgbA1C checked.   Currently doing a plant based diet.  Has lost some weight intentionally.  Was on Novolog and Levemir.  She notes her sugars were getting in to the 50s so she stopped both insulins.  Of note, we had referred her to diabetes specialist last year, and she kept cancelling appointments due to covid, both covid infection and also due to covid risks in community. She still hasn't seen the diabetes specialist.   Most recently she was using 38 units, and was using 16u meal time novolog.  She says she gradually weaned off her insulin on her own, but kept seeing lower readings.  She says she her body is producing plenty of insulin.  Of note, 14 day and 30 day average on her glucometer shows 196.  (As she toggle through her glucometer, I saw no readings under 125)   Hypertension - She hasn't taken her BP medicaiton since Saturday 2 days ago.  Current medicaiton include Coreg 6.$RemoveBeforeD'25mg'hZbMCjvvxsEXXZ$  BID, Losartan $RemoveBefor'100mg'AMLbVPmXlPkh$  daily. She notes she quit coreg as she feels like her palpitations are worse on coreg.      Heart has been racing.  using cheyenne pepper and hot water and heart racing stops.   hyperlipidemia - she declines cholesterol medication due to  handling things with her diet.   Not eating meat or cheese.     She states she will not need insulin in the future as she will reverse her diabetes on her own.  She feels that all medications are chemicals that she doesn't need in her body.  She feels wholeheartedly that her hypertension and diabetes will resolve without medication  Currently doing a 2 week OTC cleanse.  No other aggravating or relieving factors.    No other c/o.  Past Medical History:  Diagnosis Date  . Anemia   . Arthritis    knees  . Bronchitis   . Diabetes mellitus without complication [facility])    age 53yo  . GERD (gastroesophageal reflux disease)   . Glaucoma    Dr. Einar Gip  . H/O mammogram 2005  . Headache   . Heart murmur   . Hyperlipidemia   . Hypertension   . Intermittent palpitations   . Legally blind   . Myopia   . Pneumonia   . Routine gynecological examination    last pap 2005  . Seasonal allergies   . Shortness of breath dyspnea   . Sinusitis   . Thyroid nodule     Current Outpatient Medications on File Prior to Visit  Medication Sig Dispense Refill  . Alcohol Swabs (B-D SINGLE USE SWABS REGULAR) PADS USE  WHEN  TESTING BLOOD SUGAR 300 each 0  . aspirin EC 81 MG tablet Take 1 tablet (81 mg  total) by mouth daily. 90 tablet 3  . Blood Glucose Monitoring Suppl (PRODIGY AUTOCODE BLOOD GLUCOSE) w/Device KIT     . carvedilol (COREG) 6.25 MG tablet Take 1 tablet (6.25 mg total) by mouth 2 (two) times daily. 60 tablet 0  . Cholecalciferol 50 MCG (2000 UT) TABS Take by mouth.    . dorzolamide-timolol (COSOPT) 22.3-6.8 MG/ML ophthalmic solution Place 1 drop into both eyes 2 times daily.    [name] glucose blood (PRODIGY NO CODING BLOOD GLUC) test strip Use as instructed 100 each 12  . latanoprost (XALATAN) 0.005 % ophthalmic solution Place 1 drop into both eyes at bedtime.    [name] losartan (COZAAR) 100 MG tablet Take 1 tablet (100 mg total) by mouth daily. 90 tablet 3  . PRODIGY NO CODING BLOOD GLUC test  strip TEST BLOOD SUGAR THREE TIMES DAILY 300 strip 0  . Prodigy Twist Top Lancets 28G MISC USE TO TEST BLOOD SUGAR THREE TIMES DAILY 300 each 0  . Insulin Pen Needle (NOVOFINE PLUS) 32G X 4 MM MISC Use 4 times a day with Lantus Insulin (Patient not taking: Reported on 07/24/2020) 1000 each 0  . LEVEMIR FLEXTOUCH 100 UNIT/ML FlexPen INJECT 40 UNITS SUBCUTANEOUSLY AT BEDTIME (Patient not taking: Reported on 07/24/2020) 15 mL 0  . meclizine (ANTIVERT) 25 MG tablet Take 1 tablet (25 mg total) by mouth 3 (three) times daily as needed for dizziness. (Patient not taking: No sig reported) 30 tablet 0  . NOVOLOG FLEXPEN 100 UNIT/ML FlexPen INJECT 20 UNITS SUBCUTANEOUSLY THREE TIMES DAILY WITH MEALS (USE SLIDING SCALE PROVIDED BY MD)(PEN EXPIRES AT 28 DAYS) (Patient not taking: Reported on 07/24/2020) 60 mL 5   No current facility-administered medications on file prior to visit.    The following portions of the patient's history were reviewed and updated as appropriate: allergies, current medications, past family history, past medical history, past social history, past surgical history and problem list.  ROS Otherwise as in subjective above     Objective: BP (!) 192/92   Pulse (!) 105   Ht $R'5\' 1"'vG$  (1.549 m)   Wt 141 lb 3.2 oz (64 kg)   LMP 09/13/2015   SpO2 97%   BMI 26.68 kg/m   General appearance: alert, no distress, well developed, well nourished otherwise not examined A&O x3, answers questions appropriate    Assessment: Encounter Diagnoses  Name Primary?  [name] Uncontrolled diabetes mellitus with complication, with long-term current use of insulin ([place]) Yes  . Essential hypertension   . CKD (chronic kidney disease) stage 2, GFR 60-89 ml/min   . Noncompliance   . Palpitation   . Hyperlipidemia, unspecified hyperlipidemia type      Plan: We spent a good deal of time today talking about her concerns, medications, diet.  We discussed her longstanding chronic issues.  Unfortunately I do not  believe we have the same perspective in terms of treatment, understanding of disease process, understanding of complications, or on the same page will each other in terms of how we move forward.  I expressed to Tina Horton and her husband (who is also my patient), that if she does not trust my care or another provider's care, or if we can't even agree on these basic ideas above about the extent of her chronic issues, possible complications of these disease processes, or common establish treatment guidelines, then it may be difficult moving forward in this patient/ provider relationship.    Uncontrolled diabetes-in the last few years it has not been uncommon  for her to stop the medication abruptly without letting anybody know.  She wholeheartedly believes today that if she exercises more and continues on her current plant-based diet that will she will reverse her diabetes and not have to be on any medication.  We did agree that recently when she saw low glucose readings even as low as in the 50 range, that this is not acceptable.  She does not have a whole list of glucose readings today.  She showed me a few readings on her glucometer, none of which were less than 124.  Her 14-day average was 196, her 30-day average was also 196.  She had been on Levemir 38 units daily and NovoLog mealtime 16 units with meals.  She abruptly stopped all of this over the last week.  I recommended she get back on 20 units of Levemir for now but she declines.  I recommended she check her sugars 3 times a day fasting and before meals to get a good variety of sugar readings to take with her to see her endocrinologist when she establishes care soon.  We initially referred her to endocrinology July 2021 when it became obvious we were getting nowhere with improving her glucose control.  She apparently has canceled or rescheduled this appointment numerous times.  We will call today to try to get her in with endocrinology ASAP.  I do not  feel comfortable managing her diabetes and have stated this to her he will last year when we made the referral.  We discussed that many things can factor into glucose readings including what types of foods and quantities she is eating, time of day, exercise, or conversely if she skips meals or fast this could also alter her sugars.  We discussed some differences between type I and type 2 diabetes briefly and discussed that if a certain amount of damage has been done to the pancreas, the disease process would not be reversible.  We discussed the diabetes of progressively worsening disease.      Hypertension-she recently abruptly stopped her blood pressure medicines.  She has done this in the past as well without letting anybody know.  I will defer treatment to her cardiologist.  I sent him a message today.  In the past when I have prescribed medicines she sometimes either does not take them or comes back at me with reasons why these may harm her, although they are routine commonplace medications for blood pressure.    Palpitations, racing beats of heart-advise she limit caffeine, she does not drink alcohol.  I advise she get back in with cardiology now to assess the situation.  I sent a message to her cardiologist to get her in for this reason and the blood pressure issues including noncompliance  Hyperlipidemia-she declines statins or other cholesterol-lowering medicine at this time.  CKD-I reviewed her nephrology notes from January 2022.  I also discussed with Tina Horton that contrary to her perception that her kidneys are fine now, she in fact does have chronic kidney disease.  We discussed her January kidney doctor visit and recommendations.  I reiterated that maintaining good kidney function involves maintaining good blood sugar control good blood pressure control.    I discussed her decision to stop her medicines on her own, the lack of trust she has in my recommendations at times, and we discussed  that her perception of her chronic disease and my perception are not 1 in the same.  It is becoming increasingly difficult to  help manage her chronic issues just from the standpoint of believe systems and self perception, not including the fact that she is legally blind and has limitations in what she can do administering her own medicines or treatment.  Fortunately her husband does look after her and help, but a lot of the issues we are discussing today are her personal belief issues not her physical limitations  We discussed our role as PCP vs what her specialist see her for.    CC to nephrology, already reached out to Dr. Gwenlyn Found cardiology today and Dr. Wellington Hampshire was seen today for medication management.  Diagnoses and all orders for this visit:  Uncontrolled diabetes mellitus with complication, with long-term current use of insulin (HCC) -     Insulin and C-Peptide -     Hemoglobin A1c -     Comprehensive metabolic panel -     Ambulatory referral to Endocrinology  Essential hypertension -     Comprehensive metabolic panel  CKD (chronic kidney disease) stage 2, GFR 60-89 ml/min -     Comprehensive metabolic panel  Noncompliance  Palpitation  Hyperlipidemia, unspecified hyperlipidemia type -     Lipid panel    Follow up: pending labs, referral

## 2020-07-24 NOTE — Patient Instructions (Addendum)
Recommendations:  Heart racing/palpitations and uncontrolled high blood pressure  I will send a message to Dr. Gwenlyn Found today about these issues so he can get you for evaluation, heart monitor  Diabetes   We are going to try to help you get in to see the diabetes specialist ASAP.  I would like you to write down your sugar reading /record your sugar readings 3 times daily including fasting in the morning, prior to lunch, and prior to dinner.  Takes these readings to the appointment when you see the diabetes specialist soon  Please make sure you attend this visit with endocrinology  Chronic Kidney disease   Continue routine follow up with [facility]

## 2020-07-24 NOTE — Telephone Encounter (Signed)
Patient c/o Palpitations:  High priority if patient c/o lightheadedness, shortness of breath, or chest pain  1) How long have you had palpitations/irregular HR/ Afib? Are you having the symptoms now? Palpitations started Saturday 03/05. Not right now but they are coming and going.   2) Are you currently experiencing lightheadedness, SOB or CP? no  3) Do you have a history of afib (atrial fibrillation) or irregular heart rhythm? Palpitations  4) Have you checked your BP or HR? (document readings if available): unsure  5) Are you experiencing any other symptoms? Her balance is a little off.   Patient states that she her palpitations started after she ate salmon on 03/05. She is not sure if it was because of the sodium or not. She would like to be seen sooner than the appt she has on 03/09 with Dr. Gwenlyn Found. She states that this worries her and she is home alone while her husband works.

## 2020-07-24 NOTE — Telephone Encounter (Signed)
Called pt regarding palpitations and heart racing. Pt states that ever since September of 2021 when she had covid she has been dealing with a number of issues.  Pt states that she had to go to the ED two separate times because of blood pressure spikes since having covid. Pt states that since then she has seen her PCP and Dr. Gwenlyn Found and they both believe that these issues started because of covid.  Pt states that she has been having palpitations since then and has been making numerous diet modifications to help prevent these palpations. Pt states that she has eliminated caffeine from her diet and has also been reducing sodium as much as possible. Pt states that on Saturday, March 5th that she felt fine but had a meal that included salmon and that's when she began to have issues with heart racing. Pt states her blood pressure was 203/120 at this time and she went ahead a took her blood pressure medication at this time with no relief. Pt then tried a mixture of cayenne pepper and water and this seemed to help. Pt states that on Sunday she was out and about walking all day with no issues, it was when she was getting ready for bed that night that her heart started racing again like she has "just worked out". Pt did see her PCP today, Dr. Glade Lloyd who placed orders for blood work. Pt blood pressure today at her appointment was 192/92.  Pt also states that her normal blood pressure range is 160-170s/90s.  Pt believes that this is all related to sodium but isnt sure and would like to figure out why she is having these episodes.  Pt has an appointment to see Dr. Gwenlyn Found on Wednesday, 3/9. Advised pt to take it easy for the next 2 days and to make sure to stay well hydrated.  Pt verbalize understanding and will keep her appointment.

## 2020-07-25 ENCOUNTER — Other Ambulatory Visit: Payer: Self-pay

## 2020-07-25 ENCOUNTER — Emergency Department (HOSPITAL_COMMUNITY): Payer: Medicare HMO

## 2020-07-25 ENCOUNTER — Encounter (HOSPITAL_COMMUNITY): Payer: Self-pay

## 2020-07-25 ENCOUNTER — Emergency Department (HOSPITAL_COMMUNITY)
Admission: EM | Admit: 2020-07-25 | Discharge: 2020-07-26 | Disposition: A | Discharge status: Home / Self Care | Payer: Medicare HMO | Attending: Emergency Medicine | Admitting: Emergency Medicine

## 2020-07-25 DIAGNOSIS — Z794 Long term (current) use of insulin: Secondary | ICD-10-CM | POA: Insufficient documentation

## 2020-07-25 DIAGNOSIS — R000 Tachycardia, unspecified: Secondary | ICD-10-CM | POA: Diagnosis not present

## 2020-07-25 DIAGNOSIS — I129 Hypertensive chronic kidney disease with stage 1 through stage 4 chronic kidney disease, or unspecified chronic kidney disease: Secondary | ICD-10-CM | POA: Insufficient documentation

## 2020-07-25 DIAGNOSIS — Z8616 Personal history of COVID-19: Secondary | ICD-10-CM | POA: Insufficient documentation

## 2020-07-25 DIAGNOSIS — R002 Palpitations: Secondary | ICD-10-CM | POA: Diagnosis not present

## 2020-07-25 DIAGNOSIS — E1122 Type 2 diabetes mellitus with diabetic chronic kidney disease: Secondary | ICD-10-CM | POA: Diagnosis not present

## 2020-07-25 DIAGNOSIS — Z7982 Long term (current) use of aspirin: Secondary | ICD-10-CM | POA: Diagnosis not present

## 2020-07-25 DIAGNOSIS — R079 Chest pain, unspecified: Secondary | ICD-10-CM | POA: Diagnosis not present

## 2020-07-25 DIAGNOSIS — Z79899 Other long term (current) drug therapy: Secondary | ICD-10-CM | POA: Diagnosis not present

## 2020-07-25 DIAGNOSIS — I10 Essential (primary) hypertension: Secondary | ICD-10-CM | POA: Diagnosis not present

## 2020-07-25 DIAGNOSIS — N182 Chronic kidney disease, stage 2 (mild): Secondary | ICD-10-CM | POA: Insufficient documentation

## 2020-07-25 LAB — HEMOGLOBIN A1C
Est. average glucose Bld gHb Est-mCnc: 197 mg/dL
Hgb A1c MFr Bld: 8.5 % — ABNORMAL HIGH (ref 4.8–5.6)

## 2020-07-25 LAB — COMPREHENSIVE METABOLIC PANEL
ALT: 18 IU/L (ref 0–32)
AST: 19 IU/L (ref 0–40)
Albumin/Globulin Ratio: 1.8 (ref 1.2–2.2)
Albumin: 4.9 g/dL (ref 3.8–4.9)
Alkaline Phosphatase: 86 IU/L (ref 44–121)
BUN/Creatinine Ratio: 15 (ref 9–23)
BUN: 14 mg/dL (ref 6–24)
Bilirubin Total: 1 mg/dL (ref 0.0–1.2)
CO2: 21 mmol/L (ref 20–29)
Calcium: 10.3 mg/dL — ABNORMAL HIGH (ref 8.7–10.2)
Chloride: 99 mmol/L (ref 96–106)
Creatinine, Ser: 0.92 mg/dL (ref 0.57–1.00)
Globulin, Total: 2.7 g/dL (ref 1.5–4.5)
Glucose: 150 mg/dL — ABNORMAL HIGH (ref 65–99)
Potassium: 4.1 mmol/L (ref 3.5–5.2)
Sodium: 141 mmol/L (ref 134–144)
Total Protein: 7.6 g/dL (ref 6.0–8.5)
eGFR: 74 mL/min/{1.73_m2} (ref >59)

## 2020-07-25 LAB — INSULIN AND C-PEPTIDE, SERUM
C-Peptide: 2.1 ng/mL (ref 1.1–4.4)
INSULIN: 14.2 u[IU]/mL (ref 2.6–24.9)

## 2020-07-25 LAB — LIPID PANEL
Chol/HDL Ratio: 4.8 ratio — ABNORMAL HIGH (ref 0.0–4.4)
Cholesterol, Total: 228 mg/dL — ABNORMAL HIGH (ref 100–199)
HDL: 48 mg/dL (ref >39)
LDL Chol Calc (NIH): 165 mg/dL — ABNORMAL HIGH (ref 0–99)
Triglycerides: 83 mg/dL (ref 0–149)
VLDL Cholesterol Cal: 15 mg/dL (ref 5–40)

## 2020-07-25 LAB — CBC
HCT: 38.3 % (ref 36.0–46.0)
Hemoglobin: 13.1 g/dL (ref 12.0–15.0)
MCH: 29.4 pg (ref 26.0–34.0)
MCHC: 34.2 g/dL (ref 30.0–36.0)
MCV: 85.9 fL (ref 80.0–100.0)
Platelets: 272 10*3/uL (ref 150–400)
RBC: 4.46 MIL/uL (ref 3.87–5.11)
RDW: 12.4 % (ref 11.5–15.5)
WBC: 6.1 10*3/uL (ref 4.0–10.5)
nRBC: 0 % (ref 0.0–0.2)

## 2020-07-25 NOTE — Progress Notes (Signed)
Patient has been referred to [facility] Endocrinology as Tina Horton Endo would no longer see her since she canceled 10 new patient appointments.

## 2020-07-25 NOTE — Telephone Encounter (Addendum)
Spoke to patient . Informed patient to keep her appointment for tomorrow . No available appointment today . Patient saw her primary yesterday .  Suggest patient check pulse rate  today periodically and bring reading to appointment.  Do not check blood pressure every hour on the hour. If symptoms becomes worse go to ER for evaluation.  Patient verbalized understanding.

## 2020-07-25 NOTE — ED Triage Notes (Signed)
Patient BIB [facility] for palpitations, states they have been intermittent, states HR 116 when she called EMS, states she thinks her BP meds are causing it. EMS reports she has an appt with cardiologist in the morning, has not been taking her BP meds since Saturday due to palpitations

## 2020-07-25 NOTE — Telephone Encounter (Signed)
Patient c/o Palpitations:  High priority if patient c/o lightheadedness, shortness of breath, or chest pain  1) How long have you had palpitations/irregular HR/ Afib? Are you having the symptoms now? Started Saturday, but has worsened today   2) Are you currently experiencing lightheadedness, SOB or CP? No   3) Do you have a history of afib (atrial fibrillation) or irregular heart rhythm? No   4) Have you checked your BP or HR? (document readings if available): No  5) Are you experiencing any other symptoms? Off balance   Markitta is calling wanting to be worked in for today instead of waiting until tomorrow for her appointment. She states she had a episode of palpitations the entire time while in the shower today. She states after showering she drank a mixture of water, tumeric, cayenn pepper, black pepper, and cinnamon and the episode passed. Please advise.

## 2020-07-26 ENCOUNTER — Encounter: Payer: Self-pay | Admitting: Radiology

## 2020-07-26 ENCOUNTER — Ambulatory Visit (INDEPENDENT_AMBULATORY_CARE_PROVIDER_SITE_OTHER): Payer: Medicare HMO

## 2020-07-26 ENCOUNTER — Ambulatory Visit: Payer: Medicare HMO | Admitting: Cardiovascular Disease

## 2020-07-26 ENCOUNTER — Encounter: Payer: Self-pay | Admitting: Cardiovascular Disease

## 2020-07-26 DIAGNOSIS — E782 Mixed hyperlipidemia: Secondary | ICD-10-CM | POA: Diagnosis not present

## 2020-07-26 DIAGNOSIS — R002 Palpitations: Secondary | ICD-10-CM

## 2020-07-26 DIAGNOSIS — I10 Essential (primary) hypertension: Secondary | ICD-10-CM

## 2020-07-26 LAB — BASIC METABOLIC PANEL
Anion gap: 14 (ref 5–15)
BUN: 9 mg/dL (ref 6–20)
CO2: 23 mmol/L (ref 22–32)
Calcium: 9.8 mg/dL (ref 8.9–10.3)
Chloride: 100 mmol/L (ref 98–111)
Creatinine, Ser: 0.86 mg/dL (ref 0.44–1.00)
GFR, Estimated: >60 mL/min (ref >60)
Glucose, Bld: 156 mg/dL — ABNORMAL HIGH (ref 70–99)
Potassium: 3.1 mmol/L — ABNORMAL LOW (ref 3.5–5.1)
Sodium: 137 mmol/L (ref 135–145)

## 2020-07-26 LAB — TROPONIN I (HIGH SENSITIVITY)
Troponin I (High Sensitivity): 16 ng/L (ref <18)
Troponin I (High Sensitivity): 19 ng/L — ABNORMAL HIGH (ref <18)
Troponin I (High Sensitivity): 16 ng/L (ref <18)

## 2020-07-26 LAB — CBG MONITORING, ED: Glucose-Capillary: 151 mg/dL — ABNORMAL HIGH (ref 70–99)

## 2020-07-26 MED ORDER — CARVEDILOL 25 MG PO TABS: 25.0000 mg | ORAL_TABLET | Freq: Two times a day (BID) | ORAL | 3 refills | Status: DC

## 2020-07-26 MED ORDER — ATORVASTATIN CALCIUM 40 MG PO TABS: 40.0000 mg | ORAL_TABLET | Freq: Every day | ORAL | 3 refills | Status: DC

## 2020-07-26 MED ORDER — CARVEDILOL 12.5 MG PO TABS
6.2500 mg | ORAL_TABLET | Freq: Two times a day (BID) | ORAL | Status: DC
  Administered 2020-07-26: 6.25 mg via ORAL
  Filled 2020-07-26: qty 1

## 2020-07-26 MED ORDER — LOSARTAN POTASSIUM 50 MG PO TABS
100.0000 mg | ORAL_TABLET | Freq: Every day | ORAL | Status: DC
  Administered 2020-07-26: 100 mg via ORAL
  Filled 2020-07-26: qty 2

## 2020-07-26 NOTE — Assessment & Plan Note (Signed)
History of hyperlipidemia lipid profile performed 07/24/2020 revealing total cholesterol 228, LDL of 165 and HDL 48.  And then to begin her on atorvastatin 40 mg a day and will recheck a lipid liver profile in 2 months.

## 2020-07-26 NOTE — ED Provider Notes (Signed)
[facility] EMERGENCY DEPARTMENT Provider Note   CSN: [id number] Arrival date & time: 07/25/20  2246     History Chief Complaint  Patient presents with  . Palpitations    Tina Horton is a 55 y.o. female.  55 year old female with a history of hypertension, dyslipidemia, diabetes, esophageal reflux presents to the emergency department for evaluation of palpitations.  She reports palpitations intermittently since Saturday.  Characterizes her palpitations as her heart either beating strongly or very fast.  She felt that her heart was beating more rapidly prior to her presentation tonight.  When her palpitations began on Saturday, she discontinued her blood pressure medications as she was unsure if this was causing her symptoms.  She denies associated fever, chest pain, SOB, syncope, leg swelling, N/V, hemoptysis.  No recent stimulant use or medication changes.  Has f/u with her cardiologist scheduled for the AM.  The history is provided by the patient. No language interpreter was used.  Palpitations      Past Medical History:  Diagnosis Date  . Anemia   . Arthritis    knees  . Bronchitis   . Diabetes mellitus without complication [facility])    age 33yo  . GERD (gastroesophageal reflux disease)   . Glaucoma    Dr. Einar Gip  . H/O mammogram 2005  . Headache   . Heart murmur   . Hyperlipidemia   . Hypertension   . Intermittent palpitations   . Legally blind   . Myopia   . Pneumonia   . Routine gynecological examination    last pap 2005  . Seasonal allergies   . Shortness of breath dyspnea   . Sinusitis   . Thyroid nodule     Patient Active Problem List   Diagnosis Date Noted  . Palpitation 07/24/2020  . History of COVID-19 05/22/2020  . COVID-19 virus infection 02/25/2020  . Sinus pressure 02/25/2020  . Acute non-recurrent frontal sinusitis 02/25/2020  . Chronic pain of both knees 01/04/2020  . 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 CKD (chronic  kidney disease) stage 2, GFR 60-89 ml/min 11/29/2019  . 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 Vitamin D deficiency 03/13/2018  . Primary osteoarthritis of both knees 01/20/2018  . DDD (degenerative disc disease), lumbar 01/20/2018  . Localized swelling, mass or lump of neck 10/30/2017  . Microalbuminuria 08/26/2017  . Mass of neck 08/26/2017  . Screening for cancer 08/20/2017  . Left leg pain 08/20/2017  . Respiratory tract infection 08/20/2017  . Nausea 08/20/2017  . Renal cyst 01/15/2016  . Hydronephrosis of left kidney 01/15/2016  . Spinal stenosis 01/15/2016  . History of burning pain in leg 12/04/2015  . S/P laparoscopic assisted vaginal hysterectomy (LAVH) 09/13/2015  . Encounter for health maintenance examination in adult 08/21/2015  . Hyperlipidemia 08/21/2015  . Glaucoma 08/21/2015  . Heart murmur 08/21/2015  . Thyroid nodule 08/21/2015  . Vaccine refused by patient 08/21/2015  . Noncompliance 03/21/2015  . Essential hypertension 03/21/2015  . Uncontrolled diabetes mellitus with complication, with long-term current use of insulin ([place]) 03/21/2015  . Lymphadenitis 03/21/2015  . SKIN LESION 10/19/2008  . SYSTOLIC MURMUR 41/66/0630  . GLAUCOMA 07/17/2006  . VISUAL DISTURBANCE NOS 07/17/2006  . REFLUX ESOPHAGITIS 07/17/2006    Past Surgical History:  Procedure Laterality Date  . CYSTECTOMY     umbilicus  . INCISION AND DRAINAGE     abdominal superficial abscess  . LAPAROSCOPIC VAGINAL HYSTERECTOMY WITH SALPINGECTOMY Bilateral 09/13/2015   Procedure: LAPAROSCOPIC ASSISTED VAGINAL HYSTERECTOMY  WITH SALPINGECTOMY, [place]s Colpoplasty;  Surgeon: Thurnell Lose, MD;  Location: [facility];  Service: Gynecology;  Laterality: Bilateral;  . LASIK     x2     OB History   No obstetric history on file.     Family History  Problem Relation Age of Onset  . Diabetes Mother   . Hypertension Mother   . Stroke Mother   . Hypertension Father   . Chronic Renal Failure Father   .  Hypertension Sister   . Hypertension Brother   . Stroke Maternal Grandmother   . Asthma Daughter   . Healthy Daughter   . Healthy Son   . Cancer Neg Hx   . Heart disease Neg Hx     Social History   Tobacco Use  . Smoking status: Never Smoker  . Smokeless tobacco: Never Used  Vaping Use  . Vaping Use: Never used  Substance Use Topics  . Alcohol use: No    Alcohol/week: 0.0 standard drinks  . Drug use: No    Home Medications Prior to Admission medications   Medication Sig Start Date End Date Taking? Authorizing Provider  Alcohol Swabs (B-D SINGLE USE SWABS REGULAR) PADS USE  WHEN  TESTING BLOOD SUGAR 06/02/20  Yes Tysinger, Camelia Eng, PA-C  aspirin EC 81 MG tablet Take 1 tablet (81 mg total) by mouth daily. 11/30/19  Yes Tysinger, Camelia Eng, PA-C  Blood Glucose Monitoring Suppl (PRODIGY AUTOCODE BLOOD GLUCOSE) w/Device KIT  12/07/19  Yes [provider]  carvedilol (COREG) 6.25 MG tablet Take 1 tablet (6.25 mg total) by mouth 2 (two) times daily. 05/19/20  Yes Pattricia Boss, MD  Cholecalciferol (DIALYVITE VITAMIN D 5000 PO) Take 5,000 Units by mouth 2 (two) times a week.   Yes [provider]  dorzolamide-timolol (COSOPT) 22.3-6.8 MG/ML ophthalmic solution Place 1 drop into both eyes 2 times daily. 12/23/19 12/22/20 Yes [provider]  glucose blood (PRODIGY NO CODING BLOOD GLUC) test strip Use as instructed 11/05/17  Yes Tysinger, Camelia Eng, PA-C  Insulin Pen Needle (NOVOFINE PLUS) 32G X 4 MM MISC Use 4 times a day with Lantus Insulin 12/24/19  Yes Tysinger, Camelia Eng, PA-C  latanoprost (XALATAN) 0.005 % ophthalmic solution Place 1 drop into both eyes at bedtime.   Yes [provider]  losartan (COZAAR) 100 MG tablet Take 1 tablet (100 mg total) by mouth daily. 12/07/19  Yes Tysinger, Camelia Eng, PA-C  PRODIGY NO CODING BLOOD GLUC test strip TEST BLOOD SUGAR THREE TIMES DAILY 05/26/20  Yes Tysinger, Camelia Eng, PA-C  Prodigy Twist Top Lancets 28G MISC USE TO TEST BLOOD  SUGAR THREE TIMES DAILY 05/26/20  Yes Tysinger, Camelia Eng, PA-C  LEVEMIR FLEXTOUCH 100 UNIT/ML FlexPen INJECT 40 UNITS SUBCUTANEOUSLY AT BEDTIME Patient not taking: No sig reported 06/14/20   Tysinger, Camelia Eng, PA-C  meclizine (ANTIVERT) 25 MG tablet Take 1 tablet (25 mg total) by mouth 3 (three) times daily as needed for dizziness. Patient not taking: No sig reported 06/03/20   Domenic Moras, PA-C  NOVOLOG FLEXPEN 100 UNIT/ML FlexPen INJECT 20 UNITS SUBCUTANEOUSLY THREE TIMES DAILY WITH MEALS (USE SLIDING SCALE PROVIDED BY MD)(PEN EXPIRES AT 28 DAYS) Patient not taking: No sig reported 03/31/20   Tysinger, Camelia Eng, PA-C    Allergies    Amlodipine, Lisinopril, Metformin and related, and Tanzeum [albiglutide]  Review of Systems   Review of Systems  Cardiovascular: Positive for palpitations.  Ten systems reviewed and are negative for acute change, except as noted in  the HPI.    Physical Exam Updated Vital Signs BP 120/75   Pulse 86   Temp 98.7 F (37.1 C) (Oral)   Resp 19   Ht $R'5\' 1"'om$  (1.549 m)   Wt 64 kg   LMP 09/13/2015   SpO2 97%   BMI 26.66 kg/m   Physical Exam Vitals and nursing note reviewed.  Constitutional:      General: She is not in acute distress.    Appearance: She is well-developed and well-nourished. She is not diaphoretic.     Comments: Nontoxic appearing and in NAD  HENT:     Head: Normocephalic and atraumatic.  Eyes:     General: No scleral icterus.    Extraocular Movements: EOM normal.     Conjunctiva/sclera: Conjunctivae normal.  Cardiovascular:     Rate and Rhythm: Regular rhythm. Tachycardia present.     Pulses: Normal pulses.     Comments: 105-112bpm Pulmonary:     Effort: Pulmonary effort is normal. No respiratory distress.     Breath sounds: No stridor. No wheezing.     Comments: Respirations even and unlabored Musculoskeletal:        General: Normal range of motion.     Cervical back: Normal range of motion.     Comments: No BLE edema.  Skin:     General: Skin is warm and dry.     Coloration: Skin is not pale.     Findings: No erythema or rash.  Neurological:     Mental Status: She is alert and oriented to person, place, and time.     Coordination: Coordination normal.  Psychiatric:        Mood and Affect: Mood and affect normal.        Behavior: Behavior normal.     ED Results / Procedures / Treatments   Labs (all labs ordered are listed, but only abnormal results are displayed) Labs Reviewed  BASIC METABOLIC PANEL - Abnormal; Notable for the following components:      Result Value   Potassium 3.1 (*)    Glucose, Bld 156 (*)    All other components within normal limits  CBG MONITORING, ED - Abnormal; Notable for the following components:   Glucose-Capillary 151 (*)    All other components within normal limits  TROPONIN I (HIGH SENSITIVITY) - Abnormal; Notable for the following components:   Troponin I (High Sensitivity) 19 (*)    All other components within normal limits  CBC  TROPONIN I (HIGH SENSITIVITY)  TROPONIN I (HIGH SENSITIVITY)    EKG EKG Interpretation  Date/Time:  Tuesday July 25 2020 22:51:41 EST Ventricular Rate:  110 PR Interval:  154 QRS Duration: 74 QT Interval:  348 QTC Calculation: 470 R Axis:   6 Text Interpretation: Sinus tachycardia Cannot rule out Anterior infarct , age undetermined Abnormal ECG When compared with ECG of 06/03/2020, HEART RATE has increased Arm lead reversal has been corrected Otherwise no significant change Reconfirmed by Addison Lank [phone]) on 07/26/2020 2:57:46 AM   Radiology DG Chest 2 View  Result Date: 07/25/2020 CLINICAL DATA:  Chest pain.  Palpitations. EXAM: CHEST - 2 VIEW COMPARISON:  05/19/2020 FINDINGS: The cardiomediastinal contours are normal. The lungs are clear. Pulmonary vasculature is normal. No consolidation, pleural effusion, or pneumothorax. No acute osseous abnormalities are seen. IMPRESSION: Negative radiographs of the chest. Electronically Signed    By: Keith Rake M.D.   On: 07/25/2020 23:18    Procedures Procedures   Medications Ordered in ED Medications  carvedilol (COREG) tablet 6.25 mg (6.25 mg Oral Given 07/26/20 0412)  losartan (COZAAR) tablet 100 mg (100 mg Oral Given 07/26/20 4163)    ED Course  I have reviewed the triage vital signs and the nursing notes.  Pertinent labs & imaging results that were available during my care of the patient were reviewed by me and considered in my medical decision making (see chart for details).    MDM Rules/Calculators/A&P                          55 year old female presents to the emergency department for palpitations.  Symptoms have been intermittent, persistent x4 days.  She stopped taking her blood pressure medication because of her palpitations as she thought these medications may have been the cause of her symptoms.  Hypertensive in the emergency department with tachycardia.  Her vital signs have normalized with the administration of her Coreg and Cozaar.  No associated chest pain, shortness of breath.  Had reassuring thyroid studies and a negative D-dimer 1.5 months ago.  Troponin in the ED today is stable.  Her EKG is without signs of acute ischemia.  She has follow-up with her cardiologist today.  Feel discharge is reasonable to continue with plan for outpatient follow-up.  Have discussed the importance of adherence to medication regimen.  Patient discharged in stable condition with no unaddressed concerns.   Final Clinical Impression(s) / ED Diagnoses Final diagnoses:  Palpitations    Rx / DC Orders ED Discharge Orders    None       Antonietta Breach, PA-C 07/26/20 0654    Fatima Blank, MD 07/26/20 [phone]

## 2020-07-26 NOTE — Patient Instructions (Signed)
Medication Instructions:   -Start atorvastatin (lipitor) 40mg  once daily.  -Increase carvedilol (coreg) to 25mg  twice daily.   *If you need a refill on your cardiac medications before your next appointment, please call your pharmacy*   Lab Work: Your physician recommends that you have labs drawn today: TSH/ Free T4  Your physician recommends that you return for lab work in: 3 months for lipid/liver profile.    If you have labs (blood work) drawn today and your tests are completely normal, you will receive your results only by: [name] MyChart Message (if you have MyChart) OR . A paper copy in the mail If you have any lab test that is abnormal or we need to change your treatment, we will call you to review the results.   Testing/Procedures: Your physician has requested that you have an echocardiogram. Echocardiography is a painless test that uses sound waves to create images of your heart. It provides your doctor with information about the size and shape of your heart and how well your heart's chambers and valves are working. This procedure takes approximately one hour. There are no restrictions for this procedure. This procedure is done at [address] [facility] Monitor Instructions   Your physician has requested you wear your ZIO patch monitor 14 days.   This is a single patch monitor.  [employer] supplies one patch monitor per enrollment.  Additional stickers are not available.   Please do not apply patch if you will be having a Nuclear Stress Test, Echocardiogram, Cardiac CT, MRI, or Chest Xray during the time frame you would be wearing the monitor. The patch cannot be worn during these tests.  You cannot remove and re-apply the ZIO XT patch monitor.   Your ZIO patch monitor will be sent USPS Priority mail from [facility] directly to your home address. The monitor may also be mailed to a PO BOX if home delivery is not available.   It may take 3-5 days to  receive your monitor after you have been enrolled.   Once you have received you monitor, please review enclosed instructions.  Your monitor has already been registered assigning a specific monitor serial # to you.   Applying the monitor   Shave hair from upper left chest.   Hold abrader disc by orange tab.  Rub abrader in 40 strokes over left upper chest as indicated in your monitor instructions.   Clean area with 4 enclosed alcohol pads .  Use all pads to assure are is cleaned thoroughly.  Let dry.   Apply patch as indicated in monitor instructions.  Patch will be place under collarbone on left side of chest with arrow pointing upward.   Rub patch adhesive wings for 2 minutes.Remove white label marked "1".  Remove white label marked "2".  Rub patch adhesive wings for 2 additional minutes.   While looking in a mirror, press and release button in center of patch.  A small green light will flash 3-4 times .  This will be your only indicator the monitor has been turned on.     Do not shower for the first 24 hours.  You may shower after the first 24 hours.   Press button if you feel a symptom. You will hear a small click.  Record Date, Time and Symptom in the Patient Log Book.   When you are ready to remove patch, follow instructions on last 2 pages of Patient Log Book.  Stick patch monitor onto last page of Patient Log Book.   Place Patient Log Book in Maud box.  Use locking tab on box and tape box closed securely.  The [employer] and [employer] has [employer] on it.  Please place in mailbox as soon as possible.  Your physician should have your test results approximately 7 days after the monitor has been mailed back to [facility].   Call Craig at [phone] if you have questions regarding your ZIO XT patch monitor.  Call them immediately if you see an orange light blinking on your monitor.   If your monitor falls off in less than 4 days contact our Monitor  department at [phone].  If your monitor becomes loose or falls off after 4 days call [employer] at [phone] for suggestions on securing your monitor.     Follow-Up: At [facility], you and your health needs are our priority.  As part of our continuing mission to provide you with exceptional heart care, we have created designated Provider Care Teams.  These Care Teams include your primary Cardiologist (physician) and Advanced Practice Providers (APPs -  Physician Assistants and Nurse Practitioners) who all work together to provide you with the care you need, when you need it.  We recommend signing up for the patient portal called "MyChart".  Sign up information is provided on this After Visit Summary.  MyChart is used to connect with patients for Virtual Visits (Telemedicine).  Patients are able to view lab/test results, encounter notes, upcoming appointments, etc.  Non-urgent messages can be sent to your provider as well.   To learn more about what you can do with MyChart, go to [email or web address].    Your next appointment:   6 week(s)  The format for your next appointment:   In Person  Provider:   You will see one of the following Advanced Practice Providers on your designated Care Team:    Sande Rives, PA-C  Coletta Memos, FNP  Then, Quay Burow, MD will plan to see you again in 3 month(s).    Other Instructions Please keep at 30 day blood pressure log and return in 4 weeks for an appointment with one of our PharmD.

## 2020-07-26 NOTE — ED Notes (Signed)
Pt very anxious, constantly worried about vitals.  RN encouraged pt to take deep breaths and try to no pay attention to monitor.

## 2020-07-26 NOTE — Assessment & Plan Note (Signed)
Patient reports palpitations which took her to the ER yesterday.  Work-up was unrevealing.  I am going to check thyroid function studies on her and up to 2 weeks Zio patch to further evaluate

## 2020-07-26 NOTE — Progress Notes (Signed)
Enrolled patient for a 14 day Zio XT Monitor to be mailed to patients home  

## 2020-07-26 NOTE — Assessment & Plan Note (Signed)
History of essential potential blood pressure measured today at 184/94.  She is on carvedilol and losartan.  I am going to calibrate her home cuff with our cuff today and have her keep a 30-day blood pressure log.  Of note increase her carvedilol from 12.5 mg p.o. twice daily to 25 mg p.o. twice daily.  She will see Pharm.D. back in 1 month to review make appropriate adjustments.

## 2020-07-26 NOTE — Progress Notes (Signed)
07/26/2020 SHERL YZAGUIRRE Morris   Jul 03, 1965  [id number]  Primary Physician Tysinger, Camelia Eng, PA-C Primary Cardiologist: Lorretta Harp MD FACP, [place], [place], [place]  HPI:  Tina Horton is a 55 y.o.  moderately overweight married [place] American female mother of 3 who I last saw in the office 05/30/2020.  She is accompanied by her husband Tina Horton today.[name] She was referred for preoperative clearance before elective hysterectomy. She currently is out of work for the last 2 years but did work as a Chemical engineer at [employer]. Her cardiac risk factor profile is notable for treated hypertension, diabetes and hyperlipidemia. She has never had a heart attack or stroke. She denies chest pain or shortness of breath. She does have a murmur apparently.  I obtain a 2D echocardiogram on 09/04/2015 which was entirely normal.  Routine GXT done for preoperative clearance for a hysterectomy was normal as well.  Since I saw her in the office 2 months ago she has developed palpitations for unclear reasons.  She did have COVID-19 in fall of last year and has had difficult blood pressure management since that time.  She was seen in the ER last night for palpitations although the work-up was unrevealing.  She denies chest pain or shortness of breath.    No outpatient medications have been marked as taking for the 07/26/20 encounter (Office Visit) with Lorretta Harp, MD.     Allergies  Allergen Reactions  . Amlodipine     Swelling   . Lisinopril     Refuses, says it causes cancer  . Metformin And Related     Vomiting, diarrhea, nausea  . Tanzeum [Albiglutide] Nausea And Vomiting    nausea    Social History   Socioeconomic History  . Marital status: Married    Spouse name: Not on file  . Number of children: Not on file  . Years of education: Not on file  . Highest education level: Not on file  Occupational History  . Not on file  Tobacco Use  . Smoking status: Never Smoker  . Smokeless  tobacco: Never Used  Vaping Use  . Vaping Use: Never used  Substance and Sexual Activity  . Alcohol use: No    Alcohol/week: 0.0 standard drinks  . Drug use: No  . Sexual activity: Not on file  Other Topics Concern  . Not on file  Social History Narrative   Married, has 3 children, not exercising, but walks at work.     Lives in a one story home.     On disability.  Education: some college.   Social Determinants of Health   Financial Resource Strain: Not on file  Food Insecurity: Not on file  Transportation Needs: Not on file  Physical Activity: Not on file  Stress: Not on file  Social Connections: Not on file  Intimate Partner Violence: Not on file     Review of Systems: General: negative for chills, fever, night sweats or weight changes.  Cardiovascular: negative for chest pain, dyspnea on exertion, edema, orthopnea, palpitations, paroxysmal nocturnal dyspnea or shortness of breath Dermatological: negative for rash Respiratory: negative for cough or wheezing Urologic: negative for hematuria Abdominal: negative for nausea, vomiting, diarrhea, bright red blood per rectum, melena, or hematemesis Neurologic: negative for visual changes, syncope, or dizziness All other systems reviewed and are otherwise negative except as noted above.    Blood pressure (!) 184/94, pulse 90, height 5\' 6"  (1.676 m), weight 135 lb  9.6 oz (61.5 kg), last menstrual period 09/13/2015, SpO2 100 %.  General appearance: alert and no distress Neck: no adenopathy, no carotid bruit, no JVD, supple, symmetrical, trachea midline and thyroid not enlarged, symmetric, no tenderness/mass/nodules Lungs: clear to auscultation bilaterally Heart: regular rate and rhythm, S1, S2 normal, no murmur, click, rub or gallop Extremities: extremities normal, atraumatic, no cyanosis or edema Pulses: 2+ and symmetric Skin: Skin color, texture, turgor normal. No rashes or lesions Neurologic: Alert and oriented X 3, normal  strength and tone. Normal symmetric reflexes. Normal coordination and gait  EKG not performed today  ASSESSMENT AND PLAN:   Essential hypertension History of essential potential blood pressure measured today at 184/94.  She is on carvedilol and losartan.  I am going to calibrate her home cuff with our cuff today and have her keep a 30-day blood pressure log.  Of note increase her carvedilol from 12.5 mg p.o. twice daily to 25 mg p.o. twice daily.  She will see Pharm.D. back in 1 month to review make appropriate adjustments.  Hyperlipidemia History of hyperlipidemia lipid profile performed 07/24/2020 revealing total cholesterol 228, LDL of 165 and HDL 48.  And then to begin her on atorvastatin 40 mg a day and will recheck a lipid liver profile in 2 months.  Palpitation Patient reports palpitations which took her to the ER yesterday.  Work-up was unrevealing.  I am going to check thyroid function studies on her and up to 2 weeks Zio patch to further evaluate      Lorretta Harp MD [facility], [facility] 07/26/2020 10:20 AM

## 2020-07-27 ENCOUNTER — Other Ambulatory Visit: Payer: Self-pay

## 2020-07-27 ENCOUNTER — Observation Stay (HOSPITAL_COMMUNITY)
Admission: EM | Admit: 2020-07-27 | Discharge: 2020-07-28 | Disposition: A | Discharge status: Home / Self Care | Payer: Medicare HMO | Attending: Internal Medicine | Admitting: Internal Medicine

## 2020-07-27 ENCOUNTER — Emergency Department (HOSPITAL_COMMUNITY): Payer: Medicare HMO

## 2020-07-27 ENCOUNTER — Inpatient Hospital Stay (HOSPITAL_COMMUNITY): Payer: Medicare HMO

## 2020-07-27 DIAGNOSIS — N189 Chronic kidney disease, unspecified: Secondary | ICD-10-CM | POA: Diagnosis not present

## 2020-07-27 DIAGNOSIS — Z7982 Long term (current) use of aspirin: Secondary | ICD-10-CM | POA: Insufficient documentation

## 2020-07-27 DIAGNOSIS — I639 Cerebral infarction, unspecified: Principal | ICD-10-CM | POA: Diagnosis present

## 2020-07-27 DIAGNOSIS — W19XXXA Unspecified fall, initial encounter: Secondary | ICD-10-CM | POA: Diagnosis not present

## 2020-07-27 DIAGNOSIS — E041 Nontoxic single thyroid nodule: Secondary | ICD-10-CM | POA: Diagnosis not present

## 2020-07-27 DIAGNOSIS — N179 Acute kidney failure, unspecified: Secondary | ICD-10-CM

## 2020-07-27 DIAGNOSIS — E1165 Type 2 diabetes mellitus with hyperglycemia: Secondary | ICD-10-CM

## 2020-07-27 DIAGNOSIS — I6381 Other cerebral infarction due to occlusion or stenosis of small artery: Secondary | ICD-10-CM

## 2020-07-27 DIAGNOSIS — I129 Hypertensive chronic kidney disease with stage 1 through stage 4 chronic kidney disease, or unspecified chronic kidney disease: Secondary | ICD-10-CM | POA: Insufficient documentation

## 2020-07-27 DIAGNOSIS — E1122 Type 2 diabetes mellitus with diabetic chronic kidney disease: Secondary | ICD-10-CM | POA: Diagnosis not present

## 2020-07-27 DIAGNOSIS — E785 Hyperlipidemia, unspecified: Secondary | ICD-10-CM | POA: Diagnosis present

## 2020-07-27 DIAGNOSIS — M6281 Muscle weakness (generalized): Secondary | ICD-10-CM | POA: Insufficient documentation

## 2020-07-27 DIAGNOSIS — Z794 Long term (current) use of insulin: Secondary | ICD-10-CM | POA: Diagnosis not present

## 2020-07-27 DIAGNOSIS — R002 Palpitations: Secondary | ICD-10-CM | POA: Diagnosis present

## 2020-07-27 DIAGNOSIS — E118 Type 2 diabetes mellitus with unspecified complications: Secondary | ICD-10-CM | POA: Diagnosis not present

## 2020-07-27 DIAGNOSIS — N1831 Chronic kidney disease, stage 3a: Secondary | ICD-10-CM | POA: Diagnosis not present

## 2020-07-27 DIAGNOSIS — Z79899 Other long term (current) drug therapy: Secondary | ICD-10-CM | POA: Diagnosis not present

## 2020-07-27 DIAGNOSIS — Z8616 Personal history of COVID-19: Secondary | ICD-10-CM | POA: Insufficient documentation

## 2020-07-27 DIAGNOSIS — IMO0002 Reserved for concepts with insufficient information to code with codable children: Secondary | ICD-10-CM

## 2020-07-27 DIAGNOSIS — I10 Essential (primary) hypertension: Secondary | ICD-10-CM | POA: Diagnosis not present

## 2020-07-27 DIAGNOSIS — E782 Mixed hyperlipidemia: Secondary | ICD-10-CM | POA: Diagnosis not present

## 2020-07-27 DIAGNOSIS — R29818 Other symptoms and signs involving the nervous system: Secondary | ICD-10-CM | POA: Diagnosis not present

## 2020-07-27 DIAGNOSIS — Z20822 Contact with and (suspected) exposure to covid-19: Secondary | ICD-10-CM | POA: Diagnosis not present

## 2020-07-27 DIAGNOSIS — I213 ST elevation (STEMI) myocardial infarction of unspecified site: Secondary | ICD-10-CM | POA: Diagnosis not present

## 2020-07-27 DIAGNOSIS — R262 Difficulty in walking, not elsewhere classified: Secondary | ICD-10-CM | POA: Diagnosis not present

## 2020-07-27 LAB — URINALYSIS, ROUTINE W REFLEX MICROSCOPIC
Bilirubin Urine: NEGATIVE
Glucose, UA: 50 mg/dL — AB
Ketones, ur: 20 mg/dL — AB
Leukocytes,Ua: NEGATIVE
Nitrite: NEGATIVE
Protein, ur: NEGATIVE mg/dL
Specific Gravity, Urine: 1.005 (ref 1.005–1.030)
pH: 5 (ref 5.0–8.0)

## 2020-07-27 LAB — HIV ANTIBODY (ROUTINE TESTING W REFLEX): HIV Screen 4th Generation wRfx: NONREACTIVE

## 2020-07-27 LAB — DIFFERENTIAL
Abs Immature Granulocytes: 0.01 10*3/uL (ref 0.00–0.07)
Basophils Absolute: 0 10*3/uL (ref 0.0–0.1)
Basophils Relative: 0 %
Eosinophils Absolute: 0.1 10*3/uL (ref 0.0–0.5)
Eosinophils Relative: 2 %
Immature Granulocytes: 0 %
Lymphocytes Relative: 29 %
Lymphs Abs: 1.7 10*3/uL (ref 0.7–4.0)
Monocytes Absolute: 0.4 10*3/uL (ref 0.1–1.0)
Monocytes Relative: 7 %
Neutro Abs: 3.8 10*3/uL (ref 1.7–7.7)
Neutrophils Relative %: 62 %

## 2020-07-27 LAB — RAPID URINE DRUG SCREEN, HOSP PERFORMED
Amphetamines: NOT DETECTED
Barbiturates: NOT DETECTED
Benzodiazepines: NOT DETECTED
Cocaine: NOT DETECTED
Opiates: NOT DETECTED
Tetrahydrocannabinol: NOT DETECTED

## 2020-07-27 LAB — RESP PANEL BY RT-PCR (FLU A&B, COVID) ARPGX2
Influenza A by PCR: NEGATIVE
Influenza B by PCR: NEGATIVE
SARS Coronavirus 2 by RT PCR: NEGATIVE

## 2020-07-27 LAB — COMPREHENSIVE METABOLIC PANEL
ALT: 16 U/L (ref 0–44)
AST: 19 U/L (ref 15–41)
Albumin: 4.2 g/dL (ref 3.5–5.0)
Alkaline Phosphatase: 71 U/L (ref 38–126)
Anion gap: 13 (ref 5–15)
BUN: 19 mg/dL (ref 6–20)
CO2: 25 mmol/L (ref 22–32)
Calcium: 9.9 mg/dL (ref 8.9–10.3)
Chloride: 98 mmol/L (ref 98–111)
Creatinine, Ser: 1.2 mg/dL — ABNORMAL HIGH (ref 0.44–1.00)
GFR, Estimated: 53 mL/min — ABNORMAL LOW (ref >60)
Glucose, Bld: 200 mg/dL — ABNORMAL HIGH (ref 70–99)
Potassium: 3.4 mmol/L — ABNORMAL LOW (ref 3.5–5.1)
Sodium: 136 mmol/L (ref 135–145)
Total Bilirubin: 1.5 mg/dL — ABNORMAL HIGH (ref 0.3–1.2)
Total Protein: 7.3 g/dL (ref 6.5–8.1)

## 2020-07-27 LAB — CBC
HCT: 38 % (ref 36.0–46.0)
Hemoglobin: 13 g/dL (ref 12.0–15.0)
MCH: 29.7 pg (ref 26.0–34.0)
MCHC: 34.2 g/dL (ref 30.0–36.0)
MCV: 87 fL (ref 80.0–100.0)
Platelets: 255 10*3/uL (ref 150–400)
RBC: 4.37 MIL/uL (ref 3.87–5.11)
RDW: 12.5 % (ref 11.5–15.5)
WBC: 6 10*3/uL (ref 4.0–10.5)
nRBC: 0 % (ref 0.0–0.2)

## 2020-07-27 LAB — I-STAT CHEM 8, ED
BUN: 19 mg/dL (ref 6–20)
Calcium, Ion: 1.26 mmol/L (ref 1.15–1.40)
Chloride: 99 mmol/L (ref 98–111)
Creatinine, Ser: 1 mg/dL (ref 0.44–1.00)
Glucose, Bld: 192 mg/dL — ABNORMAL HIGH (ref 70–99)
HCT: 37 % (ref 36.0–46.0)
Hemoglobin: 12.6 g/dL (ref 12.0–15.0)
Potassium: 3.5 mmol/L (ref 3.5–5.1)
Sodium: 139 mmol/L (ref 135–145)
TCO2: 25 mmol/L (ref 22–32)

## 2020-07-27 LAB — PROTIME-INR
INR: 1.1 (ref 0.8–1.2)
Prothrombin Time: 13.6 seconds (ref 11.4–15.2)

## 2020-07-27 LAB — CBG MONITORING, ED
Glucose-Capillary: 164 mg/dL — ABNORMAL HIGH (ref 70–99)
Glucose-Capillary: 118 mg/dL — ABNORMAL HIGH (ref 70–99)

## 2020-07-27 LAB — APTT: aPTT: 35 seconds (ref 24–36)

## 2020-07-27 LAB — ETHANOL: Alcohol, Ethyl (B): <10 mg/dL (ref <10)

## 2020-07-27 LAB — T4, FREE: Free T4: 1.51 ng/dL — ABNORMAL HIGH (ref 0.61–1.12)

## 2020-07-27 MED ORDER — CLOPIDOGREL BISULFATE 75 MG PO TABS
75.0000 mg | ORAL_TABLET | Freq: Every day | ORAL | Status: DC
  Administered 2020-07-28: 75 mg via ORAL
  Filled 2020-07-27 (×2): qty 1

## 2020-07-27 MED ORDER — ATORVASTATIN CALCIUM 40 MG PO TABS
40.0000 mg | ORAL_TABLET | Freq: Every day | ORAL | Status: DC
Start: 2020-07-27 — End: 2020-07-28
  Administered 2020-07-27 – 2020-07-28 (×2): 40 mg via ORAL
  Filled 2020-07-27 (×3): qty 1

## 2020-07-27 MED ORDER — CLOPIDOGREL BISULFATE 75 MG PO TABS: 75.0000 mg | ORAL_TABLET | Freq: Every day | ORAL | Status: DC

## 2020-07-27 MED ORDER — HYDRALAZINE HCL 20 MG/ML IJ SOLN: 10.0000 mg | INTRAMUSCULAR | Status: DC | PRN

## 2020-07-27 MED ORDER — LATANOPROST 0.005 % OP SOLN
1.0000 [drp] | Freq: Every day | OPHTHALMIC | Status: DC
  Filled 2020-07-27: qty 2.5

## 2020-07-27 MED ORDER — INSULIN ASPART 100 UNIT/ML ~~LOC~~ SOLN
0.0000 [IU] | Freq: Three times a day (TID) | SUBCUTANEOUS | Status: DC
  Administered 2020-07-28: 7 [IU] via SUBCUTANEOUS
  Administered 2020-07-28 (×2): 4 [IU] via SUBCUTANEOUS

## 2020-07-27 MED ORDER — ASPIRIN EC 81 MG PO TBEC
81.0000 mg | DELAYED_RELEASE_TABLET | Freq: Every day | ORAL | Status: DC
  Administered 2020-07-27 – 2020-07-28 (×2): 81 mg via ORAL
  Filled 2020-07-27 (×3): qty 1

## 2020-07-27 MED ORDER — LOSARTAN POTASSIUM 50 MG PO TABS
100.0000 mg | ORAL_TABLET | Freq: Every day | ORAL | Status: DC
  Administered 2020-07-28: 100 mg via ORAL
  Filled 2020-07-27: qty 2

## 2020-07-27 MED ORDER — SODIUM CHLORIDE 0.9 % IV SOLN
Freq: Once | INTRAVENOUS | Status: AC
  Administered 2020-07-27: 1000 mL via INTRAVENOUS

## 2020-07-27 MED ORDER — ACETAMINOPHEN 325 MG PO TABS: 650.0000 mg | ORAL_TABLET | ORAL | Status: DC | PRN

## 2020-07-27 MED ORDER — CLOPIDOGREL BISULFATE 300 MG PO TABS: 300.0000 mg | ORAL_TABLET | Freq: Once | ORAL | Status: DC

## 2020-07-27 MED ORDER — INSULIN DETEMIR 100 UNIT/ML ~~LOC~~ SOLN
30.0000 [IU] | Freq: Every day | SUBCUTANEOUS | Status: DC
  Filled 2020-07-27 (×2): qty 0.3

## 2020-07-27 MED ORDER — STROKE: EARLY STAGES OF RECOVERY BOOK
Freq: Once | Status: DC
  Filled 2020-07-27: qty 1

## 2020-07-27 MED ORDER — ALBUTEROL SULFATE (2.5 MG/3ML) 0.083% IN NEBU: 2.5000 mg | INHALATION_SOLUTION | Freq: Four times a day (QID) | RESPIRATORY_TRACT | Status: DC | PRN

## 2020-07-27 MED ORDER — ENOXAPARIN SODIUM 40 MG/0.4ML ~~LOC~~ SOLN
40.0000 mg | SUBCUTANEOUS | Status: DC
  Administered 2020-07-27 – 2020-07-28 (×2): 40 mg via SUBCUTANEOUS
  Filled 2020-07-27 (×2): qty 0.4

## 2020-07-27 MED ORDER — ACETAMINOPHEN 160 MG/5ML PO SOLN: 650.0000 mg | ORAL | Status: DC | PRN

## 2020-07-27 MED ORDER — DORZOLAMIDE HCL-TIMOLOL MAL 2-0.5 % OP SOLN
1.0000 [drp] | Freq: Two times a day (BID) | OPHTHALMIC | Status: DC
  Administered 2020-07-28: 1 [drp] via OPHTHALMIC
  Filled 2020-07-27: qty 10

## 2020-07-27 MED ORDER — ACETAMINOPHEN 650 MG RE SUPP: 650.0000 mg | RECTAL | Status: DC | PRN

## 2020-07-27 MED ORDER — CLOPIDOGREL BISULFATE 300 MG PO TABS
300.0000 mg | ORAL_TABLET | Freq: Once | ORAL | Status: AC
  Administered 2020-07-27: 300 mg via ORAL
  Filled 2020-07-27: qty 1

## 2020-07-27 MED ORDER — LORATADINE 10 MG PO TABS: 10.0000 mg | ORAL_TABLET | Freq: Every day | ORAL | Status: DC | PRN

## 2020-07-27 MED ORDER — SENNOSIDES-DOCUSATE SODIUM 8.6-50 MG PO TABS: 1.0000 | ORAL_TABLET | Freq: Every evening | ORAL | Status: DC | PRN

## 2020-07-27 MED ORDER — CARVEDILOL 12.5 MG PO TABS
25.0000 mg | ORAL_TABLET | Freq: Two times a day (BID) | ORAL | Status: DC
  Administered 2020-07-28: 25 mg via ORAL
  Filled 2020-07-27: qty 2

## 2020-07-27 NOTE — ED Notes (Signed)
Pt urinated at the bedside commode

## 2020-07-27 NOTE — ED Notes (Signed)
Checked pt CBG at 17:57, resulted @ 106 but did not populate from the glucometer, reason unknown.

## 2020-07-27 NOTE — Consult Note (Addendum)
Neurology Consultation Reason for Consult: R CVA Requesting Physician: Fuller Plan  CC: CVA  History is obtained from:patient and daughter at bedside  HPI: Tina Horton is a 55 y.o. female who has been experiencing Left extremity weakness since about Monday. She is unable to describe her last known well since she has been having palpitations since December and equates this to her current symptoms. She endorses having the sensation of dragging her left leg while walking, especially on stairs which started at least 3 days ago, and clumsiness of the left arm at least since 3/9. She had acute worsening of left arm weakness when she awoke on the morning of 3/10. Denies any changes in sensation.  Patient and daughter deny noticing any facial droop or speech abnormalities. She is legally blind in both eyes including central vision loss of left eye and loss of peripheral vision on left side. She can make out lights and shadows/movements. Denies any recent changes in her visual acuity.  No history of personal or significant second hand smoke exposure. She has taken ASA 53m daily chronically for years other than a few missed doses.  Her cardiologist gave her atorvastatin 462myesterday after elevated lipid evaluation- (total: 228, HDL 48, LDL 165) but she has not taken a dose yet.  She takes insulin typically but has not for several days due to low normal blood sugars. Additional medications she took this morning were carvedilol and losartan.  Denies any bleeding rectally, oral, or vaginal. She had a hysterectomy 2016. Has no scheduled upcoming procedures.  LKW: July 23, 2020? tPA given?: No, due to out of the window  Premorbid modified rankin scale: 1     1 - No significant disability. Able to carry out all usual activities, despite some symptoms. (Significant baseline visual impairment)  ROS: All other review of systems was negative except as noted in the HPI.   Past Medical History:   Diagnosis Date  . Anemia   . Arthritis    knees  . Bronchitis   . [facility]   age 55yo. GERD (gastroesophageal reflux disease)   . Glaucoma    Dr. McEinar Gip. H/O mammogram 2005  . Headache   . Heart murmur   . Hyperlipidemia   . Hypertension   . Intermittent palpitations   . Legally blind   . Myopia   . Pneumonia   . Routine gynecological examination    last pap 2005  . Seasonal allergies   . Shortness of breath dyspnea   . Sinusitis   . Thyroid nodule    Family History  Problem Relation Age of Onset  . Diabetes Mother   . Hypertension Mother   . Stroke Mother   . Hypertension Father   . Chronic Renal Failure Father   . Hypertension Sister   . Hypertension Brother   . Stroke Maternal Grandmother   . Asthma Daughter   . Healthy Daughter   . Healthy Son   . Cancer Neg Hx   . Heart disease Neg Hx     Current Facility-Administered Medications:  .   stroke: mapping our early stages of recovery book, , Does not apply, Once, Smith, Rondell A, MD .  acetaminophen (TYLENOL) tablet 650 mg, 650 mg, Oral, Q4H PRN **OR** acetaminophen (TYLENOL) 160 MG/5ML solution 650 mg, 650 mg, Per Tube, Q4H PRN **OR** acetaminophen (TYLENOL) suppository 650 mg, 650 mg, Rectal, Q4H PRN, SmNorval MortonMD .  aspirin  EC tablet 81 mg, 81 mg, Oral, Daily, Smith, Rondell A, MD, 81 mg at 07/27/20 1306 .  atorvastatin (LIPITOR) tablet 40 mg, 40 mg, Oral, Daily, Smith, Rondell A, MD, 40 mg at 07/27/20 1414 .  carvedilol (COREG) tablet 25 mg, 25 mg, Oral, BID, Smith, Rondell A, MD .  [COMPLETED] clopidogrel (PLAVIX) tablet 300 mg, 300 mg, Oral, Once, 300 mg at 07/27/20 1419 **FOLLOWED BY** [START ON 07/28/2020] clopidogrel (PLAVIX) tablet 75 mg, 75 mg, Oral, Daily, Anderson, Chelsey L, DO .  dorzolamide-timolol (COSOPT) 22.3-6.8 MG/ML ophthalmic solution 1 drop, 1 drop, Both Eyes, BID, Smith, Rondell A, MD .  enoxaparin (LOVENOX) injection 40 mg, 40 mg,  Subcutaneous, Q24H, Smith, Rondell A, MD, 40 mg at 07/27/20 1414 .  hydrALAZINE (APRESOLINE) injection 10 mg, 10 mg, Intravenous, Q4H PRN, Smith, Rondell A, MD .  insulin aspart (novoLOG) injection 0-20 Units, 0-20 Units, Subcutaneous, TID WC, Smith, Rondell A, MD .  insulin detemir (LEVEMIR) injection 30 Units, 30 Units, Subcutaneous, QHS, Smith, Rondell A, MD .  latanoprost (XALATAN) 0.005 % ophthalmic solution 1 drop, 1 drop, Both Eyes, QHS, Smith, Rondell A, MD .  Derrill Memo ON 07/28/2020] losartan (COZAAR) tablet 100 mg, 100 mg, Oral, Daily, Smith, Rondell A, MD .  senna-docusate (Senokot-S) tablet 1 tablet, 1 tablet, Oral, QHS PRN, Fuller Plan A, MD  Current Outpatient Medications:  .  aspirin EC 81 MG tablet, Take 1 tablet (81 mg total) by mouth daily., Disp: 90 tablet, Rfl: 3 .  carvedilol (COREG) 25 MG tablet, Take 1 tablet (25 mg total) by mouth 2 (two) times daily., Disp: 180 tablet, Rfl: 3 .  cholecalciferol (VITAMIN D3) 25 MCG (1000 UNIT) tablet, Take 1,000 Units by mouth See admin instructions. Take 1 tablet (1000 units totally) by mouth every day; except take 5000 units totally by mouth on Tuesday and Friday, Disp: , Rfl:  .  dorzolamide-timolol (COSOPT) 22.3-6.8 MG/ML ophthalmic solution, Place 1 drop into both eyes 2 (two) times daily., Disp: , Rfl:  .  latanoprost (XALATAN) 0.005 % ophthalmic solution, Place 1 drop into both eyes at bedtime., Disp: , Rfl:  .  LEVEMIR FLEXTOUCH 100 UNIT/ML FlexPen, INJECT 40 UNITS SUBCUTANEOUSLY AT BEDTIME (Patient taking differently: Inject 38 Units into the skin at bedtime.), Disp: 15 mL, Rfl: 0 .  losartan (COZAAR) 100 MG tablet, Take 1 tablet (100 mg total) by mouth daily., Disp: 90 tablet, Rfl: 3 .  NOVOLOG FLEXPEN 100 UNIT/ML FlexPen, INJECT 20 UNITS SUBCUTANEOUSLY THREE TIMES DAILY WITH MEALS (USE SLIDING SCALE PROVIDED BY MD)(PEN EXPIRES AT 28 DAYS) (Patient taking differently: Inject 16 Units into the skin 3 (three) times daily with meals.),  Disp: 60 mL, Rfl: 5 .  Alcohol Swabs (B-D SINGLE USE SWABS REGULAR) PADS, USE  WHEN  TESTING BLOOD SUGAR, Disp: 300 each, Rfl: 0 .  atorvastatin (LIPITOR) 40 MG tablet, Take 1 tablet (40 mg total) by mouth daily., Disp: 90 tablet, Rfl: 3 .  Blood Glucose Monitoring Suppl (PRODIGY AUTOCODE BLOOD GLUCOSE) w/Device KIT, , Disp: , Rfl:  .  glucose blood (PRODIGY NO CODING BLOOD GLUC) test strip, Use as instructed, Disp: 100 each, Rfl: 12 .  Insulin Pen Needle (NOVOFINE PLUS) 32G X 4 MM MISC, Use 4 times a day with Lantus Insulin, Disp: 1000 each, Rfl: 0 .  meclizine (ANTIVERT) 25 MG tablet, Take 1 tablet (25 mg total) by mouth 3 (three) times daily as needed for dizziness., Disp: 30 tablet, Rfl: 0 .  PRODIGY NO CODING BLOOD GLUC  test strip, TEST BLOOD SUGAR THREE TIMES DAILY, Disp: 300 strip, Rfl: 0 .  Prodigy Twist Top Lancets 28G MISC, USE TO TEST BLOOD SUGAR THREE TIMES DAILY, Disp: 300 each, Rfl: 0   Social History:  reports that she has never smoked. She has never used smokeless tobacco. She reports that she does not drink alcohol and does not use drugs. Lives with husband and daughter.  Exam: Current vital signs: BP (!) 183/85   Pulse 85   Temp 97.9 F (36.6 C) (Oral)   Resp 20   LMP 09/13/2015   SpO2 100%  Vital signs in last 24 hours: Temp:  [97.9 F (36.6 C)] 97.9 F (36.6 C) (03/10 0747) Pulse Rate:  [83-95] 85 (03/10 1120) Resp:  [18-27] 20 (03/10 1120) BP: (159-183)/(85-101) 183/85 (03/10 1120) SpO2:  [100 %] 100 % (03/10 1120)   Physical Exam  Constitutional: Appears well-developed and well-nourished.  Psych: Affect appropriate to situation, mildly anxious Eyes: No scleral injection HENT: No oropharyngeal obstruction. Mild rhinorrhea MSK: no joint deformities.  Cardiovascular: Normal rate and regular rhythm. Systolic murmur present Respiratory: Effort normal, non-labored breathing GI: Soft.  No distension. There is no tenderness.  Skin: Warm dry and intact visible  skin  Neuro: Mental Status: Patient is awake, alert, oriented to person, place, month, year, and situation. Patient is able to give a clear and coherent history. No signs of aphasia or neglect Cranial Nerves: II: Visual Fields are limited in left periphery which is unchanged from patient's baseline, per patient. Pupils are round, and reactive to light, there is a subtle left afferent pupillary defect III,IV, VI: EOMI without ptosis or diploplia.  V: Facial sensation is symmetric to temperature VII: Facial movement is symmetric.  VIII: hearing is intact to voice, able to localize sound X: Uvula elevates symmetrically XI: Shoulder shrug is symmetric. XII: tongue is midline without atrophy or fasciculations.  Motor: Tone is normal. Bulk is normal. 5/5 strength was present in R UE and LE. Left LE 4/5 strength, subtle weakness of left UE seen with positive pronator drift  Sensory: Sensation is symmetric to light touch and temperature in the arms and legs. Deep Tendon Reflexes: 2+ and symmetric in the biceps and patellae. Plantars: Toes are downgoing bilaterally.  Cerebellar: Mild dysmetria of the left upper extremity  NIHSS total 2 Score breakdown:  1 point left leg drift, 1 point for left arm dysmetria  I have reviewed labs in epic and the results pertinent to this consultation are:  Lipid Panel     Component Value Date/Time   CHOL 228 (H) 07/24/2020 1116   TRIG 83 07/24/2020 1116   HDL 48 07/24/2020 1116   CHOLHDL 4.8 (H) 07/24/2020 1116   CHOLHDL 3.4 12/04/2015 0940   VLDL 13 12/04/2015 0940   LDLCALC 165 (H) 07/24/2020 1116   LABVLDL 15 07/24/2020 1116   CBG: 177>116>579 BMET: normal, except glucose UDS neg TSH 3/9: normal hgb A1c 3/9: 8.5  CBC    Component Value Date/Time   WBC 6.0 07/27/2020 0837   RBC 4.37 07/27/2020 0837   HGB 12.6 07/27/2020 1007   HGB 12.8 11/29/2019 1011   HCT 37.0 07/27/2020 1007   HCT 39.0 11/29/2019 1011   PLT 255 07/27/2020 0837    PLT 287 11/29/2019 1011   MCV 87.0 07/27/2020 0837   MCV 89 11/29/2019 1011   MCH 29.7 07/27/2020 0837   MCHC 34.2 07/27/2020 0837   RDW 12.5 07/27/2020 0837   RDW 12.3 11/29/2019 1011  LYMPHSABS 1.7 07/27/2020 0837   LYMPHSABS 2.2 11/29/2019 1011   MONOABS 0.4 07/27/2020 0837   EOSABS 0.1 07/27/2020 0837   EOSABS 0.2 11/29/2019 1011   BASOSABS 0.0 07/27/2020 0837   BASOSABS 0.0 11/29/2019 1011   I have reviewed the images obtained: DG Chest 2 View  Result Date: 07/25/2020 CLINICAL DATA:  Chest pain.  Palpitations. EXAM: CHEST - 2 VIEW COMPARISON:  05/19/2020 FINDINGS: The cardiomediastinal contours are normal. The lungs are clear. Pulmonary vasculature is normal. No consolidation, pleural effusion, or pneumothorax. No acute osseous abnormalities are seen. IMPRESSION: Negative radiographs of the chest. Electronically Signed   By: Keith Rake M.D.   On: 07/25/2020 23:18   CT HEAD WO CONTRAST  Result Date: 07/27/2020 CLINICAL DATA:  Neuro deficit, acute stroke suspected. EXAM: CT HEAD WITHOUT CONTRAST TECHNIQUE: Contiguous axial images were obtained from the base of the skull through the vertex without intravenous contrast. COMPARISON:  None. FINDINGS: Brain: Abnormal hypodensity in the right basal ganglia extending into the overlying corona radiata, compatible with acute infarct which is better characterized on same day MRI (which is begun/in progress at the time of dictation). Mild edema without mass effect. No midline shift. Basal cisterns are patent. No acute hemorrhage. No hydrocephalus. No extra-axial fluid collection. No mass lesion. Vascular: Calcific atherosclerosis. No evidence of a hyperdense vessel. Skull: No acute fracture. Sinuses/Orbits: Visualized sinuses are clear. Other: No mastoid effusions. IMPRESSION: Acute infarct in the right basal ganglia, extending into overlying corona radiata. Mild edema without mass effect. Electronically Signed   By: Margaretha Sheffield MD   On:  07/27/2020 09:38   MR BRAIN WO CONTRAST  Result Date: 07/27/2020 CLINICAL DATA:  Neuro deficit, acute stroke suspected. EXAM: MRI HEAD WITHOUT CONTRAST TECHNIQUE: Multiplanar, multiecho pulse sequences of the brain and surrounding structures were obtained without intravenous contrast. COMPARISON:  Same day head CT. FINDINGS: Brain: Acute infarct involving the right corona radiata and right basal ganglia. Associated edema without mass effect. No acute hemorrhage. No hydrocephalus. No mass lesion. No midline shift. Basal cisterns are patent. No extra-axial fluid collection. Mild additional small T2/FLAIR hyperintensities, nonspecific but most likely related to chronic microvascular ischemic disease. Vascular: Major arterial flow voids are maintained at the skull base. Skull and upper cervical spine: Normal marrow signal. Sinuses/Orbits: Clear sinuses.  Bilateral posterior staphylomas. Other: No mastoid effusions. IMPRESSION: 1. 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 Associated edema without mass effect. 2. Bilateral posterior staphylomas. Electronically Signed   By: Margaretha Sheffield MD   On: 07/27/2020 09:57   A/P: 55yo female with poorly controlled DM, HLD, and HTN, and blindness presents with CVA outside of window for intervention.  Etiology of the stroke is likely small vessel disease though given the size of it embolic stroke should also be considered, atheroembolic versus cardioembolic.  Given her history of palpitations, agree with cardiology that she very much needs long-term event monitoring.  We will also obtain her echocardiogram inpatient.   Recommendations:  # Right internal capsule/corona radiata stroke - Stroke labs: RPR (TSH, hemoglobin A1c, lipid panel recently obtained as above) - MRI brain completed as above  - CTA, routine, recommended, but reasonable to obtain carotid Dopplers and MRA head without contrast given primary team is concerned about her  chronic kidney disease though we note that her recent creatinine is 1.0 with a normal GFR - Frequent neuro checks, per unit standard - Echocardiogram - Continue home ASA 81 mg daily - Plavix 300 mg  load with 75 mg daily for 21 - 90 day course (ordered for 21 days, increase to 90 days if symptomatic intracranial stenosis) - continue home atorvastatin 40 mg, consider increase to 80 mg. LDL goal <70 - Risk factor modification - Telemetry monitoring; 30 day event monitor on discharge if no arrythmias captured  - Blood pressure goal   - Normotension - PT consult, OT consult, Speech consult,  - Stroke team to follow  # HTN- poorly controlled. 160s/80s. Would gradually normalize now given time since onset of symptoms. - continue home carvedilol, then losartan - consider secondary w/o as op  #Insulin- dependent DM- poorly controlled. Recent A1c 8.5 -SSI  Doristine Mango, DO PGY-3 FM resident   Lesleigh Noe MD-PhD [facility] [phone] Available 7 AM to 7 PM, outside these hours please contact Neurologist on call listed on AMION   Attending Neurologist's note: I personally saw this patient, gathering history, performing a full neurologic examination, reviewing relevant labs, personally reviewing relevant imaging including , and formulated the assessment and plan, adding the note above for completeness and clarity to accurately reflect my thoughts

## 2020-07-27 NOTE — ED Provider Notes (Signed)
[facility] EMERGENCY DEPARTMENT Provider Note   CSN: [id number] Arrival date & time: 07/27/20  0745     History Chief Complaint  Patient presents with  . Hypertension    Tina Horton is a 55 y.o. female presenting for evaluation of decreased left-sided coordination and difficulty walking.  Patient states she has had some gait abnormalities and issues with her blood pressure over the past several months, since being diagnosed with Covid last year.  This morning, thought her symptoms were worse.  She reports since yesterday she has had left arm coordination that has felt off, states it is not as fluid of the movement as it normally is, and "feels weird."  She does report a history of ulnar nerve damage, thinks this may be contributing.  She does report a mild right-sided headache that began when she came to the hospital.  No vision changes or slurred speech.  No weakness or numbness.  She denies chest pain, shortness breath, nausea, vomiting abdominal pain, change in urination or bowel movements.  She has been seen multiple times in the past few days for heart palpitations and difficulty controlling her blood pressure.  Her carvedilol has been increased multiple times.  She saw cardiology yesterday, at that time had a reassuring cardiac work-up and was scheduled for an echo and Zio patch.  Additional history obtained from chart review.  I reviewed patient's recent PCP, cardiology, recent ED notes.  History of anemia, diabetes, GERD, hypertension, heart murmur, hyperlipidemia, chronic visual changes resulting in patient being legally blind  HPI     Past Medical History:  Diagnosis Date  . Anemia   . Arthritis    knees  . Bronchitis   . Diabetes mellitus without complication [facility])    age 77yo  . GERD (gastroesophageal reflux disease)   . Glaucoma    Dr. Einar Gip  . H/O mammogram 2005  . Headache   . Heart murmur   . Hyperlipidemia   . Hypertension   .  Intermittent palpitations   . Legally blind   . Myopia   . Pneumonia   . Routine gynecological examination    last pap 2005  . Seasonal allergies   . Shortness of breath dyspnea   . Sinusitis   . Thyroid nodule     Patient Active Problem List   Diagnosis Date Noted  . CVA (cerebral vascular accident) ([place]) 07/27/2020  . Palpitation 07/24/2020  . History of COVID-19 05/22/2020  . COVID-19 virus infection 02/25/2020  . Sinus pressure 02/25/2020  . Acute non-recurrent frontal sinusitis 02/25/2020  . Chronic pain of both knees 01/04/2020  . 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 CKD (chronic kidney disease) stage 2, GFR 60-89 ml/min 11/29/2019  . 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 Vitamin D deficiency 03/13/2018  . Primary osteoarthritis of both knees 01/20/2018  . DDD (degenerative disc disease), lumbar 01/20/2018  . Localized swelling, mass or lump of neck 10/30/2017  . Microalbuminuria 08/26/2017  . Mass of neck 08/26/2017  . Screening for cancer 08/20/2017  . Left leg pain 08/20/2017  . Respiratory tract infection 08/20/2017  . Nausea 08/20/2017  . Renal cyst 01/15/2016  . Hydronephrosis of left kidney 01/15/2016  . Spinal stenosis 01/15/2016  . History of burning pain in leg 12/04/2015  . S/P laparoscopic assisted vaginal hysterectomy (LAVH) 09/13/2015  . Encounter for health maintenance examination in adult 08/21/2015  . Hyperlipidemia 08/21/2015  . Glaucoma 08/21/2015  . Heart murmur 08/21/2015  . Thyroid nodule  08/21/2015  . Vaccine refused by patient 08/21/2015  . Noncompliance 03/21/2015  . Essential hypertension 03/21/2015  . Uncontrolled diabetes mellitus with complication, with long-term current use of insulin ([place]) 03/21/2015  . Lymphadenitis 03/21/2015  . SKIN LESION 10/19/2008  . SYSTOLIC MURMUR 61/60/7371  . GLAUCOMA 07/17/2006  . VISUAL DISTURBANCE NOS 07/17/2006  . REFLUX ESOPHAGITIS 07/17/2006    Past Surgical History:  Procedure Laterality Date  .  CYSTECTOMY     umbilicus  . INCISION AND DRAINAGE     abdominal superficial abscess  . LAPAROSCOPIC VAGINAL HYSTERECTOMY WITH SALPINGECTOMY Bilateral 09/13/2015   Procedure: LAPAROSCOPIC ASSISTED VAGINAL HYSTERECTOMY WITH SALPINGECTOMY, [facility] Colpoplasty;  Surgeon: Thurnell Lose, MD;  Location: [facility];  Service: Gynecology;  Laterality: Bilateral;  . LASIK     x2     OB History   No obstetric history on file.     Family History  Problem Relation Age of Onset  . Diabetes Mother   . Hypertension Mother   . Stroke Mother   . Hypertension Father   . Chronic Renal Failure Father   . Hypertension Sister   . Hypertension Brother   . Stroke Maternal Grandmother   . Asthma Daughter   . Healthy Daughter   . Healthy Son   . Cancer Neg Hx   . Heart disease Neg Hx     Social History   Tobacco Use  . Smoking status: Never Smoker  . Smokeless tobacco: Never Used  Vaping Use  . Vaping Use: Never used  Substance Use Topics  . Alcohol use: No    Alcohol/week: 0.0 standard drinks  . Drug use: No    Home Medications Prior to Admission medications   Medication Sig Start Date End Date Taking? Authorizing Provider  aspirin EC 81 MG tablet Take 1 tablet (81 mg total) by mouth daily. 11/30/19  Yes Tysinger, Camelia Eng, PA-C  carvedilol (COREG) 25 MG tablet Take 1 tablet (25 mg total) by mouth 2 (two) times daily. 07/26/20  Yes Lorretta Harp, MD  Cholecalciferol (DIALYVITE VITAMIN D 5000 PO) Take 5,000 Units by mouth 2 (two) times a week. Bid; f-t   Yes [provider]  cholecalciferol (VITAMIN D3) 25 MCG (1000 UNIT) tablet Take 1,000 Units by mouth See admin instructions. Take 1 tablet (1000 units totally) by mouth every day; except take 5000 units totally by mouth on Tuesday and Friday   Yes [provider]  dorzolamide-timolol (COSOPT) 22.3-6.8 MG/ML ophthalmic solution Place 1 drop into both eyes 2 times daily. 12/23/19 12/22/20 Yes [provider]  latanoprost  (XALATAN) 0.005 % ophthalmic solution Place 1 drop into both eyes at bedtime.   Yes [provider]  LEVEMIR FLEXTOUCH 100 UNIT/ML FlexPen INJECT 40 UNITS SUBCUTANEOUSLY AT BEDTIME Patient taking differently: Inject 38 Units into the skin at bedtime. 06/14/20  Yes Tysinger, Camelia Eng, PA-C  losartan (COZAAR) 100 MG tablet Take 1 tablet (100 mg total) by mouth daily. 12/07/19  Yes Tysinger, Camelia Eng, PA-C  NOVOLOG FLEXPEN 100 UNIT/ML FlexPen INJECT 20 UNITS SUBCUTANEOUSLY THREE TIMES DAILY WITH MEALS (USE SLIDING SCALE PROVIDED BY MD)(PEN EXPIRES AT 28 DAYS) Patient taking differently: Inject 16 Units into the skin 3 (three) times daily with meals. 03/31/20  Yes Tysinger, Camelia Eng, PA-C  Alcohol Swabs (B-D SINGLE USE SWABS REGULAR) PADS USE  WHEN  TESTING BLOOD SUGAR 06/02/20   Tysinger, Camelia Eng, PA-C  atorvastatin (LIPITOR) 40 MG tablet Take 1 tablet (40 mg total) by mouth daily. 07/26/20  10/24/20  Lorretta Harp, MD  Blood Glucose Monitoring Suppl (PRODIGY AUTOCODE BLOOD GLUCOSE) w/Device KIT  12/07/19   [provider]  glucose blood (PRODIGY NO CODING BLOOD GLUC) test strip Use as instructed 11/05/17   Tysinger, Camelia Eng, PA-C  Insulin Pen Needle (NOVOFINE PLUS) 32G X 4 MM MISC Use 4 times a day with Lantus Insulin 12/24/19   Tysinger, Camelia Eng, PA-C  meclizine (ANTIVERT) 25 MG tablet Take 1 tablet (25 mg total) by mouth 3 (three) times daily as needed for dizziness. 06/03/20   Domenic Moras, PA-C  PRODIGY NO CODING BLOOD GLUC test strip TEST BLOOD SUGAR THREE TIMES DAILY 05/26/20   Tysinger, Camelia Eng, PA-C  Prodigy Twist Top Lancets 28G MISC USE TO TEST BLOOD SUGAR THREE TIMES DAILY 05/26/20   Tysinger, Camelia Eng, PA-C    Allergies    Amlodipine, Metformin and related, Tanzeum [albiglutide], and Lisinopril  Review of Systems   Review of Systems  Musculoskeletal: Positive for gait problem.  Neurological: Positive for headaches.       L arm and leg with decreased coordination  All other systems  reviewed and are negative.   Physical Exam Updated Vital Signs BP (!) 183/85   Pulse 85   Temp 97.9 F (36.6 C) (Oral)   Resp 20   LMP 09/13/2015   SpO2 100%   Physical Exam Vitals and nursing note reviewed.  Constitutional:      General: She is not in acute distress.    Appearance: She is well-developed. She is obese.     Comments: Resting in the bed in no acute distress  HENT:     Head: Normocephalic and atraumatic.  Eyes:     Conjunctiva/sclera: Conjunctivae normal.     Pupils: Pupils are equal, round, and reactive to light.  Cardiovascular:     Rate and Rhythm: Normal rate and regular rhythm.     Pulses: Normal pulses.  Pulmonary:     Effort: Pulmonary effort is normal. No respiratory distress.     Breath sounds: Normal breath sounds. No wheezing.  Abdominal:     General: Bowel sounds are normal. There is no distension.     Palpations: Abdomen is soft.     Tenderness: There is no abdominal tenderness.  Musculoskeletal:        General: Normal range of motion.     Cervical back: Normal range of motion and neck supple.  Skin:    General: Skin is warm and dry.     Capillary Refill: Capillary refill takes less than 2 seconds.  Neurological:     Mental Status: She is alert and oriented to person, place, and time.     GCS: GCS eye subscore is 4. GCS verbal subscore is 5. GCS motor subscore is 6.     Cranial Nerves: Cranial nerves are intact.     Sensory: Sensation is intact.     Motor: Weakness and pronator drift present.     Coordination: Romberg sign positive. Finger-Nose-Finger Test normal.     Gait: Gait abnormal.     Comments: Positive pronator drift on the left side.  Positive Romberg test.  Gait abnormality, patient ataxic due to difficulty moving the left leg.  She does have slightly decreased strength of the left leg when it is raised off the bed against gravity.  Strength of upper extremities equal.  Sensation intact x4     ED Results / Procedures /  Treatments   Labs (all labs ordered are  listed, but only abnormal results are displayed) Labs Reviewed  COMPREHENSIVE METABOLIC PANEL - Abnormal; Notable for the following components:      Result Value   Potassium 3.4 (*)    Glucose, Bld 200 (*)    Creatinine, Ser 1.20 (*)    Total Bilirubin 1.5 (*)    GFR, Estimated 53 (*)    All other components within normal limits  URINALYSIS, ROUTINE W REFLEX MICROSCOPIC - Abnormal; Notable for the following components:   Color, Urine STRAW (*)    Glucose, UA 50 (*)    Hgb urine dipstick SMALL (*)    Ketones, ur 20 (*)    Bacteria, UA RARE (*)    All other components within normal limits  I-STAT CHEM 8, ED - Abnormal; Notable for the following components:   Glucose, Bld 192 (*)    All other components within normal limits  CBG MONITORING, ED - Abnormal; Notable for the following components:   Glucose-Capillary 164 (*)    All other components within normal limits  RESP PANEL BY RT-PCR (FLU A&B, COVID) ARPGX2  ETHANOL  PROTIME-INR  APTT  CBC  DIFFERENTIAL  RAPID URINE DRUG SCREEN, HOSP PERFORMED    EKG EKG Interpretation  Date/Time:  Thursday July 27 2020 07:50:43 EST Ventricular Rate:  91 PR Interval:    QRS Duration: 79 QT Interval:  405 QTC Calculation: 499 R Axis:   -17 Text Interpretation: Sinus rhythm Probable left atrial enlargement Borderline left axis deviation Borderline T abnormalities, diffuse leads Borderline prolonged QT interval NSR, nonspecific t wave abnormalities Confirmed by Lavenia Atlas [phone]) on 07/27/2020 11:09:40 AM   Radiology DG Chest 2 View  Result Date: 07/25/2020 CLINICAL DATA:  Chest pain.  Palpitations. EXAM: CHEST - 2 VIEW COMPARISON:  05/19/2020 FINDINGS: The cardiomediastinal contours are normal. The lungs are clear. Pulmonary vasculature is normal. No consolidation, pleural effusion, or pneumothorax. No acute osseous abnormalities are seen. IMPRESSION: Negative radiographs of the chest.  Electronically Signed   By: Keith Rake M.D.   On: 07/25/2020 23:18   CT HEAD WO CONTRAST  Result Date: 07/27/2020 CLINICAL DATA:  Neuro deficit, acute stroke suspected. EXAM: CT HEAD WITHOUT CONTRAST TECHNIQUE: Contiguous axial images were obtained from the base of the skull through the vertex without intravenous contrast. COMPARISON:  None. FINDINGS: Brain: Abnormal hypodensity in the right basal ganglia extending into the overlying corona radiata, compatible with acute infarct which is better characterized on same day MRI (which is begun/in progress at the time of dictation). Mild edema without mass effect. No midline shift. Basal cisterns are patent. No acute hemorrhage. No hydrocephalus. No extra-axial fluid collection. No mass lesion. Vascular: Calcific atherosclerosis. No evidence of a hyperdense vessel. Skull: No acute fracture. Sinuses/Orbits: Visualized sinuses are clear. Other: No mastoid effusions. IMPRESSION: Acute infarct in the right basal ganglia, extending into overlying corona radiata. Mild edema without mass effect. Electronically Signed   By: Margaretha Sheffield MD   On: 07/27/2020 09:38   MR BRAIN WO CONTRAST  Result Date: 07/27/2020 CLINICAL DATA:  Neuro deficit, acute stroke suspected. EXAM: MRI HEAD WITHOUT CONTRAST TECHNIQUE: Multiplanar, multiecho pulse sequences of the brain and surrounding structures were obtained without intravenous contrast. COMPARISON:  Same day head CT. FINDINGS: Brain: Acute infarct involving the right corona radiata and right basal ganglia. Associated edema without mass effect. No acute hemorrhage. No hydrocephalus. No mass lesion. No midline shift. Basal cisterns are patent. No extra-axial fluid collection. Mild additional small T2/FLAIR hyperintensities, nonspecific but most likely  related to chronic microvascular ischemic disease. Vascular: Major arterial flow voids are maintained at the skull base. Skull and upper cervical spine: Normal marrow signal.  Sinuses/Orbits: Clear sinuses.  Bilateral posterior staphylomas. Other: No mastoid effusions. IMPRESSION: 1. 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 Associated edema without mass effect. 2. Bilateral posterior staphylomas. Electronically Signed   By: Margaretha Sheffield MD   On: 07/27/2020 09:57    Procedures Procedures   Medications Ordered in ED Medications - No data to display  ED Course  I have reviewed the triage vital signs and the nursing notes.  Pertinent labs & imaging results that were available during my care of the patient were reviewed by me and considered in my medical decision making (see chart for details).    MDM Rules/Calculators/A&P                          Patient presenting for evaluation of hypertension, lack of coordination of the left side, and right-sided headache.  On exam, patient does have concerning new neuro deficits including a positive pronator drift, positive Romberg, difficulty walking, and weakness of the left lower extremity.  Concern for stroke.  However this has been going on at least since yesterday, if not longer.  She is out of any TPA window.  Exam is not consistent with an LVO.  Will obtain labs, CT head, and MRI.  She does have risk factors for stroke and that she has had difficulty controlling her blood pressure recently, she has a history of diabetes with an A1c of 8.5.  CT head and MRI imaging consistent with stroke.  Labs interpreted by me, overall reassuring.  Electrolytes are stable.  She does have mild hyperglycemia of 200.  She had not taken her blood pressure medication today, however while in the ER, took her home medications.  We will continue to monitor her BP.  She will need neurology evaluation and likely admission to hospitalist service.  Discussed findings with patient and daughter, who are agreeable to plan.  Discussed with Dr. Tamala Julian from [facility] hospitalist service, patient to be admitted.  Discussed  with Dr. Curly Shores from neurology who will evaluate the patient an consult while patient is admitted.  Final Clinical Impression(s) / ED Diagnoses Final diagnoses:  Cerebrovascular accident (CVA), unspecified mechanism ([place])  Hypertension, unspecified type    Rx / DC Orders ED Discharge Orders    None       Franchot Heidelberg, PA-C 07/27/20 1218    Lorelle Gibbs, DO 07/28/20 [id number]

## 2020-07-27 NOTE — H&P (Signed)
History and Physical    Tina Horton JIR:[id number] DOB: 1965/09/16 DOA: 07/27/2020  Referring MD/NP/PA:  Franchot Heidelberg, PA-C PCP: Carlena Hurl, PA-C  Patient coming from: Home via EMS  Chief Complaint: Elevated blood pressure and heart racing  I have personally briefly reviewed patient's old medical records in [place]   HPI: Tina Horton is a 55 y.o.right-handed female with medical history significant of hypertension, hyperlipidemia, insulin-dependent diabetes mellitus, chronic kidney disease stage IIIa, and legally blind presents with complaints of elevated blood pressure and racing heart.  Patient reports it appears that she had Covid last year she has had intermittent spikes in her blood pressure and palpitations despite taking her blood pressure comparisons as advised.  She is been being followed by Dr. Alvester Chou in the outpatient setting and reports that Coreg was doubled back in December 2021.  Despite this change she persistently has the symptoms.  Over the last 2-3 days patient had noticed that her her coordination on her left side was off and that her leg was intermittently dragging the ground.  She had not had any facial droop, change in her speech, or sensation changes.  At baseline patient reports being legally blind and has central vision loss in the left eye and has floaters in her right eye visual field.  She had been seen in the emergency department on 3/8 due to palpitations and then followed up with Dr. Gwenlyn Found in the office yesterday.  Lab work from 3/7 revealed LDL 165 and Hemoglobin A1c 8.5. labs from 3/9 showed TSH 0.861 and free T4 pending.  He had ordered her a Zio patch to wear due to her reports of continued palpitations, but she had not received it yet.  This morning she reported feeling as though she was being pulled to her left side and could not keep her balance. She called EMS as she thought she may have had a stroke  ED Course: Upon admission  into the emergency department patient was seen to be afebrile with respirations 18-27, blood pressure 159/101-183/85.  Labs significant for glucose of 192 and INR 1.1.  COVID-19 screening was negative.  Urine drug screen was negative and alcohol level was undetectable.  CT scan of the brain showed an acute infarct of the right basal ganglia extending into the corona radiata with mild edema without mass-effect.  Review of Systems  Constitutional: Negative for fever.  HENT: Positive for congestion. Negative for sore throat.   Eyes: Positive for blurred vision.  Respiratory: Negative for shortness of breath.   Cardiovascular: Positive for palpitations. Negative for chest pain and leg swelling.  Gastrointestinal: Negative for abdominal pain, nausea and vomiting.  Genitourinary: Negative for dysuria and hematuria.  Musculoskeletal: Negative for falls.  Skin: Negative for rash.  Neurological: Positive for focal weakness and headaches. Negative for speech change and loss of consciousness.       Positive for gait disturbance  Psychiatric/Behavioral: Negative for memory loss and substance abuse.    Past Medical History:  Diagnosis Date  . Anemia   . Arthritis    knees  . Bronchitis   . Diabetes mellitus without complication [facility])    age 67yo  . GERD (gastroesophageal reflux disease)   . Glaucoma    Dr. Einar Gip  . H/O mammogram 2005  . Headache   . Heart murmur   . Hyperlipidemia   . Hypertension   . Intermittent palpitations   . Legally blind   . Myopia   .  Pneumonia   . Routine gynecological examination    last pap 2005  . Seasonal allergies   . Shortness of breath dyspnea   . Sinusitis   . Thyroid nodule     Past Surgical History:  Procedure Laterality Date  . CYSTECTOMY     umbilicus  . INCISION AND DRAINAGE     abdominal superficial abscess  . LAPAROSCOPIC VAGINAL HYSTERECTOMY WITH SALPINGECTOMY Bilateral 09/13/2015   Procedure: LAPAROSCOPIC ASSISTED VAGINAL  HYSTERECTOMY WITH SALPINGECTOMY, [facility]s Colpoplasty;  Surgeon: Thurnell Lose, MD;  Location: [facility];  Service: Gynecology;  Laterality: Bilateral;  . LASIK     x2     reports that she has never smoked. She has never used smokeless tobacco. She reports that she does not drink alcohol and does not use drugs.  Allergies  Allergen Reactions  . Amlodipine Swelling    Swelling   . Metformin And Related Diarrhea and Nausea And Vomiting  . Tanzeum [Albiglutide] Nausea And Vomiting  . Lisinopril Other (See Comments)    Refuses, says it causes cancer    Family History  Problem Relation Age of Onset  . Diabetes Mother   . Hypertension Mother   . Stroke Mother   . Hypertension Father   . Chronic Renal Failure Father   . Hypertension Sister   . Hypertension Brother   . Stroke Maternal Grandmother   . Asthma Daughter   . Healthy Daughter   . Healthy Son   . Cancer Neg Hx   . Heart disease Neg Hx     Prior to Admission medications   Medication Sig Start Date End Date Taking? Authorizing Provider  aspirin EC 81 MG tablet Take 1 tablet (81 mg total) by mouth daily. 11/30/19  Yes Tysinger, Camelia Eng, PA-C  carvedilol (COREG) 25 MG tablet Take 1 tablet (25 mg total) by mouth 2 (two) times daily. 07/26/20  Yes Lorretta Harp, MD  Cholecalciferol (DIALYVITE VITAMIN D 5000 PO) Take 5,000 Units by mouth 2 (two) times a week. Bid; f-t   Yes [provider]  dorzolamide-timolol (COSOPT) 22.3-6.8 MG/ML ophthalmic solution Place 1 drop into both eyes 2 times daily. 12/23/19 12/22/20 Yes [provider]  latanoprost (XALATAN) 0.005 % ophthalmic solution Place 1 drop into both eyes at bedtime.   Yes [provider]  LEVEMIR FLEXTOUCH 100 UNIT/ML FlexPen INJECT 40 UNITS SUBCUTANEOUSLY AT BEDTIME Patient taking differently: Inject into the skin at bedtime. 06/14/20  Yes Tysinger, Camelia Eng, PA-C  losartan (COZAAR) 100 MG tablet Take 1 tablet (100 mg total) by mouth daily. 12/07/19   Yes Tysinger, Camelia Eng, PA-C  NOVOLOG FLEXPEN 100 UNIT/ML FlexPen INJECT 20 UNITS SUBCUTANEOUSLY THREE TIMES DAILY WITH MEALS (USE SLIDING SCALE PROVIDED BY MD)(PEN EXPIRES AT 28 DAYS) Patient taking differently: Inject 16 Units into the skin 3 (three) times daily with meals. 03/31/20  Yes Tysinger, Camelia Eng, PA-C  Alcohol Swabs (B-D SINGLE USE SWABS REGULAR) PADS USE  WHEN  TESTING BLOOD SUGAR 06/02/20   Tysinger, Camelia Eng, PA-C  atorvastatin (LIPITOR) 40 MG tablet Take 1 tablet (40 mg total) by mouth daily. 07/26/20 10/24/20  Lorretta Harp, MD  Blood Glucose Monitoring Suppl (PRODIGY AUTOCODE BLOOD GLUCOSE) w/Device KIT  12/07/19   [provider]  glucose blood (PRODIGY NO CODING BLOOD GLUC) test strip Use as instructed 11/05/17   Tysinger, Camelia Eng, PA-C  Insulin Pen Needle (NOVOFINE PLUS) 32G X 4 MM MISC Use 4 times a day with Lantus Insulin 12/24/19  Tysinger, Camelia Eng, PA-C  meclizine (ANTIVERT) 25 MG tablet Take 1 tablet (25 mg total) by mouth 3 (three) times daily as needed for dizziness. Patient not taking: No sig reported 06/03/20   Domenic Moras, PA-C  PRODIGY NO CODING BLOOD GLUC test strip TEST BLOOD SUGAR THREE TIMES DAILY 05/26/20   Tysinger, Camelia Eng, PA-C  Prodigy Twist Top Lancets 28G MISC USE TO TEST BLOOD SUGAR THREE TIMES DAILY 05/26/20   Carlena Hurl, PA-C    Physical Exam:  Constitutional: NAD, calm, comfortable Vitals:   07/27/20 0815 07/27/20 1015 07/27/20 1112 07/27/20 1120  BP: (!) 159/101 (!) 178/86 (!) 166/95 (!) 183/85  Pulse: 95 83 84 85  Resp: 18 (!) 26 (!) 27 20  Temp:      TempSrc:      SpO2: 100% 100% 100% 100%   Eyes: PERRL, lids and conjunctivae normal ENMT: Mucous membranes are moist. Posterior pharynx clear of any exudate or lesions.Normal dentition.  Neck: normal, supple, no masses, no thyromegaly Respiratory: clear to auscultation bilaterally, no wheezing, no crackles. Normal respiratory effort. No accessory muscle use.  Cardiovascular: Regular  rate and rhythm, no murmurs / rubs / gallops. No extremity edema. 2+ pedal pulses. No carotid bruits.  Abdomen: no tenderness, no masses palpated. No hepatosplenomegaly. Bowel sounds positive.  Musculoskeletal: no clubbing / cyanosis. No joint deformity upper and lower extremities. Good ROM, no contractures. Normal muscle tone.  Skin: no rashes, lesions, ulcers. No induration Neurologic: CN 2-12 grossly intact. Sensation intact, DTR normal. Strength 5/5 in all 4.  Psychiatric: Normal judgment and insight. Alert and oriented x 3. Normal mood.     Labs on Admission: I have personally reviewed following labs and imaging studies  CBC: Recent Labs  Lab 07/25/20 2304 07/27/20 0837 07/27/20 1007  WBC 6.1 6.0  --   NEUTROABS  --  3.8  --   HGB 13.1 13.0 12.6  HCT 38.3 38.0 37.0  MCV 85.9 87.0  --   PLT 272 255  --    Basic Metabolic Panel: Recent Labs  Lab 07/24/20 1116 07/25/20 2304 07/27/20 0837 07/27/20 1007  NA 141 137 136 139  K 4.1 3.1* 3.4* 3.5  CL 99 100 98 99  CO2 $Re'21 23 25  'Mss$ --   GLUCOSE 150* 156* 200* 192*  BUN $Re'14 9 19 19  'mIO$ CREATININE 0.92 0.86 1.20* 1.00  CALCIUM 10.3* 9.8 9.9  --    GFR: Estimated Creatinine Clearance: 59.5 mL/min (by C-G formula based on SCr of 1 mg/dL). Liver Function Tests: Recent Labs  Lab 07/24/20 1116 07/27/20 0837  AST 19 19  ALT 18 16  ALKPHOS 86 71  BILITOT 1.0 1.5*  PROT 7.6 7.3  ALBUMIN 4.9 4.2   No results for input(s): LIPASE, AMYLASE in the last 168 hours. No results for input(s): AMMONIA in the last 168 hours. Coagulation Profile: Recent Labs  Lab 07/27/20 0837  INR 1.1   Cardiac Enzymes: No results for input(s): CKTOTAL, CKMB, CKMBINDEX, TROPONINI in the last 168 hours. BNP (last 3 results) No results for input(s): PROBNP in the last 8760 hours. HbA1C: No results for input(s): HGBA1C in the last 72 hours. CBG: Recent Labs  Lab 07/26/20 0139 07/27/20 1111  GLUCAP 151* 164*   Lipid Profile: No results for  input(s): CHOL, HDL, LDLCALC, TRIG, CHOLHDL, LDLDIRECT in the last 72 hours. Thyroid Function Tests: Recent Labs    07/26/20 1108  TSH 0.861  FREET4 WILL FOLLOW   Anemia Panel: No results  for input(s): VITAMINB12, FOLATE, FERRITIN, TIBC, IRON, RETICCTPCT in the last 72 hours. Urine analysis:    Component Value Date/Time   COLORURINE STRAW (A) 07/27/2020 0837   APPEARANCEUR CLEAR 07/27/2020 0837   LABSPEC 1.005 07/27/2020 0837   PHURINE 5.0 07/27/2020 0837   GLUCOSEU 50 (A) 07/27/2020 0837   HGBUR SMALL (A) 07/27/2020 [place] 07/27/2020 0837   BILIRUBINUR neg 05/28/2016 1233   KETONESUR 20 (A) 07/27/2020 0837   PROTEINUR NEGATIVE 07/27/2020 0837   UROBILINOGEN negative 05/28/2016 1233   NITRITE NEGATIVE 07/27/2020 0837   LEUKOCYTESUR NEGATIVE 07/27/2020 0837   Sepsis Labs: Recent Results (from the past 240 hour(s))  Resp Panel by RT-PCR (Flu A&B, Covid) Nasopharyngeal Swab     Status: None   Collection Time: 07/27/20  8:46 AM   Specimen: Nasopharyngeal Swab; Nasopharyngeal(NP) swabs in vial transport medium  Result Value Ref Range Status   SARS Coronavirus 2 by RT PCR NEGATIVE NEGATIVE Final    Comment: (NOTE) SARS-CoV-2 target nucleic acids are NOT DETECTED.  The SARS-CoV-2 RNA is generally detectable in upper respiratory specimens during the acute phase of infection. The lowest concentration of SARS-CoV-2 viral copies this assay can detect is 138 copies/mL. A negative result does not preclude SARS-Cov-2 infection and should not be used as the sole basis for treatment or other patient management decisions. A negative result may occur with  improper specimen collection/handling, submission of specimen other than nasopharyngeal swab, presence of viral mutation(s) within the areas targeted by this assay, and inadequate number of viral copies(<138 copies/mL). A negative result must be combined with clinical observations, patient history, and  epidemiological information. The expected result is Negative.  Fact Sheet for Patients:  EntrepreneurPulse.com.au  Fact Sheet for Healthcare Providers:  IncredibleEmployment.be  This test is no t yet approved or cleared by the [place] FDA and  has been authorized for detection and/or diagnosis of SARS-CoV-2 by FDA under an Emergency Use Authorization (EUA). This EUA will remain  in effect (meaning this test can be used) for the duration of the COVID-19 declaration under Section 564(b)(1) of the Act, 21 U.S.C.section 360bbb-3(b)(1), unless the authorization is terminated  or revoked sooner.       Influenza A by PCR NEGATIVE NEGATIVE Final   Influenza B by PCR NEGATIVE NEGATIVE Final    Comment: (NOTE) The Xpert Xpress SARS-CoV-2/FLU/RSV plus assay is intended as an aid in the diagnosis of influenza from Nasopharyngeal swab specimens and should not be used as a sole basis for treatment. Nasal washings and aspirates are unacceptable for Xpert Xpress SARS-CoV-2/FLU/RSV testing.  Fact Sheet for Patients: EntrepreneurPulse.com.au  Fact Sheet for Healthcare Providers: IncredibleEmployment.be  This test is not yet approved or cleared by the [place] FDA and has been authorized for detection and/or diagnosis of SARS-CoV-2 by FDA under an Emergency Use Authorization (EUA). This EUA will remain in effect (meaning this test can be used) for the duration of the COVID-19 declaration under Section 564(b)(1) of the Act, 21 U.S.C. section 360bbb-3(b)(1), unless the authorization is terminated or revoked.  Performed at [facility], [place] [address] [place], [place] [address]      Radiological Exams on Admission: DG Chest 2 View  Result Date: 07/25/2020 CLINICAL DATA:  Chest pain.  Palpitations. EXAM: CHEST - 2 VIEW COMPARISON:  05/19/2020 FINDINGS: The cardiomediastinal contours are normal. The lungs  are clear. Pulmonary vasculature is normal. No consolidation, pleural effusion, or pneumothorax. No acute osseous abnormalities are seen. IMPRESSION: Negative  radiographs of the chest. Electronically Signed   By: Keith Rake M.D.   On: 07/25/2020 23:18   CT HEAD WO CONTRAST  Result Date: 07/27/2020 CLINICAL DATA:  Neuro deficit, acute stroke suspected. EXAM: CT HEAD WITHOUT CONTRAST TECHNIQUE: Contiguous axial images were obtained from the base of the skull through the vertex without intravenous contrast. COMPARISON:  None. FINDINGS: Brain: Abnormal hypodensity in the right basal ganglia extending into the overlying corona radiata, compatible with acute infarct which is better characterized on same day MRI (which is begun/in progress at the time of dictation). Mild edema without mass effect. No midline shift. Basal cisterns are patent. No acute hemorrhage. No hydrocephalus. No extra-axial fluid collection. No mass lesion. Vascular: Calcific atherosclerosis. No evidence of a hyperdense vessel. Skull: No acute fracture. Sinuses/Orbits: Visualized sinuses are clear. Other: No mastoid effusions. IMPRESSION: Acute infarct in the right basal ganglia, extending into overlying corona radiata. Mild edema without mass effect. Electronically Signed   By: Margaretha Sheffield MD   On: 07/27/2020 09:38   MR BRAIN WO CONTRAST  Result Date: 07/27/2020 CLINICAL DATA:  Neuro deficit, acute stroke suspected. EXAM: MRI HEAD WITHOUT CONTRAST TECHNIQUE: Multiplanar, multiecho pulse sequences of the brain and surrounding structures were obtained without intravenous contrast. COMPARISON:  Same day head CT. FINDINGS: Brain: Acute infarct involving the right corona radiata and right basal ganglia. Associated edema without mass effect. No acute hemorrhage. No hydrocephalus. No mass lesion. No midline shift. Basal cisterns are patent. No extra-axial fluid collection. Mild additional small T2/FLAIR hyperintensities, nonspecific but  most likely related to chronic microvascular ischemic disease. Vascular: Major arterial flow voids are maintained at the skull base. Skull and upper cervical spine: Normal marrow signal. Sinuses/Orbits: Clear sinuses.  Bilateral posterior staphylomas. Other: No mastoid effusions. IMPRESSION: 1. 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 Associated edema without mass effect. 2. Bilateral posterior staphylomas. Electronically Signed   By: Margaretha Sheffield MD   On: 07/27/2020 09:57    EKG: Independently reviewed.  Sinus rhythm at 99 bpm with QTC 499  Assessment/Plan Cerebral vascular accident: Acute/Subacute.  Patient noted to have some discoordination of her left upper and lower extremity with weakness.  Imaging significant for acute infarct of the right corona radiata and right basal ganglia.  Risk factors include diabetes uncontrolled hemoglobin A1c 8.5 and hyperlipidemia with LDL 165. -Admit to telemetry bed -Stroke order set initiated -Neuro checks -Check RPR -MRA of the head with carotid Dopplers -PT/OT/Speech to eval and treat -Check echocardiogram -ASA and Plavix per neurology -Social work consult -Follow-up telemetry overnight -Patient had just recently been ordered a Zio patch by Dr. Gwenlyn Found -Appreciate neurology consultative services, will follow-up for any further recommendation  Hypertensive urgency: Acute.  Blood pressures elevated up to 183/85.  She reports compliance with home blood pressure medications home blood pressure medications include Coreg 25 mg once daily and losartan 100 mg daily. -Continue home regimen -Elevated blood pressures greater than 180  Acute kidney injury superimposed on chronic kidney disease stage II: Patient baseline creatinine appears to be around 0.8-0.9.  She presents with creatinine elevated up to 1.2 with BUN 19.  Patient reports a history of ureteral stenosis for which she is followed in outpatient setting by  nephrology. -Normal saline IV fluids at 75 mL/h for 1 L -Recheck kidney function in a.m.  Diabetes mellitus type 2 uncontrolled: Hemoglobin A1c was 8.5 on 3/7.  Home diabetic regimen includes Levemir 38 units nightly and 20 units with meals. -  Hypoglycemic protocol -Decrease Levemir 30 units nightly -CBGs before every meal with resistant SSI -Diabetic education consulted  Hyperlipidemia: Labs from 3/7 revealed LDL 165. -Goal LDL less than 70 -Continue atorvastatin 40 mg daily as this medication had just been recently prescribed  Thyroid nodule: Patient with history of thyroid nodule.  TSH 0.861 on 3/9. -Check free T4(elevated at 1.51)  -Added on and free T3 -May warrant further work-up in a.m.  DVT prophylaxis: Lovenox Code Status: Full Family Communication: Daughter updated at bedside Disposition Plan: Likely discharge home once medically stable Consults called: Neurology Admission status: Inpatient, require more than 2 midnights  Norval Morton MD [facility]   If 7PM-7AM, please contact night-coverage   07/27/2020, 12:02 PM

## 2020-07-27 NOTE — ED Triage Notes (Signed)
Pt BB [facility] for "high blood pressure and fast heart rate since I had Covid" EMS reports pt has abnormal gait and "falls when BP is up"

## 2020-07-27 NOTE — ED Notes (Signed)
Patient transported to MRI 

## 2020-07-28 ENCOUNTER — Inpatient Hospital Stay (HOSPITAL_BASED_OUTPATIENT_CLINIC_OR_DEPARTMENT_OTHER): Payer: Medicare HMO

## 2020-07-28 ENCOUNTER — Other Ambulatory Visit: Payer: Self-pay | Admitting: Internal Medicine

## 2020-07-28 ENCOUNTER — Other Ambulatory Visit: Payer: Self-pay | Admitting: Physician Assistant

## 2020-07-28 ENCOUNTER — Encounter (HOSPITAL_COMMUNITY): Payer: Self-pay | Admitting: Internal Medicine

## 2020-07-28 DIAGNOSIS — I10 Essential (primary) hypertension: Secondary | ICD-10-CM | POA: Diagnosis not present

## 2020-07-28 DIAGNOSIS — I6302 Cerebral infarction due to thrombosis of basilar artery: Secondary | ICD-10-CM

## 2020-07-28 DIAGNOSIS — N179 Acute kidney failure, unspecified: Secondary | ICD-10-CM | POA: Diagnosis not present

## 2020-07-28 DIAGNOSIS — N189 Chronic kidney disease, unspecified: Secondary | ICD-10-CM | POA: Diagnosis not present

## 2020-07-28 DIAGNOSIS — I639 Cerebral infarction, unspecified: Secondary | ICD-10-CM

## 2020-07-28 DIAGNOSIS — E782 Mixed hyperlipidemia: Secondary | ICD-10-CM | POA: Diagnosis not present

## 2020-07-28 DIAGNOSIS — I6389 Other cerebral infarction: Secondary | ICD-10-CM

## 2020-07-28 LAB — GLUCOSE, CAPILLARY
Glucose-Capillary: 178 mg/dL — ABNORMAL HIGH (ref 70–99)
Glucose-Capillary: 153 mg/dL — ABNORMAL HIGH (ref 70–99)
Glucose-Capillary: 170 mg/dL — ABNORMAL HIGH (ref 70–99)
Glucose-Capillary: 210 mg/dL — ABNORMAL HIGH (ref 70–99)
Glucose-Capillary: 187 mg/dL — ABNORMAL HIGH (ref 70–99)

## 2020-07-28 LAB — ECHOCARDIOGRAM COMPLETE
Area-P 1/2: 3.91 cm2
S' Lateral: 2.6 cm

## 2020-07-28 LAB — TSH+FREE T4
Free T4: 1.81 ng/dL — ABNORMAL HIGH (ref 0.82–1.77)
TSH: 0.861 u[IU]/mL (ref 0.450–4.500)

## 2020-07-28 LAB — RPR: RPR Ser Ql: NONREACTIVE

## 2020-07-28 MED ORDER — CLOPIDOGREL BISULFATE 75 MG PO TABS: 75.0000 mg | ORAL_TABLET | Freq: Every day | ORAL | 0 refills | Status: DC

## 2020-07-28 MED ORDER — ATORVASTATIN CALCIUM 80 MG PO TABS: 80.0000 mg | ORAL_TABLET | Freq: Every day | ORAL | 0 refills | Status: DC

## 2020-07-28 MED FILL — ATORVASTATIN CALCIUM 80 MG: 80 | 30 days supply | Qty: 30 | Fill #0

## 2020-07-28 MED FILL — CLOPIDOGREL 75 MG TABLET: 75 | 30 days supply | Qty: 30 | Fill #0

## 2020-07-28 NOTE — Discharge Instructions (Signed)
CBG 70 - 120: 0 units   CBG 121 - 150: 3 units   CBG 151 - 200: 4 units   CBG 201 - 250: 7 units   CBG 251 - 300: 11 units   CBG 301 - 350: 15 units   CBG 351 - 400: 20 units   CBG > 400 call MD

## 2020-07-28 NOTE — Progress Notes (Signed)
Inpatient Diabetes Program Recommendations  AACE/ADA: New Consensus Statement on Inpatient Glycemic Control (2015)  Target Ranges:  Prepandial:   less than 140 mg/dL      Peak postprandial:   less than 180 mg/dL (1-2 hours)      Critically ill patients:  140 - 180 mg/dL   Lab Results  Component Value Date   GLUCAP 187 (H) 07/28/2020   HGBA1C 8.5 (H) 07/24/2020    Spoke with pt at bedside regarding A1c and stroke. Pt discussed being on a plant based diet and has lost weight recently resulting in hypoglycemia when pt takes her insulin. Pt has not had her insulin in awhile. Spoke to pt about the need for her insulin and titrating insulin with her PCP. Discussed she may need a sliding scale at home instead of the high doses she is use to. Pt has brought down her A1c recently. Encouraged follow up and for her to continue to eat healthy and check her glucose trends before meals.  Thanks,  Tama Headings RN, MSN, BC-ADM Inpatient Diabetes Coordinator Team Pager [phone] (8a-5p)

## 2020-07-28 NOTE — Care Management CC44 (Signed)
Condition Code 44 Documentation Completed  Patient Details  Name: Tina Horton MRN: [id number] Date of Birth: [birth year]   Condition Code 44 given:  Yes Patient signature on Condition Code 44 notice:  Yes Documentation of 2 MD's agreement:  Yes Code 44 added to claim:  Yes    Marilu Favre, RN 07/28/2020, 3:01 PM

## 2020-07-28 NOTE — Progress Notes (Signed)
  Echocardiogram 2D Echocardiogram has been performed.  Tina Horton F 07/28/2020, 9:04 AM

## 2020-07-28 NOTE — Progress Notes (Signed)
D/C to home with daughter. LDA removed. DC instructions reviewed. Pt expressed understanding and recalled the steps to take if symptoms or questions arise.

## 2020-07-28 NOTE — Progress Notes (Signed)
STROKE TEAM PROGRESS NOTE  HPI from the initial consultation: Tina Horton is a 55 y.o. female who has been experiencing Left extremity weakness since about Monday. She is unable to describe her last known well since she has been having palpitations since December and equates this to her current symptoms. She endorses having the sensation of dragging her left leg while walking, especially on stairs which started at least 3 days ago, and clumsiness of the left arm at least since 3/9. She had acute worsening of left arm weakness when she awoke on the morning of 3/10. Denies any changes in sensation.  Patient and daughter deny noticing any facial droop or speech abnormalities. She is legally blind in both eyes including central vision loss of left eye and loss of peripheral vision on left side. She can make out lights and shadows/movements. Denies any recent changes in her visual acuity.  No history of personal or significant second hand smoke exposure. She has taken ASA 81mg  daily chronically for years other than a few missed doses.  Her cardiologist gave her atorvastatin 40mg  yesterday after elevated lipid evaluation- (total: 228, HDL 48, LDL 165) but she has not taken a dose yet.  She takes insulin typically but has not for several days due to low normal blood sugars. Additional medications she took this morning were carvedilol and losartan. Denies any bleeding rectally, oral, or vaginal. She had a hysterectomy 2016. Has no scheduled upcoming procedures. LKW: July 23, 2020? tPA given?: No, due to out of the window Premorbid modified rankin scale: 1     1 - No significant disability. Able to carry out all usual activities, despite some symptoms. (Significant baseline visual impairment)   INTERVAL HISTORY Patient seen and examined at bedside. Was working with physical therapy. Reports no new complaints.   Vitals:   07/27/20 2300 07/28/20 0006 07/28/20 0118 07/28/20 0356  BP: (!)  151/77 (!) 153/110 (!) 183/72 (!) 141/90  Pulse: 92 100 99 97  Resp: 18 20  18   Temp:  97.9 F (36.6 C)  97.9 F (36.6 C)  TempSrc:  Axillary  Axillary  SpO2: 100% 100% 99% 100%   CBC:  Recent Labs  Lab 07/25/20 2304 07/27/20 0837 07/27/20 1007  WBC 6.1 6.0  --   NEUTROABS  --  3.8  --   HGB 13.1 13.0 12.6  HCT 38.3 38.0 37.0  MCV 85.9 87.0  --   PLT 272 255  --    Basic Metabolic Panel:  Recent Labs  Lab 07/25/20 2304 07/27/20 0837 07/27/20 1007  NA 137 136 139  K 3.1* 3.4* 3.5  CL 100 98 99  CO2 23 25  --   GLUCOSE 156* 200* 192*  BUN 9 19 19   CREATININE 0.86 1.20* 1.00  CALCIUM 9.8 9.9  --    Lipid Panel:  Recent Labs  Lab 07/24/20 1116  CHOL 228*  TRIG 83  HDL 48  CHOLHDL 4.8*  LDLCALC 165*   HgbA1c:  Recent Labs  Lab 07/24/20 1116  HGBA1C 8.5*   Urine Drug Screen:  Recent Labs  Lab 07/27/20 0837  LABOPIA NONE DETECTED  COCAINSCRNUR NONE DETECTED  LABBENZ NONE DETECTED  AMPHETMU NONE DETECTED  THCU NONE DETECTED  LABBARB NONE DETECTED    Alcohol Level  Recent Labs  Lab 07/27/20 0837  ETH <10    IMAGING past 24 hours MR ANGIO HEAD WO CONTRAST  Result Date: 07/27/2020 CLINICAL DATA:  Follow-up acute infarction right deep brain.  EXAM: MRA HEAD WITHOUT CONTRAST TECHNIQUE: Angiographic images of the Circle of Willis were obtained using MRA technique without intravenous contrast. COMPARISON:  MRI same day FINDINGS: Both internal carotid arteries widely patent through the skull base and siphon regions. The anterior and middle cerebral vessels are patent without proximal stenosis, aneurysm or vascular malformation. Both vertebral arteries widely patent to the basilar. No basilar stenosis. Posterior circulation branch vessels are normal. IMPRESSION: Normal intracranial MR angiography. Electronically Signed   By: Nelson Chimes M.D.   On: 07/27/2020 19:00   VAS US CAROTID (at [facility] and [facility] only)  Result Date: 07/28/2020 Carotid Arterial Duplex Study  Indications:       CVA. Risk Factors:      Hypertension, hyperlipidemia, no history of smoking. Comparison Study:  No previous exams Performing Technologist: Vonzell Schlatter RVT  Examination Guidelines: A complete evaluation includes B-mode imaging, spectral Doppler, color Doppler, and power Doppler as needed of all accessible portions of each vessel. Bilateral testing is considered an integral part of a complete examination. Limited examinations for reoccurring indications may be performed as noted.  Right Carotid Findings: +----------+--------+--------+--------+------------------+--------+           PSV cm/sEDV cm/sStenosisPlaque DescriptionComments +----------+--------+--------+--------+------------------+--------+ CCA Prox  110     18                                         +----------+--------+--------+--------+------------------+--------+ CCA Distal100     24                                         +----------+--------+--------+--------+------------------+--------+ ICA Prox  82      18      1-39%   heterogenous               +----------+--------+--------+--------+------------------+--------+ ICA Distal111     33                                         +----------+--------+--------+--------+------------------+--------+ ECA       111     15                                         +----------+--------+--------+--------+------------------+--------+ +----------+--------+-------+--------+-------------------+           PSV cm/sEDV cmsDescribeArm Pressure (mmHG) +----------+--------+-------+--------+-------------------+ Subclavian230                                        +----------+--------+-------+--------+-------------------+ +---------+--------+--+--------+--+ VertebralPSV cm/s97EDV cm/s17 +---------+--------+--+--------+--+  Left Carotid Findings: +----------+--------+--------+--------+------------------+--------+           PSV cm/sEDV  cm/sStenosisPlaque DescriptionComments +----------+--------+--------+--------+------------------+--------+ CCA Prox  182     26                                         +----------+--------+--------+--------+------------------+--------+ CCA Distal121     24                                         +----------+--------+--------+--------+------------------+--------+  ICA Prox  101     25      1-39%   heterogenous               +----------+--------+--------+--------+------------------+--------+ ICA Distal75      24                                         +----------+--------+--------+--------+------------------+--------+ ECA       107     17                                         +----------+--------+--------+--------+------------------+--------+ +----------+--------+--------+--------+-------------------+           PSV cm/sEDV cm/sDescribeArm Pressure (mmHG) +----------+--------+--------+--------+-------------------+ [id number]                                         +----------+--------+--------+--------+-------------------+ +---------+--------+--+--------+--+ VertebralPSV cm/s88EDV cm/s17 +---------+--------+--+--------+--+   Summary: Right Carotid: Velocities in the right ICA are consistent with a 1-39% stenosis. Left Carotid: Velocities in the left ICA are consistent with a 1-39% stenosis. Vertebrals:  Bilateral vertebral arteries demonstrate antegrade flow. Subclavians: Normal flow hemodynamics were seen in bilateral subclavian              arteries. *See table(s) above for measurements and observations.     Preliminary     PHYSICAL EXAM Blood pressure (!) 141/90, pulse 97, temperature 97.9 F (36.6 C), temperature source Axillary, resp. rate 18, last menstrual period 09/13/2015, SpO2 100 %. General: Awake alert in no distress HEENT: Normocephalic atraumatic Lungs: Clear, with no rales Cardiovascular: Regular rate rhythm Abdomen: Soft nondistended  nontender Neurological exam Awake alert oriented x3 No dysarthria No aphasia Cranial nerve examination: Pupils are equal round reactive to light, her visual acuity is markedly reduced as she is legally blind at baseline, her left visual field is also shrunken-that is at baseline.  No ptosis.  Facial sensation symmetric.  Facial movement symmetric.  Uvula and palate raises midline.  Shoulder shrug intact.  Tongue midline. Motor exam: Left lower extremity 4/5-vertical drift noted.  Left upper extremity, right upper extremity and right lower extremity 5/5. Sensory exam: Intact Coordination: Mild dysmetria in left upper extremity.  Dysmetria in the left lower extremity not disproportionate weakness. Gait, cautious NIH stroke scale remains 2.   2D echocardiogram IMPRESSIONS    1. Left ventricular ejection fraction, by estimation, is 70 to 75%. The  left ventricle has hyperdynamic function. The left ventricle has no  regional wall motion abnormalities. Indeterminate diastolic filling due to  E-A fusion.  2. Intracavitary gradient with max instantaneous gradient of 19 mmHg.  3. Right ventricular systolic function is normal. The right ventricular  size is normal. Tricuspid regurgitation signal is inadequate for assessing  PA pressure.  4. The mitral valve is normal in structure. Trivial mitral valve  regurgitation. No evidence of mitral stenosis.  5. The aortic valve is grossly normal. Aortic valve regurgitation is not  visualized. No aortic stenosis is present.  6. The inferior vena cava is normal in size with greater than 50%  respiratory variability, suggesting right atrial pressure of 3 mmHg.    ASSESSMENT/PLAN [name] is a 55 y.o. female with history of type  2 diabetes, hyperlipidemia, hypertension, and chronic headaches presenting with complaints of left leg weakness and also clumsiness in the left arm.  Noted to have worsening of the symptoms over the past few  days which brought her to the hospital.  Last known well outside the window for IV TPA.  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  CT head with acute infarct in the right basal ganglia extending into the overlying corona radiata.  MRI confirmed acute/subacute stroke in the right posterior limb of internal capsule/corona radiata.  MRA head with no acute intracranial abnormality.  Carotid Dopplers-Bilateral carotids-ICAs-1 to 39% stenosis.  Bilateral vertebral arteries with antegrade flow.  2D Echo -completed.  See above.  LDL 165  HgbA1c 8.5  VTE prophylaxis -Lovenox    Diet   Diet Carb Modified Fluid consistency: Thin; Room service appropriate? Yes     aspirin 81 mg daily prior to admission, now on aspirin 81+ Plavix 75 for 3 weeks followed by aspirin only.  Therapy recommendations: Pending  Disposition: Pending  Although it is less likely that this is a embolic stroke, she has been having some palpitations-I would recommend outpatient 30-day cardiac monitoring   Hypertension . No need for permissive hypertension . Resume home medications and goal blood pressure on discharge normotensive.  Hyperlipidemia  Recently recommended to start atorvastatin 40.  I would start atorvastatin 80 for now.  LDL 165, goal < 70  High intensity statin atorvastatin 80  Continue statin at discharge  Diabetes type II Uncontrolled  HgbA1c 8.5, goal < 7.0  Management per primary team for goal less than 7.  CBGs Recent Labs    07/27/20 2253 07/28/20 0401 07/28/20 0625  GLUCAP 118* 178* 153*      SSI   Other Active Problems  AKI-management per primary team.  Hospital day # 1   Discussed with Dr. Sloan Leiter Stroke neurology will sign off inpatient.   Please follow-up with outpatient neurology-[facility] in 4 to 6 weeks- Dr. Leonie Man. -- Amie Portland, MD Stroke Neurology Pager: [phone]

## 2020-07-28 NOTE — Evaluation (Signed)
Physical Therapy Evaluation Patient Details Name: Tina Horton MRN: [id number] DOB: 1966/04/17 Today's Date: 07/28/2020   History of Present Illness  55 y.o. female presenting with elevated BP,  elevated HR, L sided weakness with decreased coordination and difficulty ambulating. MRI (+) R internal capsule/corona radiata CVA. PMHx significant for poorly controlled HTN, HLD, DMII Hx of COVID-19, & CKD IIIa. Patient is legally blind.  Clinical Impression   Pt presents with mild L weakness, mild L incoordation observed functionally, impaired gait with L list in hallway, and decreased activity tolerance vs baseline. Pt to benefit from acute PT to address deficits. Pt ambulated hallway distance with HHA and occasional min assist for balance and directing pt trajectory, pt also performed flight of steps with HHA and railing. PT recommending 24/7 assist from family upon initial d/c home, transitioning to PRN assist within a few days and per pt comfort level,  And recommending OPPT for higher level balance and strengthening intervention s/p CVA. PT reviewed s/s of CVA, pt understands. PT to progress mobility as tolerated, and will continue to follow acutely.      Follow Up Recommendations Outpatient PT;Supervision for mobility/OOB    Equipment Recommendations  None recommended by PT    Recommendations for Other Services       Precautions / Restrictions Precautions Precautions: Fall Precaution Comments: Patient is legally blind, able to see shapes/colors with R eye. Uses blind cane. Restrictions Weight Bearing Restrictions: No      Mobility  Bed Mobility Overal bed mobility: Needs Assistance Bed Mobility: Supine to Sit     Supine to sit: Supervision     General bed mobility comments: up in chair upon PT arrival to room    Transfers Overall transfer level: Needs assistance Equipment used: 1 person hand held assist Transfers: Sit to/from Stand Sit to Stand: Min guard Stand  pivot transfers: Min guard       General transfer comment: min guard for safety, pt utilizing HHA due to visual deficit and does not have walking stick with her  Ambulation/Gait Ambulation/Gait assistance: Min guard;Min assist Gait Distance (Feet): 275 Feet Assistive device: 1 person hand held assist Gait Pattern/deviations: Step-through pattern;Decreased stride length;Staggering left;Narrow base of support Gait velocity: decr   General Gait Details: min guard for safety and HHA for low vision; occasional min assist to steady and correct mild L lateral lean and list. Verbal cuing for moving towards middle of hallway  Stairs Stairs: Yes Stairs assistance: Min guard Stair Management: One rail Right;Forwards;Alternating pattern;Step to pattern Number of Stairs: 10 General stair comments: min guard for safety, HHA via L hand and pt with use of R rail. Verbal cuing for sequencing (up with the R leg leading, down with L leg leading if performing step-to gait)  Wheelchair Mobility    Modified Rankin (Stroke Patients Only) Modified Rankin (Stroke Patients Only) Pre-Morbid Rankin Score: Slight disability Modified Rankin: Moderately severe disability     Balance Overall balance assessment: Needs assistance Sitting-balance support: No upper extremity supported;Feet supported Sitting balance-Leahy Scale: Good     Standing balance support: Single extremity supported;During functional activity Standing balance-Leahy Scale: Poor Standing balance comment: reliant on external assist, occasional steadying and correcting L listing                             Pertinent Vitals/Pain Pain Assessment: No/denies pain    Home Living Family/patient expects to be discharged to:: Private residence Living Arrangements:  Spouse/significant other;Children Available Help at Discharge: Family;Available PRN/intermittently Type of Home: House Home Access: Stairs to enter Entrance  Stairs-Rails: Right;Left;Can reach both Entrance Stairs-Number of Steps: 4 Home Layout: One level Home Equipment: Other (comment) (blind cane)      Prior Function Level of Independence: Independent         Comments: Patient reports independence with ADLs including bathing at shower level. Husband and daughter assist with IADLs including cooking/cleaning and driving.     Hand Dominance   Dominant Hand: Right    Extremity/Trunk Assessment   Upper Extremity Assessment Upper Extremity Assessment: Defer to OT evaluation LUE Deficits / Details: AROM WFL. MMT grossly 4/5. Mild dysmetria noted. LUE Sensation: WNL LUE Coordination: decreased fine motor;decreased gross motor    Lower Extremity Assessment Lower Extremity Assessment: LLE deficits/detail LLE Deficits / Details: 4/5 hip flexion, knee flexion, 5/5 otherwise LLE Coordination: decreased gross motor (functionally;heel to shin test demonstrates no deficits)    Cervical / Trunk Assessment Cervical / Trunk Assessment: Normal  Communication   Communication: No difficulties  Cognition Arousal/Alertness: Awake/alert Behavior During Therapy: WFL for tasks assessed/performed Overall Cognitive Status: Within Functional Limits for tasks assessed                                        General Comments General comments (skin integrity, edema, etc.): HR post-ambulation 121 bpm    Exercises     Assessment/Plan    PT Assessment Patient needs continued PT services  PT Problem List Decreased strength;Decreased mobility;Decreased coordination;Decreased activity tolerance;Decreased balance;Decreased knowledge of use of DME;Cardiopulmonary status limiting activity       PT Treatment Interventions DME instruction;Therapeutic activities;Gait training;Therapeutic exercise;Patient/family education;Balance training;Stair training;Neuromuscular re-education;Functional mobility training    PT Goals (Current goals can be  found in the Care Plan section)  Acute Rehab PT Goals Patient Stated Goal: To return home. PT Goal Formulation: With patient Time For Goal Achievement: 08/11/20 Potential to Achieve Goals: Good    Frequency Min 4X/week   Barriers to discharge        Co-evaluation               AM-PAC PT "6 Clicks" Mobility  Outcome Measure Help needed turning from your back to your side while in a flat bed without using bedrails?: None Help needed moving from lying on your back to sitting on the side of a flat bed without using bedrails?: A Little Help needed moving to and from a bed to a chair (including a wheelchair)?: A Little Help needed standing up from a chair using your arms (e.g., wheelchair or bedside chair)?: A Little Help needed to walk in hospital room?: A Little Help needed climbing 3-5 steps with a railing? : A Little 6 Click Score: 19    End of Session   Activity Tolerance: Patient tolerated treatment well Patient left: in chair;with call bell/phone within reach (pt verbalizes she will press call button and wait for assist prior to mobility, pt able to press call button without PT assist) Nurse Communication: Mobility status PT Visit Diagnosis: Other abnormalities of gait and mobility (R26.89);Difficulty in walking, not elsewhere classified (R26.2);Hemiplegia and hemiparesis Hemiplegia - Right/Left: Left Hemiplegia - dominant/non-dominant: Non-dominant Hemiplegia - caused by: Cerebral infarction    Time: 1010-1026 PT Time Calculation (min) (ACUTE ONLY): 16 min   Charges:   PT Evaluation $PT Eval Low Complexity: 1 Low  Stacie Glaze, PT Acute Rehabilitation Services Pager [phone]  Office [phone]  Louis Matte 07/28/2020, 12:11 PM

## 2020-07-28 NOTE — Plan of Care (Signed)
  Problem: Education: Goal: Knowledge of patient specific risk factors addressed and post discharge goals established will improve Outcome: Progressing   Problem: Coping: Goal: Will verbalize positive feelings about self Outcome: Progressing   Problem: Health Behavior/Discharge Planning: Goal: Ability to manage health-related needs will improve Outcome: Progressing   Problem: Self-Care: Goal: Ability to communicate needs accurately will improve Outcome: Progressing   Problem: Nutrition: Goal: Risk of aspiration will decrease Outcome: Progressing

## 2020-07-28 NOTE — Care Management Obs Status (Signed)
[place] NOTIFICATION   Patient Details  Name: Tina Horton MRN: [id number] Date of Birth: April 30, 1966   Medicare Observation Status Notification Given:  Yes    Marilu Favre, RN 07/28/2020, 3:01 PM

## 2020-07-28 NOTE — Progress Notes (Signed)
Carotid US completed    Please see CV Proc for preliminary results.   Rahel Carlton, RVT  

## 2020-07-28 NOTE — Evaluation (Signed)
Speech Language Pathology Evaluation Patient Details Name: Tina Horton MRN: [id number] DOB: 1965-05-24 Today's Date: 07/28/2020 Time: 4580-9983 SLP Time Calculation (min) (ACUTE ONLY): 13 min  Problem List:  Patient Active Problem List   Diagnosis Date Noted  . CVA (cerebral vascular accident) ([place]) 07/27/2020  . Acute kidney injury superimposed on chronic kidney disease ([place]) 07/27/2020  . Palpitation 07/24/2020  . History of COVID-19 05/22/2020  . COVID-19 virus infection 02/25/2020  . Sinus pressure 02/25/2020  . Acute non-recurrent frontal sinusitis 02/25/2020  . Chronic pain of both knees 01/04/2020  . 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 CKD (chronic kidney disease) stage 2, GFR 60-89 ml/min 11/29/2019  . 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 Vitamin D deficiency 03/13/2018  . Primary osteoarthritis of both knees 01/20/2018  . DDD (degenerative disc disease), lumbar 01/20/2018  . Localized swelling, mass or lump of neck 10/30/2017  . Microalbuminuria 08/26/2017  . Mass of neck 08/26/2017  . Screening for cancer 08/20/2017  . Left leg pain 08/20/2017  . Respiratory tract infection 08/20/2017  . Nausea 08/20/2017  . Renal cyst 01/15/2016  . Hydronephrosis of left kidney 01/15/2016  . Spinal stenosis 01/15/2016  . History of burning pain in leg 12/04/2015  . S/P laparoscopic assisted vaginal hysterectomy (LAVH) 09/13/2015  . Encounter for health maintenance examination in adult 08/21/2015  . Hyperlipidemia 08/21/2015  . Glaucoma 08/21/2015  . Heart murmur 08/21/2015  . Thyroid nodule 08/21/2015  . Vaccine refused by patient 08/21/2015  . Noncompliance 03/21/2015  . Essential hypertension 03/21/2015  . Uncontrolled diabetes mellitus with complication, with long-term current use of insulin ([place]) 03/21/2015  . Lymphadenitis 03/21/2015  . SKIN LESION 10/19/2008  . SYSTOLIC MURMUR 38/25/0539  . GLAUCOMA 07/17/2006  . VISUAL DISTURBANCE NOS 07/17/2006  . REFLUX  ESOPHAGITIS 07/17/2006   Past Medical History:  Past Medical History:  Diagnosis Date  . Anemia   . Arthritis    knees  . Bronchitis   . Diabetes mellitus without complication [facility])    age 29yo  . GERD (gastroesophageal reflux disease)   . Glaucoma    Dr. Einar Gip  . H/O mammogram 2005  . Headache   . Heart murmur   . Hyperlipidemia   . Hypertension   . Intermittent palpitations   . Legally blind   . Myopia   . Pneumonia   . Routine gynecological examination    last pap 2005  . Seasonal allergies   . Shortness of breath dyspnea   . Sinusitis   . Thyroid nodule    Past Surgical History:  Past Surgical History:  Procedure Laterality Date  . CYSTECTOMY     umbilicus  . INCISION AND DRAINAGE     abdominal superficial abscess  . LAPAROSCOPIC VAGINAL HYSTERECTOMY WITH SALPINGECTOMY Bilateral 09/13/2015   Procedure: LAPAROSCOPIC ASSISTED VAGINAL HYSTERECTOMY WITH SALPINGECTOMY, [facility] Colpoplasty;  Surgeon: Thurnell Lose, MD;  Location: [facility];  Service: Gynecology;  Laterality: Bilateral;  . LASIK     x2   HPI:  55 y.o. female presenting with elevated BP,  elevated HR, L sided weakness with decreased coordination and difficulty ambulating. MRI (+) R internal capsule/corona radiata CVA. PMHx significant for poorly controlled HTN, HLD, DMII Hx of COVID-19, & CKD IIIa. Patient is legally blind.   Assessment / Plan / Recommendation Clinical Impression  Pt participated in cognitive-linguistic assessment - demonstrates fluent, clear speech without dysarthria; normal receptive/expressive language;  memory, attention, and executive functioning WNL. There are no communication needs.  Our service will sign  off. No f/u SLP warranted.    SLP Assessment  SLP Recommendation/Assessment: Patient does not need any further Speech Lanaguage Pathology Services SLP Visit Diagnosis: Cognitive communication deficit (R41.841)    Follow Up Recommendations  None    Frequency and Duration      n/a      SLP Evaluation Cognition  Overall Cognitive Status: Within Functional Limits for tasks assessed Arousal/Alertness: Awake/alert Orientation Level: Oriented X4 Attention: Alternating Alternating Attention: Appears intact Memory: Appears intact Awareness: Appears intact Executive Function: Reasoning Reasoning: Appears intact Safety/Judgment: Appears intact       Comprehension  Auditory Comprehension Overall Auditory Comprehension: Appears within functional limits for tasks assessed Reading Comprehension Reading Status: Unable to assess (comment)    Expression Expression Primary Mode of Expression: Verbal Verbal Expression Overall Verbal Expression: Appears within functional limits for tasks assessed Written Expression Dominant Hand: Right   Oral / Motor  Oral Motor/Sensory Function Overall Oral Motor/Sensory Function: Within functional limits Motor Speech Overall Motor Speech: Appears within functional limits for tasks assessed   GO                    Juan Quam Laurice 07/28/2020, 1:31 PM  Madilynne Mullan L. Tivis Ringer, [place] Office number [phone] Pager [phone]

## 2020-07-28 NOTE — Evaluation (Signed)
Occupational Therapy Evaluation Patient Details Name: Tina Horton MRN: [id number] DOB: Oct 23, 1965 Today's Date: 07/28/2020    History of Present Illness 55 y.o. female presenting with elevated BP,  elevated HR, L sided weakness with decreased coordination and difficulty ambulating. MRI (+) R internal capsule/corona radiata CVA. PMHx significant for poorly controlled HTN, HLD, DMII Hx of COVID-19, & CKD IIIa. Patient is legally blind.   Clinical Impression   PTA patient was living with her spouse and daughter in a private residence and was grossly Mod I with ADLs using blind cane. Patient reports family assists with IADLs including cooking/cleaning and transportation. Patient currently functioning below baseline requiring Min guard grossly for observed ADLs with cues 2/2 low vision. Patient also limited by deficits listed below 2/2 diagnosis above and would benefit from continued acute OT services to maximize safety and independence with self-car tasks. Patient with desire to d/c to her daughters home who lives in a 2nd floor apartment with full-flight of steps and bilateral rails, tub/shower combo and standard height toilet as daughters home "has more room". Patient would benefit from return to her own home as it is a familiar environment given balance deficits 2/2 CVA and need for Min guard with all functional transfers and mobility. Recommendation for OPOT and initial 24hr supervision/assist.     Follow Up Recommendations  Outpatient OT;Supervision/Assistance - 24 hour    Equipment Recommendations  3 in 1 bedside commode    Recommendations for Other Services       Precautions / Restrictions Precautions Precautions: Fall Precaution Comments: Patient is legally blind. Uses blind cane. Restrictions Weight Bearing Restrictions: No      Mobility Bed Mobility Overal bed mobility: Needs Assistance Bed Mobility: Supine to Sit     Supine to sit: Supervision     General bed  mobility comments: Supervision A for safety. No external assist required.    Transfers Overall transfer level: Needs assistance Equipment used: 1 person hand held assist Transfers: Sit to/from [facility] Sit to Stand: Min guard Stand pivot transfers: Min guard       General transfer comment: Min guard with LUE HHA and cues to widen BOS. Min guard for stand-pivot transfers with cues 2/2 low vision.    Balance Overall balance assessment: Needs assistance Sitting-balance support: No upper extremity supported;Feet supported Sitting balance-Leahy Scale: Good     Standing balance support: Single extremity supported;During functional activity Standing balance-Leahy Scale: Poor Standing balance comment: Reliant on at least unilateral external UE support.                           ADL either performed or assessed with clinical judgement   ADL Overall ADL's : Needs assistance/impaired     Grooming: Min guard;Standing Grooming Details (indicate cue type and reason): Min guard for steadying. Cues 2/2 low vision.         Upper Body Dressing : Set up;Standing Upper Body Dressing Details (indicate cue type and reason): Able to don posterior hospital gown seated EOB. Lower Body Dressing: Min guard;Sit to/from stand Lower Body Dressing Details (indicate cue type and reason): Min guard for steadying. Patient with very narrow BOS. Able to correct with cues. Toilet Transfer: Magazine features editor Details (indicate cue type and reason): Simulated with transfer to recliner. Min guard for steadying. Cues 2/2 low vision.         Functional mobility during ADLs: Min guard General ADL Comments: Patient limited  by mild LUE hemiparesis, decreased coordination, and decreased balance.     Vision Baseline Vision/History: Legally blind Patient Visual Report: No change from baseline (Patient born with degenerative eye disease. Has been using blind cane for last 5  years.) Vision Assessment?: No apparent visual deficits     Perception     Praxis      Pertinent Vitals/Pain Pain Assessment: No/denies pain     Hand Dominance Right   Extremity/Trunk Assessment Upper Extremity Assessment Upper Extremity Assessment: LUE deficits/detail LUE Deficits / Details: AROM WFL. MMT grossly 4/5. Mild dysmetria noted. LUE Sensation: WNL LUE Coordination: decreased fine motor;decreased gross motor   Lower Extremity Assessment Lower Extremity Assessment: Defer to PT evaluation   Cervical / Trunk Assessment Cervical / Trunk Assessment: Normal   Communication Communication Communication: No difficulties   Cognition Arousal/Alertness: Awake/alert Behavior During Therapy: WFL for tasks assessed/performed Overall Cognitive Status: Within Functional Limits for tasks assessed                                     General Comments       Exercises     Shoulder Instructions      Home Living Family/patient expects to be discharged to:: Private residence Living Arrangements: Spouse/significant other;Children Available Help at Discharge: Family;Available PRN/intermittently Type of Home: House Home Access: Stairs to enter [employer] of Steps: 4 Entrance Stairs-Rails: Right;Left;Can reach both Home Layout: One level     Bathroom Shower/Tub: Teacher, early years/pre: Standard     Home Equipment: Other (comment) (blind cane)          Prior Functioning/Environment Level of Independence: Independent        Comments: Patient reports independence with ADLs including bathing at shower level. Husband and daughter assist with IADLs including cooking/cleaning and driving.        OT Problem List: Decreased strength;Impaired balance (sitting and/or standing);Impaired vision/perception;Decreased coordination;Decreased knowledge of use of DME or AE;Impaired UE functional use      OT Treatment/Interventions:  Self-care/ADL training;Therapeutic exercise;Neuromuscular education;Energy conservation;DME and/or AE instruction;Therapeutic activities;Patient/family education;Balance training;Visual/perceptual remediation/compensation    OT Goals(Current goals can be found in the care plan section) Acute Rehab OT Goals Patient Stated Goal: To return home. OT Goal Formulation: With patient Time For Goal Achievement: 08/11/20 Potential to Achieve Goals: Good ADL Goals Pt/caregiver will Perform Home Exercise Program: Left upper extremity;Independently Additional ADL Goal #1: Patient will complete all ADLs and ADL tranfers with supervision A for safety with good bimanual manipulation.  OT Frequency: Min 2X/week   Barriers to D/C:            Co-evaluation              AM-PAC OT "6 Clicks" Daily Activity     Outcome Measure Help from another person eating meals?: A Little Help from another person taking care of personal grooming?: A Little Help from another person toileting, which includes using toliet, bedpan, or urinal?: A Little Help from another person bathing (including washing, rinsing, drying)?: A Little Help from another person to put on and taking off regular upper body clothing?: A Little Help from another person to put on and taking off regular lower body clothing?: A Little 6 Click Score: 18   End of Session Equipment Utilized During Treatment: Gait belt Nurse Communication: Mobility status  Activity Tolerance: Patient tolerated treatment well Patient left: in chair;with call bell/phone within  reach;Other (comment) (Handoff to PT staff.)  OT Visit Diagnosis: Unsteadiness on feet (R26.81);Other abnormalities of gait and mobility (R26.89);Muscle weakness (generalized) (M62.81);Hemiplegia and hemiparesis Hemiplegia - Right/Left: Left Hemiplegia - dominant/non-dominant: Non-Dominant Hemiplegia - caused by: Cerebral infarction                Time: 0940-1007 OT Time Calculation  (min): 27 min Charges:  OT General Charges $OT Visit: 1 Visit OT Evaluation $OT Eval Low Complexity: 1 Low OT Treatments $Self Care/Home Management : 8-22 mins  Caniya Tagle H. OTR/L Supplemental OT, Department of rehab services [phone]  Blake Vetrano R H. 07/28/2020, 10:39 AM

## 2020-07-28 NOTE — TOC Progression Note (Signed)
Transition of Care [facility]) - Progression Note    Patient Details  Name: Tina Horton MRN: [id number] Date of Birth: 05/01/1966  [facility] CM/SW Contact  Jacalyn Lefevre Edson Snowball, RN Phone Number: 07/28/2020, 2:57 PM  Clinical Narrative:     Spoke to patient at bedside. She will be staying with her daughter at discharge. Daughter lives in [place] however patient not sure of address.   Discussed [facility] at [facility], confirmed patient's contact number, they will call her for appointment. Information placed on AVS.   Patient does need 3 in 1 ordered with [employer] will come to hospital room.     Expected Discharge Plan: Home/Self Care Barriers to Discharge: No Barriers Identified  Expected Discharge Plan and Services Expected Discharge Plan: Home/Self Care     Post Acute Care Choice:  (OP PT)   Expected Discharge Date: 07/28/20               DME Arranged: 3-N-1 DME Agency: [employer] Date DME Agency Contacted: 07/28/20 Time DME Agency Contacted: [phone] Representative spoke with at DME Agency: [place] Arranged: NA           Social Determinants of Health ([place]) Interventions    Readmission Risk Interventions No flowsheet data found.

## 2020-07-28 NOTE — Discharge Summary (Signed)
PATIENT DETAILS Name: Tina Horton Age: 55 y.o. Sex: female Date of Birth: November 01, 1965 MRN: [id number]. Admitting Physician: Norval Morton, MD WUJ:[id number], Camelia Eng, PA-C  Admit Date: 07/27/2020 Discharge date: 07/28/2020  Recommendations for Outpatient Follow-up:  1. Follow up with PCP in 1-2 weeks 2. Please obtain CMP/CBC in one week 3. Needs 30-day outpatient cardiac monitoring-epic referral sent to cardiology. 4. Please ensure outpatient follow-up with cardiology, neurology 5. Aspirin/Plavix x3 weeks-followed by aspirin only 6. History of thyroid nodule-TSH within normal limits-further work-up deferred to the outpatient setting-PCP to follow.  Admitted From:  Home   Disposition: [place]: No  Equipment/Devices: None  Discharge Condition: Stable  CODE STATUS: FULL CODE  Diet recommendation:  Diet Order            Diet - low sodium heart healthy           Diet Carb Modified           Diet Carb Modified Fluid consistency: Thin; Room service appropriate? Yes  Diet effective now                  Brief Summary: See H&P, Labs, Consult and Test reports for all details in brief, patient is a 55 year old female with history of DM-2, HTN, HLD, chronic headaches-we will presented with left arms/left leg weakness/clumsiness-was found to have acute CVA.  See below for further details  Brief Hospital Course: Acute CVA: Presented with very mild left-sided weakness that is now improving-MRI brain showing right basal ganglia/corona radiata infarct-echo with preserved EF, MRA brain without any major stenosis, carotid Doppler without significant stenosis, LDL 165, A1c 8.5.  Telemetry was negative for A. fib.  Evaluated by PT with recommendations for outpatient physical therapy.  Evaluated by neurology with recommendations for aspirin/Plavix x3 weeks followed by aspirin only.  Electronic referral sent to cardiology for outpatient cardiac 30-day  monitoring.  DM-2: Resume Levemir-further optimization deferred to the outpatient setting.  HTN: Initially permissive hypertension was allowed-outpatient cardiology had just adjusted her BP medications-for now we will continue with losartan/Coreg at her current dosing-and have her follow-up with primary care practitioner/primary cardiologist for further optimization.  HLD: Continue high intensity statin on discharge-previously on 40 mg of Lipitor-dosage has been increased to 80 mg.  Mild AKI: Likely hemodynamically mediated-resolved  Thyroid nodule: TSH within normal limits-further work-up deferred to outpatient setting   Discharge Diagnoses:  Principal Problem:   CVA (cerebral vascular accident) ([place]) Active Problems:   Uncontrolled diabetes mellitus with complication, with long-term current use of insulin ([place])   Hyperlipidemia   Thyroid nodule   Acute kidney injury superimposed on chronic kidney disease [facility])   Discharge Instructions:  Activity:  As tolerated  Discharge Instructions    Ambulatory referral to Neurology   Complete by: As directed    An appointment is requested in approximately: 4 weeks   Call MD for:  difficulty breathing, headache or visual disturbances   Complete by: As directed    Call MD for:  persistant dizziness or light-headedness   Complete by: As directed    Diet - low sodium heart healthy   Complete by: As directed    Diet Carb Modified   Complete by: As directed    Discharge instructions   Complete by: As directed    Follow with Primary MD  Tysinger, Camelia Eng, PA-C in 1-2 weeks  Please follow with your primary cardiologist Dr. Alvester Chou in the next 2 weeks.  Please  follow with neurology-they will call you with a follow-up appointment-if you do not hear from them-please give them a call  Cardiologist office will give you a call-and set up 30-day outpatient cardiac monitoring.  You will be on aspirin and Plavix for 3 weeks, after 3 weeks stop  Plavix and continue on aspirin  Please get a complete blood count and chemistry panel checked by your Primary MD at your next visit, and again as instructed by your Primary MD.  Get Medicines reviewed and adjusted: Please take all your medications with you for your next visit with your Primary MD  Laboratory/radiological data: Please request your Primary MD to go over all hospital tests and procedure/radiological results at the follow up, please ask your Primary MD to get all Hospital records sent to his/her office.  In some cases, they will be blood work, cultures and biopsy results pending at the time of your discharge. Please request that your primary care M.D. follows up on these results.  Also Note the following: If you experience worsening of your admission symptoms, develop shortness of breath, life threatening emergency, suicidal or homicidal thoughts you must seek medical attention immediately by calling 911 or calling your MD immediately  if symptoms less severe.  You must read complete instructions/literature along with all the possible adverse reactions/side effects for all the Medicines you take and that have been prescribed to you. Take any new Medicines after you have completely understood and accpet all the possible adverse reactions/side effects.   Do not drive when taking Pain medications or sleeping medications (Benzodaizepines)  Do not take more than prescribed Pain, Sleep and Anxiety Medications. It is not advisable to combine anxiety,sleep and pain medications without talking with your primary care practitioner  Special Instructions: If you have smoked or chewed Tobacco  in the last 2 yrs please stop smoking, stop any regular Alcohol  and or any Recreational drug use.  Wear Seat belts while driving.  Please note: You were cared for by a hospitalist during your hospital stay. Once you are discharged, your primary care physician will handle any further medical issues.  Please note that NO REFILLS for any discharge medications will be authorized once you are discharged, as it is imperative that you return to your primary care physician (or establish a relationship with a primary care physician if you do not have one) for your post hospital discharge needs so that they can reassess your need for medications and monitor your lab values.   Increase activity slowly   Complete by: As directed      Allergies as of 07/28/2020      Reactions   Amlodipine Swelling   Swelling   Metformin And Related Diarrhea, Nausea And Vomiting   Tanzeum [albiglutide] Nausea And Vomiting   Lisinopril Other (See Comments)   Refuses, says it causes cancer      Medication List    STOP taking these medications   meclizine 25 MG tablet Commonly known as: ANTIVERT     TAKE these medications   aspirin EC 81 MG tablet Take 1 tablet (81 mg total) by mouth daily.   atorvastatin 80 MG tablet Commonly known as: LIPITOR Take 1 tablet (80 mg total) by mouth daily. What changed:   medication strength  how much to take   B-D SINGLE USE SWABS REGULAR Pads USE  WHEN  TESTING BLOOD SUGAR   carvedilol 25 MG tablet Commonly known as: COREG Take 1 tablet (25 mg total) by mouth 2 (  two) times daily.   cholecalciferol 25 MCG (1000 UNIT) tablet Commonly known as: VITAMIN D3 Take 1,000 Units by mouth See admin instructions. Take 1 tablet (1000 units totally) by mouth every day; except take 5000 units totally by mouth on Tuesday and Friday   clopidogrel 75 MG tablet Commonly known as: Plavix Take 1 tablet (75 mg total) by mouth daily.   dorzolamide-timolol 22.3-6.8 MG/ML ophthalmic solution Commonly known as: COSOPT Place 1 drop into both eyes 2 (two) times daily.   glucose blood test strip Commonly known as: Prodigy No Coding Blood Gluc Use as instructed   Prodigy No Coding Blood Gluc test strip Generic drug: glucose blood TEST BLOOD SUGAR THREE TIMES DAILY   latanoprost  0.005 % ophthalmic solution Commonly known as: XALATAN Place 1 drop into both eyes at bedtime.   Levemir FlexTouch 100 UNIT/ML FlexPen Generic drug: insulin detemir INJECT 40 UNITS SUBCUTANEOUSLY AT BEDTIME What changed: See the new instructions.   losartan 100 MG tablet Commonly known as: Cozaar Take 1 tablet (100 mg total) by mouth daily.   NovoFine Plus 32G X 4 MM Misc Generic drug: Insulin Pen Needle Use 4 times a day with Lantus Insulin   NovoLOG FlexPen 100 UNIT/ML FlexPen Generic drug: insulin aspart INJECT 20 UNITS SUBCUTANEOUSLY THREE TIMES DAILY WITH MEALS (USE SLIDING SCALE PROVIDED BY MD)(PEN EXPIRES AT 28 DAYS) What changed: See the new instructions.   Prodigy Autocode Blood Glucose w/Device Kit   Prodigy Twist Top Lancets 28G Misc USE TO TEST BLOOD SUGAR THREE TIMES DAILY       Follow-up Information    Carlena Hurl, PA-C. Schedule an appointment as soon as possible for a visit in 1 week(s).   Specialty: Family Medicine Contact information: [place] [address] [phone]        Lorretta Harp, MD. Schedule an appointment as soon as possible for a visit in 2 week(s).   Specialties: Cardiology, Radiology Contact information: [address] [place] [address] [phone]        [place]. Schedule an appointment as soon as possible for a visit in 1 month(s).   Why: You will get a call from them-if you do not hear from them-please give them a call and ask for an appointment. Contact information: [address]     [address] [place] [address] [phone]             Allergies  Allergen Reactions  . Amlodipine Swelling    Swelling   . Metformin And Related Diarrhea and Nausea And Vomiting  . Tanzeum [Albiglutide] Nausea And Vomiting  . Lisinopril Other (See Comments)    Refuses, says it causes cancer    Consultations:   neurology   Other  Procedures/Studies: DG Chest 2 View  Result Date: 07/25/2020 CLINICAL DATA:  Chest pain.  Palpitations. EXAM: CHEST - 2 VIEW COMPARISON:  05/19/2020 FINDINGS: The cardiomediastinal contours are normal. The lungs are clear. Pulmonary vasculature is normal. No consolidation, pleural effusion, or pneumothorax. No acute osseous abnormalities are seen. IMPRESSION: Negative radiographs of the chest. Electronically Signed   By: Keith Rake M.D.   On: 07/25/2020 23:18   CT HEAD WO CONTRAST  Result Date: 07/27/2020 CLINICAL DATA:  Neuro deficit, acute stroke suspected. EXAM: CT HEAD WITHOUT CONTRAST TECHNIQUE: Contiguous axial images were obtained from the base of the skull through the vertex without intravenous contrast. COMPARISON:  None. FINDINGS: Brain: Abnormal hypodensity in the right basal  ganglia extending into the overlying corona radiata, compatible with acute infarct which is better characterized on same day MRI (which is begun/in progress at the time of dictation). Mild edema without mass effect. No midline shift. Basal cisterns are patent. No acute hemorrhage. No hydrocephalus. No extra-axial fluid collection. No mass lesion. Vascular: Calcific atherosclerosis. No evidence of a hyperdense vessel. Skull: No acute fracture. Sinuses/Orbits: Visualized sinuses are clear. Other: No mastoid effusions. IMPRESSION: Acute infarct in the right basal ganglia, extending into overlying corona radiata. Mild edema without mass effect. Electronically Signed   By: Feliberto Harts MD   On: 07/27/2020 09:38   MR ANGIO HEAD WO CONTRAST  Result Date: 07/27/2020 CLINICAL DATA:  Follow-up acute infarction right deep brain. EXAM: MRA HEAD WITHOUT CONTRAST TECHNIQUE: Angiographic images of the Circle of Willis were obtained using MRA technique without intravenous contrast. COMPARISON:  MRI same day FINDINGS: Both internal carotid arteries widely patent through the skull base and siphon regions. The anterior and middle  cerebral vessels are patent without proximal stenosis, aneurysm or vascular malformation. Both vertebral arteries widely patent to the basilar. No basilar stenosis. Posterior circulation branch vessels are normal. IMPRESSION: Normal intracranial MR angiography. Electronically Signed   By: Paulina Fusi M.D.   On: 07/27/2020 19:00   MR BRAIN WO CONTRAST  Result Date: 07/27/2020 CLINICAL DATA:  Neuro deficit, acute stroke suspected. EXAM: MRI HEAD WITHOUT CONTRAST TECHNIQUE: Multiplanar, multiecho pulse sequences of the brain and surrounding structures were obtained without intravenous contrast. COMPARISON:  Same day head CT. FINDINGS: Brain: Acute infarct involving the right corona radiata and right basal ganglia. Associated edema without mass effect. No acute hemorrhage. No hydrocephalus. No mass lesion. No midline shift. Basal cisterns are patent. No extra-axial fluid collection. Mild additional small T2/FLAIR hyperintensities, nonspecific but most likely related to chronic microvascular ischemic disease. Vascular: Major arterial flow voids are maintained at the skull base. Skull and upper cervical spine: Normal marrow signal. Sinuses/Orbits: Clear sinuses.  Bilateral posterior staphylomas. Other: No mastoid effusions. IMPRESSION: 1. 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 Associated edema without mass effect. 2. Bilateral posterior staphylomas. Electronically Signed   By: Feliberto Harts MD   On: 07/27/2020 09:57   ECHOCARDIOGRAM COMPLETE  Result Date: 07/28/2020    ECHOCARDIOGRAM REPORT   Patient Name:   Tina Horton Date of Exam: 07/28/2020 Medical Rec #:  [id number]      Height:       66.0 in Accession #:    [id number]     Weight:       135.6 lb Date of Birth:  12-16-1965      BSA:          1.695 m Patient Age:    54 years       BP:           141/90 mmHg Patient Gender: F              HR:           79 bpm. Exam Location:  Inpatient Procedure: 2D Echo, Cardiac Doppler  and Color Doppler Indications:    Stroke  History:        Patient has prior history of Echocardiogram examinations, most                 recent 09/04/2015. Risk Factors:Dyslipidemia.  Sonographer:    Roosvelt Maser RDCS Referring Phys: [id number] RONDELL A SMITH IMPRESSIONS  1. Left ventricular ejection fraction, by estimation,  is 70 to 75%. The left ventricle has hyperdynamic function. The left ventricle has no regional wall motion abnormalities. Indeterminate diastolic filling due to E-A fusion.  2. Intracavitary gradient with max instantaneous gradient of 19 mmHg.  3. Right ventricular systolic function is normal. The right ventricular size is normal. Tricuspid regurgitation signal is inadequate for assessing PA pressure.  4. The mitral valve is normal in structure. Trivial mitral valve regurgitation. No evidence of mitral stenosis.  5. The aortic valve is grossly normal. Aortic valve regurgitation is not visualized. No aortic stenosis is present.  6. The inferior vena cava is normal in size with greater than 50% respiratory variability, suggesting right atrial pressure of 3 mmHg. Conclusion(s)/Recommendation(s): No intracardiac source of embolism detected on this transthoracic study. A transesophageal echocardiogram is recommended to exclude cardiac source of embolism if clinically indicated. FINDINGS  Left Ventricle: Left ventricular ejection fraction, by estimation, is 70 to 75%. The left ventricle has hyperdynamic function. The left ventricle has no regional wall motion abnormalities. The left ventricular internal cavity size was normal in size. There is no left ventricular hypertrophy. Indeterminate diastolic filling due to E-A fusion. Right Ventricle: The right ventricular size is normal. No increase in right ventricular wall thickness. Right ventricular systolic function is normal. Tricuspid regurgitation signal is inadequate for assessing PA pressure. Left Atrium: Left atrial size was normal in size. Right  Atrium: Right atrial size was normal in size. Pericardium: There is no evidence of pericardial effusion. Mitral Valve: The mitral valve is normal in structure. Trivial mitral valve regurgitation. No evidence of mitral valve stenosis. Tricuspid Valve: The tricuspid valve is normal in structure. Tricuspid valve regurgitation is mild . No evidence of tricuspid stenosis. Aortic Valve: The aortic valve is grossly normal. Aortic valve regurgitation is not visualized. No aortic stenosis is present. Pulmonic Valve: The pulmonic valve was normal in structure. Pulmonic valve regurgitation is not visualized. No evidence of pulmonic stenosis. Aorta: The aortic root is normal in size and structure. Venous: The inferior vena cava is normal in size with greater than 50% respiratory variability, suggesting right atrial pressure of 3 mmHg. IAS/Shunts: No atrial level shunt detected by color flow Doppler.  LEFT VENTRICLE PLAX 2D LVIDd:         3.90 cm LVIDs:         2.60 cm LV PW:         1.00 cm LV IVS:        0.80 cm LVOT diam:     1.80 cm LV SV:         46 LV SV Index:   27 LVOT Area:     2.54 cm  RIGHT VENTRICLE             IVC RV S prime:     15.90 cm/s  IVC diam: 1.40 cm TAPSE (M-mode): 1.7 cm LEFT ATRIUM             Index       RIGHT ATRIUM           Index LA diam:        2.70 cm 1.59 cm/m  RA Area:     13.60 cm LA Vol (A2C):   41.2 ml 24.30 ml/m RA Volume:   34.50 ml  20.35 ml/m LA Vol (A4C):   29.8 ml 17.58 ml/m LA Biplane Vol: 35.1 ml 20.70 ml/m  AORTIC VALVE LVOT Vmax:   110.00 cm/s LVOT Vmean:  70.000 cm/s LVOT VTI:  0.182 m  AORTA Ao Root diam: 2.60 cm Ao Asc diam:  2.70 cm MITRAL VALVE MV Area (PHT): 3.91 cm    SHUNTS MV Decel Time: 194 msec    Systemic VTI:  0.18 m MV E velocity: 66.20 cm/s  Systemic Diam: 1.80 cm MV A velocity: 99.50 cm/s MV E/A ratio:  0.67 Cherlynn Kaiser MD Electronically signed by Cherlynn Kaiser MD Signature Date/Time: 07/28/2020/10:06:33 AM    Final    VAS US CAROTID (at [facility] and [facility]  only)  Result Date: 07/28/2020 Carotid Arterial Duplex Study Indications:       CVA. Risk Factors:      Hypertension, hyperlipidemia, no history of smoking. Comparison Study:  No previous exams Performing Technologist: Vonzell Schlatter RVT  Examination Guidelines: A complete evaluation includes B-mode imaging, spectral Doppler, color Doppler, and power Doppler as needed of all accessible portions of each vessel. Bilateral testing is considered an integral part of a complete examination. Limited examinations for reoccurring indications may be performed as noted.  Right Carotid Findings: +----------+--------+--------+--------+------------------+--------+           PSV cm/sEDV cm/sStenosisPlaque DescriptionComments +----------+--------+--------+--------+------------------+--------+ CCA Prox  110     18                                         +----------+--------+--------+--------+------------------+--------+ CCA Distal100     24                                         +----------+--------+--------+--------+------------------+--------+ ICA Prox  82      18      1-39%   heterogenous               +----------+--------+--------+--------+------------------+--------+ ICA Distal111     33                                         +----------+--------+--------+--------+------------------+--------+ ECA       111     15                                         +----------+--------+--------+--------+------------------+--------+ +----------+--------+-------+--------+-------------------+           PSV cm/sEDV cmsDescribeArm Pressure (mmHG) +----------+--------+-------+--------+-------------------+ Subclavian230                                        +----------+--------+-------+--------+-------------------+ +---------+--------+--+--------+--+ VertebralPSV cm/s97EDV cm/s17 +---------+--------+--+--------+--+  Left Carotid Findings:  +----------+--------+--------+--------+------------------+--------+           PSV cm/sEDV cm/sStenosisPlaque DescriptionComments +----------+--------+--------+--------+------------------+--------+ CCA Prox  182     26                                         +----------+--------+--------+--------+------------------+--------+ CCA Distal121     24                                         +----------+--------+--------+--------+------------------+--------+  ICA Prox  101     25      1-39%   heterogenous               +----------+--------+--------+--------+------------------+--------+ ICA Distal75      24                                         +----------+--------+--------+--------+------------------+--------+ ECA       107     17                                         +----------+--------+--------+--------+------------------+--------+ +----------+--------+--------+--------+-------------------+           PSV cm/sEDV cm/sDescribeArm Pressure (mmHG) +----------+--------+--------+--------+-------------------+ [id number]                                         +----------+--------+--------+--------+-------------------+ +---------+--------+--+--------+--+ VertebralPSV cm/s88EDV cm/s17 +---------+--------+--+--------+--+   Summary: Right Carotid: Velocities in the right ICA are consistent with a 1-39% stenosis. Left Carotid: Velocities in the left ICA are consistent with a 1-39% stenosis. Vertebrals:  Bilateral vertebral arteries demonstrate antegrade flow. Subclavians: Normal flow hemodynamics were seen in bilateral subclavian              arteries. *See table(s) above for measurements and observations.     Preliminary      TODAY-DAY OF DISCHARGE:  Subjective:   Dakisha Schoof today has no headache,no chest abdominal pain,no new weakness tingling or numbness, feels much better wants to go home today.   Objective:   Blood pressure 132/84, pulse 87,  temperature 98.1 F (36.7 C), temperature source Oral, resp. rate 18, last menstrual period 09/13/2015, SpO2 100 %. No intake or output data in the 24 hours ending 07/28/20 1440 There were no vitals filed for this visit.  Exam: Awake Alert, Oriented *3, No new F.N deficits, Normal affect [place].AT,PERRAL Supple Neck,No JVD, No cervical lymphadenopathy appriciated.  Symmetrical Chest wall movement, Good air movement bilaterally, CTAB RRR,No Gallops,Rubs or new Murmurs, No Parasternal Heave +ve B.Sounds, Abd Soft, Non tender, No organomegaly appriciated, No rebound -guarding or rigidity. No Cyanosis, Clubbing or edema, No new Rash or bruise   PERTINENT RADIOLOGIC STUDIES: CT HEAD WO CONTRAST  Result Date: 07/27/2020 CLINICAL DATA:  Neuro deficit, acute stroke suspected. EXAM: CT HEAD WITHOUT CONTRAST TECHNIQUE: Contiguous axial images were obtained from the base of the skull through the vertex without intravenous contrast. COMPARISON:  None. FINDINGS: Brain: Abnormal hypodensity in the right basal ganglia extending into the overlying corona radiata, compatible with acute infarct which is better characterized on same day MRI (which is begun/in progress at the time of dictation). Mild edema without mass effect. No midline shift. Basal cisterns are patent. No acute hemorrhage. No hydrocephalus. No extra-axial fluid collection. No mass lesion. Vascular: Calcific atherosclerosis. No evidence of a hyperdense vessel. Skull: No acute fracture. Sinuses/Orbits: Visualized sinuses are clear. Other: No mastoid effusions. IMPRESSION: Acute infarct in the right basal ganglia, extending into overlying corona radiata. Mild edema without mass effect. Electronically Signed   By: Margaretha Sheffield MD   On: 07/27/2020 09:38   MR ANGIO HEAD WO CONTRAST  Result Date: 07/27/2020 CLINICAL DATA:  Follow-up acute infarction right deep brain. EXAM:  MRA HEAD WITHOUT CONTRAST TECHNIQUE: Angiographic images of the Circle of  [place] were obtained using MRA technique without intravenous contrast. COMPARISON:  MRI same day FINDINGS: Both internal carotid arteries widely patent through the skull base and siphon regions. The anterior and middle cerebral vessels are patent without proximal stenosis, aneurysm or vascular malformation. Both vertebral arteries widely patent to the basilar. No basilar stenosis. Posterior circulation branch vessels are normal. IMPRESSION: Normal intracranial MR angiography. Electronically Signed   By: Nelson Chimes M.D.   On: 07/27/2020 19:00   MR BRAIN WO CONTRAST  Result Date: 07/27/2020 CLINICAL DATA:  Neuro deficit, acute stroke suspected. EXAM: MRI HEAD WITHOUT CONTRAST TECHNIQUE: Multiplanar, multiecho pulse sequences of the brain and surrounding structures were obtained without intravenous contrast. COMPARISON:  Same day head CT. FINDINGS: Brain: Acute infarct involving the right corona radiata and right basal ganglia. Associated edema without mass effect. No acute hemorrhage. No hydrocephalus. No mass lesion. No midline shift. Basal cisterns are patent. No extra-axial fluid collection. Mild additional small T2/FLAIR hyperintensities, nonspecific but most likely related to chronic microvascular ischemic disease. Vascular: Major arterial flow voids are maintained at the skull base. Skull and upper cervical spine: Normal marrow signal. Sinuses/Orbits: Clear sinuses.  Bilateral posterior staphylomas. Other: No mastoid effusions. IMPRESSION: 1. 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 Associated edema without mass effect. 2. Bilateral posterior staphylomas. Electronically Signed   By: Margaretha Sheffield MD   On: 07/27/2020 09:57   ECHOCARDIOGRAM COMPLETE  Result Date: 07/28/2020    ECHOCARDIOGRAM REPORT   Patient Name:   Tina Horton Date of Exam: 07/28/2020 Medical Rec #:  [id number]      Height:       66.0 in Accession #:    [id number]     Weight:       135.6 lb Date of  Birth:  10/09/1965      BSA:          1.695 m Patient Age:    32 years       BP:           141/90 mmHg Patient Gender: F              HR:           79 bpm. Exam Location:  Inpatient Procedure: 2D Echo, Cardiac Doppler and Color Doppler Indications:    Stroke  History:        Patient has prior history of Echocardiogram examinations, most                 recent 09/04/2015. Risk Factors:Dyslipidemia.  Sonographer:    Merrie Roof RDCS Referring Phys: [id number] [place]  1. Left ventricular ejection fraction, by estimation, is 70 to 75%. The left ventricle has hyperdynamic function. The left ventricle has no regional wall motion abnormalities. Indeterminate diastolic filling due to E-A fusion.  2. Intracavitary gradient with max instantaneous gradient of 19 mmHg.  3. Right ventricular systolic function is normal. The right ventricular size is normal. Tricuspid regurgitation signal is inadequate for assessing PA pressure.  4. The mitral valve is normal in structure. Trivial mitral valve regurgitation. No evidence of mitral stenosis.  5. The aortic valve is grossly normal. Aortic valve regurgitation is not visualized. No aortic stenosis is present.  6. The inferior vena cava is normal in size with greater than 50% respiratory variability, suggesting right atrial pressure of 3 mmHg. Conclusion(s)/Recommendation(s): No intracardiac source of embolism detected  on this transthoracic study. A transesophageal echocardiogram is recommended to exclude cardiac source of embolism if clinically indicated. FINDINGS  Left Ventricle: Left ventricular ejection fraction, by estimation, is 70 to 75%. The left ventricle has hyperdynamic function. The left ventricle has no regional wall motion abnormalities. The left ventricular internal cavity size was normal in size. There is no left ventricular hypertrophy. Indeterminate diastolic filling due to E-A fusion. Right Ventricle: The right ventricular size is normal. No increase  in right ventricular wall thickness. Right ventricular systolic function is normal. Tricuspid regurgitation signal is inadequate for assessing PA pressure. Left Atrium: Left atrial size was normal in size. Right Atrium: Right atrial size was normal in size. Pericardium: There is no evidence of pericardial effusion. Mitral Valve: The mitral valve is normal in structure. Trivial mitral valve regurgitation. No evidence of mitral valve stenosis. Tricuspid Valve: The tricuspid valve is normal in structure. Tricuspid valve regurgitation is mild . No evidence of tricuspid stenosis. Aortic Valve: The aortic valve is grossly normal. Aortic valve regurgitation is not visualized. No aortic stenosis is present. Pulmonic Valve: The pulmonic valve was normal in structure. Pulmonic valve regurgitation is not visualized. No evidence of pulmonic stenosis. Aorta: The aortic root is normal in size and structure. Venous: The inferior vena cava is normal in size with greater than 50% respiratory variability, suggesting right atrial pressure of 3 mmHg. IAS/Shunts: No atrial level shunt detected by color flow Doppler.  LEFT VENTRICLE PLAX 2D LVIDd:         3.90 cm LVIDs:         2.60 cm LV PW:         1.00 cm LV IVS:        0.80 cm LVOT diam:     1.80 cm LV SV:         46 LV SV Index:   27 LVOT Area:     2.54 cm  RIGHT VENTRICLE             IVC RV S prime:     15.90 cm/s  IVC diam: 1.40 cm TAPSE (M-mode): 1.7 cm LEFT ATRIUM             Index       RIGHT ATRIUM           Index LA diam:        2.70 cm 1.59 cm/m  RA Area:     13.60 cm LA Vol (A2C):   41.2 ml 24.30 ml/m RA Volume:   34.50 ml  20.35 ml/m LA Vol (A4C):   29.8 ml 17.58 ml/m LA Biplane Vol: 35.1 ml 20.70 ml/m  AORTIC VALVE LVOT Vmax:   110.00 cm/s LVOT Vmean:  70.000 cm/s LVOT VTI:    0.182 m  AORTA Ao Root diam: 2.60 cm Ao Asc diam:  2.70 cm MITRAL VALVE MV Area (PHT): 3.91 cm    SHUNTS MV Decel Time: 194 msec    Systemic VTI:  0.18 m MV E velocity: 66.20 cm/s  Systemic  Diam: 1.80 cm MV A velocity: 99.50 cm/s MV E/A ratio:  0.67 Weston Brass MD Electronically signed by Weston Brass MD Signature Date/Time: 07/28/2020/10:06:33 AM    Final    VAS US CAROTID (at [facility] and [facility] only)  Result Date: 07/28/2020 Carotid Arterial Duplex Study Indications:       CVA. Risk Factors:      Hypertension, hyperlipidemia, no history of smoking. Comparison Study:  No previous exams Performing Technologist: Clint Guy  RVT  Examination Guidelines: A complete evaluation includes B-mode imaging, spectral Doppler, color Doppler, and power Doppler as needed of all accessible portions of each vessel. Bilateral testing is considered an integral part of a complete examination. Limited examinations for reoccurring indications may be performed as noted.  Right Carotid Findings: +----------+--------+--------+--------+------------------+--------+           PSV cm/sEDV cm/sStenosisPlaque DescriptionComments +----------+--------+--------+--------+------------------+--------+ CCA Prox  110     18                                         +----------+--------+--------+--------+------------------+--------+ CCA Distal100     24                                         +----------+--------+--------+--------+------------------+--------+ ICA Prox  82      18      1-39%   heterogenous               +----------+--------+--------+--------+------------------+--------+ ICA Distal111     33                                         +----------+--------+--------+--------+------------------+--------+ ECA       111     15                                         +----------+--------+--------+--------+------------------+--------+ +----------+--------+-------+--------+-------------------+           PSV cm/sEDV cmsDescribeArm Pressure (mmHG) +----------+--------+-------+--------+-------------------+ Subclavian230                                         +----------+--------+-------+--------+-------------------+ +---------+--------+--+--------+--+ VertebralPSV cm/s97EDV cm/s17 +---------+--------+--+--------+--+  Left Carotid Findings: +----------+--------+--------+--------+------------------+--------+           PSV cm/sEDV cm/sStenosisPlaque DescriptionComments +----------+--------+--------+--------+------------------+--------+ CCA Prox  182     26                                         +----------+--------+--------+--------+------------------+--------+ CCA Distal121     24                                         +----------+--------+--------+--------+------------------+--------+ ICA Prox  101     25      1-39%   heterogenous               +----------+--------+--------+--------+------------------+--------+ ICA Distal75      24                                         +----------+--------+--------+--------+------------------+--------+ ECA       107     17                                         +----------+--------+--------+--------+------------------+--------+ +----------+--------+--------+--------+-------------------+  PSV cm/sEDV cm/sDescribeArm Pressure (mmHG) +----------+--------+--------+--------+-------------------+ Subclavian192                                         +----------+--------+--------+--------+-------------------+ +---------+--------+--+--------+--+ VertebralPSV cm/s88EDV cm/s17 +---------+--------+--+--------+--+   Summary: Right Carotid: Velocities in the right ICA are consistent with a 1-39% stenosis. Left Carotid: Velocities in the left ICA are consistent with a 1-39% stenosis. Vertebrals:  Bilateral vertebral arteries demonstrate antegrade flow. Subclavians: Normal flow hemodynamics were seen in bilateral subclavian              arteries. *See table(s) above for measurements and observations.     Preliminary      PERTINENT LAB RESULTS: CBC: Recent Labs     07/25/20 2304 07/27/20 0837 07/27/20 1007  WBC 6.1 6.0  --   HGB 13.1 13.0 12.6  HCT 38.3 38.0 37.0  PLT 272 255  --    CMET CMP     Component Value Date/Time   NA 139 07/27/2020 1007   NA 141 07/24/2020 1116   K 3.5 07/27/2020 1007   CL 99 07/27/2020 1007   CO2 25 07/27/2020 0837   GLUCOSE 192 (H) 07/27/2020 1007   BUN 19 07/27/2020 1007   BUN 14 07/24/2020 1116   CREATININE 1.00 07/27/2020 1007   CREATININE 0.97 12/04/2015 0940   CALCIUM 9.9 07/27/2020 0837   PROT 7.3 07/27/2020 0837   PROT 7.6 07/24/2020 1116   ALBUMIN 4.2 07/27/2020 0837   ALBUMIN 4.9 07/24/2020 1116   AST 19 07/27/2020 0837   ALT 16 07/27/2020 0837   ALKPHOS 71 07/27/2020 0837   BILITOT 1.5 (H) 07/27/2020 0837   BILITOT 1.0 07/24/2020 1116   GFRNONAA 53 (L) 07/27/2020 0837   GFRAA 90 11/29/2019 1011    GFR Estimated Creatinine Clearance: 59.5 mL/min (by C-G formula based on SCr of 1 mg/dL). No results for input(s): LIPASE, AMYLASE in the last 72 hours. No results for input(s): CKTOTAL, CKMB, CKMBINDEX, TROPONINI in the last 72 hours. Invalid input(s): POCBNP No results for input(s): DDIMER in the last 72 hours. No results for input(s): HGBA1C in the last 72 hours. No results for input(s): CHOL, HDL, LDLCALC, TRIG, CHOLHDL, LDLDIRECT in the last 72 hours. Recent Labs    07/26/20 1108  TSH 0.861   No results for input(s): VITAMINB12, FOLATE, FERRITIN, TIBC, IRON, RETICCTPCT in the last 72 hours. Coags: Recent Labs    07/27/20 0837  INR 1.1   Microbiology: Recent Results (from the past 240 hour(s))  Resp Panel by RT-PCR (Flu A&B, Covid) Nasopharyngeal Swab     Status: None   Collection Time: 07/27/20  8:46 AM   Specimen: Nasopharyngeal Swab; Nasopharyngeal(NP) swabs in vial transport medium  Result Value Ref Range Status   SARS Coronavirus 2 by RT PCR NEGATIVE NEGATIVE Final    Comment: (NOTE) SARS-CoV-2 target nucleic acids are NOT DETECTED.  The SARS-CoV-2 RNA is generally  detectable in upper respiratory specimens during the acute phase of infection. The lowest concentration of SARS-CoV-2 viral copies this assay can detect is 138 copies/mL. A negative result does not preclude SARS-Cov-2 infection and should not be used as the sole basis for treatment or other patient management decisions. A negative result may occur with  improper specimen collection/handling, submission of specimen other than nasopharyngeal swab, presence of viral mutation(s) within the areas targeted by this assay, and inadequate number of viral copies(<138 copies/mL). A  negative result must be combined with clinical observations, patient history, and epidemiological information. The expected result is Negative.  Fact Sheet for Patients:  EntrepreneurPulse.com.au  Fact Sheet for Healthcare Providers:  IncredibleEmployment.be  This test is no t yet approved or cleared by the [place] FDA and  has been authorized for detection and/or diagnosis of SARS-CoV-2 by FDA under an Emergency Use Authorization (EUA). This EUA will remain  in effect (meaning this test can be used) for the duration of the COVID-19 declaration under Section 564(b)(1) of the Act, 21 U.S.C.section 360bbb-3(b)(1), unless the authorization is terminated  or revoked sooner.       Influenza A by PCR NEGATIVE NEGATIVE Final   Influenza B by PCR NEGATIVE NEGATIVE Final    Comment: (NOTE) The Xpert Xpress SARS-CoV-2/FLU/RSV plus assay is intended as an aid in the diagnosis of influenza from Nasopharyngeal swab specimens and should not be used as a sole basis for treatment. Nasal washings and aspirates are unacceptable for Xpert Xpress SARS-CoV-2/FLU/RSV testing.  Fact Sheet for Patients: EntrepreneurPulse.com.au  Fact Sheet for Healthcare Providers: IncredibleEmployment.be  This test is not yet approved or cleared by the [place] FDA  and has been authorized for detection and/or diagnosis of SARS-CoV-2 by FDA under an Emergency Use Authorization (EUA). This EUA will remain in effect (meaning this test can be used) for the duration of the COVID-19 declaration under Section 564(b)(1) of the Act, 21 U.S.C. section 360bbb-3(b)(1), unless the authorization is terminated or revoked.  Performed at [facility], [place] [address], [place], [place] [address]     FURTHER DISCHARGE INSTRUCTIONS:  Get Medicines reviewed and adjusted: Please take all your medications with you for your next visit with your Primary MD  Laboratory/radiological data: Please request your Primary MD to go over all hospital tests and procedure/radiological results at the follow up, please ask your Primary MD to get all Hospital records sent to his/her office.  In some cases, they will be blood work, cultures and biopsy results pending at the time of your discharge. Please request that your primary care M.D. goes through all the records of your hospital data and follows up on these results.  Also Note the following: If you experience worsening of your admission symptoms, develop shortness of breath, life threatening emergency, suicidal or homicidal thoughts you must seek medical attention immediately by calling 911 or calling your MD immediately  if symptoms less severe.  You must read complete instructions/literature along with all the possible adverse reactions/side effects for all the Medicines you take and that have been prescribed to you. Take any new Medicines after you have completely understood and accpet all the possible adverse reactions/side effects.   Do not drive when taking Pain medications or sleeping medications (Benzodaizepines)  Do not take more than prescribed Pain, Sleep and Anxiety Medications. It is not advisable to combine anxiety,sleep and pain medications without talking with your primary care practitioner  Special  Instructions: If you have smoked or chewed Tobacco  in the last 2 yrs please stop smoking, stop any regular Alcohol  and or any Recreational drug use.  Wear Seat belts while driving.  Please note: You were cared for by a hospitalist during your hospital stay. Once you are discharged, your primary care physician will handle any further medical issues. Please note that NO REFILLS for any discharge medications will be authorized once you are discharged, as it is imperative that you return to your primary care physician (or establish a  relationship with a primary care physician if you do not have one) for your post hospital discharge needs so that they can reassess your need for medications and monitor your lab values.  Total Time spent coordinating discharge including counseling, education and face to face time equals 35 minutes.  SignedOren Binet 07/28/2020 2:40 PM

## 2020-07-29 DIAGNOSIS — R002 Palpitations: Secondary | ICD-10-CM

## 2020-07-29 LAB — T3, FREE: T3, Free: 2.4 pg/mL (ref 2.0–4.4)

## 2020-07-31 ENCOUNTER — Telehealth: Payer: Self-pay | Admitting: *Deleted

## 2020-07-31 ENCOUNTER — Telehealth: Payer: Self-pay

## 2020-07-31 NOTE — Telephone Encounter (Signed)
On 07/26/20 patient was enrolled for [employer] to ship a 14 day ZIO XT long term holter monitor to her home for dx. of palpitations.  ZIO XT monitor was delivered to the patients home on 07/28/2020. Patient was hospitalized on  07/27/2020 with CVA.  Patient was discharged on 07/28/2020.  An order for a 30 Day Cardiac Event Monitor was placed at discharge. Patient applied 14 day ZIO XT monitor after discharge.  Patient informed a second monitor, (30 day cardiac event monitor), was ordered at discharge. Should patient continue wearing 14 day ZIO XT long term holter.monitor and subsequently wear 30 day event monitor?  If not, which monitor order should be cancelled?

## 2020-07-31 NOTE — Telephone Encounter (Signed)
I called Pt. Per my TOC report she was recently in the hospital for hypertension and CVA She is scheduled here for a hospital f/u on 08/07/20. Her medications were gone over and reconciled. She is also to f/u with cardiology and neurology.

## 2020-07-31 NOTE — Telephone Encounter (Signed)
I will defer to Dr.Berry. 30 days monitor was standard workup order requested by discharging physician which is advised by card master to be placed.

## 2020-08-01 ENCOUNTER — Other Ambulatory Visit: Payer: Self-pay

## 2020-08-01 ENCOUNTER — Ambulatory Visit (INDEPENDENT_AMBULATORY_CARE_PROVIDER_SITE_OTHER): Payer: Medicare HMO | Admitting: Medical

## 2020-08-01 ENCOUNTER — Telehealth: Payer: Self-pay | Admitting: *Deleted

## 2020-08-01 ENCOUNTER — Ambulatory Visit: Payer: Medicare HMO | Attending: Internal Medicine | Admitting: Physical Therapy

## 2020-08-01 ENCOUNTER — Encounter: Payer: Self-pay | Admitting: Medical

## 2020-08-01 VITALS — BP 136/80 | HR 88 | Temp 98.6°F | Ht 66.0 in | Wt 134.4 lb

## 2020-08-01 DIAGNOSIS — R278 Other lack of coordination: Secondary | ICD-10-CM | POA: Insufficient documentation

## 2020-08-01 DIAGNOSIS — M6281 Muscle weakness (generalized): Secondary | ICD-10-CM | POA: Diagnosis not present

## 2020-08-01 DIAGNOSIS — E1165 Type 2 diabetes mellitus with hyperglycemia: Secondary | ICD-10-CM | POA: Diagnosis not present

## 2020-08-01 DIAGNOSIS — E041 Nontoxic single thyroid nodule: Secondary | ICD-10-CM

## 2020-08-01 DIAGNOSIS — E782 Mixed hyperlipidemia: Secondary | ICD-10-CM | POA: Diagnosis not present

## 2020-08-01 DIAGNOSIS — N182 Chronic kidney disease, stage 2 (mild): Secondary | ICD-10-CM

## 2020-08-01 DIAGNOSIS — I10 Essential (primary) hypertension: Secondary | ICD-10-CM

## 2020-08-01 DIAGNOSIS — IMO0002 Reserved for concepts with insufficient information to code with codable children: Secondary | ICD-10-CM

## 2020-08-01 DIAGNOSIS — E118 Type 2 diabetes mellitus with unspecified complications: Secondary | ICD-10-CM | POA: Diagnosis not present

## 2020-08-01 DIAGNOSIS — Z794 Long term (current) use of insulin: Secondary | ICD-10-CM

## 2020-08-01 DIAGNOSIS — R2681 Unsteadiness on feet: Secondary | ICD-10-CM | POA: Insufficient documentation

## 2020-08-01 DIAGNOSIS — I69354 Hemiplegia and hemiparesis following cerebral infarction affecting left non-dominant side: Secondary | ICD-10-CM | POA: Diagnosis not present

## 2020-08-01 DIAGNOSIS — R0602 Shortness of breath: Secondary | ICD-10-CM

## 2020-08-01 DIAGNOSIS — R2689 Other abnormalities of gait and mobility: Secondary | ICD-10-CM | POA: Diagnosis not present

## 2020-08-01 DIAGNOSIS — I639 Cerebral infarction, unspecified: Secondary | ICD-10-CM | POA: Diagnosis not present

## 2020-08-01 DIAGNOSIS — H5410 Blindness, one eye, low vision other eye, unspecified eyes: Secondary | ICD-10-CM | POA: Diagnosis not present

## 2020-08-01 NOTE — Patient Instructions (Signed)
Hello Dr. Glade Lloyd, Jericha was evaluated by physical therapy today and we feel she would also benefit from occupational therapy to address her LUE weakness and impaired coordination and to assist with full return to ADL's.  If you agree please enter an order into Epic for OT eval and treat or fax referral to [facility] at [phone].  Thank you for your assistance, Tilda Burrow. Posey Rea, DPT  [facility] Team Lead   Phone: [phone]  Fax: [phone] Website: [email or web address]

## 2020-08-01 NOTE — Patient Instructions (Addendum)
Your exam and symptoms today suggest possibly allergy related symptoms  There is no findings today suggestive of pneumonia or other illness   Begin either Flonase allergy nasal spray over-the-counter, or nasal saline salt water spray, or Claritin or Zyrtec allergy pill over-the-counter.  Try 1 of these measures but not all of them.  This will probably help your symptoms.  Often people need to use an allergy treatment for 1 to 2 months during spring allergy season   Drink plenty of water throughout the day, consistent water intake   We will set up the referral to Occupational Therapy to go along with your physical therapy  Continue plan to see Dr. Patel/neurology and cardiology in follow-up from the recent hospitalization  Continue your current medications   Diabetes, thyroid nodule Unfortunately several endocrinology offices will not see you.  Due to the number of cancellations with [facility] endocrinology, they will not take you as a patient  Dr. Cindra Eves office also declined to take you on as a patient  We are working to get you in with a different endocrinology office.  You may need to call and get your own appointment if we cannot make this happen or if you cancel that appointment as well   This was an acute visit due to your acute symptoms.  I would still come back as planned for the hospital follow-up visit as you are due for labs in another week as a follow-up from the hospital

## 2020-08-01 NOTE — Progress Notes (Signed)
Subjective:  Tina Horton is a 55 y.o. female who presents for Chief Complaint  Patient presents with  . Nasal Congestion    With shortness of breath      Here today accompanied by her husband.  Medical team: Lonie Peak, ophthalmology Dr. Quay Burow, cardiology Dr. Basil Dess, orthopedics Dr. Noel Journey, nephrology Dr. Bo Merino, rheumatology Dr. Narda Amber, neurology Dr. Raynelle Bring, urology   Of note I reviewed her hospital record: She was just discharged from the hospital March 11 here locally.  She was admitted July 27, 2020 through July 28, 2020 for acute stroke.  She was advised to follow-up within 1 to 2 weeks including repeat competency metabolic and CBC, and is supposed to have follow-up with cardiology and neurology as well as 30-day cardiac monitoring.   Was advised aspirin plus Plavix for 3 weeks then aspirin only  Thyroid nodule to be further evaluated by endocrinology  Here today for shortness of breath.  Nose very stopped up, and couldn't get deep breath due to nasal congestion.  No fever, no body aches or chills, no swelling in legs.   No thick mucous or production.  No sick contacts, but was just in hospital.   Nose is itchy.  No itchy or watery eyes.    Using nothing for current symptoms.    Has left sided weakness from recent stroke  Still has another week on the cardiac monitor she is using from Dr. Gwenlyn Found   No other aggravating or relieving factors.    No other c/o.  Past Medical History:  Diagnosis Date  . Anemia   . Arthritis    knees  . Bronchitis   . Diabetes mellitus without complication [facility])    age 10yo  . GERD (gastroesophageal reflux disease)   . Glaucoma    Dr. Einar Gip  . H/O mammogram 2005  . Headache   . Heart murmur   . Hyperlipidemia   . Hypertension   . Intermittent palpitations   . Legally blind   . Myopia   . Pneumonia   . Routine gynecological examination    last pap 2005  . Seasonal  allergies   . Shortness of breath dyspnea   . Sinusitis   . Thyroid nodule    Current Outpatient Medications on File Prior to Visit  Medication Sig Dispense Refill  . Alcohol Swabs (B-D SINGLE USE SWABS REGULAR) PADS USE  WHEN  TESTING BLOOD SUGAR 300 each 0  . aspirin EC 81 MG tablet Take 1 tablet (81 mg total) by mouth daily. 90 tablet 3  . atorvastatin (LIPITOR) 80 MG tablet Take 1 tablet (80 mg total) by mouth daily. 30 tablet 0  . Blood Glucose Monitoring Suppl (PRODIGY AUTOCODE BLOOD GLUCOSE) w/Device KIT     . carvedilol (COREG) 25 MG tablet Take 1 tablet (25 mg total) by mouth 2 (two) times daily. 180 tablet 3  . cholecalciferol (VITAMIN D3) 25 MCG (1000 UNIT) tablet Take 1,000 Units by mouth See admin instructions. Take 1 tablet (1000 units totally) by mouth every day; except take 5000 units totally by mouth on Tuesday and Friday    . clopidogrel (PLAVIX) 75 MG tablet Take 1 tablet (75 mg total) by mouth daily. 30 tablet 0  . dorzolamide-timolol (COSOPT) 22.3-6.8 MG/ML ophthalmic solution Place 1 drop into both eyes 2 (two) times daily.    [name] glucose blood (PRODIGY NO CODING BLOOD GLUC) test strip Use as instructed 100 each 12  . Insulin Pen  Needle (NOVOFINE PLUS) 32G X 4 MM MISC Use 4 times a day with Lantus Insulin 1000 each 0  . latanoprost (XALATAN) 0.005 % ophthalmic solution Place 1 drop into both eyes at bedtime.    [name] LEVEMIR FLEXTOUCH 100 UNIT/ML FlexPen INJECT 40 UNITS SUBCUTANEOUSLY AT BEDTIME (Patient taking differently: Inject 38 Units into the skin at bedtime.) 15 mL 0  . losartan (COZAAR) 100 MG tablet Take 1 tablet (100 mg total) by mouth daily. 90 tablet 3  . NOVOLOG FLEXPEN 100 UNIT/ML FlexPen INJECT 20 UNITS SUBCUTANEOUSLY THREE TIMES DAILY WITH MEALS (USE SLIDING SCALE PROVIDED BY MD)(PEN EXPIRES AT 28 DAYS) (Patient taking differently: Inject 16 Units into the skin 3 (three) times daily with meals.) 60 mL 5  . PRODIGY NO CODING BLOOD GLUC test strip TEST BLOOD SUGAR  THREE TIMES DAILY 300 strip 0  . Prodigy Twist Top Lancets 28G MISC USE TO TEST BLOOD SUGAR THREE TIMES DAILY 300 each 0   No current facility-administered medications on file prior to visit.    The following portions of the patient's history were reviewed and updated as appropriate: allergies, current medications, past family history, past medical history, past social history, past surgical history and problem list.  ROS Otherwise as in subjective above    Objective: BP 136/80   Pulse 88   Temp 98.6 F (37 C)   Ht $R'5\' 6"'BR$  (1.676 m)   Wt 134 lb 6.4 oz (61 kg)   LMP 09/13/2015   SpO2 98%   BMI 21.69 kg/m   Wt Readings from Last 3 Encounters:  08/01/20 134 lb 6.4 oz (61 kg)  07/28/20 135 lb (61.2 kg)  07/26/20 135 lb 9.6 oz (61.5 kg)   BP Readings from Last 3 Encounters:  08/01/20 136/80  07/28/20 (!) 162/94  07/26/20 (!) 184/94    General appearance: alert, no distress, well developed, well nourished HEENT: normocephalic, sclerae anicteric, conjunctiva pink and moist, TMs pearly, nares patent, no discharge, but + erythema, pharynx normal Oral cavity: MMM, no lesions Neck: supple, no lymphadenopathy, no thyromegaly, no masses Heart: RRR, normal S1, S2, no murmurs Lungs: CTA bilaterally, no wheezes, rhonchi, or rales Pulses: 2+ radial pulses, 2+ pedal pulses, normal cap refill Ext: no edema 4/5 weakness left upper and lower extremity, somewhat unsteady on feet, cautious with gait,  otherwise nonfocal exam    Assessment: Encounter Diagnoses  Name Primary?  . SOB (shortness of breath) Yes  . Cerebrovascular accident (CVA), unspecified mechanism ([place])   . Essential hypertension   . Mixed hyperlipidemia   . CKD (chronic kidney disease) stage 2, GFR 60-89 ml/min   . Uncontrolled diabetes mellitus with complication, with long-term current use of insulin ([place])   . Thyroid nodule      Plan: Discussed symptoms and concerns.  I suspect current symptoms related to  allergic rhinitis . Begin OCT zyrtec or nasal Flonase, salt water nasal spray.     I reviewed her recent hospital discharge notes, medications reconciled.  I reviewed chest x-ray from July 25, 2020 showing normal, no obvious abnormality I reviewed stat Chem-8 from March 10 showing glucose 192 otherwise normal I reviewed urine drug screen from March 10 that was normal I reviewed CBC from 07/27/2020 that was normal  Referral to occupational therapy.   She has already begun physical therapy for left sided weakness s/p recent CVA  Se has f/u appt scheduled with neurology and cardiology  Shylah was seen today for nasal congestion.  Diagnoses and all orders  for this visit:  SOB (shortness of breath)  Cerebrovascular accident (CVA), unspecified mechanism ([place])  Essential hypertension  Mixed hyperlipidemia  CKD (chronic kidney disease) stage 2, GFR 60-89 ml/min  Uncontrolled diabetes mellitus with complication, with long-term current use of insulin ([facility])  Thyroid nodule    Follow up: 1 week for official hospital f/u and labs

## 2020-08-01 NOTE — Telephone Encounter (Signed)
I would prefer 30 day monitor given CVA

## 2020-08-01 NOTE — Telephone Encounter (Addendum)
Patient enrolled for [employer] to ship a 30 day cardiac event monitor to her home.  She should expect delivery in 3 to 5 days.  Patient is currently wearing the ZIO XT long term holter monitor.  Patient instructed to remove patch prior to applying [employer] cardiac event monitor .  She will return ZIO XT patch to [employer] to process data already recorded on patch recorder. Patient aware she would not be billed for ZIO XT monitor if it is not returned to [facility] for processing.

## 2020-08-01 NOTE — Therapy (Signed)
[facility] [address] [place], [place], [address] Phone: [phone]   Fax:  [phone]  Physical Therapy Evaluation  Patient Details  Name: Tina Horton MRN: [id number] Date of Birth: July 04, 1965 Referring Provider (PT): Referred by hospitalist; will send certification to PCP: Carlena Hurl, PA-C   Encounter Date: 08/01/2020   PT End of Session - 08/01/20 1349    Visit Number 1    Number of Visits 9    Date for PT Re-Evaluation 08/31/20    Authorization Type [employer]    PT Start Time 1239    PT Stop Time 1329    PT Time Calculation (min) 50 min    Activity Tolerance Patient tolerated treatment well    Behavior During Therapy [facility] for tasks assessed/performed           Past Medical History:  Diagnosis Date  . Anemia   . Arthritis    knees  . Bronchitis   . Diabetes mellitus without complication [facility])    age 55yo  . GERD (gastroesophageal reflux disease)   . Glaucoma    Dr. Einar Gip  . H/O mammogram 2005  . Headache   . Heart murmur   . Hyperlipidemia   . Hypertension   . Intermittent palpitations   . Legally blind   . Myopia   . Pneumonia   . Routine gynecological examination    last pap 2005  . Seasonal allergies   . Shortness of breath dyspnea   . Sinusitis   . Thyroid nodule     Past Surgical History:  Procedure Laterality Date  . CYSTECTOMY     umbilicus  . INCISION AND DRAINAGE     abdominal superficial abscess  . LAPAROSCOPIC VAGINAL HYSTERECTOMY WITH SALPINGECTOMY Bilateral 09/13/2015   Procedure: LAPAROSCOPIC ASSISTED VAGINAL HYSTERECTOMY WITH SALPINGECTOMY, [place] Colpoplasty;  Surgeon: Thurnell Lose, MD;  Location: [facility];  Service: Gynecology;  Laterality: Bilateral;  . LASIK     x2    There were no vitals filed for this visit.    Subjective Assessment - 08/01/20 1244    Subjective Pt had COVID in September and that caused spikes in BP; 4th BP spike  caused heart palpitations.  A week later she woke up and felt weakness on L and falling to the L side; went to ED and found CVA on R side.  Has regained some strength in L side but continues to present with impaired balance, falling to L and impaired coordination in LUE.  Having a hard time doing her hair and hasn't returned to household activities yet until she sees the neurologist.    Patient is accompained by: Family member    Pertinent History anemia, OA in knees, DM, glaucoma, heart murmur, HLD, HTN, palpitations, legally blind, PNA, thyroid nodule, sinusitis    Limitations Walking    Currently in Pain? Yes    Pain Score 2     Pain Location Ankle    Pain Orientation Left              [facility] PT Assessment - 08/01/20 1251      Assessment   Medical Diagnosis R Basal Ganglia CVA    Referring Provider (PT) Referred by hospitalist; will send certification to PCP: Carlena Hurl, PA-C    Onset Date/Surgical Date 07/25/20    Hand Dominance Right    Next MD Visit today with PCP      Precautions   Precautions Other (comment)  Precaution Comments pt is legally blind, can see colors; anemia, OA in knees, DM, glaucoma, heart murmur, HLD, HTN, palpitations, legally blind, PNA, thyroid nodule, sinusitis      Balance Screen   Has the patient fallen in the past 6 months No      [place]    Living Arrangements Spouse/significant other;Children    Type of [place] to enter    Entrance Stairs-Number of Steps 2-3    [place] Other (comment);Bedside commode   blind cane     Prior Function   Level of Independence Independent with basic ADLs;Independent with household mobility with device;Independent with community mobility with device;Independent with transfers;Requires assistive device for independence;Needs assistance with homemaking    Leisure walking outside, cooking       Cognition   Overall Cognitive Status Within Functional Limits for tasks assessed      Observation/Other Assessments   Focus on Therapeutic Outcomes (FOTO)  Not performed due to legally blind      Sensation   Light Touch Appears Intact    Additional Comments reports intact to light touch and equal on L and R      Coordination   Gross Motor Movements are Fluid and Coordinated No    Coordination and Movement Description impaired due to weakness      ROM / Strength   AROM / PROM / Strength Strength      Strength   Strength Assessment Site Shoulder;Elbow;Wrist;Hand;Hip;Knee;Ankle    Right/Left Shoulder Left    Left Shoulder Flexion 3+/5    Right/Left Elbow Left    Left Elbow Flexion 4/5    Left Elbow Extension 4/5    Right/Left hand Left    Left Hand Gross Grasp Impaired   compared to R4   Right/Left Hip Left    Left Hip Flexion 4-/5    Right/Left Knee Left    Left Knee Flexion 4-/5    Left Knee Extension 4/5    Right/Left Ankle Left    Left Ankle Dorsiflexion 4-/5      Transfers   Transfers Sit to Stand;Stand to Sit;Stand Pivot Transfers    Sit to Stand 4: Min assist    Sit to Stand Details (indicate cue type and reason) requires UE support and pt tends to push back against mat with back of knees to come to stand; keeps weight shifted posteriorly and to the L    Stand to Sit 4: Min guard    Stand Pivot Transfers 4: Min Doctor, general practice Details (indicate cue type and reason) HHA and verbal cues to guide patient; when performing without therapist's assistance pt requires use of UE to guide pivot chair <> mat      Ambulation/Gait   Ambulation/Gait Yes    Ambulation/Gait Assistance 4: Min assist    Ambulation/Gait Assistance Details Pt typically uses blind cane; today husband or therapist providing HHA on L side with pt presenting with increased lateral lean to L.  Cues for guidance    Ambulation Distance (Feet) 115 Feet    Assistive device 1 person hand held  assist    Gait Pattern Step-through pattern;Decreased step length - right;Decreased step length - left;Decreased stride length;Lateral trunk lean to left;Narrow base of support    Ambulation Surface Level;Indoor      Standardized Balance Assessment   Standardized Balance Assessment Five Times Sit to  Stand;10 meter walk test;Berg Balance Test   Simultaneous filing. User may not have seen previous data.   Five times sit to stand comments  45.44 sec; weight shift posterior and to the left, contact with mat on posterior portion of knee during all reps    10 Meter Walk 27.03 sec; 0.37 m/s      Berg Balance Test   Sit to Stand Able to stand  independently using hands    Standing Unsupported Able to stand 30 seconds unsupported    Sitting with Back Unsupported but Feet Supported on Floor or Stool Able to sit safely and securely 2 minutes    Stand to Sit Controls descent by using hands    Transfers Able to transfer safely, definite need of hands    Standing Unsupported with Eyes Closed Able to stand 10 seconds with supervision    Standing Unsupported with Feet Together Able to place feet together independently but unable to hold for 30 seconds    From Standing, Reach Forward with Outstretched Arm Can reach forward >12 cm safely (5")    From Standing Position, Pick up Object from Floor Able to pick up shoe, needs supervision    From Standing Position, Turn to Look Behind Over each Shoulder Looks behind from both sides and weight shifts well    Turn 360 Degrees Needs close supervision or verbal cueing    Standing Unsupported, Alternately Place Feet on Step/Stool Able to complete >2 steps/needs minimal assist    Standing Unsupported, One Foot in [employer] balance while stepping or standing    Standing on One Leg Tries to lift leg/unable to hold 3 seconds but remains standing independently    Total Score 33    Berg comment: <36 high risk for falls                      Objective  measurements completed on examination: See above findings.               PT Education - 08/01/20 1349    Education Details clinical findings, PT POC and goals, request for OT eval and treat    Person(s) Educated Patient;Spouse    Methods Explanation    Comprehension Verbalized understanding               PT Long Term Goals - 08/01/20 1728      PT LONG TERM GOAL #1   Title Pt will demonstrate ability to safely perform HEP with family's supervision    Time 4    Period Weeks    Status New    Target Date 08/31/20      PT LONG TERM GOAL #2   Title Pt will decrease five time sit to stand to </=20 seconds with supervision without pushing back of knees against mat for stability    Baseline 45.44 sec; weight shift posterior and to the left, contact with mat on posterior portion of knee during all reps    Time 4    Period Weeks    Status New    Target Date 08/31/20      PT LONG TERM GOAL #3   Title Pt will increase gait velocity using blind cane to 0.8 m/sec to indicate decreased falls risk in the community    Baseline .37 m/sec    Time 4    Period Weeks    Status New    Target Date 08/31/20      PT LONG TERM  GOAL #4   Title Pt will increase BERG balance score to >/= 41/56    Baseline 33/56    Time 4    Period Weeks    Status New    Target Date 08/31/20      PT LONG TERM GOAL #5   Title Pt will ambulate x 230' over level, indoor surfaces and around various obstacles with use of blind cane and supervision with no evidence of LOB to the L    Time 4    Period Weeks    Status New    Target Date 08/31/20                  Plan - 08/01/20 1351    Clinical Impression Statement Pt is a 55 year old female referred to Neuro OPPT for evaluation of L 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  Pt's PMH is significant for the following: anemia, OA in knees, DM, glaucoma, heart murmur, HLD, HTN, palpitations, legally blind, PNA, thyroid nodule, sinusitis. The following  deficits were noted during pt's exam: impaired LUE and LLE coordination, impaired strength, impaired static and dynamic standing balance, and gait abnormalities.  Pt's five time sit to stand, gait velocity and BERG scores indicate pt is at high risk for falls. Pt is legally blind and was previously modified independent with a blind cane but now relies on HHA from family to perform safe household mobility and ADLs.  Pt would benefit from skilled PT to address these impairments and functional limitations to maximize functional mobility independence and reduce falls risk.    Personal Factors and Comorbidities Comorbidity 3+;Past/Current Experience    Comorbidities anemia, OA in knees, DM, glaucoma, heart murmur, HLD, HTN, palpitations, legally blind, PNA, thyroid nodule, sinusitis    Examination-Activity Limitations [place];Lift;Locomotion Level;Reach Overhead;Stairs;Stand;Transfers    Examination-Participation Restrictions Cleaning;Community Activity;Meal Prep;Shop    Stability/Clinical Decision Making Evolving/Moderate complexity    Clinical Decision Making Moderate    Rehab Potential Good    PT Frequency 2x / week    PT Duration 4 weeks    PT Treatment/Interventions ADLs/Self Care Home Management;DME Instruction;Gait training;Stair training;Functional mobility training;Therapeutic activities;Therapeutic exercise;Balance training;Neuromuscular re-education;Patient/family education    PT Next Visit Plan Assess Vitals; did OT order come through - if so, please have pt schedule OT eval.  Initiate HEP focusing on LLE strengthening, standing balance, active weight shifting and balance reactions.  Working towards gait with blind cane and supervision.    Consulted and Agree with Plan of Care Patient;Family member/caregiver    Family Member Consulted husband           Patient will benefit from skilled therapeutic intervention in order to improve the following deficits and impairments:  Decreased  balance,Decreased coordination,Abnormal gait,Decreased strength,Difficulty walking,Impaired UE functional use  Visit Diagnosis: Hemiplegia and hemiparesis following cerebral infarction affecting left non-dominant side (HCC)  Muscle weakness (generalized)  Other abnormalities of gait and mobility  Unsteadiness on feet     Problem List Patient Active Problem List   Diagnosis Date Noted  . CVA (cerebral vascular accident) ([place]) 07/27/2020  . Acute kidney injury superimposed on chronic kidney disease ([place]) 07/27/2020  . Palpitation 07/24/2020  . History of COVID-19 05/22/2020  . COVID-19 virus infection 02/25/2020  . Sinus pressure 02/25/2020  . Acute non-recurrent frontal sinusitis 02/25/2020  . Chronic pain of both knees 01/04/2020  . 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 CKD (chronic kidney disease) stage 2, GFR 60-89 ml/min 11/29/2019  . 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  Vitamin D deficiency 03/13/2018  . Primary osteoarthritis of both knees 01/20/2018  . DDD (degenerative disc disease), lumbar 01/20/2018  . Localized swelling, mass or lump of neck 10/30/2017  . Microalbuminuria 08/26/2017  . Mass of neck 08/26/2017  . Screening for cancer 08/20/2017  . Left leg pain 08/20/2017  . Respiratory tract infection 08/20/2017  . Nausea 08/20/2017  . Renal cyst 01/15/2016  . Hydronephrosis of left kidney 01/15/2016  . Spinal stenosis 01/15/2016  . History of burning pain in leg 12/04/2015  . S/P laparoscopic assisted vaginal hysterectomy (LAVH) 09/13/2015  . Encounter for health maintenance examination in adult 08/21/2015  . Hyperlipidemia 08/21/2015  . Glaucoma 08/21/2015  . Heart murmur 08/21/2015  . Thyroid nodule 08/21/2015  . Vaccine refused by patient 08/21/2015  . Noncompliance 03/21/2015  . Essential hypertension 03/21/2015  . Uncontrolled diabetes mellitus with complication, with long-term current use of insulin ([place]) 03/21/2015  . Lymphadenitis 03/21/2015  . SKIN  LESION 10/19/2008  . SYSTOLIC MURMUR 75/91/6384  . GLAUCOMA 07/17/2006  . VISUAL DISTURBANCE NOS 07/17/2006  . REFLUX ESOPHAGITIS 07/17/2006    Rico Junker, PT, DPT 08/01/20    5:41 PM    [facility] [address] [place], [place], [address] Phone: [phone]   Fax:  [phone]  Name: Tina Horton MRN: [id number] Date of Birth: 1965-07-24

## 2020-08-03 ENCOUNTER — Other Ambulatory Visit: Payer: Self-pay

## 2020-08-03 ENCOUNTER — Encounter: Payer: Self-pay | Admitting: Neurology

## 2020-08-03 ENCOUNTER — Ambulatory Visit: Payer: Medicare HMO | Admitting: Neurology

## 2020-08-03 ENCOUNTER — Telehealth: Payer: Self-pay | Admitting: Neurology

## 2020-08-03 VITALS — BP 132/81 | HR 81 | Ht 61.0 in | Wt 135.0 lb

## 2020-08-03 DIAGNOSIS — I639 Cerebral infarction, unspecified: Secondary | ICD-10-CM | POA: Diagnosis not present

## 2020-08-03 NOTE — Progress Notes (Signed)
[facility] Clinic Note - Initial Visit   Date: 08/03/20  Tina Horton MRN: [id number] DOB: 1965/05/22   Dear Tina Bode, PA-C:  Thank you for your kind referral of Tina Horton for consultation of stroke. Although her history is well known to you, please allow [place] to reiterate it for the purpose of our medical record. The patient was accompanied to the clinic by husband who also provides collateral information.     History of Present Illness: Tina Horton is a 55 y.o. right-handed female with insulin-dependent diabetes mellitus, hyperlipidemia, hypertension, severe myopia and glaucoma - essentially legally blind but able to see light OU presenting for evaluate of stroke manifesting with imbalance and left arm and leg weakness. She presented to the ER on 3/8 with imbalance and was discharged. On 3/10, she woke up with left arm and leg weakness with worsening imbalance.  She was taken to the ER where CT head showed acute right basal ganglia and corona radiata stroke.  MRI brain confirmed the presence of the stroke, MRA head and US carotid was normal.   NIHSS 2.  She did not receive tPA.  Etiology suspect to be small vessel disease. She was started on atorvastatin 59m and dual antiplatelet therapy with aspirin 822m+ plavix 7516maily.  She was taking aspirin 1m57meviously.  Her blood pressure was noted to be elevated (170-180s).    Since she has been home, her left side weakness is improving.  She has some weakness with the left hand with coordination. She continues to have some imbalance and is very careful when walking.  At home she has someone next to her. She has not started PT yet.  She denies any numbness/tingling, dysphagia, or dysarthria.  Her vision is impaired at baseline, no new changes.    Out-side paper records, electronic medical record, and images have been reviewed where available and summarized as:  CT head 07/27/20: Acute  infarct in the right basal ganglia, extending into overlying corona radiata. Mild edema without mass effect.  MRI head 07/27/20:   1. 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 Associated edema without mass effect. 2. Bilateral posterior staphylomas.  MRA head 07/27/2020:   Normal intracranial MR angiography.  Echo 07/28/20:  EF 70-75%   US cKoreaotids 07/28/20:  1-39% bilaterally   Lab Results  Component Value Date   CHOL 228 (H) 07/24/2020   HDL 48 07/24/2020   LDLCALC 165 (H) 07/24/2020   TRIG 83 07/24/2020   CHOLHDL 4.8 (H) 07/24/2020    Lab Results  Component Value Date   HGBA1C 8.5 (H) 07/24/2020   [facility] results found [facility]: [facility]  Component Value Date   TSH 0.861 07/26/2020   Lab Results  Component Value Date   ESRSEDRATE 17 12/19/2017    Past Medical History:  Diagnosis Date  . Anemia   . Arthritis    knees  . Bronchitis   . [facility] ([facility] (gastroesophageal reflux disease)   . Glaucoma    Dr. McFaEinar GipH/O mammogram 2005  . Headache   . Heart murmur   . Hyperlipidemia   . Hypertension   . Intermittent palpitations   . Legally blind   . Myopia   . Pneumonia   . Routine gynecological examination    last pap 2005  . Seasonal allergies   . Shortness of breath  dyspnea   . Sinusitis   . Thyroid nodule     Past Surgical History:  Procedure Laterality Date  . CYSTECTOMY     umbilicus  . INCISION AND DRAINAGE     abdominal superficial abscess  . LAPAROSCOPIC VAGINAL HYSTERECTOMY WITH SALPINGECTOMY Bilateral 09/13/2015   Procedure: LAPAROSCOPIC ASSISTED VAGINAL HYSTERECTOMY WITH SALPINGECTOMY, McCalls Colpoplasty;  Surgeon: Thurnell Lose, MD;  Location: [facility];  Service: Gynecology;  Laterality: Bilateral;  . LASIK     x2     Medications:  Outpatient Encounter Medications as of 08/03/2020  Medication Sig Note  . Alcohol Swabs (B-D SINGLE USE SWABS  REGULAR) PADS USE  WHEN  TESTING BLOOD SUGAR   . aspirin EC 81 MG tablet Take 1 tablet (81 mg total) by mouth daily.   [name] atorvastatin (LIPITOR) 80 MG tablet Take 1 tablet (80 mg total) by mouth daily.   . Blood Glucose Monitoring Suppl (PRODIGY AUTOCODE BLOOD GLUCOSE) w/Device KIT    . carvedilol (COREG) 25 MG tablet Take 1 tablet (25 mg total) by mouth 2 (two) times daily.   . clopidogrel (PLAVIX) 75 MG tablet Take 1 tablet (75 mg total) by mouth daily.   . dorzolamide-timolol (COSOPT) 22.3-6.8 MG/ML ophthalmic solution Place 1 drop into both eyes 2 (two) times daily.   [name] glucose blood (PRODIGY NO CODING BLOOD GLUC) test strip Use as instructed   . Insulin Pen Needle (NOVOFINE PLUS) 32G X 4 MM MISC Use 4 times a day with Lantus Insulin   . latanoprost (XALATAN) 0.005 % ophthalmic solution Place 1 drop into both eyes at bedtime.   [name] losartan (COZAAR) 100 MG tablet Take 1 tablet (100 mg total) by mouth daily.   [name] NOVOLOG FLEXPEN 100 UNIT/ML FlexPen INJECT 20 UNITS SUBCUTANEOUSLY THREE TIMES DAILY WITH MEALS (USE SLIDING SCALE PROVIDED BY MD)(PEN EXPIRES AT 28 DAYS) (Patient taking differently: Inject 16 Units into the skin 3 (three) times daily with meals.) 07/27/2020: Self-monitoring blood glucose at home  . PRODIGY NO CODING BLOOD GLUC test strip TEST BLOOD SUGAR THREE TIMES DAILY   . Prodigy Twist Top Lancets 28G MISC USE TO TEST BLOOD SUGAR THREE TIMES DAILY   . VITAMIN D-VITAMIN K PO Take by mouth 2 (two) times a week.   . [DISCONTINUED] cholecalciferol (VITAMIN D3) 25 MCG (1000 UNIT) tablet Take 1,000 Units by mouth See admin instructions. Take 1 tablet (1000 units totally) by mouth every day; except take 5000 units totally by mouth on Tuesday and Friday   . [DISCONTINUED] LEVEMIR FLEXTOUCH 100 UNIT/ML FlexPen INJECT 40 UNITS SUBCUTANEOUSLY AT BEDTIME (Patient taking differently: Inject 38 Units into the skin at bedtime.) 07/27/2020: Self-monitoring blood glucose at home   No  facility-administered encounter medications on file as of 08/03/2020.    Allergies:  Allergies  Allergen Reactions  . Amlodipine Swelling    Swelling   . Metformin And Related Diarrhea and Nausea And Vomiting  . Tanzeum [Albiglutide] Nausea And Vomiting  . Lisinopril Other (See Comments)    Refuses, says it causes cancer    Family History: Family History  Problem Relation Age of Onset  . Diabetes Mother   . Hypertension Mother   . Stroke Mother   . Hypertension Father   . Chronic Renal Failure Father   . Hypertension Sister   . Hypertension Brother   . Stroke Maternal Grandmother   . Asthma Daughter   . Healthy Daughter   . Healthy Son   . Cancer Neg Hx   .  Heart disease Neg Hx     Social History: Social History   Tobacco Use  . Smoking status: Never Smoker  . Smokeless tobacco: Never Used  Vaping Use  . Vaping Use: Never used  Substance Use Topics  . Alcohol use: No    Alcohol/week: 0.0 standard drinks  . Drug use: No   Social History   Social History Narrative   Married, has 3 children, not exercising, but walks at work.     Lives in a one story home.     On disability.  Education: some college.      Update: No longer working, active in the home. Still on disability   Right handed     Vital Signs:  BP 132/81   Pulse 81   Ht _0  (1.549 m)   Wt 135 lb (61.2 kg)   LMP 09/13/2015   SpO2 99%   BMI 25.51 kg/m    General Medical Exam:   General:  Well appearing, comfortable.   Eyes/ENT: see cranial nerve examination.   Neck:   No carotid bruits. Respiratory:  Clear to auscultation, good air entry bilaterally.   Cardiac:  Regular rate and rhythm, no murmur.   Extremities:  No deformities, edema, or skin discoloration.  Skin:  No rashes or lesions.  Neurological Exam: MENTAL STATUS including orientation to time, place, person, recent and remote memory, attention span and concentration, language, and fund of knowledge is normal.  Speech is not  dysarthric.  CRANIAL NERVES: II:  Light perception only in the left eye, gross movements are perceivable in the right eye III-IV-VI: Pupils equal round and reactive to light.  Mild baseline left eye esotropia. Normal conjugate, extra-ocular eye movements in all directions of gaze.  No nystagmus.  No ptosis.   V:  Normal facial sensation.    VII:  Normal facial symmetry and movements.   VIII:  Normal hearing and vestibular function.   IX-X:  Normal palatal movement.   XI:  Normal shoulder shrug and head rotation.   XII:  Normal tongue strength and range of motion, no deviation or fasciculation.  MOTOR:  No atrophy, fasciculations or abnormal movements.  Mild left pronator drift.   Upper Extremity:  Right  Left  Deltoid  5/5   5/5   Biceps  5/5   5/5   Triceps  5/5   5/5   Infraspinatus 5/5  5/5  Medial pectoralis 5/5  5/5  Wrist extensors  5/5   5/5   Wrist flexors  5/5   5/5   Finger extensors  5/5   5/5   Finger flexors  5/5   5/5   Dorsal interossei  5/5   5/5   Abductor pollicis  5/5   5/5   Tone (Ashworth scale)  0  0   Lower Extremity:  Right  Left  Hip flexors  5/5   5/5   Hip extensors  5/5   5/5   Adductor 5/5  5/5  Abductor 5/5  5/5  Knee flexors  5/5   5/5   Knee extensors  5/5   5/5   Dorsiflexors  5/5   5/5   Plantarflexors  5/5   5/5   Toe extensors  5/5   5/5   Toe flexors  5/5   5/5   Tone (Ashworth scale)  0  0   MSRs:  Right        Left  brachioradialis 2+  2+  biceps 2+  2+  triceps 2+  2+  patellar 2+  2+  ankle jerk 2+  2+  Hoffman no  no  plantar response down  down   SENSORY:  Normal and symmetric perception of light touch, pinprick, vibration, and proprioception.  Romberg's sign  COORDINATION/GAIT: Dysmetria with finger to nose testing on the left. Finger tapping is slowed on the left. Gait is tested unassisted and shows some imbalance, veering to the left.    IMPRESSION: Right basal ganglia stroke manifesting with  hemiataxia and hemiparesis, slowly improving.  Etiology - small vessel disease.  Management is optimizing risk factors such as diabetes, hypertension, and hyperlipidemia  - Continue dual antiplatelet with Plavix 53m + aspiring 822mx 3 weeks, then aspirin 8141mlone  - Continue high dose atorvastatin 19m39mily (LDL 165)  - Continue diabetes and BP management as her PCP, home monitor shows BP ranging in 130s  - Start OT and PT   Return to clinic in 6 months.  Thank you for allowing me to participate in patient's care.  If I can answer any additional questions, I would be pleased to do so.    Sincerely,    Paxton Binns K. PatePosey Pronto

## 2020-08-03 NOTE — Telephone Encounter (Signed)
Patient wants to be sure it is okay to have her hair wash and styled at a salon after having her stroke.

## 2020-08-03 NOTE — Patient Instructions (Signed)
Continue out-patient physical and occupational therapy  Continue medications as you are taking  Encouraged to get COVID vaccine  Return to clinic in 6 months

## 2020-08-03 NOTE — Telephone Encounter (Signed)
Patient was seen this morning and called back stating she forgot to talk with the doctor about taking vitamin D.

## 2020-08-04 ENCOUNTER — Ambulatory Visit (INDEPENDENT_AMBULATORY_CARE_PROVIDER_SITE_OTHER): Payer: Medicare HMO

## 2020-08-04 DIAGNOSIS — I6302 Cerebral infarction due to thrombosis of basilar artery: Secondary | ICD-10-CM

## 2020-08-04 DIAGNOSIS — I639 Cerebral infarction, unspecified: Secondary | ICD-10-CM | POA: Diagnosis not present

## 2020-08-04 DIAGNOSIS — I4891 Unspecified atrial fibrillation: Secondary | ICD-10-CM

## 2020-08-04 DIAGNOSIS — R002 Palpitations: Secondary | ICD-10-CM | POA: Diagnosis not present

## 2020-08-04 NOTE — Telephone Encounter (Signed)
That would be fine 

## 2020-08-04 NOTE — Telephone Encounter (Signed)
Pt called and informed its ok to have her hair wash and styled at a salon after having her stroke and its ok to take her vit d with K per dr Posey Pronto

## 2020-08-07 ENCOUNTER — Other Ambulatory Visit: Payer: Self-pay

## 2020-08-07 ENCOUNTER — Other Ambulatory Visit: Payer: Self-pay | Admitting: Medical

## 2020-08-07 ENCOUNTER — Telehealth: Payer: Self-pay | Admitting: Neurology

## 2020-08-07 ENCOUNTER — Encounter: Payer: Self-pay | Admitting: Medical

## 2020-08-07 ENCOUNTER — Ambulatory Visit (INDEPENDENT_AMBULATORY_CARE_PROVIDER_SITE_OTHER): Payer: Medicare HMO | Admitting: Medical

## 2020-08-07 VITALS — BP 146/84 | HR 84 | Ht 66.0 in | Wt 136.8 lb

## 2020-08-07 DIAGNOSIS — E118 Type 2 diabetes mellitus with unspecified complications: Secondary | ICD-10-CM

## 2020-08-07 DIAGNOSIS — Z8673 Personal history of transient ischemic attack (TIA), and cerebral infarction without residual deficits: Secondary | ICD-10-CM | POA: Diagnosis not present

## 2020-08-07 DIAGNOSIS — R0683 Snoring: Secondary | ICD-10-CM

## 2020-08-07 DIAGNOSIS — E041 Nontoxic single thyroid nodule: Secondary | ICD-10-CM

## 2020-08-07 DIAGNOSIS — Z1211 Encounter for screening for malignant neoplasm of colon: Secondary | ICD-10-CM

## 2020-08-07 DIAGNOSIS — Z90710 Acquired absence of both cervix and uterus: Secondary | ICD-10-CM | POA: Insufficient documentation

## 2020-08-07 DIAGNOSIS — J3489 Other specified disorders of nose and nasal sinuses: Secondary | ICD-10-CM | POA: Diagnosis not present

## 2020-08-07 DIAGNOSIS — E1122 Type 2 diabetes mellitus with diabetic chronic kidney disease: Secondary | ICD-10-CM | POA: Diagnosis not present

## 2020-08-07 DIAGNOSIS — I10 Essential (primary) hypertension: Secondary | ICD-10-CM | POA: Diagnosis not present

## 2020-08-07 DIAGNOSIS — R0981 Nasal congestion: Secondary | ICD-10-CM

## 2020-08-07 DIAGNOSIS — R531 Weakness: Secondary | ICD-10-CM

## 2020-08-07 DIAGNOSIS — IMO0002 Reserved for concepts with insufficient information to code with codable children: Secondary | ICD-10-CM

## 2020-08-07 DIAGNOSIS — N182 Chronic kidney disease, stage 2 (mild): Secondary | ICD-10-CM | POA: Diagnosis not present

## 2020-08-07 DIAGNOSIS — I69354 Hemiplegia and hemiparesis following cerebral infarction affecting left non-dominant side: Secondary | ICD-10-CM | POA: Diagnosis not present

## 2020-08-07 DIAGNOSIS — R002 Palpitations: Secondary | ICD-10-CM | POA: Diagnosis not present

## 2020-08-07 DIAGNOSIS — Z794 Long term (current) use of insulin: Secondary | ICD-10-CM

## 2020-08-07 DIAGNOSIS — Z9119 Patient's noncompliance with other medical treatment and regimen: Secondary | ICD-10-CM | POA: Diagnosis not present

## 2020-08-07 DIAGNOSIS — E782 Mixed hyperlipidemia: Secondary | ICD-10-CM | POA: Diagnosis not present

## 2020-08-07 DIAGNOSIS — Z91199 Patient's noncompliance with other medical treatment and regimen due to unspecified reason: Secondary | ICD-10-CM

## 2020-08-07 DIAGNOSIS — Z1231 Encounter for screening mammogram for malignant neoplasm of breast: Secondary | ICD-10-CM

## 2020-08-07 DIAGNOSIS — E1165 Type 2 diabetes mellitus with hyperglycemia: Secondary | ICD-10-CM

## 2020-08-07 NOTE — Telephone Encounter (Signed)
Pt notified and is seeing her PCP today for an appointment.

## 2020-08-07 NOTE — Patient Instructions (Addendum)
Finish out event cardiac monitor and turn in to cardiology   You just saw Dr. Patel, neurology last week, next follow up is  6 months   Diabetes specialist/endocrinologist   You should be hearing back for appointment time soon as we have made referral regarding diabetes and thyroid nodule   Left side weakness, recent stroke  continue with occupational and physical therapy   Screen for colon cancer  We will make referral for Cologard stool kit test  Follow the instructions that come in the box   Screen for breast cancer At your convenience, please call to schedule your mammogram.   [facility]  [phone] [address], [address] , [place] [address]   Continue Aspirin + Plavix until 08/19/2020, then you will stop the Plavix at that time, and just use Aspirin instead of both.  Continue ALL other medicaiton.     We will call back with lab results     

## 2020-08-07 NOTE — Telephone Encounter (Signed)
Please notify pt to inform her PCP who is managing her blood pressure to see if any changes need to be made.

## 2020-08-07 NOTE — Progress Notes (Signed)
Subjective:  Tina Horton is a 55 y.o. female who presents for Chief Complaint  Patient presents with  . Hospitalization Follow-up     Here today for hospital f/u.  Medical team: Lonie Peak, ophthalmology Dr. Quay Burow, cardiology Dr. Basil Dess, orthopedics Dr. Noel Journey, nephrology Dr. Bo Merino, rheumatology Dr. Narda Amber, neurology Dr. Raynelle Bring, urology Dr. Jacqualyn Posey ,[facility], gynecology   Since last visit last week runny nose and congestion has resolved or improved.  Here for follow up from recent hospitalization  No new changes since last week.     Of note I reviewed her hospital record: She was just discharged from the hospital March 11 here locally.  She was admitted July 27, 2020 through July 28, 2020 for acute stroke.  She was advised to follow-up within 1 to 2 weeks including repeat competency metabolic and CBC, and is supposed to have follow-up with cardiology and neurology as well as 30-day cardiac monitoring.   Was advised aspirin plus Plavix for 3 weeks then aspirin only  Thyroid nodule to be further evaluated by endocrinology  Has left sided weakness from recent stroke  Still has another week on the cardiac monitor she is using from Dr. Gwenlyn Found  No other aggravating or relieving factors.    No other c/o.  Past Medical History:  Diagnosis Date  . Anemia   . Arthritis    knees  . Bronchitis   . Diabetes mellitus without complication [facility])    age 23yo  . GERD (gastroesophageal reflux disease)   . Glaucoma    Dr. Einar Gip  . H/O mammogram 2005  . Headache   . Heart murmur   . Hyperlipidemia   . Hypertension   . Intermittent palpitations   . Legally blind   . Myopia   . Pneumonia   . Routine gynecological examination    last pap 2005  . Seasonal allergies   . Shortness of breath dyspnea   . Sinusitis   . Thyroid nodule    Current Outpatient Medications on File Prior to Visit  Medication Sig Dispense  Refill  . Alcohol Swabs (B-D SINGLE USE SWABS REGULAR) PADS USE  WHEN  TESTING BLOOD SUGAR 300 each 0  . aspirin EC 81 MG tablet Take 1 tablet (81 mg total) by mouth daily. 90 tablet 3  . atorvastatin (LIPITOR) 80 MG tablet Take 1 tablet (80 mg total) by mouth daily. 30 tablet 0  . Blood Glucose Monitoring Suppl (PRODIGY AUTOCODE BLOOD GLUCOSE) w/Device KIT     . carvedilol (COREG) 25 MG tablet Take 1 tablet (25 mg total) by mouth 2 (two) times daily. 180 tablet 3  . clopidogrel (PLAVIX) 75 MG tablet Take 1 tablet (75 mg total) by mouth daily. 30 tablet 0  . dorzolamide-timolol (COSOPT) 22.3-6.8 MG/ML ophthalmic solution Place 1 drop into both eyes 2 (two) times daily.    [name] glucose blood (PRODIGY NO CODING BLOOD GLUC) test strip Use as instructed 100 each 12  . Insulin Pen Needle (NOVOFINE PLUS) 32G X 4 MM MISC Use 4 times a day with Lantus Insulin 1000 each 0  . latanoprost (XALATAN) 0.005 % ophthalmic solution Place 1 drop into both eyes at bedtime.    [name] losartan (COZAAR) 100 MG tablet Take 1 tablet (100 mg total) by mouth daily. 90 tablet 3  . NOVOLOG FLEXPEN 100 UNIT/ML FlexPen INJECT 20 UNITS SUBCUTANEOUSLY THREE TIMES DAILY WITH MEALS (USE SLIDING SCALE PROVIDED BY MD)(PEN EXPIRES AT 28 DAYS) (Patient taking  differently: Inject 16 Units into the skin 3 (three) times daily with meals.) 60 mL 5  . PRODIGY NO CODING BLOOD GLUC test strip TEST BLOOD SUGAR THREE TIMES DAILY 300 strip 0  . Prodigy Twist Top Lancets 28G MISC USE TO TEST BLOOD SUGAR THREE TIMES DAILY 300 each 0  . VITAMIN D-VITAMIN K PO Take by mouth 2 (two) times a week.     No current facility-administered medications on file prior to visit.    The following portions of the patient's history were reviewed and updated as appropriate: allergies, current medications, past family history, past medical history, past social history, past surgical history and problem list.  ROS Otherwise as in subjective above    Objective: BP  (!) 160/80   Pulse 84   Ht _0  (1.676 m)   Wt 136 lb 12.8 oz (62.1 kg)   LMP 09/13/2015   SpO2 97%   BMI 22.08 kg/m   Wt Readings from Last 3 Encounters:  08/07/20 136 lb 12.8 oz (62.1 kg)  08/03/20 135 lb (61.2 kg)  08/01/20 134 lb 6.4 oz (61 kg)   BP Readings from Last 3 Encounters:  08/07/20 (!) 160/80  08/03/20 132/81  08/01/20 136/80    General appearance: alert, no distress, well developed, well nourished HEENT: normocephalic, sclerae anicteric, conjunctiva pink and moist, TMs pearly, nares patent, no discharge, no erythema, pharynx normal Oral cavity: MMM, no lesions Neck: supple, no lymphadenopathy, no thyromegaly, no masses Heart: RRR, normal S1, S2, no murmurs Lungs: CTA bilaterally, no wheezes, rhonchi, or rales Pulses: 2+ radial pulses, 2+ pedal pulses, normal cap refill Ext: no edema 4/5 weakness left upper and lower extremity, somewhat unsteady on feet, cautious with gait,  otherwise nonfocal exam    Assessment: Encounter Diagnoses  Name Primary?  . History of recent stroke Yes  . Essential hypertension   . CKD (chronic kidney disease) stage 2, GFR 60-89 ml/min   . Mixed hyperlipidemia   . Palpitation   . Noncompliance   . Sinus pressure   . Thyroid nodule   . Uncontrolled diabetes mellitus with complication, with long-term current use of insulin ([place])   . Left-sided weakness   . Screen for colon cancer   . Encounter for screening mammogram for malignant neoplasm of breast   . S/P hysterectomy   . Nasal congestion   . Snoring      Plan: i reviewed her recent hospital discharge notes, medications reconciled.  I reviewed chest x-ray from July 25, 2020 showing normal, no obvious abnormality I reviewed stat Chem-8 from March 10 showing glucose 192 otherwise normal I reviewed urine drug screen from March 10 that was normal I reviewed CBC from 07/27/2020 that was normal  Referral to occupational therapy.   She has already begun physical therapy  for left sided weakness s/p recent CVA  She has f/u appt scheduled with cardiology  I reviewed her neurology appt notes from last week 08/04/19 with Dr. Posey Pronto  She will follow with endocrinology new patient visit soon.   Of note, I am acting as PCP to help coordinate care.   She has been noncompliant with my recommendations.   Thus, I am deferring BP management to cardiology and diabetes management to endocrinology.       Reviewing chart records, I reviewed some other needed follow up and care  Screen for mammogram - order placed for mammogram.    Advised she call and schedule at her convenience  Screen for colon cancer -  no prior screening.  Referral for Cologard  Snoring - she declined to move forward with sleep study at this time    Hartleigh was seen today for hospitalization follow-up.  Diagnoses and all orders for this visit:  History of recent stroke -     Comprehensive metabolic panel -     CBC  Essential hypertension -     Comprehensive metabolic panel -     CBC  CKD (chronic kidney disease) stage 2, GFR 60-89 ml/min -     Comprehensive metabolic panel -     CBC  Mixed hyperlipidemia  Palpitation  Noncompliance  Sinus pressure  Thyroid nodule  Uncontrolled diabetes mellitus with complication, with long-term current use of insulin ([facility])  Left-sided weakness  Screen for colon cancer  Encounter for screening mammogram for malignant neoplasm of breast  S/P hysterectomy  Nasal congestion  Snoring    Follow up: pending consults with cardiology, endocrinology, therapy

## 2020-08-07 NOTE — Telephone Encounter (Signed)
Fyi.

## 2020-08-07 NOTE — Addendum Note (Signed)
Addended by: Edgar Frisk on: 08/07/2020 03:30 PM   Modules accepted: Orders

## 2020-08-08 ENCOUNTER — Other Ambulatory Visit: Payer: Self-pay

## 2020-08-08 DIAGNOSIS — Z8673 Personal history of transient ischemic attack (TIA), and cerebral infarction without residual deficits: Secondary | ICD-10-CM

## 2020-08-08 DIAGNOSIS — R531 Weakness: Secondary | ICD-10-CM

## 2020-08-08 DIAGNOSIS — R002 Palpitations: Secondary | ICD-10-CM | POA: Diagnosis not present

## 2020-08-08 LAB — COMPREHENSIVE METABOLIC PANEL
ALT: 16 IU/L (ref 0–32)
AST: 17 IU/L (ref 0–40)
Albumin/Globulin Ratio: 1.7 (ref 1.2–2.2)
Albumin: 4.4 g/dL (ref 3.8–4.9)
Alkaline Phosphatase: 79 IU/L (ref 44–121)
BUN/Creatinine Ratio: 11 (ref 9–23)
BUN: 10 mg/dL (ref 6–24)
Bilirubin Total: 1 mg/dL (ref 0.0–1.2)
CO2: 24 mmol/L (ref 20–29)
Calcium: 10 mg/dL (ref 8.7–10.2)
Chloride: 102 mmol/L (ref 96–106)
Creatinine, Ser: 0.93 mg/dL (ref 0.57–1.00)
Globulin, Total: 2.6 g/dL (ref 1.5–4.5)
Glucose: 111 mg/dL — ABNORMAL HIGH (ref 65–99)
Potassium: 4.6 mmol/L (ref 3.5–5.2)
Sodium: 142 mmol/L (ref 134–144)
Total Protein: 7 g/dL (ref 6.0–8.5)
eGFR: 73 mL/min/{1.73_m2} (ref >59)

## 2020-08-08 LAB — CBC
Hematocrit: 35.1 % (ref 34.0–46.6)
Hemoglobin: 11.4 g/dL (ref 11.1–15.9)
MCH: 29.2 pg (ref 26.6–33.0)
MCHC: 32.5 g/dL (ref 31.5–35.7)
MCV: 90 fL (ref 79–97)
Platelets: 235 10*3/uL (ref 150–450)
RBC: 3.9 x10E6/uL (ref 3.77–5.28)
RDW: 13 % (ref 11.7–15.4)
WBC: 3.9 10*3/uL (ref 3.4–10.8)

## 2020-08-09 ENCOUNTER — Ambulatory Visit: Payer: Medicare HMO

## 2020-08-09 ENCOUNTER — Other Ambulatory Visit: Payer: Self-pay

## 2020-08-09 VITALS — BP 146/84 | HR 76

## 2020-08-09 DIAGNOSIS — R278 Other lack of coordination: Secondary | ICD-10-CM | POA: Diagnosis not present

## 2020-08-09 DIAGNOSIS — M6281 Muscle weakness (generalized): Secondary | ICD-10-CM

## 2020-08-09 DIAGNOSIS — R2681 Unsteadiness on feet: Secondary | ICD-10-CM | POA: Diagnosis not present

## 2020-08-09 DIAGNOSIS — R2689 Other abnormalities of gait and mobility: Secondary | ICD-10-CM

## 2020-08-09 DIAGNOSIS — I69354 Hemiplegia and hemiparesis following cerebral infarction affecting left non-dominant side: Secondary | ICD-10-CM | POA: Diagnosis not present

## 2020-08-09 DIAGNOSIS — H5410 Blindness, one eye, low vision other eye, unspecified eyes: Secondary | ICD-10-CM | POA: Diagnosis not present

## 2020-08-09 NOTE — Therapy (Signed)
[facility] [address] [place], [place], [address] Phone: [phone]   Fax:  [phone]  Physical Therapy Treatment  Patient Details  Name: Tina Horton MRN: [id number] Date of Birth: 1966/04/06 Referring Provider (PT): Referred by hospitalist; will send certification to PCP: Carlena Hurl, PA-C   Encounter Date: 08/09/2020   PT End of Session - 08/09/20 1106    Visit Number 2    Number of Visits 9    Date for PT Re-Evaluation 08/31/20    Authorization Type [employer]    PT Start Time 0924    PT Stop Time 1013    PT Time Calculation (min) 49 min    Activity Tolerance Patient tolerated treatment well    Behavior During Therapy [facility] for tasks assessed/performed           Past Medical History:  Diagnosis Date  . Anemia   . Arthritis    knees  . Bronchitis   . Diabetes mellitus without complication [facility])    age 55yo  . GERD (gastroesophageal reflux disease)   . Glaucoma    Dr. Einar Gip  . H/O mammogram 2005  . Headache   . Heart murmur   . Hyperlipidemia   . Hypertension   . Intermittent palpitations   . Legally blind   . Myopia   . Pneumonia   . Routine gynecological examination    last pap 2005  . Seasonal allergies   . Shortness of breath dyspnea   . Sinusitis   . Thyroid nodule     Past Surgical History:  Procedure Laterality Date  . CYSTECTOMY     umbilicus  . INCISION AND DRAINAGE     abdominal superficial abscess  . LAPAROSCOPIC VAGINAL HYSTERECTOMY WITH SALPINGECTOMY Bilateral 09/13/2015   Procedure: LAPAROSCOPIC ASSISTED VAGINAL HYSTERECTOMY WITH SALPINGECTOMY, McCalls Colpoplasty;  Surgeon: Thurnell Lose, MD;  Location: [facility];  Service: Gynecology;  Laterality: Bilateral;  . LASIK     x2    Vitals:   08/09/20 0926 08/09/20 1005  BP: 138/82 (!) 146/84  Pulse: 84 76  SpO2: 99% 100%     Subjective Assessment - 08/09/20 0929    Subjective Pt reports feeling  good and feels like she is stronger and able to walk better.  She got a new heart monitor placed since last visit that she will be wearing for the next month.  She is concerned about her BP since this that was the cause of her stroke.    Patient is accompained by: Family member    Pertinent History anemia, OA in knees, DM, glaucoma, heart murmur, HLD, HTN, palpitations, legally blind, PNA, thyroid nodule, sinusitis    Limitations Walking    Currently in Pain? No/denies                             [facility] Adult PT Treatment/Exercise - 08/09/20 1118      Transfers   Sit to Stand 5: Supervision;Without upper extremity assist;Other (comment)   from mat   Sit to Stand Details Tactile cues for weight shifting;Tactile cues for placement;Verbal cues for technique    Sit to Stand Details (indicate cue type and reason) x5 regular stance; 2x10 staggered stance; verbal cueing to maintain trunk in neutral position versus L trunk lean, pressing feet into ground to stand, focusing on extending through L hip when standing      Ambulation/Gait   Ambulation/Gait Yes  Ambulation/Gait Assistance 5: Supervision    Ambulation/Gait Assistance Details Without blind cane, stand by for verbal cueing about turning and avoiding objects.  No signs of instability, slow gait speed and more narrow base of support.    Ambulation Distance (Feet) 230 Feet    Assistive device None    Gait Pattern Step-through pattern;Decreased arm swing - right;Decreased arm swing - left;Decreased hip/knee flexion - left;Narrow base of support    Ambulation Surface Level;Indoor               Balance Exercises - 08/09/20 1123      Balance Exercises: Standing   Standing Eyes Opened Narrow base of support (BOS);Wide (BOA);Foam/compliant surface;Limitations;Other (comment)   Narrow BOS on 1 pillow in corner 2x30 sec; standing  wide BOS on airex with lateral weightshifting 2x10 each.   Standing Eyes Opened Limitations  Instability noted on weightshift to L.  Verbal and tactile ceuing required for proper pelvic and turnk movement.  Pt performed better when regained balance in static standing positon before weightshifting.    Tandem Stance Limitations;Other (comment)   standing in corner, without UE support, x30sec each foot   Tandem Stance Time No significant postural sway noted as compared to inital eval    Stepping Strategy Anterior;10 reps;Limitations   2x10, taps onto 6in box   Stepping Strategy Limitations verbal ceuing to weight shift before stepping; pt displayed difficulty clearing foot during eccentric portion of movement    Marching Solid surface;Intermittent upper extremity assist;Static;Limitations   2x20 with 1 second hold and proper weightshifting   Marching Limitations Instability noted during L stance; verbal and tactile ceuing required for proper lateral pelvic and trunk shifting.  Weakness noted with difficulties in lifting LLE in high hip flexed position             PT Education - 08/09/20 1105    Education Details Initial HEP, practicing proper weightshift when doing any sit to stand around her house, walking in the community with family    Person(s) Educated Patient    Methods Explanation;Handout    Comprehension Verbalized understanding               PT Long Term Goals - 08/01/20 1728      PT LONG TERM GOAL #1   Title Pt will demonstrate ability to safely perform HEP with family's supervision    Time 4    Period Weeks    Status New    Target Date 08/31/20      PT LONG TERM GOAL #2   Title Pt will decrease five time sit to stand to </=20 seconds with supervision without pushing back of knees against mat for stability    Baseline 45.44 sec; weight shift posterior and to the left, contact with mat on posterior portion of knee during all reps    Time 4    Period Weeks    Status New    Target Date 08/31/20      PT LONG TERM GOAL #3   Title Pt will increase gait velocity  using blind cane to 0.8 m/sec to indicate decreased falls risk in the community    Baseline .37 m/sec    Time 4    Period Weeks    Status New    Target Date 08/31/20      PT LONG TERM GOAL #4   Title Pt will increase BERG balance score to >/= 41/56    Baseline 33/56    Time 4  Period Weeks    Status New    Target Date 08/31/20      PT LONG TERM GOAL #5   Title Pt will ambulate x 230' over level, indoor surfaces and around various obstacles with use of blind cane and supervision with no evidence of LOB to the L    Time 4    Period Weeks    Status New    Target Date 08/31/20                 Plan - 08/09/20 1107    Clinical Impression Statement Began initial exercises to address proper weightshiting during standing and transfers, lower leg strengthening and balance exercises.  Compensations noted during sit to stand that were addressed with verbal cueing and staggered stance.  Pt disaplys instabity during compliant surface weightshifting and has difficulties maintaining a neutral pelvis during pre-gait activities that were fixed with tactile and verbal cueing.  Pts BP was monitored before and after session and no signs of abnormal readings were noted.    Personal Factors and Comorbidities Comorbidity 3+;Past/Current Experience    Comorbidities anemia, OA in knees, DM, glaucoma, heart murmur, HLD, HTN, palpitations, legally blind, PNA, thyroid nodule, sinusitis    Examination-Activity Limitations Bend;Lift;Locomotion Level;Reach Overhead;Stairs;Stand;Transfers    Examination-Participation Restrictions Cleaning;Community Activity;Meal Prep;Shop    Stability/Clinical Decision Making Evolving/Moderate complexity    Rehab Potential Good    PT Frequency 2x / week    PT Duration 4 weeks    PT Treatment/Interventions ADLs/Self Care Home Management;DME Instruction;Gait training;Stair training;Functional mobility training;Therapeutic activities;Therapeutic exercise;Balance  training;Neuromuscular re-education;Patient/family education    PT Next Visit Plan Assess Vitals.  Continue LLE strengthening, standing balance, active weight shifting and balance reactions; rockerboard and stepping exercises.  Working towards gait with blind cane and supervision.    Consulted and Agree with Plan of Care Patient;Family member/caregiver    Family Member Consulted husband           Patient will benefit from skilled therapeutic intervention in order to improve the following deficits and impairments:  Decreased balance,Decreased coordination,Abnormal gait,Decreased strength,Difficulty walking,Impaired UE functional use  Visit Diagnosis: Hemiplegia and hemiparesis following cerebral infarction affecting left non-dominant side (HCC)  Unsteadiness on feet  Other abnormalities of gait and mobility  Muscle weakness     Problem List Patient Active Problem List   Diagnosis Date Noted  . History of recent stroke 08/07/2020  . Left-sided weakness 08/07/2020  . Screen for colon cancer 08/07/2020  . Encounter for screening mammogram for malignant neoplasm of breast 08/07/2020  . S/P hysterectomy 08/07/2020  . Nasal congestion 08/07/2020  . Snoring 08/07/2020  . CVA (cerebral vascular accident) ([place]) 07/27/2020  . Acute kidney injury superimposed on chronic kidney disease ([place]) 07/27/2020  . Palpitation 07/24/2020  . History of COVID-19 05/22/2020  . COVID-19 virus infection 02/25/2020  . Sinus pressure 02/25/2020  . Acute non-recurrent frontal sinusitis 02/25/2020  . Chronic pain of both knees 01/04/2020  . 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 CKD (chronic kidney disease) stage 2, GFR 60-89 ml/min 11/29/2019  . 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 Vitamin D deficiency 03/13/2018  . Primary osteoarthritis of both knees 01/20/2018  . DDD (degenerative disc disease), lumbar 01/20/2018  . Localized swelling, mass or lump of neck 10/30/2017  . Screening for cancer 08/20/2017  .  Left leg pain 08/20/2017  . Renal cyst 01/15/2016  . Hydronephrosis of left kidney 01/15/2016  . Spinal stenosis 01/15/2016  . History of burning pain in leg 12/04/2015  .  S/P laparoscopic assisted vaginal hysterectomy (LAVH) 09/13/2015  . Encounter for health maintenance examination in adult 08/21/2015  . Hyperlipidemia 08/21/2015  . Glaucoma 08/21/2015  . Heart murmur 08/21/2015  . Thyroid nodule 08/21/2015  . Vaccine refused by patient 08/21/2015  . Noncompliance 03/21/2015  . Essential hypertension 03/21/2015  . Uncontrolled diabetes mellitus with complication, with long-term current use of insulin ([place]) 03/21/2015  . Lymphadenitis 03/21/2015  . SKIN LESION 10/19/2008  . SYSTOLIC MURMUR 09/31/1216  . VISUAL DISTURBANCE NOS 07/17/2006  . REFLUX ESOPHAGITIS 07/17/2006    Yetta Numbers, SPT 08/09/2020, 11:31 AM  [facility] [address] [place], [place], [address] Phone: [phone]   Fax:  [phone]  Name: Tina Horton MRN: [id number] Date of Birth: Sep 07, 1965

## 2020-08-09 NOTE — Patient Instructions (Signed)
Access Code: [email or web address] URL: [email or web address] Date: 08/09/2020 Prepared by: Hildred Alamin Khalil Belote  Exercises Staggered Sit-to-Stand - 1 x daily - 5 x weekly - 2 sets - 10 reps Romberg Stance on Foam Pad - 1 x daily - 5 x weekly - 3 sets - 30sec hold Standing March with Counter Support - 1 x daily - 5 x weekly - 2 sets - 10 reps

## 2020-08-11 ENCOUNTER — Ambulatory Visit: Payer: Medicare HMO

## 2020-08-11 ENCOUNTER — Other Ambulatory Visit: Payer: Self-pay

## 2020-08-11 VITALS — BP 124/86

## 2020-08-11 DIAGNOSIS — R2681 Unsteadiness on feet: Secondary | ICD-10-CM | POA: Diagnosis not present

## 2020-08-11 DIAGNOSIS — R278 Other lack of coordination: Secondary | ICD-10-CM | POA: Diagnosis not present

## 2020-08-11 DIAGNOSIS — R2689 Other abnormalities of gait and mobility: Secondary | ICD-10-CM

## 2020-08-11 DIAGNOSIS — H5410 Blindness, one eye, low vision other eye, unspecified eyes: Secondary | ICD-10-CM | POA: Diagnosis not present

## 2020-08-11 DIAGNOSIS — M6281 Muscle weakness (generalized): Secondary | ICD-10-CM | POA: Diagnosis not present

## 2020-08-11 DIAGNOSIS — I69354 Hemiplegia and hemiparesis following cerebral infarction affecting left non-dominant side: Secondary | ICD-10-CM | POA: Diagnosis not present

## 2020-08-11 NOTE — Therapy (Signed)
[facility] [address] [place], [place], [address] Phone: [phone]   Fax:  [phone]  Physical Therapy Treatment  Patient Details  Name: Tina Horton MRN: [id number] Date of Birth: 12/28/1965 Referring Provider (PT): Referred by hospitalist; will send certification to PCP: Carlena Hurl, PA-C   Encounter Date: 08/11/2020   PT End of Session - 08/11/20 0933    Visit Number 3    Number of Visits 9    Date for PT Re-Evaluation 08/31/20    Authorization Type [employer]    PT Start Time 0931    PT Stop Time 1015    PT Time Calculation (min) 44 min    Activity Tolerance Patient tolerated treatment well    Behavior During Therapy [facility] for tasks assessed/performed           Past Medical History:  Diagnosis Date  . Anemia   . Arthritis    knees  . Bronchitis   . Diabetes mellitus without complication [facility])    age 55yo  . GERD (gastroesophageal reflux disease)   . Glaucoma    Dr. Einar Gip  . H/O mammogram 2005  . Headache   . Heart murmur   . Hyperlipidemia   . Hypertension   . Intermittent palpitations   . Legally blind   . Myopia   . Pneumonia   . Routine gynecological examination    last pap 2005  . Seasonal allergies   . Shortness of breath dyspnea   . Sinusitis   . Thyroid nodule     Past Surgical History:  Procedure Laterality Date  . CYSTECTOMY     umbilicus  . INCISION AND DRAINAGE     abdominal superficial abscess  . LAPAROSCOPIC VAGINAL HYSTERECTOMY WITH SALPINGECTOMY Bilateral 09/13/2015   Procedure: LAPAROSCOPIC ASSISTED VAGINAL HYSTERECTOMY WITH SALPINGECTOMY, McCalls Colpoplasty;  Surgeon: Thurnell Lose, MD;  Location: [facility];  Service: Gynecology;  Laterality: Bilateral;  . LASIK     x2    Vitals:   08/11/20 0942  BP: 124/86     Subjective Assessment - 08/11/20 0933    Subjective Pt reports that she felt great after last session. She has not been able to  try the exercises yet as she needs to get her husband to get a pillow.    Patient is accompained by: Family member    Pertinent History anemia, OA in knees, DM, glaucoma, heart murmur, HLD, HTN, palpitations, legally blind, PNA, thyroid nodule, sinusitis    Limitations Walking    Currently in Pain? No/denies                             [facility] Adult PT Treatment/Exercise - 08/11/20 0935      Transfers   Transfers Sit to Stand;Stand to Sit    Sit to Stand 5: Supervision    Sit to Stand Details Verbal cues for technique    Stand to Sit 5: Supervision      Ambulation/Gait   Ambulation/Gait Yes    Ambulation/Gait Assistance 5: Supervision    Ambulation/Gait Assistance Details around in clinic with PT guiding for direction without blind cane between activities.    Assistive device None    Gait Pattern Step-through pattern;Decreased step length - right;Decreased step length - left;Decreased arm swing - right;Decreased arm swing - left    Ambulation Surface Level;Indoor      Neuro Re-ed    Neuro Re-ed Details  Sit to stand 5 x 2 from mat on airex without UE support with verbal cues to lean forward and not lock out knees. In // bars: marching walk over blue mat 8' x 4 with verbal cues to tighten glut on stance leg, tapping 4" step with RLE x 10 to increase left SLS time then alternating toe taps. Pt was on blue mat for all of these. Then performed standing with RLE on step maintaining balance with adding head turns left/right x 10. Standing on blue mat with LLE with RLE on soccerball 30 sec x 2.  Standing on rockerboard positioned ant/post x 30 sec maintaining level then rocking back and forth x 10 within LOS. Pt needed min assist a couple times as shifted too far to maintain posterior. Then turned board BP at end was 138/84                       PT Long Term Goals - 08/01/20 1728      PT LONG TERM GOAL #1   Title Pt will demonstrate ability to safely perform HEP  with family's supervision    Time 4    Period Weeks    Status New    Target Date 08/31/20      PT LONG TERM GOAL #2   Title Pt will decrease five time sit to stand to </=20 seconds with supervision without pushing back of knees against mat for stability    Baseline 45.44 sec; weight shift posterior and to the left, contact with mat on posterior portion of knee during all reps    Time 4    Period Weeks    Status New    Target Date 08/31/20      PT LONG TERM GOAL #3   Title Pt will increase gait velocity using blind cane to 0.8 m/sec to indicate decreased falls risk in the community    Baseline .37 m/sec    Time 4    Period Weeks    Status New    Target Date 08/31/20      PT LONG TERM GOAL #4   Title Pt will increase BERG balance score to >/= 41/56    Baseline 33/56    Time 4    Period Weeks    Status New    Target Date 08/31/20      PT LONG TERM GOAL #5   Title Pt will ambulate x 230' over level, indoor surfaces and around various obstacles with use of blind cane and supervision with no evidence of LOB to the L    Time 4    Period Weeks    Status New    Target Date 08/31/20                 Plan - 08/11/20 1617    Clinical Impression Statement PT continued to work on balance exercises with patient. Pt showing improving control with SLS activities but still more challenged on left. Lateral positioned rockerboard was biggest challenge with pt losing balance to left.    Personal Factors and Comorbidities Comorbidity 3+;Past/Current Experience    Comorbidities anemia, OA in knees, DM, glaucoma, heart murmur, HLD, HTN, palpitations, legally blind, PNA, thyroid nodule, sinusitis    Examination-Activity Limitations Bend;Lift;Locomotion Level;Reach Overhead;Stairs;Stand;Transfers    Examination-Participation Restrictions Cleaning;Community Activity;Meal Prep;Shop    Stability/Clinical Decision Making Evolving/Moderate complexity    Rehab Potential Good    PT Frequency 2x /  week    PT Duration  4 weeks    PT Treatment/Interventions ADLs/Self Care Home Management;DME Instruction;Gait training;Stair training;Functional mobility training;Therapeutic activities;Therapeutic exercise;Balance training;Neuromuscular re-education;Patient/family education    PT Next Visit Plan Assess Vitals.  Continue LLE strengthening, standing balance, SLS activities and balance reactions; rockerboard and stepping exercises.  Working towards gait with blind cane and supervision.    Consulted and Agree with Plan of Care Patient;Family member/caregiver    Family Member Consulted husband           Patient will benefit from skilled therapeutic intervention in order to improve the following deficits and impairments:  Decreased balance,Decreased coordination,Abnormal gait,Decreased strength,Difficulty walking,Impaired UE functional use  Visit Diagnosis: Other abnormalities of gait and mobility  Unsteadiness on feet     Problem List Patient Active Problem List   Diagnosis Date Noted  . History of recent stroke 08/07/2020  . Left-sided weakness 08/07/2020  . Screen for colon cancer 08/07/2020  . Encounter for screening mammogram for malignant neoplasm of breast 08/07/2020  . S/P hysterectomy 08/07/2020  . Nasal congestion 08/07/2020  . Snoring 08/07/2020  . CVA (cerebral vascular accident) ([place]) 07/27/2020  . Acute kidney injury superimposed on chronic kidney disease ([place]) 07/27/2020  . Palpitation 07/24/2020  . History of COVID-19 05/22/2020  . COVID-19 virus infection 02/25/2020  . Sinus pressure 02/25/2020  . Acute non-recurrent frontal sinusitis 02/25/2020  . Chronic pain of both knees 01/04/2020  . 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 CKD (chronic kidney disease) stage 2, GFR 60-89 ml/min 11/29/2019  . 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 Vitamin D deficiency 03/13/2018  . Primary osteoarthritis of both knees 01/20/2018  . DDD (degenerative disc disease), lumbar 01/20/2018  .  Localized swelling, mass or lump of neck 10/30/2017  . Screening for cancer 08/20/2017  . Left leg pain 08/20/2017  . Renal cyst 01/15/2016  . Hydronephrosis of left kidney 01/15/2016  . Spinal stenosis 01/15/2016  . History of burning pain in leg 12/04/2015  . S/P laparoscopic assisted vaginal hysterectomy (LAVH) 09/13/2015  . Encounter for health maintenance examination in adult 08/21/2015  . Hyperlipidemia 08/21/2015  . Glaucoma 08/21/2015  . Heart murmur 08/21/2015  . Thyroid nodule 08/21/2015  . Vaccine refused by patient 08/21/2015  . Noncompliance 03/21/2015  . Essential hypertension 03/21/2015  . Uncontrolled diabetes mellitus with complication, with long-term current use of insulin ([place]) 03/21/2015  . Lymphadenitis 03/21/2015  . SKIN LESION 10/19/2008  . SYSTOLIC MURMUR 88/82/8003  . VISUAL DISTURBANCE NOS 07/17/2006  . REFLUX ESOPHAGITIS 07/17/2006    Electa Sniff, PT, DPT, NCS 08/11/2020, 4:20 PM  [facility] [address] [place], [place], [address] Phone: [phone]   Fax:  [phone]  Name: Tina Horton MRN: [id number] Date of Birth: 07-17-1965

## 2020-08-14 ENCOUNTER — Other Ambulatory Visit: Payer: Self-pay

## 2020-08-14 ENCOUNTER — Encounter: Payer: Self-pay | Admitting: Physical Therapy

## 2020-08-14 ENCOUNTER — Encounter: Payer: Self-pay | Admitting: Occupational Therapy

## 2020-08-14 ENCOUNTER — Ambulatory Visit: Payer: Medicare HMO | Admitting: Occupational Therapy

## 2020-08-14 ENCOUNTER — Ambulatory Visit: Payer: Medicare HMO | Admitting: Physical Therapy

## 2020-08-14 VITALS — BP 156/84

## 2020-08-14 DIAGNOSIS — I69354 Hemiplegia and hemiparesis following cerebral infarction affecting left non-dominant side: Secondary | ICD-10-CM | POA: Diagnosis not present

## 2020-08-14 DIAGNOSIS — R2681 Unsteadiness on feet: Secondary | ICD-10-CM

## 2020-08-14 DIAGNOSIS — H5410 Blindness, one eye, low vision other eye, unspecified eyes: Secondary | ICD-10-CM | POA: Diagnosis not present

## 2020-08-14 DIAGNOSIS — R278 Other lack of coordination: Secondary | ICD-10-CM

## 2020-08-14 DIAGNOSIS — M6281 Muscle weakness (generalized): Secondary | ICD-10-CM

## 2020-08-14 DIAGNOSIS — R2689 Other abnormalities of gait and mobility: Secondary | ICD-10-CM

## 2020-08-14 DIAGNOSIS — E1165 Type 2 diabetes mellitus with hyperglycemia: Secondary | ICD-10-CM

## 2020-08-14 DIAGNOSIS — IMO0002 Reserved for concepts with insufficient information to code with codable children: Secondary | ICD-10-CM

## 2020-08-14 NOTE — Therapy (Addendum)
[facility] [address] [place], [place], [address] Phone: [phone]   Fax:  [phone]  Physical Therapy Treatment  Patient Details  Name: Tina Horton MRN: [id number] Date of Birth: 08-24-1965 Referring Provider (PT): Referred by hospitalist; will send certification to PCP: Carlena Hurl, PA-C   Encounter Date: 08/14/2020   PT End of Session - 08/14/20 1618    Visit Number 4    Number of Visits 9    Date for PT Re-Evaluation 08/31/20    Authorization Type [employer]    PT Start Time 2725    PT Stop Time 3664    PT Time Calculation (min) 44 min    Activity Tolerance Patient tolerated treatment well    Behavior During Therapy [facility] for tasks assessed/performed           Past Medical History:  Diagnosis Date  . Anemia   . Arthritis    knees  . Bronchitis   . Diabetes mellitus without complication [facility])    age 55yo  . GERD (gastroesophageal reflux disease)   . Glaucoma    Dr. Einar Gip  . H/O mammogram 2005  . Headache   . Heart murmur   . Hyperlipidemia   . Hypertension   . Intermittent palpitations   . Legally blind   . Myopia   . Pneumonia   . Routine gynecological examination    last pap 2005  . Seasonal allergies   . Shortness of breath dyspnea   . Sinusitis   . Thyroid nodule     Past Surgical History:  Procedure Laterality Date  . CYSTECTOMY     umbilicus  . INCISION AND DRAINAGE     abdominal superficial abscess  . LAPAROSCOPIC VAGINAL HYSTERECTOMY WITH SALPINGECTOMY Bilateral 09/13/2015   Procedure: LAPAROSCOPIC ASSISTED VAGINAL HYSTERECTOMY WITH SALPINGECTOMY, McCalls Colpoplasty;  Surgeon: Thurnell Lose, MD;  Location: [facility];  Service: Gynecology;  Laterality: Bilateral;  . LASIK     x2    Vitals:   08/14/20 1532 08/14/20 1614  BP: (!) 148/84 (!) 156/84     Subjective Assessment - 08/14/20 1530    Subjective Pt reports she is doing well.  She has not  began her home exerices yet.  She says she was able to go to the kitchen and prepare her own food today which previously she was unable to do.  She asked about doing stairs since her daughter has a flight of stairs about 15 steps.    Patient is accompained by: Family member    Pertinent History anemia, OA in knees, DM, glaucoma, heart murmur, HLD, HTN, palpitations, legally blind, PNA, thyroid nodule, sinusitis    Limitations Walking    Currently in Pain? No/denies                             [facility] Adult PT Treatment/Exercise - 08/14/20 1540      Transfers   Sit to Stand 5: Supervision;Without upper extremity assist   x10 solid surface, 2x10 on pillow   Sit to Stand Details Verbal cues for technique    Sit to Stand Details (indicate cue type and reason) Pt able to self correct proper weightshifting and remembered to not lock knees out at top      Ambulation/Gait   Ambulation/Gait Yes    Ambulation/Gait Assistance 5: Supervision    Ambulation/Gait Assistance Details cueing when to turn due to visual deficits  Ambulation Distance (Feet) 230 Feet    Assistive device None    Gait Pattern Step-through pattern;Decreased stance time - left;Decreased step length - left;Lateral trunk lean to left    Ambulation Surface Level;Indoor               Balance Exercises - 08/14/20 0001      Balance Exercises: Standing   Standing Eyes Opened Narrow base of support (BOS);Foam/compliant surface;30 secs   x2 with perturbations   Standing Eyes Opened Limitations lateral trunk lean and rotation to L.  Verbal and tactile cues given to reorient to midline    Stepping Strategy Anterior;Lateral   2x10 tapping green dot each leg; verbal ands tactile cueing to prevent L trunk lean and activate glute during stance on L; x10 lateral taps and 2x10 lateral stepping onto green dot and back; cueing to push with stepping leg and shift all weight forward   Rockerboard  Anterior/posterior;Lateral;Intermittent UE support;Limitations   x60 sec maintaining balance, 2x60sec weightshifting each direction   Rockerboard Limitations significant lean to L even in quiet standing; verbal and tactile cues needed to reorient to midline.  Pt unable to identify when L lean occurs.  Initiates movement with head instead of hips, verbal and tactile cues required to fix             PT Education - 08/14/20 1617    Education Details Proper weightshifting during walking and trying to take bigger steps, educating about increasing activity at home    Person(s) Educated Patient    Methods Explanation    Comprehension Verbalized understanding               PT Long Term Goals - 08/01/20 1728      PT LONG TERM GOAL #1   Title Pt will demonstrate ability to safely perform HEP with family's supervision    Time 4    Period Weeks    Status New    Target Date 08/31/20      PT LONG TERM GOAL #2   Title Pt will decrease five time sit to stand to </=20 seconds with supervision without pushing back of knees against mat for stability    Baseline 45.44 sec; weight shift posterior and to the left, contact with mat on posterior portion of knee during all reps    Time 4    Period Weeks    Status New    Target Date 08/31/20      PT LONG TERM GOAL #3   Title Pt will increase gait velocity using blind cane to 0.8 m/sec to indicate decreased falls risk in the community    Baseline .37 m/sec    Time 4    Period Weeks    Status New    Target Date 08/31/20      PT LONG TERM GOAL #4   Title Pt will increase BERG balance score to >/= 41/56    Baseline 33/56    Time 4    Period Weeks    Status New    Target Date 08/31/20      PT LONG TERM GOAL #5   Title Pt will ambulate x 230' over level, indoor surfaces and around various obstacles with use of blind cane and supervision with no evidence of LOB to the L    Time 4    Period Weeks    Status New    Target Date 08/31/20  Plan - 08/14/20 1626    Clinical Impression Statement Continued progression of dynamic balance and neuromuscular redecuation exercises.  Verbal and tactile cues are still required to address lateral trunk lean to L and encourage proper ankle and hip strategies.  Pt displays improvment in gait with increased stance time on L and less lateral trunk lean, although it is still noted following end of session.    Personal Factors and Comorbidities Comorbidity 3+;Past/Current Experience    Comorbidities anemia, OA in knees, DM, glaucoma, heart murmur, HLD, HTN, palpitations, legally blind, PNA, thyroid nodule, sinusitis    Examination-Activity Limitations Bend;Lift;Locomotion Level;Reach Overhead;Stairs;Stand;Transfers    Examination-Participation Restrictions Cleaning;Community Activity;Meal Prep;Shop    Stability/Clinical Decision Making Evolving/Moderate complexity    Rehab Potential Good    PT Frequency 2x / week    PT Duration 4 weeks    PT Treatment/Interventions ADLs/Self Care Home Management;DME Instruction;Gait training;Stair training;Functional mobility training;Therapeutic activities;Therapeutic exercise;Balance training;Neuromuscular re-education;Patient/family education    PT Next Visit Plan Assess Vitals.  Stairs to assess function and endurance (4x).  Continue LLE strengthening, standing balance, SLS activities and balance reactions; rockerboard, perturbations and stepping exercises.  Working towards gait with blind cane and supervision.    Consulted and Agree with Plan of Care Patient;Family member/caregiver    Family Member Consulted husband           Patient will benefit from skilled therapeutic intervention in order to improve the following deficits and impairments:  Decreased balance,Decreased coordination,Abnormal gait,Decreased strength,Difficulty walking,Impaired UE functional use  Visit Diagnosis: Other abnormalities of gait and mobility  Unsteadiness on  feet  Hemiplegia and hemiparesis following cerebral infarction affecting left non-dominant side (HCC)  Muscle weakness (generalized)     Problem List Patient Active Problem List   Diagnosis Date Noted  . History of recent stroke 08/07/2020  . Left-sided weakness 08/07/2020  . Screen for colon cancer 08/07/2020  . Encounter for screening mammogram for malignant neoplasm of breast 08/07/2020  . S/P hysterectomy 08/07/2020  . Nasal congestion 08/07/2020  . Snoring 08/07/2020  . CVA (cerebral vascular accident) ([place]) 07/27/2020  . Acute kidney injury superimposed on chronic kidney disease ([place]) 07/27/2020  . Palpitation 07/24/2020  . History of COVID-19 05/22/2020  . COVID-19 virus infection 02/25/2020  . Sinus pressure 02/25/2020  . Acute non-recurrent frontal sinusitis 02/25/2020  . Chronic pain of both knees 01/04/2020  . 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 CKD (chronic kidney disease) stage 2, GFR 60-89 ml/min 11/29/2019  . 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 Vitamin D deficiency 03/13/2018  . Primary osteoarthritis of both knees 01/20/2018  . DDD (degenerative disc disease), lumbar 01/20/2018  . Localized swelling, mass or lump of neck 10/30/2017  . Screening for cancer 08/20/2017  . Left leg pain 08/20/2017  . Renal cyst 01/15/2016  . Hydronephrosis of left kidney 01/15/2016  . Spinal stenosis 01/15/2016  . History of burning pain in leg 12/04/2015  . S/P laparoscopic assisted vaginal hysterectomy (LAVH) 09/13/2015  . Encounter for health maintenance examination in adult 08/21/2015  . Hyperlipidemia 08/21/2015  . Glaucoma 08/21/2015  . Heart murmur 08/21/2015  . Thyroid nodule 08/21/2015  . Vaccine refused by patient 08/21/2015  . Noncompliance 03/21/2015  . Essential hypertension 03/21/2015  . Uncontrolled diabetes mellitus with complication, with long-term current use of insulin ([place]) 03/21/2015  . Lymphadenitis 03/21/2015  . SKIN LESION 10/19/2008  . SYSTOLIC MURMUR  16/02/9603  . VISUAL DISTURBANCE NOS 07/17/2006  . REFLUX ESOPHAGITIS 07/17/2006    Yetta Numbers, SPT 08/14/2020, 4:31  PM  [facility] [address] [place], [place], [address] Phone: [phone]   Fax:  [phone]  Name: Cait Locust MRN: [id number] Date of Birth: 06-30-1965

## 2020-08-14 NOTE — Therapy (Signed)
[facility] [address] [place], [place], [address] Phone: [phone]   Fax:  [phone]  Occupational Therapy Evaluation  Patient Details  Name: Tina Horton MRN: [id number] Date of Birth: November 06, 1965 No data recorded  Encounter Date: 08/14/2020   OT End of Session - 08/14/20 1624    Visit Number 1    Number of Visits 9    Date for OT Re-Evaluation 09/11/20    Authorization Type [employer] Medicare 2022    Authorization Time Period $20 per discipline    OT Start Time 1445    OT Stop Time 1521    OT Time Calculation (min) 36 min    Activity Tolerance Patient tolerated treatment well    Behavior During Therapy [facility] for tasks assessed/performed           Past Medical History:  Diagnosis Date  . Anemia   . Arthritis    knees  . Bronchitis   . Diabetes mellitus without complication [facility])    age 55yo  . GERD (gastroesophageal reflux disease)   . Glaucoma    Dr. Einar Gip  . H/O mammogram 2005  . Headache   . Heart murmur   . Hyperlipidemia   . Hypertension   . Intermittent palpitations   . Legally blind   . Myopia   . Pneumonia   . Routine gynecological examination    last pap 2005  . Seasonal allergies   . Shortness of breath dyspnea   . Sinusitis   . Thyroid nodule     Past Surgical History:  Procedure Laterality Date  . CYSTECTOMY     umbilicus  . INCISION AND DRAINAGE     abdominal superficial abscess  . LAPAROSCOPIC VAGINAL HYSTERECTOMY WITH SALPINGECTOMY Bilateral 09/13/2015   Procedure: LAPAROSCOPIC ASSISTED VAGINAL HYSTERECTOMY WITH SALPINGECTOMY, McCalls Colpoplasty;  Surgeon: Thurnell Lose, MD;  Location: [facility];  Service: Gynecology;  Laterality: Bilateral;  . LASIK     x2    There were no vitals filed for this visit.   Subjective Assessment - 08/14/20 1547    Subjective  Pt denies any pain today. Pt presents to Neuro OPOT s/p CVA March 2022. Pt's primary concern for  referral is LUE weakness and decreased coordination. Pt is also legally blind and right hand dominant. Pt was accompanied by her son.    Patient is accompanied by: Family member   son   Pertinent History PMH: CVA, Anemia, OA in knees, DM, glaucoma, heart murmur, HLD, HTN, palpitations, legally blind, PNA, thyroid nodule, sinusitis    Limitations Fall Risk. Legally Blind.    Patient Stated Goals "get movement and stuff back to normal in my arm"    Currently in Pain? No/denies             [facility] OT Assessment - 08/14/20 1449      Assessment   Medical Diagnosis R basal ganglia CVA    Onset Date/Surgical Date 07/25/20    Hand Dominance Right      Precautions   Precautions Fall    Precaution Comments heart monitor (temporary), legally blind      Balance Screen   Has the patient fallen in the past 6 months No      Home  Environment   Family/patient expects to be discharged to: Private residence    Living Arrangements Spouse/significant other   and daughter   Available Help at Discharge Family    Type of [place]  Access Stairs    Home Layout Able to live on main level with bedroom/bathroom   one story   Bathroom Shower/Tub Tub/Shower unit    Shower/tub characteristics Museum/gallery conservator;Wheelchair - manual;Bedside commode      Prior Function   Level of Independence Independent with basic ADLs;Independent with household mobility without device    Vocation On disability    Leisure shop and travel      ADL   Eating/Feeding Needs assist with cutting food   d/t vision   Grooming Modified independent   little challenging with LUE   Upper Body Bathing Minimal assistance    Lower Body Bathing Minimal assistance    Upper Body Dressing Independent    Lower Body Dressing Modified independent   litle trouble tying shoes   Toilet Transfer Modified independent    Toileting - Clothing Manipulation Modified independent    [place] Transfer Supervision/safety      IADL   Shopping Completely unable to shop    Light Housekeeping Does not participate in any housekeeping tasks    Meal Prep Needs to have meals prepared and served    Education officer, environmental on family or friends for transportation    Medication Management Is responsible for taking medication in correct dosages at correct time   pt reports doing independently   Financial Management Dependent      Written Expression   Dominant Hand Right      Vision - History   Baseline Vision Legally blind   wears glasses   Visual History Glaucoma   + severe myopia     Observation/Other Assessments   Focus on Therapeutic Outcomes (FOTO)  Not performed d/t visual deficits      Sensation   Light Touch Appears Intact      Coordination   9 Hole Peg Test --   not completed d/t vision   Box and Blocks RUE 44 LUE 32      ROM / Strength   AROM / PROM / Strength Strength;AROM      AROM   Overall AROM  Within functional limits for tasks performed      Strength   Overall Strength Within functional limits for tasks performed    Overall Strength Comments Grossly 4+/5 with LUE      Hand Function   Right Hand Gross Grasp Functional    Right Hand Grip (lbs) 49.1    Left Hand Gross Grasp Functional    Left Hand Grip (lbs) 19.6                                OT Long Term Goals - 08/14/20 1541      OT LONG TERM GOAL #1   Title Pt will be independent with HEP for proximal and grip strengthening for LUE 09/11/2020    Time 4    Period Weeks    Status New    Target Date 09/11/20      OT LONG TERM GOAL #2   Title Pt will perform shoe tying with mod I    Time 4    Period Weeks    Status New      OT LONG TERM GOAL #3   Title Pt will verbalize understanding of adapted strategies and equipment PRN for increasing safety and independence with  ADLs and IADLs (tying shoes, fixing hair, fixing snacks,  etc)    Time 4    Period Weeks    Status New      OT LONG TERM GOAL #4   Title Pt will increase grip strength to 24 lbs or greater in LUE for increasing functional use and increasing ability to perform clothing management, etc    Baseline RUE 49.1 LUE 19.6    Time 4    Period Weeks    Status New      OT LONG TERM GOAL #5   Title Pt will report increased ease and independence with obtaining light snack and/or beverage from kitchen with mod I.    Time 4    Period Weeks    Status New                 Plan - 08/14/20 1510    Clinical Impression Statement Pt is a 56 year old female referred to Neuro OPOT for evaluation of L 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  Pt's PMH is significant for the following: anemia, OA in knees, DM, glaucoma, heart murmur, HLD, HTN, palpitations, legally blind, PNA, thyroid nodule, sinusitis. Pt presents with LUE weakness and decrease in coordination resulting in decrease in independence with ADLs and IADLs. Skilled occupational therapy is recommended to target listed areas of deficit and increase independence and decrease caregiver burden.    OT Occupational Profile and History Problem Focused Assessment - Including review of records relating to presenting problem    Occupational performance deficits (Please refer to evaluation for details): IADL's;ADL's    Body Structure / Function / Physical Skills ADL;Decreased knowledge of use of DME;Coordination;IADL;Strength;GMC;Vision;Dexterity;FMC;Mobility;ROM;UE functional use;Flexibility    Rehab Potential Good    Clinical Decision Making Limited treatment options, no task modification necessary    Comorbidities Affecting Occupational Performance: None    Modification or Assistance to Complete Evaluation  No modification of tasks or assist necessary to complete eval    OT Frequency 2x / week    OT Duration 4 weeks   8 visits   OT Treatment/Interventions Self-care/ADL training;Neuromuscular  education;Therapeutic exercise;DME and/or AE instruction;Therapeutic activities;Functional Mobility Training;Patient/family education;Visual/perceptual remediation/compensation    Plan Pt is legally blind. Theraputty - grip strengthening    Consulted and Agree with Plan of Care Patient;Family member/caregiver    Family Member Consulted patient's son           Patient will benefit from skilled therapeutic intervention in order to improve the following deficits and impairments:   Body Structure / Function / Physical Skills: ADL,Decreased knowledge of use of DME,Coordination,IADL,Strength,GMC,Vision,Dexterity,FMC,Mobility,ROM,UE functional use,Flexibility       Visit Diagnosis: Muscle weakness (generalized) - Plan: Ot plan of care cert/re-cert  Other lack of coordination - Plan: Ot plan of care cert/re-cert  Unsteadiness on feet - Plan: Ot plan of care cert/re-cert  Blindness, one eye, low vision other eye, unspecified eyes - Plan: Ot plan of care cert/re-cert  Hemiplegia and hemiparesis following cerebral infarction affecting left non-dominant side ([place]) - Plan: Ot plan of care cert/re-cert    Problem List Patient Active Problem List   Diagnosis Date Noted  . History of recent stroke 08/07/2020  . Left-sided weakness 08/07/2020  . Screen for colon cancer 08/07/2020  . Encounter for screening mammogram for malignant neoplasm of breast 08/07/2020  . S/P hysterectomy 08/07/2020  . Nasal congestion 08/07/2020  . Snoring 08/07/2020  . CVA (cerebral vascular accident) ([place]) 07/27/2020  . Acute kidney injury superimposed on  chronic kidney disease ([place]) 07/27/2020  . Palpitation 07/24/2020  . History of COVID-19 05/22/2020  . COVID-19 virus infection 02/25/2020  . Sinus pressure 02/25/2020  . Acute non-recurrent frontal sinusitis 02/25/2020  . Chronic pain of both knees 01/04/2020  . 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 CKD (chronic kidney disease) stage 2, GFR 60-89 ml/min 11/29/2019   . 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 Vitamin D deficiency 03/13/2018  . Primary osteoarthritis of both knees 01/20/2018  . DDD (degenerative disc disease), lumbar 01/20/2018  . Localized swelling, mass or lump of neck 10/30/2017  . Screening for cancer 08/20/2017  . Left leg pain 08/20/2017  . Renal cyst 01/15/2016  . Hydronephrosis of left kidney 01/15/2016  . Spinal stenosis 01/15/2016  . History of burning pain in leg 12/04/2015  . S/P laparoscopic assisted vaginal hysterectomy (LAVH) 09/13/2015  . Encounter for health maintenance examination in adult 08/21/2015  . Hyperlipidemia 08/21/2015  . Glaucoma 08/21/2015  . Heart murmur 08/21/2015  . Thyroid nodule 08/21/2015  . Vaccine refused by patient 08/21/2015  . Noncompliance 03/21/2015  . Essential hypertension 03/21/2015  . Uncontrolled diabetes mellitus with complication, with long-term current use of insulin ([place]) 03/21/2015  . Lymphadenitis 03/21/2015  . SKIN LESION 10/19/2008  . SYSTOLIC MURMUR 72/89/7915  . VISUAL DISTURBANCE NOS 07/17/2006  . REFLUX ESOPHAGITIS 07/17/2006    Zachery Conch MOT, OTR/L  08/14/2020, 4:31 PM  [facility] [address] [place], [place], [address] Phone: [phone]   Fax:  [phone]  Name: Tina Horton MRN: [id number] Date of Birth: Dec 03, 1965

## 2020-08-17 ENCOUNTER — Other Ambulatory Visit: Payer: Self-pay | Admitting: Medical

## 2020-08-18 ENCOUNTER — Encounter: Payer: Self-pay | Admitting: Physical Therapy

## 2020-08-18 ENCOUNTER — Ambulatory Visit: Payer: Medicare HMO | Admitting: Occupational Therapy

## 2020-08-18 ENCOUNTER — Other Ambulatory Visit: Payer: Self-pay

## 2020-08-18 ENCOUNTER — Ambulatory Visit: Payer: Medicare HMO | Attending: Internal Medicine | Admitting: Physical Therapy

## 2020-08-18 ENCOUNTER — Encounter: Payer: Self-pay | Admitting: Occupational Therapy

## 2020-08-18 VITALS — BP 162/84 | HR 66

## 2020-08-18 DIAGNOSIS — R278 Other lack of coordination: Secondary | ICD-10-CM

## 2020-08-18 DIAGNOSIS — R2689 Other abnormalities of gait and mobility: Secondary | ICD-10-CM | POA: Insufficient documentation

## 2020-08-18 DIAGNOSIS — R2681 Unsteadiness on feet: Secondary | ICD-10-CM

## 2020-08-18 DIAGNOSIS — H5410 Blindness, one eye, low vision other eye, unspecified eyes: Secondary | ICD-10-CM | POA: Insufficient documentation

## 2020-08-18 DIAGNOSIS — I69354 Hemiplegia and hemiparesis following cerebral infarction affecting left non-dominant side: Secondary | ICD-10-CM

## 2020-08-18 DIAGNOSIS — M6281 Muscle weakness (generalized): Secondary | ICD-10-CM

## 2020-08-18 NOTE — Therapy (Signed)
[facility] [address] [place], [place], [address] Phone: [phone]   Fax:  [phone]  Occupational Therapy Treatment  Patient Details  Name: Tina Horton MRN: [id number] Date of Birth: Jun 04, 1965 No data recorded  Encounter Date: 08/18/2020   OT End of Session - 08/18/20 1106    Visit Number 2    Number of Visits 9    Date for OT Re-Evaluation 09/11/20    Authorization Type [employer] 2022    Authorization Time Period $20 per discipline    OT Start Time 1105    OT Stop Time 1143    OT Time Calculation (min) 38 min    Activity Tolerance Patient tolerated treatment well    Behavior During Therapy [facility] for tasks assessed/performed           Past Medical History:  Diagnosis Date  . Anemia   . Arthritis    knees  . Bronchitis   . Diabetes mellitus without complication [facility])    age 55yo  . GERD (gastroesophageal reflux disease)   . Glaucoma    Dr. Einar Gip  . H/O mammogram 2005  . Headache   . Heart murmur   . Hyperlipidemia   . Hypertension   . Intermittent palpitations   . Legally blind   . Myopia   . Pneumonia   . Routine gynecological examination    last pap 2005  . Seasonal allergies   . Shortness of breath dyspnea   . Sinusitis   . Thyroid nodule     Past Surgical History:  Procedure Laterality Date  . CYSTECTOMY     umbilicus  . INCISION AND DRAINAGE     abdominal superficial abscess  . LAPAROSCOPIC VAGINAL HYSTERECTOMY WITH SALPINGECTOMY Bilateral 09/13/2015   Procedure: LAPAROSCOPIC ASSISTED VAGINAL HYSTERECTOMY WITH SALPINGECTOMY, McCalls Colpoplasty;  Surgeon: Thurnell Lose, MD;  Location: [facility];  Service: Gynecology;  Laterality: Bilateral;  . LASIK     x2    There were no vitals filed for this visit.   Subjective Assessment - 08/18/20 1106    Subjective  Pt denies any pain except arthritis in the B knees. Pt reports  L shoulder stiffness.    Patient is  accompanied by: Family member   son   Pertinent History PMH: CVA, Anemia, OA in knees, DM, glaucoma, heart murmur, HLD, HTN, palpitations, legally blind, PNA, thyroid nodule, sinusitis    Limitations Fall Risk. Legally Blind.    Patient Stated Goals "get movement and stuff back to normal in my arm"    Currently in Pain? Yes    Pain Score 2     Pain Location Shoulder    Pain Orientation Left    Pain Descriptors / Indicators Tightness    Pain Type Acute pain    Pain Onset In the past 7 days    Pain Frequency Occasional               TREATMENT:  Theraputty issued  Hand gripper with LUE with level 2 with gold spring with picking up 1 inch blocks  Table Slides x 15 forward reach  Nuts and Bolts working on decreased vision and using tactile cues and coordination   Sensory Desensitization with myofascial release ball on LUE hand.           OT Education - 08/18/20 1109    Education Details Issued yellow theraputty - see pt instructions    Person(s) Educated Spouse;Patient    Methods Explanation  Comprehension Verbalized understanding               OT Long Term Goals - 08/18/20 1134      OT LONG TERM GOAL #1   Title Pt will be independent with HEP for proximal and grip strengthening for LUE 09/11/2020    Time 4    Period Weeks    Status On-going      OT LONG TERM GOAL #2   Title Pt will perform shoe tying with mod I    Time 4    Period Weeks    Status New      OT LONG TERM GOAL #3   Title Pt will verbalize understanding of adapted strategies and equipment PRN for increasing safety and independence with ADLs and IADLs (tying shoes, fixing hair, fixing snacks, etc)    Time 4    Period Weeks    Status New      OT LONG TERM GOAL #4   Title Pt will increase grip strength to 24 lbs or greater in LUE for increasing functional use and increasing ability to perform clothing management, etc    Baseline RUE 49.1 LUE 19.6    Time 4    Period Weeks    Status  On-going      OT LONG TERM GOAL #5   Title Pt will report increased ease and independence with obtaining light snack and/or beverage from kitchen with mod I.    Time 4    Period Weeks    Status On-going                 Plan - 08/18/20 1109    Clinical Impression Statement pt progressing towards goals.    OT Occupational Profile and History Problem Focused Assessment - Including review of records relating to presenting problem    Occupational performance deficits (Please refer to evaluation for details): IADL's;ADL's    Body Structure / Function / Physical Skills ADL;Decreased knowledge of use of DME;Coordination;IADL;Strength;GMC;Vision;Dexterity;FMC;Mobility;ROM;UE functional use;Flexibility    Rehab Potential Good    Clinical Decision Making Limited treatment options, no task modification necessary    Comorbidities Affecting Occupational Performance: None    Modification or Assistance to Complete Evaluation  No modification of tasks or assist necessary to complete eval    OT Frequency 2x / week    OT Duration 4 weeks   8 visits   OT Treatment/Interventions Self-care/ADL training;Neuromuscular education;Therapeutic exercise;DME and/or AE instruction;Therapeutic activities;Functional Mobility Training;Patient/family education;Visual/perceptual remediation/compensation    Plan Pt is legally blind. LUE shoulder exercises    Consulted and Agree with Plan of Care Patient;Family member/caregiver    Family Member Consulted patient's spouse           Patient will benefit from skilled therapeutic intervention in order to improve the following deficits and impairments:   Body Structure / Function / Physical Skills: ADL,Decreased knowledge of use of DME,Coordination,IADL,Strength,GMC,Vision,Dexterity,FMC,Mobility,ROM,UE functional use,Flexibility       Visit Diagnosis: Muscle weakness (generalized)  Other lack of coordination  Unsteadiness on feet  Hemiplegia and hemiparesis  following cerebral infarction affecting left non-dominant side [facility])    Problem List Patient Active Problem List   Diagnosis Date Noted  . History of recent stroke 08/07/2020  . Left-sided weakness 08/07/2020  . Screen for colon cancer 08/07/2020  . Encounter for screening mammogram for malignant neoplasm of breast 08/07/2020  . S/P hysterectomy 08/07/2020  . Nasal congestion 08/07/2020  . Snoring 08/07/2020  . CVA (cerebral vascular accident) ([place]) 07/27/2020  .  Acute kidney injury superimposed on chronic kidney disease ([place]) 07/27/2020  . Palpitation 07/24/2020  . History of COVID-19 05/22/2020  . COVID-19 virus infection 02/25/2020  . Sinus pressure 02/25/2020  . Acute non-recurrent frontal sinusitis 02/25/2020  . Chronic pain of both knees 01/04/2020  . 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 CKD (chronic kidney disease) stage 2, GFR 60-89 ml/min 11/29/2019  . 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 Vitamin D deficiency 03/13/2018  . Primary osteoarthritis of both knees 01/20/2018  . DDD (degenerative disc disease), lumbar 01/20/2018  . Localized swelling, mass or lump of neck 10/30/2017  . Screening for cancer 08/20/2017  . Left leg pain 08/20/2017  . Renal cyst 01/15/2016  . Hydronephrosis of left kidney 01/15/2016  . Spinal stenosis 01/15/2016  . History of burning pain in leg 12/04/2015  . S/P laparoscopic assisted vaginal hysterectomy (LAVH) 09/13/2015  . Encounter for health maintenance examination in adult 08/21/2015  . Hyperlipidemia 08/21/2015  . Glaucoma 08/21/2015  . Heart murmur 08/21/2015  . Thyroid nodule 08/21/2015  . Vaccine refused by patient 08/21/2015  . Noncompliance 03/21/2015  . Essential hypertension 03/21/2015  . Uncontrolled diabetes mellitus with complication, with long-term current use of insulin ([place]) 03/21/2015  . Lymphadenitis 03/21/2015  . SKIN LESION 10/19/2008  . SYSTOLIC MURMUR 77/93/9030  . VISUAL DISTURBANCE NOS 07/17/2006  . REFLUX  ESOPHAGITIS 07/17/2006    Zachery Conch MOT, OTR/L  08/18/2020, 12:03 PM  [facility] [address] [place], [place], [address] Phone: [phone]   Fax:  [phone]  Name: Tina Horton MRN: [id number] Date of Birth: [birth year]

## 2020-08-18 NOTE — Therapy (Signed)
[facility] [address] [place], [place], [address] Phone: [phone]   Fax:  [phone]  Physical Therapy Treatment  Patient Details  Name: Tina Horton MRN: [id number] Date of Birth: Apr 13, 1966 Referring Provider (PT): Referred by hospitalist; will send certification to PCP: Carlena Hurl, PA-C   Encounter Date: 08/18/2020   PT End of Session - 08/18/20 1112    Visit Number 5    Number of Visits 9    Date for PT Re-Evaluation 08/31/20    Authorization Type [employer] Medicare    PT Start Time 1025    PT Stop Time 1103    PT Time Calculation (min) 38 min    Activity Tolerance Patient tolerated treatment well    Behavior During Therapy [facility] for tasks assessed/performed           Past Medical History:  Diagnosis Date  . Anemia   . Arthritis    knees  . Bronchitis   . Diabetes mellitus without complication [facility])    age 55yo  . GERD (gastroesophageal reflux disease)   . Glaucoma    Dr. Einar Gip  . H/O mammogram 2005  . Headache   . Heart murmur   . Hyperlipidemia   . Hypertension   . Intermittent palpitations   . Legally blind   . Myopia   . Pneumonia   . Routine gynecological examination    last pap 2005  . Seasonal allergies   . Shortness of breath dyspnea   . Sinusitis   . Thyroid nodule     Past Surgical History:  Procedure Laterality Date  . CYSTECTOMY     umbilicus  . INCISION AND DRAINAGE     abdominal superficial abscess  . LAPAROSCOPIC VAGINAL HYSTERECTOMY WITH SALPINGECTOMY Bilateral 09/13/2015   Procedure: LAPAROSCOPIC ASSISTED VAGINAL HYSTERECTOMY WITH SALPINGECTOMY, McCalls Colpoplasty;  Surgeon: Thurnell Lose, MD;  Location: [facility];  Service: Gynecology;  Laterality: Bilateral;  . LASIK     x2    Vitals:   08/18/20 1032 08/18/20 1101  BP: (!) 148/76 (!) 162/84  Pulse:  66     Subjective Assessment - 08/18/20 1027    Subjective Pt reports she is doing very well  and has been walking more.  She came to clinic with her blind cane and says her family members do not have to hold onto her to walk.  She had a BP spike while in the waiting room but she drank water and rechecked a few minutes later and it lowered back down.    Patient is accompained by: Family member    Pertinent History anemia, OA in knees, DM, glaucoma, heart murmur, HLD, HTN, palpitations, legally blind, PNA, thyroid nodule, sinusitis    Limitations Walking    Currently in Pain? No/denies                             [facility] Adult PT Treatment/Exercise - 08/18/20 1120      Ambulation/Gait   Ambulation/Gait Yes    Ambulation/Gait Assistance 5: Supervision    Ambulation/Gait Assistance Details walking with blind cane, and verbal cueing from therapist for obstacles and directions    Ambulation Distance (Feet) 230 Feet    Gait Pattern Step-through pattern;Decreased arm swing - left;Decreased stance time - left;Lateral trunk lean to left    Ambulation Surface Level;Indoor    Stairs Yes    Stairs Assistance 5: Supervision    Stairs  Assistance Details (indicate cue type and reason) Reciprocal patterning, no LOB; BP following 152/84    Stair Management Technique One rail Left   similar to stairs at her daughters house   Number of Stairs 16    Gait Comments Walking with blind cane over compliant surfaces; blue foam and red mat with solid surface between. x4 laps walking, x1 with marches.  Had to end early due to heart palpitation from heart monitor.               Balance Exercises - 08/18/20 1122      Balance Exercises: Standing   Step Ups Forward;Lateral;Intermittent UE support;Limitations   x10 forward step up 6in into high knee to increase SL stance time, x10 lateral step 4in with foot on box entire time, x8 lateral step up 4 in box each side   Step Ups Limitations Difficulty maintaining trunk alignmentduring R lateral raises due to tendency to lean to L.  Assist from  therapist at trunk.  Decreased ability to maintain proper pelvic alignment on L during lateral step ups, verbal and tactile cueing unable to correct.             PT Education - 08/18/20 1112    Education Details Walking in the grocery store instead of using her WC, okay to walk up flight of stairs to her families houses    [employer]) Educated Patient    Methods Explanation    Comprehension Verbalized understanding               PT Long Term Goals - 08/01/20 1728      PT LONG TERM GOAL #1   Title Pt will demonstrate ability to safely perform HEP with family's supervision    Time 4    Period Weeks    Status New    Target Date 08/31/20      PT LONG TERM GOAL #2   Title Pt will decrease five time sit to stand to </=20 seconds with supervision without pushing back of knees against mat for stability    Baseline 45.44 sec; weight shift posterior and to the left, contact with mat on posterior portion of knee during all reps    Time 4    Period Weeks    Status New    Target Date 08/31/20      PT LONG TERM GOAL #3   Title Pt will increase gait velocity using blind cane to 0.8 m/sec to indicate decreased falls risk in the community    Baseline .37 m/sec    Time 4    Period Weeks    Status New    Target Date 08/31/20      PT LONG TERM GOAL #4   Title Pt will increase BERG balance score to >/= 41/56    Baseline 33/56    Time 4    Period Weeks    Status New    Target Date 08/31/20      PT LONG TERM GOAL #5   Title Pt will ambulate x 230' over level, indoor surfaces and around various obstacles with use of blind cane and supervision with no evidence of LOB to the L    Time 4    Period Weeks    Status New    Target Date 08/31/20                 Plan - 08/18/20 1113    Clinical Impression Statement Assessed independent function on stairs and respond  of BP due to pts concern.  Pt displayed proper safety awareness and there was no concern for her completing this  activity at home.  Incorportated multidirectional step ups to increase single limb stance time and address strength deficits.  Practiced gait training with blind cane.  Pt still presents with mild L trunk lean.  Ended session a few minutes early due signal of heart palpitation from heart monitor.  Assessment of vitals signs were appropriate for exercise.    Personal Factors and Comorbidities Comorbidity 3+;Past/Current Experience    Comorbidities anemia, OA in knees, DM, glaucoma, heart murmur, HLD, HTN, palpitations, legally blind, PNA, thyroid nodule, sinusitis    Examination-Activity Limitations Bend;Lift;Locomotion Level;Reach Overhead;Stairs;Stand;Transfers    Examination-Participation Restrictions Cleaning;Community Activity;Meal Prep;Shop    Stability/Clinical Decision Making Evolving/Moderate complexity    Rehab Potential Good    PT Frequency 2x / week    PT Duration 4 weeks    PT Treatment/Interventions ADLs/Self Care Home Management;DME Instruction;Gait training;Stair training;Functional mobility training;Therapeutic activities;Therapeutic exercise;Balance training;Neuromuscular re-education;Patient/family education    PT Next Visit Plan Assess Vitals. Gait over compliant surfaces. Continue LLE strengthening, standing balance, SLS activities and balance reactions; rockerboard, perturbations and stepping exercises.  Working towards gait with blind cane and supervision.    Consulted and Agree with Plan of Care Patient;Family member/caregiver    Family Member Consulted husband           Patient will benefit from skilled therapeutic intervention in order to improve the following deficits and impairments:  Decreased balance,Decreased coordination,Abnormal gait,Decreased strength,Difficulty walking,Impaired UE functional use  Visit Diagnosis: Muscle weakness (generalized)  Other lack of coordination  Unsteadiness on feet  Hemiplegia and hemiparesis following cerebral infarction  affecting left non-dominant side [facility])     Problem List Patient Active Problem List   Diagnosis Date Noted  . History of recent stroke 08/07/2020  . Left-sided weakness 08/07/2020  . Screen for colon cancer 08/07/2020  . Encounter for screening mammogram for malignant neoplasm of breast 08/07/2020  . S/P hysterectomy 08/07/2020  . Nasal congestion 08/07/2020  . Snoring 08/07/2020  . CVA (cerebral vascular accident) ([place]) 07/27/2020  . Acute kidney injury superimposed on chronic kidney disease ([place]) 07/27/2020  . Palpitation 07/24/2020  . History of COVID-19 05/22/2020  . COVID-19 virus infection 02/25/2020  . Sinus pressure 02/25/2020  . Acute non-recurrent frontal sinusitis 02/25/2020  . Chronic pain of both knees 01/04/2020  . 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 CKD (chronic kidney disease) stage 2, GFR 60-89 ml/min 11/29/2019  . 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 Vitamin D deficiency 03/13/2018  . Primary osteoarthritis of both knees 01/20/2018  . DDD (degenerative disc disease), lumbar 01/20/2018  . Localized swelling, mass or lump of neck 10/30/2017  . Screening for cancer 08/20/2017  . Left leg pain 08/20/2017  . Renal cyst 01/15/2016  . Hydronephrosis of left kidney 01/15/2016  . Spinal stenosis 01/15/2016  . History of burning pain in leg 12/04/2015  . S/P laparoscopic assisted vaginal hysterectomy (LAVH) 09/13/2015  . Encounter for health maintenance examination in adult 08/21/2015  . Hyperlipidemia 08/21/2015  . Glaucoma 08/21/2015  . Heart murmur 08/21/2015  . Thyroid nodule 08/21/2015  . Vaccine refused by patient 08/21/2015  . Noncompliance 03/21/2015  . Essential hypertension 03/21/2015  . Uncontrolled diabetes mellitus with complication, with long-term current use of insulin ([place]) 03/21/2015  . Lymphadenitis 03/21/2015  . SKIN LESION 10/19/2008  . SYSTOLIC MURMUR 54/49/2010  . VISUAL DISTURBANCE NOS 07/17/2006  . REFLUX ESOPHAGITIS 07/17/2006  Yetta Numbers, SPT 08/18/2020, 11:27 AM  [facility] [address] [place], [place], [address] Phone: [phone]   Fax:  [phone]  Name: Yena Tisby MRN: [id number] Date of Birth: 1966/03/15

## 2020-08-18 NOTE — Patient Instructions (Signed)
1. Grip Strengthening (Resistive Putty)   Squeeze putty using thumb and all fingers. Repeat _20___ times. Do __2__ sessions per day.   2. Roll putty into tube on table and pinch between each finger and thumb x 10 reps each. (can do ring and small finger together)     Copyright  [employer]. All rights reserved.   

## 2020-08-21 ENCOUNTER — Ambulatory Visit (HOSPITAL_COMMUNITY): Payer: Medicare HMO | Attending: Internal Medicine

## 2020-08-21 ENCOUNTER — Other Ambulatory Visit: Payer: Self-pay | Admitting: Medical

## 2020-08-21 ENCOUNTER — Other Ambulatory Visit: Payer: Self-pay

## 2020-08-21 ENCOUNTER — Telehealth: Payer: Self-pay | Admitting: Neurology

## 2020-08-21 ENCOUNTER — Ambulatory Visit: Payer: Medicare HMO | Admitting: Physical Therapy

## 2020-08-21 DIAGNOSIS — R002 Palpitations: Secondary | ICD-10-CM | POA: Insufficient documentation

## 2020-08-21 DIAGNOSIS — I10 Essential (primary) hypertension: Secondary | ICD-10-CM | POA: Insufficient documentation

## 2020-08-21 DIAGNOSIS — R011 Cardiac murmur, unspecified: Secondary | ICD-10-CM

## 2020-08-21 DIAGNOSIS — E785 Hyperlipidemia, unspecified: Secondary | ICD-10-CM | POA: Insufficient documentation

## 2020-08-21 LAB — ECHOCARDIOGRAM COMPLETE
Area-P 1/2: 5.79 cm2
S' Lateral: 2.1 cm

## 2020-08-21 NOTE — Telephone Encounter (Signed)
Pt called an advise to follow-up with PCP to address high blood pressure, her symptoms are probably related to this.  If symptom of dizziness persist even with normal BP, then ok to keep appointment with Dr Posey Pronto. Pt verbalized understanding stated that she would call her PCP

## 2020-08-21 NOTE — Telephone Encounter (Signed)
Please advise patient to follow-up with PCP to address high blood pressure, her symptoms are probably related to this.  If symptom of dizziness persist even with normal BP, then ok to keep appointment with me.

## 2020-08-21 NOTE — Telephone Encounter (Signed)
Is this ok to refill, reply to [facility] 

## 2020-08-21 NOTE — Telephone Encounter (Signed)
Patient spoke with after hours, I scheduled a follow up visit for her on 09/08/20.

## 2020-08-22 ENCOUNTER — Ambulatory Visit: Payer: Medicare HMO | Admitting: Cardiovascular Disease

## 2020-08-22 ENCOUNTER — Telehealth: Payer: Self-pay

## 2020-08-22 ENCOUNTER — Telehealth: Payer: Self-pay | Admitting: Neurology

## 2020-08-22 ENCOUNTER — Encounter: Payer: Self-pay | Admitting: Medical

## 2020-08-22 ENCOUNTER — Ambulatory Visit (INDEPENDENT_AMBULATORY_CARE_PROVIDER_SITE_OTHER): Payer: Medicare HMO | Admitting: Medical

## 2020-08-22 VITALS — BP 190/102 | HR 80 | Ht 66.0 in | Wt 135.2 lb

## 2020-08-22 DIAGNOSIS — R002 Palpitations: Secondary | ICD-10-CM | POA: Diagnosis not present

## 2020-08-22 DIAGNOSIS — I10 Essential (primary) hypertension: Secondary | ICD-10-CM

## 2020-08-22 DIAGNOSIS — N1330 Unspecified hydronephrosis: Secondary | ICD-10-CM | POA: Diagnosis not present

## 2020-08-22 DIAGNOSIS — N182 Chronic kidney disease, stage 2 (mild): Secondary | ICD-10-CM

## 2020-08-22 DIAGNOSIS — Z794 Long term (current) use of insulin: Secondary | ICD-10-CM

## 2020-08-22 DIAGNOSIS — N189 Chronic kidney disease, unspecified: Secondary | ICD-10-CM

## 2020-08-22 DIAGNOSIS — E041 Nontoxic single thyroid nodule: Secondary | ICD-10-CM

## 2020-08-22 DIAGNOSIS — R0683 Snoring: Secondary | ICD-10-CM | POA: Diagnosis not present

## 2020-08-22 DIAGNOSIS — E118 Type 2 diabetes mellitus with unspecified complications: Secondary | ICD-10-CM

## 2020-08-22 DIAGNOSIS — E1165 Type 2 diabetes mellitus with hyperglycemia: Secondary | ICD-10-CM

## 2020-08-22 DIAGNOSIS — E1122 Type 2 diabetes mellitus with diabetic chronic kidney disease: Secondary | ICD-10-CM | POA: Diagnosis not present

## 2020-08-22 DIAGNOSIS — IMO0002 Reserved for concepts with insufficient information to code with codable children: Secondary | ICD-10-CM

## 2020-08-22 DIAGNOSIS — Z9189 Other specified personal risk factors, not elsewhere classified: Secondary | ICD-10-CM

## 2020-08-22 DIAGNOSIS — D631 Anemia in chronic kidney disease: Secondary | ICD-10-CM

## 2020-08-22 MED ORDER — CLONIDINE HCL 0.1 MG PO TABS: 0.1000 mg | ORAL_TABLET | Freq: Two times a day (BID) | ORAL | 2 refills | Status: DC

## 2020-08-22 NOTE — Telephone Encounter (Signed)
Called patient and advised her to please call her PCP in regards to her blood pressure. Patient stated that she has called her PCP and has an appt with PCP today at noon. Patient had no further questions or concerns.

## 2020-08-22 NOTE — Telephone Encounter (Signed)
Ok.  Switch from the 6.25mg  pills TO the Carvedilol 25mg  twice daily.  Continue Losartan BP medicaiton.  After 2 days, ADD Clonidine 0.1mg  twice daily for blood pressure as well.    Check blood pressures twice daily, and get me readings and come in for BP nurse visit check in 2 weeks.   Follow up with Dr. Carolin Sicks as planned in May  I will also send word to Dr. Gwenlyn Found about today's visit and recommendations

## 2020-08-22 NOTE — Telephone Encounter (Signed)
Message has been sent to patient via mychart  

## 2020-08-22 NOTE — Patient Instructions (Addendum)
Recommendations:  Increase dose of Coreg/Carvedilol to 25mg  twice daily.     Continue Losartan 100mg  daily  After 3 days on this regimen above, add Clonidine 0.1mg  twice daily  If you have any problems or side effects from Clonidine, DO NOT stop Clonidine abruptly.  Call us, your kidney doctor or heart doctor if you need to stop Clonidine.  Check your blood pressure twice daily, and bring readings to you next visits with me and your specialists.   Plan to come to our office in 2 weeks for a blood pressure check/nurse visit and bring your blood pressure readings    Continue plan to complete sleep study soon  Continue plan to see cardiology this week  Continue plan to see [facility] in May  Continue physical therapy and occupational therapy  continue plan to have 1st appointment with diabetes specialist soon regarding diabetes and thyroid nodule

## 2020-08-22 NOTE — Progress Notes (Signed)
Subjective:  Tina Horton is a 55 y.o. female who presents for Chief Complaint  Patient presents with  . Hypertension    Follow up with blood pressure from yesterday per neuro.191/109. noticed increase in blood pressure after stopping plavix.     Here for BP concerns.  Here accompanied by her husband.  She saw neurology recently, and was told to f/u here due to BPs being high.  She notes compliance with Losartan 100mg  daily.  Although the chart record shows carvedilol 25 mg twice a day, including the refill sent by Dr. Gwenlyn Found on March 9 for 25 mg twice daily, and despite the same information being listed on the discharge summary at the hospital in March, she apparently is only taking 12.5 mg twice daily.  She notes she is taking 2 tablets of the 6.25 mg twice daily to equal 12.5 mg twice daily.  Neither she nor her husband are sure whether she received the carvedilol 25 mg from [facility] yet or not.  She was hospitalized for stroke in March 2022.  Still having intermittent dizziness.   No chest pain, no edema, no palpitations.  She still is worried as to why her blood pressures are spiking.  She is afraid to do much of anything due to the blood pressure spikes.  She notes that walking from her car to the apartment can cause a spike in her blood pressure.  She canceled her physical and occupational therapy earlier this week due to blood pressure spiking.  She and her family are using a transport chair to get her from one location to another due to the elevated blood pressures  She denies headache, blurred vision, confusion, and no new numbness, tingling, weakness.  In general her head feels weird since the stroke in March  Prior medications have included lisinopril which she refused to take due to new she read about possible contaminants in the manufacturing, she did not tolerate amlodipine in the past due to swelling in the legs, and at one point she was on Maxide in the remote past.  She  does not think she has ever been on clonidine or hydralazine  She just had an echocardiogram this week, and got results through my chart yesterday that things look good.  She sees cardiology, Dr. Gwenlyn Found this Friday  She is supposed to turn in the cardiac event monitor mid April.  She is still wearing this from her palpitation concern  She is using 4 insulin units with meals currently and she notes that her morning sugars are staying about 125.  She notes any higher amount of medicine causes her sugar to drop too low.  She will see as low as 50 if she goes higher on her insulin.  She is still pending new consult with endocrinology  Still pending sleep study.  She had to hold off on this temporarily.  No other aggravating or relieving factors.    No other c/o.  The following portions of the patient's history were reviewed and updated as appropriate: allergies, current medications, past family history, past medical history, past social history, past surgical history and problem list.  ROS Otherwise as in subjective above    Objective: BP (!) 190/102   Pulse 80   Ht 5\' 6"  (1.676 m)   Wt 135 lb 3.2 oz (61.3 kg)   LMP 09/13/2015   SpO2 98%   BMI 21.82 kg/m    Wt Readings from Last 3 Encounters:  08/22/20 135 lb  3.2 oz (61.3 kg)  08/07/20 136 lb 12.8 oz (62.1 kg)  08/03/20 135 lb (61.2 kg)   BP Readings from Last 3 Encounters:  08/22/20 (!) 190/102  08/18/20 (!) 162/84  08/14/20 (!) 156/84    General appearance: alert, no distress, well developed, well nourished HEENT: normocephalic, sclerae anicteric, conjunctiva pink and moist, TMs pearly, nares patent, no discharge or erythema, pharynx normal Oral cavity: MMM, no lesions Neck: supple, no lymphadenopathy, no thyromegaly, no masses, no bruits Heart: RRR, normal S1, S2, no murmurs Lungs: CTA bilaterally, no wheezes, rhonchi, or rales Pulses: 2+ radial pulses, 2+ pedal pulses, normal cap refill Ext: no  edema     Assessment: Encounter Diagnoses  Name Primary?  . Essential hypertension Yes  . CKD (chronic kidney disease) stage 2, GFR 60-89 ml/min   . Hydronephrosis of left kidney   . Anemia of renal disease   . Uncontrolled diabetes mellitus with complication, with long-term current use of insulin ([facility])   . Snoring   . Thyroid nodule   . Difficulty agreeing with care plan   . Palpitation      Plan: Discussed her concerns.    She is concerned as to why her blood pressure remains elevated.   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  I advised she follow up with cardiology and nephrology as planned.  She reports follow up scheduled with cardiology this week.  She has follow up with nephrology 09/28/2020  Uncontrolled hypertension - I called and spoke to Dr. Carolin Sicks, [facility] today.  He was not aware of her recent 07/2020 hospitalization for stroke.  He saw her last in 05/2020.   We discussed that her current Coreg is 2 x 6.25mg  tablets or 12.5mg  BID dosing although hospital discharge and recent cardiology refill and notes show Coreg 25mg  BID dosing .  She is complaint with Losartan 100mg  daily.   After our discussion, he advised to have her change to the Coreg 25mg  BID, c/t Losartan 100mg , add Clonidine 0.1mg  BID, monitor BP BID, recheck at PCP office in 1-2 weeks with BP nurse visit check, and she is to bring all pill bottles to   Uncontrolled diabetes and noncompliance - she continues currently on meal time insulin 4u  AC.    We had made referral last year for endocrinology. She repeatedly canceled appointments, rescheduled and canceled  appointments and eventually endocrinology would not see her at [facility].  We then tried to refer to 2 other endocrinologist who would not accept her as a patient.   She is scheduled to see the only local endocrinologist that would accept her soon.     Thyroid nodule - she will discuss with endocrinology on upcoming appointment  Snoring, screen for sleep apnea - sleep study pending.  Recent stroke - no new symptoms.  Still has ongoing dizziness at times since recent stroke.   She is receiving physical and occupation therapy currently.  Palpitations - continue plan for follow up on event monitor with Dr. Gwenlyn Found    Special note: In general, we have had issues prior with her stopping medicaiton without letting a provider know, stopping a particular medication claiming a potential side effect when often the symptom had nothing to do with that particular  medication.   She has noted in the past that she felt I was trying to give her cancer when she read in the news about contaminants in the manufacturing process with Lisinopril that she heard about in the news.  For whatever reason she was convinced we/medical establishment was purposely trying to cause harm, although at the time we had no knowledge of a recall on that particular medication.    Due to noncompliance and difficulty with her agreement on care plan, I have been more removed from managing most of her medical issues and facilitating referrals to specialist.       Suzannah was seen today for hypertension.  Diagnoses and all orders for this visit:  Essential hypertension  CKD (chronic kidney disease) stage 2, GFR 60-89 ml/min  Hydronephrosis of left kidney  Anemia of renal disease  Uncontrolled diabetes mellitus with complication, with long-term current use of insulin ([facility])  Snoring  Thyroid nodule  Difficulty agreeing with care plan  Palpitation  Other orders -     cloNIDine (CATAPRES) 0.1 MG tablet; Take 1 tablet (0.1 mg  total) by mouth 2 (two) times daily.   Spent > 45 minutes face to face with patient in discussion of symptoms, evaluation, calls to other specialists offices plan and recommendations.      Follow up: pending call back

## 2020-08-22 NOTE — Telephone Encounter (Signed)
Pt called to let you know she did find the Carvedilol 25mg 

## 2020-08-23 NOTE — Progress Notes (Signed)
Patient has delayed her sleep study. This was noted by [facility]. Last note has been sent to [facility]. AVS is being mailed to patient.

## 2020-08-25 ENCOUNTER — Encounter: Payer: Self-pay | Admitting: Physical Therapy

## 2020-08-25 ENCOUNTER — Ambulatory Visit: Payer: Medicare HMO | Admitting: Occupational Therapy

## 2020-08-25 ENCOUNTER — Other Ambulatory Visit: Payer: Self-pay

## 2020-08-25 ENCOUNTER — Encounter: Payer: Self-pay | Admitting: Occupational Therapy

## 2020-08-25 ENCOUNTER — Ambulatory Visit: Payer: Medicare HMO | Admitting: Physical Therapy

## 2020-08-25 ENCOUNTER — Ambulatory Visit (INDEPENDENT_AMBULATORY_CARE_PROVIDER_SITE_OTHER): Payer: Medicare HMO | Admitting: Cardiovascular Disease

## 2020-08-25 ENCOUNTER — Encounter: Payer: Self-pay | Admitting: Cardiovascular Disease

## 2020-08-25 VITALS — BP 159/92 | HR 74

## 2020-08-25 VITALS — BP 150/66

## 2020-08-25 DIAGNOSIS — R2681 Unsteadiness on feet: Secondary | ICD-10-CM

## 2020-08-25 DIAGNOSIS — E782 Mixed hyperlipidemia: Secondary | ICD-10-CM | POA: Diagnosis not present

## 2020-08-25 DIAGNOSIS — R278 Other lack of coordination: Secondary | ICD-10-CM

## 2020-08-25 DIAGNOSIS — R2689 Other abnormalities of gait and mobility: Secondary | ICD-10-CM | POA: Diagnosis not present

## 2020-08-25 DIAGNOSIS — I10 Essential (primary) hypertension: Secondary | ICD-10-CM | POA: Diagnosis not present

## 2020-08-25 DIAGNOSIS — M6281 Muscle weakness (generalized): Secondary | ICD-10-CM | POA: Diagnosis not present

## 2020-08-25 DIAGNOSIS — I69354 Hemiplegia and hemiparesis following cerebral infarction affecting left non-dominant side: Secondary | ICD-10-CM | POA: Diagnosis not present

## 2020-08-25 DIAGNOSIS — R002 Palpitations: Secondary | ICD-10-CM | POA: Diagnosis not present

## 2020-08-25 DIAGNOSIS — H5410 Blindness, one eye, low vision other eye, unspecified eyes: Secondary | ICD-10-CM | POA: Diagnosis not present

## 2020-08-25 NOTE — Telephone Encounter (Signed)
done

## 2020-08-25 NOTE — Assessment & Plan Note (Signed)
Complaints of palpitations with recent event monitor showing only occasional PACs and PVCs but no atrial fibrillation.  She was admitted to the hospital overnight on 07/27/2020 with a stroke.  She had a right basal ganglia corona radiata infarct.  2D echo was unremarkable.  Carotid Dopplers were normal.  She is currently wearing another event monitor.  Her carvedilol was uptitrated to 25 mg p.o. twice daily.  She said no significant recurrent palpitations.

## 2020-08-25 NOTE — Progress Notes (Signed)
08/25/2020 Tina Horton   1965-07-28  [id number]  Primary Physician Tysinger, Camelia Eng, PA-C Primary Cardiologist: Lorretta Harp MD Garret Reddish, [place], [place]  HPI:  Tina Horton is a 55 y.o.  moderately overweight married African American female mother of 3who I last saw in the office  07/26/2020. She is accompanied by her husband [name] today.[name] She was referred for preoperative clearance before elective hysterectomy. She currently is out of work for the last 2 years but did work as a Chemical engineer at [employer]. Her cardiac risk factor profile is notable for treated hypertension, diabetes and hyperlipidemia. She has never had a heart attack or stroke. She denies chest pain or shortness of breath. She does have a murmur apparently.I obtain a 2D echocardiogram on 09/04/2015 which was entirely normal. Routine GXT done for preoperative clearance for a hysterectomy was normal as well.  She did have COVID-19 in fall of last year and has had difficult blood pressure management since that time.  She was admitted to the hospital the day after I saw her with a stroke.  She was having some unsteadiness of gait.  The MRI showed a right corona radiata/infarct.  2D echo was unrevealing at work as were carotid Dopplers.  She is currently wearing an event monitor for 30 days.  Her carvedilol was uptitrated to 25 mg p.o. twice daily.  She was also started on high-dose atorvastatin.  Current Meds  Medication Sig  . Alcohol Swabs (B-D SINGLE USE SWABS REGULAR) PADS USE AS DIRECTED WHEN TESTING BLOOD SUGAR  . aspirin EC 81 MG tablet Take 1 tablet (81 mg total) by mouth daily.  . Blood Glucose Monitoring Suppl (PRODIGY AUTOCODE BLOOD GLUCOSE) w/Device KIT   . carvedilol (COREG) 25 MG tablet Take 1 tablet (25 mg total) by mouth 2 (two) times daily.  . cloNIDine (CATAPRES) 0.1 MG tablet Take 1 tablet (0.1 mg total) by mouth 2 (two) times daily.  . dorzolamide-timolol (COSOPT) 22.3-6.8 MG/ML  ophthalmic solution Place 1 drop into both eyes 2 (two) times daily.  [name] glucose blood (PRODIGY NO CODING BLOOD GLUC) test strip Use as instructed  . latanoprost (XALATAN) 0.005 % ophthalmic solution Place 1 drop into both eyes at bedtime.  [name] losartan (COZAAR) 100 MG tablet Take 1 tablet (100 mg total) by mouth daily.  [name] NOVOFINE PLUS PEN NEEDLE 32G X 4 MM MISC USE 4 TIMES A DAY WITH INSULIN INJECTIONS  . NOVOLOG FLEXPEN 100 UNIT/ML FlexPen INJECT 20 UNITS SUBCUTANEOUSLY THREE TIMES DAILY WITH MEALS (USE SLIDING SCALE PROVIDED BY MD)(PEN EXPIRES AT 28 DAYS) (Patient taking differently: Inject 16 Units into the skin 3 (three) times daily with meals.)  . PRODIGY NO CODING BLOOD GLUC test strip TEST BLOOD SUGAR THREE TIMES DAILY  . Prodigy Twist Top Lancets 28G MISC USE TO TEST BLOOD SUGAR THREE TIMES DAILY  . VITAMIN D-VITAMIN K PO Take by mouth 2 (two) times a week.  . [DISCONTINUED] atorvastatin (LIPITOR) 80 MG tablet Take 1 tablet (80 mg total) by mouth daily.  . [DISCONTINUED] atorvastatin (LIPITOR) 80 MG tablet TAKE 1 TABLET (80 MG TOTAL) BY MOUTH DAILY.  . [DISCONTINUED] clopidogrel (PLAVIX) 75 MG tablet Take 1 tablet (75 mg total) by mouth daily.  . [DISCONTINUED] clopidogrel (PLAVIX) 75 MG tablet TAKE 1 TABLET (75 MG TOTAL) BY MOUTH DAILY.     Allergies  Allergen Reactions  . Amlodipine Swelling    Swelling   . Metformin And Related Diarrhea and  Nausea And Vomiting  . Tanzeum [Albiglutide] Nausea And Vomiting  . Lisinopril Other (See Comments)    Refuses, says it causes cancer    Social History   Socioeconomic History  . Marital status: Married    Spouse name: Not on file  . Number of children: Not on file  . Years of education: Not on file  . Highest education level: Not on file  Occupational History  . Not on file  Tobacco Use  . Smoking status: Never Smoker  . Smokeless tobacco: Never Used  Vaping Use  . Vaping Use: Never used  Substance and Sexual Activity  .  Alcohol use: No    Alcohol/week: 0.0 standard drinks  . Drug use: No  . Sexual activity: Not on file  Other Topics Concern  . Not on file  Social History Narrative   Married, has 3 children, not exercising, but walks at work.     Lives in a one story home.     On disability.  Education: some college.      Update: No longer working, active in the home. Still on disability   Right handed    Social Determinants of Health   Financial Resource Strain: Not on file  Food Insecurity: Not on file  Transportation Needs: Not on file  Physical Activity: Not on file  Stress: Not on file  Social Connections: Not on file  Intimate Partner Violence: Not on file     Review of Systems: General: negative for chills, fever, night sweats or weight changes.  Cardiovascular: negative for chest pain, dyspnea on exertion, edema, orthopnea, palpitations, paroxysmal nocturnal dyspnea or shortness of breath Dermatological: negative for rash Respiratory: negative for cough or wheezing Urologic: negative for hematuria Abdominal: negative for nausea, vomiting, diarrhea, bright red blood per rectum, melena, or hematemesis Neurologic: negative for visual changes, syncope, or dizziness All other systems reviewed and are otherwise negative except as noted above.    Blood pressure 130/62, pulse 75, height $RemoveBe'5\' 1"'ldkdeChpC$  (1.549 m), weight 135 lb 6.4 oz (61.4 kg), last menstrual period 09/13/2015, SpO2 96 %.  General appearance: alert and no distress Neck: no adenopathy, no carotid bruit, no JVD, supple, symmetrical, trachea midline and thyroid not enlarged, symmetric, no tenderness/mass/nodules Lungs: clear to auscultation bilaterally Heart: regular rate and rhythm, S1, S2 normal, no murmur, click, rub or gallop Extremities: extremities normal, atraumatic, no cyanosis or edema Pulses: 2+ and symmetric Skin: Skin color, texture, turgor normal. No rashes or lesions Neurologic: Alert and oriented X 3, normal strength  and tone. Normal symmetric reflexes. Normal coordination and gait  EKG not performed today  ASSESSMENT AND PLAN:   Essential hypertension History of essential hypertension worse after her episode of Covid in the fall currently on carvedilol and losartan with blood pressure measured in the office of 130/62.  Hyperlipidemia History of hyperlipidemia with lipid profile performed 07/24/2020 revealing total cholesterol of 228, LDL 165 and HDL 48.  She was recently begun on atorvastatin 80 mg a day.  We will recheck a lipid liver profile in 2 months.  Palpitation Complaints of palpitations with recent event monitor showing only occasional PACs and PVCs but no atrial fibrillation.  She was admitted to the hospital overnight on 07/27/2020 with a stroke.  She had a right basal ganglia corona radiata infarct.  2D echo was unremarkable.  Carotid Dopplers were normal.  She is currently wearing another event monitor.  Her carvedilol was uptitrated to 25 mg p.o. twice daily.  She said  no significant recurrent palpitations.      Lorretta Harp MD FACP,FACC,FAHA, [facility] 08/25/2020 2:10 PM

## 2020-08-25 NOTE — Assessment & Plan Note (Signed)
History of essential hypertension worse after her episode of Covid in the fall currently on carvedilol and losartan with blood pressure measured in the office of 130/62.

## 2020-08-25 NOTE — Patient Instructions (Signed)
Medication Instructions:  Your physician recommends that you continue on your current medications as directed. Please refer to the Current Medication list given to you today.  *If you need a refill on your cardiac medications before your next appointment, please call your pharmacy*   Lab Work: Your physician recommends that you return for lab work in: about 2 months for fasting lipid/liver profile.  If you have labs (blood work) drawn today and your tests are completely normal, you will receive your results only by: [name] MyChart Message (if you have MyChart) OR . A paper copy in the mail If you have any lab test that is abnormal or we need to change your treatment, we will call you to review the results.   Follow-Up: At [facility], you and your health needs are our priority.  As part of our continuing mission to provide you with exceptional heart care, we have created designated Provider Care Teams.  These Care Teams include your primary Cardiologist (physician) and Advanced Practice Providers (APPs -  Physician Assistants and Nurse Practitioners) who all work together to provide you with the care you need, when you need it.  We recommend signing up for the patient portal called "MyChart".  Sign up information is provided on this After Visit Summary.  MyChart is used to connect with patients for Virtual Visits (Telemedicine).  Patients are able to view lab/test results, encounter notes, upcoming appointments, etc.  Non-urgent messages can be sent to your provider as well.   To learn more about what you can do with MyChart, go to [email or web address].    Your next appointment:   3 month(s)  The format for your next appointment:   In Person  Provider:   You will see one of the following Advanced Practice Providers on your designated Care Team:    Sande Rives, PA-C  Coletta Memos, FNP  Then, Quay Burow, MD will plan to see you again in 6 month(s).

## 2020-08-25 NOTE — Therapy (Signed)
[facility] [address] [place], [place], [address] Phone: [phone]   Fax:  [phone]  Occupational Therapy Treatment  Patient Details  Name: Tina Horton MRN: [id number] Date of Birth: [birth year] No data recorded  Encounter Date: 08/25/2020   OT End of Session - 08/25/20 0851    Visit Number 3    Number of Visits 9    Date for OT Re-Evaluation 09/11/20    Authorization Type [employer] 2022    Authorization Time Period $20 per discipline    OT Start Time 0847    OT Stop Time 0925    OT Time Calculation (min) 38 min    Activity Tolerance Patient tolerated treatment well    Behavior During Therapy [facility] for tasks assessed/performed           Past Medical History:  Diagnosis Date  . Anemia   . Arthritis    knees  . Bronchitis   . Diabetes mellitus without complication [facility])    age 55yo  . GERD (gastroesophageal reflux disease)   . Glaucoma    Dr. Einar Gip  . H/O mammogram 2005  . Headache   . Heart murmur   . Hyperlipidemia   . Hypertension   . Intermittent palpitations   . Legally blind   . Myopia   . Pneumonia   . Routine gynecological examination    last pap 2005  . Seasonal allergies   . Shortness of breath dyspnea   . Sinusitis   . Thyroid nodule     Past Surgical History:  Procedure Laterality Date  . CYSTECTOMY     umbilicus  . INCISION AND DRAINAGE     abdominal superficial abscess  . LAPAROSCOPIC VAGINAL HYSTERECTOMY WITH SALPINGECTOMY Bilateral 09/13/2015   Procedure: LAPAROSCOPIC ASSISTED VAGINAL HYSTERECTOMY WITH SALPINGECTOMY, McCalls Colpoplasty;  Surgeon: Thurnell Lose, MD;  Location: [facility];  Service: Gynecology;  Laterality: Bilateral;  . LASIK     x2    Vitals:   08/25/20 0859  BP: (!) 159/92  Pulse: 74     Subjective Assessment - 08/25/20 0851    Subjective  Pt reports feeling wonderful. Pt denies any pain.    Patient is accompanied by: Family  member    Pertinent History PMH: CVA, Anemia, OA in knees, DM, glaucoma, heart murmur, HLD, HTN, palpitations, legally blind, PNA, thyroid nodule, sinusitis    Limitations Fall Risk. Legally Blind.    Patient Stated Goals "get movement and stuff back to normal in my arm"    Currently in Pain? No/denies           TREATMENT:  ADLs: pt reports getting around the house a lot easier now with her walking stick. Pt reports fixing her own juices, etc. Discussed strategies for pouring liquids and measuring to make sure it does not over fill. Using the finger method.  Hand Gripper: with LUE on level 2 with silver spring. Pt picked up 1 inch blocks with gripper with min drops and min difficulty. Downgraded to gold spring on level 2 approx 50% thorough                      OT Education - 08/25/20 0919    Education Details Yellow Theraband Exercises    Person(s) Educated Patient    Methods Explanation;Demonstration    Comprehension Verbalized understanding;Returned demonstration               OT Long Term Goals -  08/25/20 0853      OT LONG TERM GOAL #1   Title Pt will be independent with HEP for proximal and grip strengthening for LUE 09/11/2020    Time 4    Period Weeks    Status On-going      OT LONG TERM GOAL #2   Title Pt will perform shoe tying with mod I    Time 4    Period Weeks    Status Achieved   pt reports getting better - did with no difficulty in clinic     OT LONG TERM GOAL #3   Title Pt will verbalize understanding of adapted strategies and equipment PRN for increasing safety and independence with ADLs and IADLs (tying shoes, fixing hair, fixing snacks, etc)    Time 4    Period Weeks    Status On-going      OT LONG TERM GOAL #4   Title Pt will increase grip strength to 24 lbs or greater in LUE for increasing functional use and increasing ability to perform clothing management, etc    Baseline RUE 49.1 LUE 19.6    Time 4    Period Weeks     Status On-going      OT LONG TERM GOAL #5   Title Pt will report increased ease and independence with obtaining light snack and/or beverage from kitchen with mod I.    Time 4    Period Weeks    Status On-going   pt reports going and fixing her juice Independently                Plan - 08/25/20 8502    Clinical Impression Statement pt progressing towards goals.    OT Occupational Profile and History Problem Focused Assessment - Including review of records relating to presenting problem    Occupational performance deficits (Please refer to evaluation for details): IADL's;ADL's    Body Structure / Function / Physical Skills ADL;Decreased knowledge of use of DME;Coordination;IADL;Strength;GMC;Vision;Dexterity;FMC;Mobility;ROM;UE functional use;Flexibility    Rehab Potential Good    Clinical Decision Making Limited treatment options, no task modification necessary    Comorbidities Affecting Occupational Performance: None    Modification or Assistance to Complete Evaluation  No modification of tasks or assist necessary to complete eval    OT Frequency 2x / week    OT Duration 4 weeks   8 visits   OT Treatment/Interventions Self-care/ADL training;Neuromuscular education;Therapeutic exercise;DME and/or AE instruction;Therapeutic activities;Functional Mobility Training;Patient/family education;Visual/perceptual remediation/compensation    Plan Pt is legally blind. LUE shoulder exercises    Consulted and Agree with Plan of Care Patient;Family member/caregiver    Family Member Consulted patient's spouse           Patient will benefit from skilled therapeutic intervention in order to improve the following deficits and impairments:   Body Structure / Function / Physical Skills: ADL,Decreased knowledge of use of DME,Coordination,IADL,Strength,GMC,Vision,Dexterity,FMC,Mobility,ROM,UE functional use,Flexibility       Visit Diagnosis: Muscle weakness (generalized)  Other lack of  coordination  Unsteadiness on feet  Hemiplegia and hemiparesis following cerebral infarction affecting left non-dominant side (HCC)  Blindness, one eye, low vision other eye, unspecified eyes    Problem List Patient Active Problem List   Diagnosis Date Noted  . Anemia of renal disease 08/22/2020  . Difficulty agreeing with care plan 08/22/2020  . History of recent stroke 08/07/2020  . Left-sided weakness 08/07/2020  . Screen for colon cancer 08/07/2020  . Encounter for screening mammogram for malignant neoplasm of  breast 08/07/2020  . S/P hysterectomy 08/07/2020  . Nasal congestion 08/07/2020  . Snoring 08/07/2020  . CVA (cerebral vascular accident) ([place]) 07/27/2020  . Acute kidney injury superimposed on chronic kidney disease ([place]) 07/27/2020  . Palpitation 07/24/2020  . History of COVID-19 05/22/2020  . COVID-19 virus infection 02/25/2020  . Sinus pressure 02/25/2020  . Acute non-recurrent frontal sinusitis 02/25/2020  . Chronic pain of both knees 01/04/2020  . 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 CKD (chronic kidney disease) stage 2, GFR 60-89 ml/min 11/29/2019  . 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 Vitamin D deficiency 03/13/2018  . Primary osteoarthritis of both knees 01/20/2018  . DDD (degenerative disc disease), lumbar 01/20/2018  . Localized swelling, mass or lump of neck 10/30/2017  . Screening for cancer 08/20/2017  . Left leg pain 08/20/2017  . Renal cyst 01/15/2016  . Hydronephrosis of left kidney 01/15/2016  . Spinal stenosis 01/15/2016  . History of burning pain in leg 12/04/2015  . S/P laparoscopic assisted vaginal hysterectomy (LAVH) 09/13/2015  . Encounter for health maintenance examination in adult 08/21/2015  . Hyperlipidemia 08/21/2015  . Glaucoma 08/21/2015  . Heart murmur 08/21/2015  . Thyroid nodule 08/21/2015  . Vaccine refused by patient 08/21/2015  . Noncompliance 03/21/2015  . Essential hypertension 03/21/2015  . Uncontrolled diabetes mellitus  with complication, with long-term current use of insulin ([place]) 03/21/2015  . Lymphadenitis 03/21/2015  . SKIN LESION 10/19/2008  . SYSTOLIC MURMUR 62/83/1517  . VISUAL DISTURBANCE NOS 07/17/2006  . REFLUX ESOPHAGITIS 07/17/2006    Zachery Conch 08/25/2020, 9:25 AM  [facility] [address] [place], [place], [address] Phone: [phone]   Fax:  [phone]  Name: Tina Horton MRN: [id number] Date of Birth: Apr 07, 1966

## 2020-08-25 NOTE — Assessment & Plan Note (Signed)
History of hyperlipidemia with lipid profile performed 07/24/2020 revealing total cholesterol of 228, LDL 165 and HDL 48.  She was recently begun on atorvastatin 80 mg a day.  We will recheck a lipid liver profile in 2 months.

## 2020-08-25 NOTE — Therapy (Signed)
[facility] [address] [place], [place], [address] Phone: [phone]   Fax:  [phone]  Physical Therapy Treatment  Patient Details  Name: Tina Horton MRN: [id number] Date of Birth: [birth year] Referring Provider (PT): Referred by hospitalist; will send certification to PCP: Carlena Hurl, PA-C   Encounter Date: 08/25/2020   PT End of Session - 08/25/20 1037    Visit Number 6    Number of Visits 9    Date for PT Re-Evaluation 08/31/20    Authorization Type [employer]; 9 visits from 3/15 - 08/31/20    PT Start Time 0942    PT Stop Time 1020    PT Time Calculation (min) 38 min    Activity Tolerance Patient tolerated treatment well    Behavior During Therapy [facility] for tasks assessed/performed           Past Medical History:  Diagnosis Date  . Anemia   . Arthritis    knees  . Bronchitis   . Diabetes mellitus without complication [facility])    age 85yo  . GERD (gastroesophageal reflux disease)   . Glaucoma    Dr. Einar Gip  . H/O mammogram 2005  . Headache   . Heart murmur   . Hyperlipidemia   . Hypertension   . Intermittent palpitations   . Legally blind   . Myopia   . Pneumonia   . Routine gynecological examination    last pap 2005  . Seasonal allergies   . Shortness of breath dyspnea   . Sinusitis   . Thyroid nodule     Past Surgical History:  Procedure Laterality Date  . CYSTECTOMY     umbilicus  . INCISION AND DRAINAGE     abdominal superficial abscess  . LAPAROSCOPIC VAGINAL HYSTERECTOMY WITH SALPINGECTOMY Bilateral 09/13/2015   Procedure: LAPAROSCOPIC ASSISTED VAGINAL HYSTERECTOMY WITH SALPINGECTOMY, McCalls Colpoplasty;  Surgeon: Thurnell Lose, MD;  Location: [facility];  Service: Gynecology;  Laterality: Bilateral;  . LASIK     x2    Vitals:   08/25/20 0952 08/25/20 1019  BP: (!) 152/64 (!) 150/66     Subjective Assessment - 08/25/20 0944    Subjective Pt reports she  went called her neurologist because her BP has been spiking when measuring at home and her neurologist referred her to her PCP.  Her PCP updated medication to help with her BP. She will pick up her clonodine today.  She says her BP is usually high sfter walking long distances so her husband got her a power chair so she can go outside of the house for longer distances.  She went and saw her cardiologist and there is nothing to report.  She still has not been able to do her exercises at home.  She is walking around her house.    Patient is accompained by: Family member    Pertinent History anemia, OA in knees, DM, glaucoma, heart murmur, HLD, HTN, palpitations, legally blind, PNA, thyroid nodule, sinusitis    Limitations Walking    Currently in Pain? No/denies                             [facility] Adult PT Treatment/Exercise - 08/25/20 1034      Ambulation/Gait   Ambulation/Gait Yes    Ambulation/Gait Assistance 5: Supervision    Ambulation/Gait Assistance Details Walking with blind cane and therapist cueing as needed due to visual deficiencies; no significant  lateral trunk lean noted and pt dispalyed increased step length compared to last session    Ambulation Distance (Feet) 230 Feet    Gait Pattern Step-through pattern    Ambulation Surface Level;Indoor               Balance Exercises - 08/25/20 1002      Balance Exercises: Standing   SLS with Vectors Solid surface;Limitations   x20 alternating touches onto cone 90 from horizontal, x15 cone touches each leg 45 deg from horizontal   SLS with Vectors Limitations Lateral trunk lean to R and intermittent limitation in maintaining SLS on RLE.  Tactile and verbal cueing to maintain alignment    Step Ups Forward;Lateral;Limitations   onto blue foam pad, x10 each forward into high knee, x10 lateral each; CGA wihtout UE support   Step Ups Limitations Tactile and verbal cueing at hips to maintain neutral pelvic alignment, pt able  to self correct following cueing.  Decreased step height on L.    Step Over Hurdles / Cones x4 laps reciprocal stepping over 4 4in hurdles.  No UE support and CGA.  Challenged SLS time and increased step length.  Intermittent instability noted and one loss of balance requiring therapist support.             PT Education - 08/25/20 1036    Education Details Normal and abnormal changes in BP after exercise, postural alignment during execises, increasing step length with gait    Person(s) Educated Patient    Methods Explanation    Comprehension Verbalized understanding               PT Long Term Goals - 08/01/20 1728      PT LONG TERM GOAL #1   Title Pt will demonstrate ability to safely perform HEP with family's supervision    Time 4    Period Weeks    Status New    Target Date 08/31/20      PT LONG TERM GOAL #2   Title Pt will decrease five time sit to stand to </=20 seconds with supervision without pushing back of knees against mat for stability    Baseline 45.44 sec; weight shift posterior and to the left, contact with mat on posterior portion of knee during all reps    Time 4    Period Weeks    Status New    Target Date 08/31/20      PT LONG TERM GOAL #3   Title Pt will increase gait velocity using blind cane to 0.8 m/sec to indicate decreased falls risk in the community    Baseline .37 m/sec    Time 4    Period Weeks    Status New    Target Date 08/31/20      PT LONG TERM GOAL #4   Title Pt will increase BERG balance score to >/= 41/56    Baseline 33/56    Time 4    Period Weeks    Status New    Target Date 08/31/20      PT LONG TERM GOAL #5   Title Pt will ambulate x 230' over level, indoor surfaces and around various obstacles with use of blind cane and supervision with no evidence of LOB to the L    Time 4    Period Weeks    Status New    Target Date 08/31/20                 Plan -  08/25/20 1039    Clinical Impression Statement Pts BP  responded normal today during therapy, but extra rest breaks were given between exercises to ensure no abnormal rise due to recent spikes and increased dosage of HTN medication.  Continued single limb stance exercise and dynamic balance to improve gait and address LE weakness.  Pt dispalyed more instability compared to previous sessions and required intermittent therapist support to prevent LOB.  Pt able to self correct and maintian neutral pelvic alignment after tactile and verbal cueing.    Personal Factors and Comorbidities Comorbidity 3+;Past/Current Experience    Comorbidities anemia, OA in knees, DM, glaucoma, heart murmur, HLD, HTN, palpitations, legally blind, PNA, thyroid nodule, sinusitis    Examination-Activity Limitations Bend;Lift;Locomotion Level;Reach Overhead;Stairs;Stand;Transfers    Examination-Participation Restrictions Cleaning;Community Activity;Meal Prep;Shop    Stability/Clinical Decision Making Evolving/Moderate complexity    Rehab Potential Good    PT Frequency 2x / week    PT Duration 4 weeks    PT Treatment/Interventions ADLs/Self Care Home Management;DME Instruction;Gait training;Stair training;Functional mobility training;Therapeutic activities;Therapeutic exercise;Balance training;Neuromuscular re-education;Patient/family education    PT Next Visit Plan Start checking LTG and decide whether to D/C or recert.  Assess Vitals. Increasing endurance. Gait over compliant surfaces. Continue LLE strengthening, standing balance, SLS activities and balance reactions; rockerboard, perturbations and stepping exercises.  Gait with blind cane and supervision.    Consulted and Agree with Plan of Care Patient;Family member/caregiver    Family Member Consulted husband           Patient will benefit from skilled therapeutic intervention in order to improve the following deficits and impairments:  Decreased balance,Decreased coordination,Abnormal gait,Decreased strength,Difficulty  walking,Impaired UE functional use  Visit Diagnosis: Hemiplegia and hemiparesis following cerebral infarction affecting left non-dominant side (HCC)  Muscle weakness (generalized)  Unsteadiness on feet  Other lack of coordination     Problem List Patient Active Problem List   Diagnosis Date Noted  . Anemia of renal disease 08/22/2020  . Difficulty agreeing with care plan 08/22/2020  . History of recent stroke 08/07/2020  . Left-sided weakness 08/07/2020  . Screen for colon cancer 08/07/2020  . Encounter for screening mammogram for malignant neoplasm of breast 08/07/2020  . S/P hysterectomy 08/07/2020  . Nasal congestion 08/07/2020  . Snoring 08/07/2020  . CVA (cerebral vascular accident) ([place]) 07/27/2020  . Acute kidney injury superimposed on chronic kidney disease ([place]) 07/27/2020  . Palpitation 07/24/2020  . History of COVID-19 05/22/2020  . COVID-19 virus infection 02/25/2020  . Sinus pressure 02/25/2020  . Acute non-recurrent frontal sinusitis 02/25/2020  . Chronic pain of both knees 01/04/2020  . 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 CKD (chronic kidney disease) stage 2, GFR 60-89 ml/min 11/29/2019  . 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 Vitamin D deficiency 03/13/2018  . Primary osteoarthritis of both knees 01/20/2018  . DDD (degenerative disc disease), lumbar 01/20/2018  . Localized swelling, mass or lump of neck 10/30/2017  . Screening for cancer 08/20/2017  . Left leg pain 08/20/2017  . Renal cyst 01/15/2016  . Hydronephrosis of left kidney 01/15/2016  . Spinal stenosis 01/15/2016  . History of burning pain in leg 12/04/2015  . S/P laparoscopic assisted vaginal hysterectomy (LAVH) 09/13/2015  . Encounter for health maintenance examination in adult 08/21/2015  . Hyperlipidemia 08/21/2015  . Glaucoma 08/21/2015  . Heart murmur 08/21/2015  . Thyroid nodule 08/21/2015  . Vaccine refused by patient 08/21/2015  . Noncompliance 03/21/2015  . Essential hypertension  03/21/2015  . Uncontrolled diabetes mellitus with complication, with  long-term current use of insulin ([place]) 03/21/2015  . Lymphadenitis 03/21/2015  . SKIN LESION 10/19/2008  . SYSTOLIC MURMUR 59/96/8957  . VISUAL DISTURBANCE NOS 07/17/2006  . REFLUX ESOPHAGITIS 07/17/2006    Yetta Numbers, SPT 08/25/2020, 10:47 AM  [facility] [address] [place], [place], [address] Phone: [phone]   Fax:  [phone]  Name: Tina Horton MRN: [id number] Date of Birth: Sep 28, 1965

## 2020-08-28 ENCOUNTER — Ambulatory Visit: Payer: Medicare HMO | Admitting: Physical Therapy

## 2020-08-28 ENCOUNTER — Ambulatory Visit: Payer: Medicare HMO | Admitting: Occupational Therapy

## 2020-08-29 ENCOUNTER — Ambulatory Visit: Payer: Medicare HMO | Admitting: Student

## 2020-08-31 ENCOUNTER — Other Ambulatory Visit: Payer: Self-pay

## 2020-08-31 ENCOUNTER — Ambulatory Visit: Payer: Medicare HMO

## 2020-08-31 VITALS — BP 122/70 | HR 74

## 2020-08-31 DIAGNOSIS — M6281 Muscle weakness (generalized): Secondary | ICD-10-CM | POA: Diagnosis not present

## 2020-08-31 DIAGNOSIS — R2681 Unsteadiness on feet: Secondary | ICD-10-CM

## 2020-08-31 DIAGNOSIS — H5410 Blindness, one eye, low vision other eye, unspecified eyes: Secondary | ICD-10-CM | POA: Diagnosis not present

## 2020-08-31 DIAGNOSIS — R278 Other lack of coordination: Secondary | ICD-10-CM | POA: Diagnosis not present

## 2020-08-31 DIAGNOSIS — I69354 Hemiplegia and hemiparesis following cerebral infarction affecting left non-dominant side: Secondary | ICD-10-CM | POA: Diagnosis not present

## 2020-08-31 DIAGNOSIS — R2689 Other abnormalities of gait and mobility: Secondary | ICD-10-CM | POA: Diagnosis not present

## 2020-08-31 NOTE — Therapy (Signed)
[employer] [address] [place], [place], [address] Phone: [phone]   Fax:  [phone]  Physical Therapy Treatment/Discharge Summary  Patient Details  Name: Tina Horton MRN: [id number] Date of Birth: [birth year] Referring Provider (PT): Referred by hospitalist; will send certification to PCP: Carlena Hurl, PA-C   Encounter Date: 08/31/2020   PT End of Session - 08/31/20 0940    Visit Number 7    Number of Visits 9    Date for PT Re-Evaluation 08/31/20    Authorization Type [employer]; 9 visits from 3/15 - 08/31/20    PT Start Time 8115    PT Stop Time 1010   discharge visit   PT Time Calculation (min) 32 min    Activity Tolerance Patient tolerated treatment well    Behavior During Therapy [facility] for tasks assessed/performed           Past Medical History:  Diagnosis Date  . Anemia   . Arthritis    knees  . Bronchitis   . Diabetes mellitus without complication [facility])    age 55yo  . GERD (gastroesophageal reflux disease)   . Glaucoma    Dr. Einar Gip  . H/O mammogram 2005  . Headache   . Heart murmur   . Hyperlipidemia   . Hypertension   . Intermittent palpitations   . Legally blind   . Myopia   . Pneumonia   . Routine gynecological examination    last pap 2005  . Seasonal allergies   . Shortness of breath dyspnea   . Sinusitis   . Thyroid nodule     Past Surgical History:  Procedure Laterality Date  . CYSTECTOMY     umbilicus  . INCISION AND DRAINAGE     abdominal superficial abscess  . LAPAROSCOPIC VAGINAL HYSTERECTOMY WITH SALPINGECTOMY Bilateral 09/13/2015   Procedure: LAPAROSCOPIC ASSISTED VAGINAL HYSTERECTOMY WITH SALPINGECTOMY, McCalls Colpoplasty;  Surgeon: Thurnell Lose, MD;  Location: [facility];  Service: Gynecology;  Laterality: Bilateral;  . LASIK     x2    Vitals:   08/31/20 1006  BP: 122/70  Pulse: 74     Subjective Assessment - 08/31/20 0942     Subjective Pt reports she is doing well. Denies any issues around home. She has returned to doing all things for herself at home. Gets heart rate monitor off today so will then be able to shower. Still using transport chair if does any really long walks due to continued BP spikes at times.    Patient is accompained by: Family member    Pertinent History anemia, OA in knees, DM, glaucoma, heart murmur, HLD, HTN, palpitations, legally blind, PNA, thyroid nodule, sinusitis    Limitations Walking    Currently in Pain? No/denies                             [facility] Adult PT Treatment/Exercise - 08/31/20 0944      Transfers   Transfers Sit to Stand;Stand to Sit    Sit to Stand 7: Independent    Five time sit to stand comments  15 sec from mat without hands    Stand to Sit 7: Independent      Ambulation/Gait   Ambulation/Gait Yes    Ambulation/Gait Assistance 5: Supervision    Ambulation/Gait Assistance Details Pt able to negotiate around some obstacles in path that PT placed with use of blind cane    Ambulation  Distance (Feet) 375 Feet    Assistive device --   blind cane   Gait Pattern Step-through pattern    Ambulation Surface Level;Indoor    Gait velocity 13.43 sec=0.91ms      Standardized Balance Assessment   Standardized Balance Assessment Berg Balance Test      Berg Balance Test   Sit to Stand Able to stand without using hands and stabilize independently    Standing Unsupported Able to stand safely 2 minutes    Sitting with Back Unsupported but Feet Supported on Floor or Stool Able to sit safely and securely 2 minutes    Stand to Sit Sits safely with minimal use of hands    Transfers Able to transfer safely, minor use of hands    Standing Unsupported with Eyes Closed Able to stand 10 seconds safely    Standing Ubsupported with Feet Together Able to place feet together independently and stand 1 minute safely    From Standing, Reach Forward with Outstretched Arm Can  reach confidently >25 cm (10")    From Standing Position, Pick up Object from Floor Able to pick up shoe safely and easily    From Standing Position, Turn to Look Behind Over each Shoulder Looks behind from both sides and weight shifts well    Turn 360 Degrees Able to turn 360 degrees safely but slowly    Standing Unsupported, Alternately Place Feet on Step/Stool Able to stand independently and safely and complete 8 steps in 20 seconds    Standing Unsupported, One Foot in Front Able to plae foot ahead of the other independently and hold 30 seconds    Standing on One Leg Tries to lift leg/unable to hold 3 seconds but remains standing independently    Total Score 50                  PT Education - 08/31/20 1501    Education Details Discussed results of testing and progress. Pt agrees with discharge today. Pt denies any questions with HEP.    Person(s) Educated Patient    Methods Explanation    Comprehension Verbalized understanding               PT Long Term Goals - 08/31/20 0945      PT LONG TERM GOAL #1   Title Pt will demonstrate ability to safely perform HEP with family's supervision    Baseline Pt reports that she has not been doing exercises much as she still is waiting on husband to get the pillow she wants for her but feels she will be fine with it. Reviewed HEP during session.    Time 4    Period Weeks    Status Achieved      PT LONG TERM GOAL #2   Title Pt will decrease five time sit to stand to </=20 seconds with supervision without pushing back of knees against mat for stability    Baseline 45.44 sec; weight shift posterior and to the left, contact with mat on posterior portion of knee during all reps. 08/31/20 15 sec from mat without hands    Time 4    Period Weeks    Status Achieved      PT LONG TERM GOAL #3   Title Pt will increase gait velocity using blind cane to 0.8 m/sec to indicate decreased falls risk in the community    Baseline .37 m/sec. 08/31/20  0.740m    Time 4    Period Weeks  Status Partially Met      PT LONG TERM GOAL #4   Title Pt will increase BERG balance score to >/= 41/56    Baseline 33/56. 08/31/20 50/56    Time 4    Period Weeks    Status Achieved      PT LONG TERM GOAL #5   Title Pt will ambulate x 230' over level, indoor surfaces and around various obstacles with use of blind cane and supervision with no evidence of LOB to the L    Baseline 375' supervision with blind cane    Time 4    Period Weeks    Status Achieved                 Plan - 08/31/20 1502    Clinical Impression Statement Pt has progressed with therapy meeting 4/5 LTGs. She was just shy of gait speed goal but increased from 0.37 to 0.74m/s showing significant improvement. She is able to walk with use of blind cane supervision. She increased Berg to 50/56 and decreased 5 x sit to stand to 15 sec indicating decreased fall risk and improved balance. Pt feels good with starting to do HEP at home and agreeable to discharge today. She feels she is just about back to baseline.    Personal Factors and Comorbidities Comorbidity 3+;Past/Current Experience    Comorbidities anemia, OA in knees, DM, glaucoma, heart murmur, HLD, HTN, palpitations, legally blind, PNA, thyroid nodule, sinusitis    Examination-Activity Limitations Bend;Lift;Locomotion Level;Reach Overhead;Stairs;Stand;Transfers    Examination-Participation Restrictions Cleaning;Community Activity;Meal Prep;Shop    Stability/Clinical Decision Making Evolving/Moderate complexity    Rehab Potential Good    PT Frequency 2x / week    PT Duration 4 weeks    PT Treatment/Interventions ADLs/Self Care Home Management;DME Instruction;Gait training;Stair training;Functional mobility training;Therapeutic activities;Therapeutic exercise;Balance training;Neuromuscular re-education;Patient/family education    PT Next Visit Plan Discharge PT today.    Consulted and Agree with Plan of Care Patient;Family  member/caregiver    Family Member Consulted husband           Patient will benefit from skilled therapeutic intervention in order to improve the following deficits and impairments:  Decreased balance,Decreased coordination,Abnormal gait,Decreased strength,Difficulty walking,Impaired UE functional use  Visit Diagnosis: Other abnormalities of gait and mobility  Unsteadiness on feet     Problem List Patient Active Problem List   Diagnosis Date Noted  . Anemia of renal disease 08/22/2020  . Difficulty agreeing with care plan 08/22/2020  . History of recent stroke 08/07/2020  . Left-sided weakness 08/07/2020  . Screen for colon cancer 08/07/2020  . Encounter for screening mammogram for malignant neoplasm of breast 08/07/2020  . S/P hysterectomy 08/07/2020  . Nasal congestion 08/07/2020  . Snoring 08/07/2020  . CVA (cerebral vascular accident) (HCC) 07/27/2020  . Acute kidney injury superimposed on chronic kidney disease (HCC) 07/27/2020  . Palpitation 07/24/2020  . History of COVID-19 05/22/2020  . COVID-19 virus infection 02/25/2020  . Sinus pressure 02/25/2020  . Acute non-recurrent frontal sinusitis 02/25/2020  . Chronic pain of both knees 01/04/2020  . 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 CKD (chronic kidney disease) stage 2, GFR 60-89 ml/min 11/29/2019  . 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 Vitamin D deficiency 03/13/2018  . Primary osteoarthritis of both knees 01/20/2018  . DDD (degenerative disc disease), lumbar 01/20/2018  . Localized swelling, mass or lump of neck 10/30/2017  . Screening for cancer 08/20/2017  . Left leg pain 08/20/2017  . Renal cyst 01/15/2016  . Hydronephrosis   of left kidney 01/15/2016  . Spinal stenosis 01/15/2016  . History of burning pain in leg 12/04/2015  . S/P laparoscopic assisted vaginal hysterectomy (LAVH) 09/13/2015  . Encounter for health maintenance examination in adult 08/21/2015  . Hyperlipidemia 08/21/2015  . Glaucoma 08/21/2015  .  Heart murmur 08/21/2015  . Thyroid nodule 08/21/2015  . Vaccine refused by patient 08/21/2015  . Noncompliance 03/21/2015  . Essential hypertension 03/21/2015  . Uncontrolled diabetes mellitus with complication, with long-term current use of insulin ([place]) 03/21/2015  . Lymphadenitis 03/21/2015  . SKIN LESION 10/19/2008  . SYSTOLIC MURMUR 76/19/5093  . VISUAL DISTURBANCE NOS 07/17/2006  . REFLUX ESOPHAGITIS 07/17/2006    Electa Sniff, PT, DPT, NCS 08/31/2020, 3:08 PM  [facility] [address] [place], [place], [address] Phone: [phone]   Fax:  [phone]  Name: Tina Horton MRN: [id number] Date of Birth: 07/20/1965

## 2020-09-04 ENCOUNTER — Ambulatory Visit: Payer: Medicare HMO | Admitting: Occupational Therapy

## 2020-09-04 NOTE — Therapy (Signed)
[facility] [address] [place], [place], [address] Phone: [phone]   Fax:  [phone]  Patient Details  Name: Tina Horton MRN: [id number] Date of Birth: [birth year] Referring Provider:  Jonetta Osgood, MD  Encounter Date: 08/25/2020   OCCUPATIONAL THERAPY DISCHARGE SUMMARY   Visits from Start of Care: 3  Current functional level related to goals / functional outcomes: Pt has made a lot of progress in occupational therapy. Pt did not return after last visit and agreed on discharge from OT d/t progress. Remaining goals were not able to be assessed.   Remaining deficits: See last note.    Education / Equipment: Education was not completed as pt did not return.  Plan: Patient agrees to discharge.  Patient goals were partially met. Patient is being discharged due to not returning since the last visit.  ?????        Zachery Conch MOT, OTR/L  09/04/2020, 10:09 AM   [facility] [address] [place], [place], [address] Phone: [phone]   Fax:  [phone]

## 2020-09-05 ENCOUNTER — Other Ambulatory Visit: Payer: Self-pay | Admitting: Physician Assistant

## 2020-09-05 DIAGNOSIS — R002 Palpitations: Secondary | ICD-10-CM

## 2020-09-05 DIAGNOSIS — I4891 Unspecified atrial fibrillation: Secondary | ICD-10-CM

## 2020-09-05 DIAGNOSIS — I639 Cerebral infarction, unspecified: Secondary | ICD-10-CM

## 2020-09-05 DIAGNOSIS — I6302 Cerebral infarction due to thrombosis of basilar artery: Secondary | ICD-10-CM

## 2020-09-08 ENCOUNTER — Other Ambulatory Visit: Payer: Self-pay

## 2020-09-08 ENCOUNTER — Telehealth: Payer: Self-pay | Admitting: Medical

## 2020-09-08 ENCOUNTER — Ambulatory Visit: Payer: Medicare HMO | Admitting: Neurology

## 2020-09-08 ENCOUNTER — Ambulatory Visit: Payer: Medicare HMO | Admitting: Occupational Therapy

## 2020-09-08 VITALS — BP 122/75 | HR 74 | Ht 61.0 in | Wt 134.4 lb

## 2020-09-08 DIAGNOSIS — I639 Cerebral infarction, unspecified: Secondary | ICD-10-CM

## 2020-09-08 NOTE — Telephone Encounter (Signed)
  Can she take colace with her current meds She had been having some constipation issues.

## 2020-09-08 NOTE — Progress Notes (Signed)
Follow-up Visit   Date: 09/08/20   Jamisyn Langer MRN: [id number] DOB: 02/28/1966   Interim History: Myrka Sylva is a 55 y.o. right-handed female with insulin-dependent diabetes mellitus, hyperlipidemia, hypertension, severe myopia and glaucoma - essentially legally blind but able to see light OU presenting for evaluate of stroke returning to the clinic for follow-up of stroke.  The patient was accompanied to the clinic by husband who also provides collateral information.    History of present illness: She presented to the ER on 3/8 with imbalance and was discharged. On 3/10, she woke up with left arm and leg weakness with worsening imbalance.  She was taken to the ER where CT head showed acute right basal ganglia and corona radiata stroke.  MRI brain confirmed the presence of the stroke, MRA head and US carotid was normal.   NIHSS 2.  She did not receive tPA.  Etiology suspect to be small vessel disease. She was started on atorvastatin $RemoveBeforeD'80mg'KwbOLFSEhdtmPl$  and dual antiplatelet therapy with aspirin $RemoveBefo'81mg'MzPTuQWGjIW$  + plavix $Remove'75mg'QuFuDes$  daily.  She was taking aspirin $RemoveBefore'81mg'AfiLvPhwewOlh$  previously.  Her blood pressure was noted to be elevated (170-180s).    Since she has been home, her left side weakness is improving.  She has some weakness with the left hand with coordination. She continues to have some imbalance and is very careful when walking.  At home she has someone next to her. She has not started PT yet.  She denies any numbness/tingling, dysphagia, or dysarthria.  Her vision is impaired at baseline, no new changes.   UPDATE 09/08/2020:  She is here because of complaints that her blood pressure was elevated (160s) and I encouraged her to discuss this with her nephrologist, who is managed her BP.  She has been started on clonidine which is better controlling her BP.  She is monitoring this multiple times a day, husband says even every hour.  She is very concerned that her elevated BP will result in another stroke.  She  denies any new neurological symptoms.  Her left sided weakness has markedly improved, she has slight weakness in the hand only.  Her balance is also improving.  She walks unassisted at home. She has completed PT and OT early after meeting goals.  She continues to use a transport chair for long distances.   Medications:  Current Outpatient Medications on File Prior to Visit  Medication Sig Dispense Refill  . Alcohol Swabs (B-D SINGLE USE SWABS REGULAR) PADS USE AS DIRECTED WHEN TESTING BLOOD SUGAR 300 each 0  . aspirin EC 81 MG tablet Take 1 tablet (81 mg total) by mouth daily. 90 tablet 3  . atorvastatin (LIPITOR) 40 MG tablet Take 40 mg by mouth daily.    . Blood Glucose Monitoring Suppl (PRODIGY AUTOCODE BLOOD GLUCOSE) w/Device KIT     . carvedilol (COREG) 25 MG tablet Take 1 tablet (25 mg total) by mouth 2 (two) times daily. 180 tablet 3  . cloNIDine (CATAPRES) 0.1 MG tablet Take 1 tablet (0.1 mg total) by mouth 2 (two) times daily. 60 tablet 2  . dorzolamide-timolol (COSOPT) 22.3-6.8 MG/ML ophthalmic solution Place 1 drop into both eyes 2 (two) times daily.    [name] glucose blood (PRODIGY NO CODING BLOOD GLUC) test strip Use as instructed 100 each 12  . latanoprost (XALATAN) 0.005 % ophthalmic solution Place 1 drop into both eyes at bedtime.    [name] losartan (COZAAR) 100 MG tablet Take 1 tablet (100 mg total) by  mouth daily. 90 tablet 3  . NOVOFINE PLUS PEN NEEDLE 32G X 4 MM MISC USE 4 TIMES A DAY WITH INSULIN INJECTIONS 100 each 0  . NOVOLOG FLEXPEN 100 UNIT/ML FlexPen INJECT 20 UNITS SUBCUTANEOUSLY THREE TIMES DAILY WITH MEALS (USE SLIDING SCALE PROVIDED BY MD)(PEN EXPIRES AT 28 DAYS) (Patient taking differently: Inject 16 Units into the skin 3 (three) times daily with meals.) 60 mL 5  . PRODIGY NO CODING BLOOD GLUC test strip TEST BLOOD SUGAR THREE TIMES DAILY 300 strip 0  . Prodigy Twist Top Lancets 28G MISC USE TO TEST BLOOD SUGAR THREE TIMES DAILY 300 each 0  . VITAMIN D-VITAMIN K PO Take by  mouth 2 (two) times a week.     No current facility-administered medications on file prior to visit.    Allergies:  Allergies  Allergen Reactions  . Amlodipine Swelling    Swelling   . Metformin And Related Diarrhea and Nausea And Vomiting  . Tanzeum [Albiglutide] Nausea And Vomiting  . Lisinopril Other (See Comments)    Refuses, says it causes cancer    Vital Signs:  BP 122/75 (BP Location: Left Arm, Patient Position: Sitting, Cuff Size: Small)   Pulse 74   Ht 5\' 1"  (1.549 m)   Wt 134 lb 6.4 oz (61 kg)   LMP 09/13/2015   SpO2 100%   BMI 25.39 kg/m   Neurological Exam: MENTAL STATUS including orientation to time, place, person, recent and remote memory, attention span and concentration, language, and fund of knowledge is normal.  Speech is not dysarthric.  CRANIAL NERVES: Light perception only in the left eye, gross movements are perceivable in the right eye.   No visual field defects.  Pupils equal round and reactive to light.  Normal conjugate, extra-ocular eye movements in all directions of gaze.  No ptosis.  Face is symmetric.  MOTOR:  Motor strength is 5/5 in all extremities, except trace left hand weakness with grip.  No atrophy, fasciculations or abnormal movements.  No pronator drift.  Tone is normal.    MSRs:  Reflexes are 2+/4 throughout.  SENSORY:  Intact to vibration throughout.  COORDINATION/GAIT:  Normal finger-to- nose-finger.  Intact rapid alternating movements bilaterally. Unassisted gait appears slow, steady, and stable.  Data: summarized as:  CT head 07/27/20: Acute infarct in the right basal ganglia, extending into overlying corona radiata. Mild edema without mass effect.  MRI head 07/27/20:   1. 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 Associated edema without mass effect. 2. Bilateral posterior staphylomas.  MRA head 07/27/2020:   Normal intracranial MR angiography.  Echo 07/28/20:  EF 70-75%   US carotids  07/28/20:  1-39% bilaterally  IMPRESSION/PLAN: Right basal ganglia stroke manifesting with hemiataxia and hemiparesis (07/2020), improving.  Etiology - small vessel disease.  Management is optimizing risk factors such as diabetes, hypertension, and hyperlipidemia  - She has completed therapy with dual antiplatelets and now on aspirin 81mg  daily alone  - Continue atorvastatin 80mg  daily (LDL 165)  - BP management as per nephrology, BP is much better controlled.  I discouraged her from check BP so frequently and try to limit to twice per day  - Diabetes management as per PCP  - Encouraged her to continue home exercises  Return to clinic in 6 months   Thank you for allowing me to participate in patient's care.  If I can answer any additional questions, I would be pleased to do so.    Sincerely,  Herley Bernardini K. Posey Pronto, DO

## 2020-09-11 NOTE — Telephone Encounter (Signed)
yes

## 2020-09-18 ENCOUNTER — Telehealth: Payer: Self-pay

## 2020-09-18 NOTE — Telephone Encounter (Signed)
Recv'd fax from [facility] requesting refill Clonidine 0.1 mg

## 2020-09-18 NOTE — Telephone Encounter (Signed)
Patient has refill at local pharmacy

## 2020-09-20 ENCOUNTER — Telehealth: Payer: Self-pay | Admitting: Emergency Medicine

## 2020-09-20 NOTE — Telephone Encounter (Signed)
Good Afternoon Tina Horton,  I wanted to send you Dr. Clydene Fake review and findings regarding your cardiac monitor results.  Below is his statement       Kindly inform the patient that cardiac monitoring study did not show any atrial fibrillation or any worrisome arrhythmias.    If you have any questions or concerns, please feel free to contact me.  Thank you [name], RN

## 2020-09-20 NOTE — Telephone Encounter (Signed)
-----   Message from Garvin Fila, MD sent at 09/20/2020  6:52 AM EDT ----- Mitchell Heir inform the patient that cardiac monitoring study did not show any atrial fibrillation or any worrisome arrhythmias. ----- Message ----- From: Lorretta Harp, MD Sent: 09/06/2020   7:06 AM EDT To: Garvin Fila, MD

## 2020-10-09 DIAGNOSIS — I129 Hypertensive chronic kidney disease with stage 1 through stage 4 chronic kidney disease, or unspecified chronic kidney disease: Secondary | ICD-10-CM | POA: Diagnosis not present

## 2020-10-09 DIAGNOSIS — E1122 Type 2 diabetes mellitus with diabetic chronic kidney disease: Secondary | ICD-10-CM | POA: Diagnosis not present

## 2020-10-09 DIAGNOSIS — N1330 Unspecified hydronephrosis: Secondary | ICD-10-CM | POA: Diagnosis not present

## 2020-10-09 DIAGNOSIS — H548 Legal blindness, as defined in USA: Secondary | ICD-10-CM | POA: Diagnosis not present

## 2020-10-09 DIAGNOSIS — R809 Proteinuria, unspecified: Secondary | ICD-10-CM | POA: Diagnosis not present

## 2020-10-09 DIAGNOSIS — N182 Chronic kidney disease, stage 2 (mild): Secondary | ICD-10-CM | POA: Diagnosis not present

## 2020-10-09 DIAGNOSIS — E559 Vitamin D deficiency, unspecified: Secondary | ICD-10-CM | POA: Diagnosis not present

## 2020-10-10 ENCOUNTER — Telehealth: Payer: Self-pay | Admitting: Medical

## 2020-10-10 ENCOUNTER — Other Ambulatory Visit: Payer: Self-pay | Admitting: Medical

## 2020-10-10 MED ORDER — CLONIDINE HCL 0.1 MG PO TABS: 0.1000 mg | ORAL_TABLET | Freq: Two times a day (BID) | ORAL | 0 refills | Status: DC

## 2020-10-10 NOTE — Telephone Encounter (Signed)
Lets be clear, either her kidney specialist or her cardiologist should be the one managing her blood pressure medications.  Since [facility] is not all that quick to send [place] records, we never really are clear on what they have done or changed or stopped or started.  Moreover when you have 3 different people trying to manage blood pressure it complicates things and makes the chance of errors more likely.  I am not managing her blood pressure medication, and I have told her that.  Her kidney doctor or her heart doctor should be the one managing her blood pressure medication.  If I keep refilling the medication it looks as if I am the one managing that medication.  So let this be clear that future blood pressure medication refills need to be either through her heart doctor or her kidney doctor.  She should make her pharmacy aware this and she should also reach out to whichever doctor she wants to manage her blood pressure to be aware that they need to work to send in future refills

## 2020-10-10 NOTE — Telephone Encounter (Signed)
Please remind her that technically her kidney doctor should be the primary doctor refilling her blood pressure medications.  Even though I have refilled some of them, future refills should really go through kidney doctor for blood pressure medications.  Please ask her to talk with pharmacy about routing blood pressure medicine refills to kidney doctor.  Also, please call and get the latest office note to [place] from [facility]

## 2020-10-10 NOTE — Telephone Encounter (Signed)
Pt called and said [facility] pharmacy is canceling her Clonidine prescription because it needs authorization from you. She said they have it but it just needs to be authorized by you.

## 2020-10-10 NOTE — Telephone Encounter (Signed)
Pt advised and also stated that the kidney Dr. Damaris Schooner to you and put your name on the prescription that's why she has been asking you to fill it.

## 2020-10-11 DIAGNOSIS — N182 Chronic kidney disease, stage 2 (mild): Secondary | ICD-10-CM | POA: Diagnosis not present

## 2020-10-19 ENCOUNTER — Other Ambulatory Visit: Payer: Self-pay | Admitting: Medical

## 2020-10-19 ENCOUNTER — Telehealth: Payer: Self-pay | Admitting: Medical

## 2020-10-19 MED ORDER — LINACLOTIDE 72 MCG PO CAPS
72.0000 ug | ORAL_CAPSULE | Freq: Every day | ORAL | 5 refills | Status: DC
  Filled 2020-10-19: qty 30, 30d supply, fill #0

## 2020-10-19 NOTE — Telephone Encounter (Signed)
Pt called and states that the new medication, clonidine, is giving her constipation. She is happy with the results of medication and does not want to change, however she would like something to help her with this side effect. She has tried Senokot, colace and magnesium citrate OTC with no results. She is interested in trying Linzess. Please advise pt at [phone] and she uses [facility] on [address].

## 2020-10-19 NOTE — Telephone Encounter (Signed)
Linzess once daily sent to [facility]

## 2020-10-20 ENCOUNTER — Other Ambulatory Visit (HOSPITAL_COMMUNITY): Payer: Self-pay

## 2020-10-20 ENCOUNTER — Other Ambulatory Visit: Payer: Self-pay

## 2020-10-20 MED ORDER — LINACLOTIDE 72 MCG PO CAPS: 72.0000 ug | ORAL_CAPSULE | Freq: Every day | ORAL | 5 refills | Status: DC

## 2020-10-20 NOTE — Telephone Encounter (Signed)
Prescription switched to correct pharmacy and pt. Is aware.

## 2020-10-21 ENCOUNTER — Other Ambulatory Visit: Payer: Self-pay | Admitting: Medical

## 2020-10-26 ENCOUNTER — Telehealth: Payer: Self-pay | Admitting: Family Medicine

## 2020-10-26 NOTE — Telephone Encounter (Signed)
Pt called and wanted to have the EMMI calls stopped, she said she felt she was being harassed and that she was healing from a stroke and didn't want to have a mammogram.  I assured her we would have her name removed from the [facility] calls.  She went on to tell me that [facility] is requiring patients to do things and that they misdiagnosed her when she knew she was having  a stroke.  She said her bp was spike several times and she had to push [facility] for a CT scan that proved she had a stroke. Patient stated one of the doctors in the hospital told her it was because of the color of her skin is why she had to push.  I explained that [facility] cannot require they can only recommend.  I also reassured her that [facility] was working very hard to make sure everyone is being treated well no matter the color of our skin.  I told her I was glad she stood up for herself that everyone should stand up for themselves.    I reassured her the calls from [facility] and the My chart emails are coming from list and no personal intent.  She asked why did they not think she was a patient here.  The request is if you are no longer a patient here let us know, so that we may remove her from the list.  Our call ended well and I told her to call us anytime.

## 2020-10-26 NOTE — Telephone Encounter (Signed)
I am sending this to Tina Horton  At one of her last visit we referred to endocrinology.  I still do not see any recent endocrinology visit notes.  What is the status on her appointment with endocrinology?  [name]-I wonder if it is time to go ahead and part ways of this patient.  She has been difficult to work with over the last several years.  She has been known to do things on her own stopping or starting back on the medicine without notifying us or just not being very compliant at times.  I think she is a liability in general, and she has certainly made it difficult to feel like we are on the same page with the overall care when she does things that are either are not compliant or question our care for her  Similarly, her reply to these Emmi calls which are meant to help give patient's reminders and help them with their overall health, again reiterates that we are not in unison on her overall care.  This [name], if you are agreeable, I think it is time to send her a letter of dismissal to ask her to find a new provider as I do not think we are a good fit at this office

## 2020-10-27 ENCOUNTER — Telehealth: Payer: Self-pay | Admitting: Medical

## 2020-10-27 ENCOUNTER — Other Ambulatory Visit: Payer: Self-pay | Admitting: Medical

## 2020-10-27 NOTE — Telephone Encounter (Signed)
Please specify what the dosage should be or directions for Clonidine.

## 2020-10-27 NOTE — Telephone Encounter (Signed)
  Pt called about her Clonidine.1  She has been taking 2 in am and 2 in pm for a while  ( she believes Audelia Acton is the one that changed her dose)  Her rx at pharmacy still shows old dose

## 2020-10-30 ENCOUNTER — Encounter: Payer: Self-pay | Admitting: Medical

## 2020-10-30 ENCOUNTER — Other Ambulatory Visit: Payer: Self-pay | Admitting: Medical

## 2020-10-30 ENCOUNTER — Ambulatory Visit (INDEPENDENT_AMBULATORY_CARE_PROVIDER_SITE_OTHER): Payer: Medicare HMO | Admitting: Medical

## 2020-10-30 VITALS — BP 130/80 | HR 83 | Ht 61.0 in | Wt 127.2 lb

## 2020-10-30 DIAGNOSIS — E559 Vitamin D deficiency, unspecified: Secondary | ICD-10-CM | POA: Diagnosis not present

## 2020-10-30 DIAGNOSIS — IMO0002 Reserved for concepts with insufficient information to code with codable children: Secondary | ICD-10-CM

## 2020-10-30 DIAGNOSIS — Z2820 Immunization not carried out because of patient decision for unspecified reason: Secondary | ICD-10-CM

## 2020-10-30 DIAGNOSIS — E041 Nontoxic single thyroid nodule: Secondary | ICD-10-CM | POA: Diagnosis not present

## 2020-10-30 DIAGNOSIS — H539 Unspecified visual disturbance: Secondary | ICD-10-CM | POA: Diagnosis not present

## 2020-10-30 DIAGNOSIS — N281 Cyst of kidney, acquired: Secondary | ICD-10-CM

## 2020-10-30 DIAGNOSIS — Z1211 Encounter for screening for malignant neoplasm of colon: Secondary | ICD-10-CM

## 2020-10-30 DIAGNOSIS — I10 Essential (primary) hypertension: Secondary | ICD-10-CM

## 2020-10-30 DIAGNOSIS — Z0000 Encounter for general adult medical examination without abnormal findings: Secondary | ICD-10-CM

## 2020-10-30 DIAGNOSIS — N189 Chronic kidney disease, unspecified: Secondary | ICD-10-CM | POA: Diagnosis not present

## 2020-10-30 DIAGNOSIS — M25561 Pain in right knee: Secondary | ICD-10-CM

## 2020-10-30 DIAGNOSIS — E1165 Type 2 diabetes mellitus with hyperglycemia: Secondary | ICD-10-CM | POA: Diagnosis not present

## 2020-10-30 DIAGNOSIS — R7989 Other specified abnormal findings of blood chemistry: Secondary | ICD-10-CM | POA: Insufficient documentation

## 2020-10-30 DIAGNOSIS — Z129 Encounter for screening for malignant neoplasm, site unspecified: Secondary | ICD-10-CM

## 2020-10-30 DIAGNOSIS — Z90710 Acquired absence of both cervix and uterus: Secondary | ICD-10-CM | POA: Diagnosis not present

## 2020-10-30 DIAGNOSIS — N182 Chronic kidney disease, stage 2 (mild): Secondary | ICD-10-CM

## 2020-10-30 DIAGNOSIS — Z91199 Patient's noncompliance with other medical treatment and regimen due to unspecified reason: Secondary | ICD-10-CM

## 2020-10-30 DIAGNOSIS — Z9119 Patient's noncompliance with other medical treatment and regimen: Secondary | ICD-10-CM | POA: Diagnosis not present

## 2020-10-30 DIAGNOSIS — D631 Anemia in chronic kidney disease: Secondary | ICD-10-CM

## 2020-10-30 DIAGNOSIS — M47818 Spondylosis without myelopathy or radiculopathy, sacral and sacrococcygeal region: Secondary | ICD-10-CM

## 2020-10-30 DIAGNOSIS — K2100 Gastro-esophageal reflux disease with esophagitis, without bleeding: Secondary | ICD-10-CM

## 2020-10-30 DIAGNOSIS — Z8616 Personal history of COVID-19: Secondary | ICD-10-CM

## 2020-10-30 DIAGNOSIS — M170 Bilateral primary osteoarthritis of knee: Secondary | ICD-10-CM

## 2020-10-30 DIAGNOSIS — Z9189 Other specified personal risk factors, not elsewhere classified: Secondary | ICD-10-CM

## 2020-10-30 DIAGNOSIS — M48061 Spinal stenosis, lumbar region without neurogenic claudication: Secondary | ICD-10-CM

## 2020-10-30 DIAGNOSIS — M5136 Other intervertebral disc degeneration, lumbar region: Secondary | ICD-10-CM

## 2020-10-30 DIAGNOSIS — E782 Mixed hyperlipidemia: Secondary | ICD-10-CM

## 2020-10-30 DIAGNOSIS — Z794 Long term (current) use of insulin: Secondary | ICD-10-CM

## 2020-10-30 DIAGNOSIS — H409 Unspecified glaucoma: Secondary | ICD-10-CM

## 2020-10-30 DIAGNOSIS — Z8739 Personal history of other diseases of the musculoskeletal system and connective tissue: Secondary | ICD-10-CM

## 2020-10-30 DIAGNOSIS — E118 Type 2 diabetes mellitus with unspecified complications: Secondary | ICD-10-CM | POA: Diagnosis not present

## 2020-10-30 DIAGNOSIS — G8929 Other chronic pain: Secondary | ICD-10-CM

## 2020-10-30 DIAGNOSIS — M25562 Pain in left knee: Secondary | ICD-10-CM

## 2020-10-30 DIAGNOSIS — Z7189 Other specified counseling: Secondary | ICD-10-CM

## 2020-10-30 NOTE — Patient Instructions (Signed)
A personalized plan was printed today for your records and use.   Personalized health advice and education was given today to reduce health risks and promote self management and wellness.  Information regarding end of life planning was discussed today.    Cancer screening: We discussed breast cancer screening.  You currently decline mammograms at this time  You have had prior hysterectomy, so you do not need pap smears.  It is reasonable to have gynecologic exam periodically and routine breast exam by your gynecologist.  Colon cancer screening - you note that the Cologuard is sitting at the house, and you note that you currently are not planning to do this at the moment.    I recommend checking skin regularly for any new moles or changes.    Vaccines: We recommend a yearly flu shot.  Your tetanus vaccine is up to date.  You declines any other vaccines at this time.  You current decline Shingrix, Pneumococcal, and Covid vaccines.    Other recommendations and routine care: I recommend a yearly ophthalmology/optometry visit for glaucoma screening and eye checkup I recommended a yearly dental visit for hygiene and checkup Advanced directives - discussed nature and purpose of Advanced Directives, encouraged them to complete them if they have not done so and/or encouraged them to get [place] a copy if they have done this already.    Chronic conditions: High blood pressure This is a reminder, please make sure either your heart doctor or kidney doctor knows to refill your blood pressure medications.  Please make sure your pharmacy also knows that the refills should be through either your heart doctor kidney doctor for those medications Goal is less than 130/80 blood pressure readings at home Limit salt in your diet   High cholesterol Continue cholesterol medicine and aspirin daily Limit your meat intake, and avoid high cholesterol foods   Diabetes You report that your home readings  are at goal currently, looking good We are checking an updated labs today You are past due and establishing with endocrinology We have made referrals to multiple times this year.  If you have not heard back within 1 week from today's visit regarding an appointment time with endocrinology, please let us know    Chronic constipation Since you have never had a colonoscopy, I strongly recommend you see a gastroenterologist You are due for cancer screening and a colonoscopy makes more sense to lose blood stool test particularly with your chronic constipation issues If agreeable we will make a referral to gastroenterology instead of doing the Cologuard You can continue Linzess to help keep bowels soft and formed Continue to drink water such as 80-100 ounces daily, and get fiber in the diet regularly   Vitamin D deficiency You reported today that your kidney doctor stopped her vitamin D supplement   Chronic kidney disease Follow-up with your kidney specialist regularly, always asked him to send [place] a copy of the office note   Abnormal thyroid labs and thyroid nodule history I am rechecking your thyroid labs today You need to establish with an endocrinologist soon    Noncompliance In general we have care plan and recommendations on several things in the past few years He currently refuses vaccines and cancer screenings.  This is certainly her choice but not the recommendations we have given     Referrals today: We have made several attempts to refer you to diabetes specialist/Endocrinology.  We are calling about referral again today.  It is up to you  to call our office if you have not heard back about a referral within 1 week!

## 2020-10-30 NOTE — Progress Notes (Signed)
Subjective:    Tina Horton is a 55 y.o. female who presents for Preventative Services visit and chronic medical problems/med check visit.    Primary Care Provider Tysinger, Camelia Eng, PA-C here for primary care  Current Health Care Team: Dentist, Dr. Joannie Springs, ophthalmology Dr. Quay Burow, cardiology Dr. Basil Dess, orthopedics Dr. Noel Journey, nephrology Dr. Bo Merino, rheumatology Dr. Narda Amber, neurology Dr. Raynelle Bring, urology Dr. Jacqualyn Posey ,[facility], gynecology prior but establishing soon with Dr. Daily, [facility] gyn Endocrinology - despite multiple referrals in past several months, she still has not established with any endocrinologist.   Of note, gynecology, urology, nephrology and ophthalmology are not in the EMR, so we do not get regular office notes from those specialist unfortunately.   Medical Services you may have received from other than [facility] providers in the past year (date may be approximate) Nephrology   Exercise Current exercise habits: The patient does not participate in regular exercise at present.   Nutrition/Diet Current diet: in general, a "healthy" diet    Depression Screen Depression screen [facility] 2/9 10/30/2020  Decreased Interest 0  Down, Depressed, Hopeless 0  PHQ - 2 Score 0  Some recent data might be hidden    Activities of Daily Living Screen/Functional Status Survey Is the patient deaf or have difficulty hearing?: No Does the patient have difficulty seeing, even when wearing glasses/contacts?: Yes Does the patient have difficulty concentrating, remembering, or making decisions?: No Does the patient have difficulty walking or climbing stairs?: Yes Does the patient have difficulty dressing or bathing?: No Does the patient have difficulty doing errands alone such as visiting a doctor's office or shopping?: Yes  Can patient draw a clock face showing 3:15 o'clock, no  Fall Risk Screen Fall Risk  10/30/2020  08/03/2020 11/29/2019 05/27/2017 12/20/2015  Falls in the past year? 0 0 0 Yes No  Number falls in past yr: 0 0 - 1 -  Injury with Fall? 0 0 - - -  Risk for fall due to : Impaired vision Impaired mobility - Impaired balance/gait -  Follow up Falls evaluation completed - - - -    Gait Assessment: Normal gait observed yes  Advanced directives Does patient have a [place]? No Does patient have a Living Will? No  Past Medical History:  Diagnosis Date   Anemia    Arthritis    knees   Bronchitis    Diabetes mellitus without complication [facility])    age 59yo   GERD (gastroesophageal reflux disease)    Glaucoma    Dr. Einar Gip   H/O mammogram 2005   Headache    Heart murmur    Hyperlipidemia    Hypertension    Intermittent palpitations    Legally blind    Myopia    Pneumonia    Routine gynecological examination    last pap 2005   Seasonal allergies    Shortness of breath dyspnea    Sinusitis    Thyroid nodule     Past Surgical History:  Procedure Laterality Date   CYSTECTOMY     umbilicus   INCISION AND DRAINAGE     abdominal superficial abscess   LAPAROSCOPIC VAGINAL HYSTERECTOMY WITH SALPINGECTOMY Bilateral 09/13/2015   Procedure: LAPAROSCOPIC ASSISTED VAGINAL HYSTERECTOMY WITH SALPINGECTOMY, [facility]Calls Colpoplasty;  Surgeon: Thurnell Lose, MD;  Location: [facility];  Service: Gynecology;  Laterality: Bilateral;   LASIK     x2    Social History   Socioeconomic History  Marital status: Married    Spouse name: Not on file   Number of children: Not on file   Years of education: Not on file   Highest education level: Not on file  Occupational History   Not on file  Tobacco Use   Smoking status: Never   Smokeless tobacco: Never  Vaping Use   Vaping Use: Never used  Substance and Sexual Activity   Alcohol use: No    Alcohol/week: 0.0 standard drinks   Drug use: No   Sexual activity: Not on file  Other Topics Concern   Not on file  Social History  Narrative   Married, has 3 children, not exercising, but walks at work.     Lives in a one story home.     On disability.  Education: some college.      Update: No longer working, active in the home. Still on disability   Right handed    Social Determinants of Health   Financial Resource Strain: Not on file  Food Insecurity: Not on file  Transportation Needs: Not on file  Physical Activity: Not on file  Stress: Not on file  Social Connections: Not on file  Intimate Partner Violence: Not on file    Family History  Problem Relation Age of Onset   Diabetes Mother    Hypertension Mother    Stroke Mother    Hypertension Father    Chronic Renal Failure Father    Hypertension Sister    Hypertension Brother    Stroke Maternal Grandmother    Asthma Daughter    Healthy Daughter    Healthy Son    Cancer Neg Hx    Heart disease Neg Hx      Current Outpatient Medications:    Alcohol Swabs (B-D SINGLE USE SWABS REGULAR) PADS, USE AS DIRECTED WHEN TESTING BLOOD SUGAR, Disp: 300 each, Rfl: 0   aspirin EC 81 MG tablet, Take 1 tablet (81 mg total) by mouth daily., Disp: 90 tablet, Rfl: 3   atorvastatin (LIPITOR) 40 MG tablet, Take 40 mg by mouth daily., Disp: , Rfl:    Blood Glucose Monitoring Suppl (PRODIGY AUTOCODE BLOOD GLUCOSE) w/Device KIT, , Disp: , Rfl:    carvedilol (COREG) 25 MG tablet, Take 1 tablet (25 mg total) by mouth 2 (two) times daily., Disp: 180 tablet, Rfl: 3   cloNIDine (CATAPRES) 0.1 MG tablet, Take 1 tablet (0.1 mg total) by mouth 2 (two) times daily., Disp: 180 tablet, Rfl: 0   dorzolamide-timolol (COSOPT) 22.3-6.8 MG/ML ophthalmic solution, Place 1 drop into both eyes 2 (two) times daily., Disp: , Rfl:    glucose blood (PRODIGY NO CODING BLOOD GLUC) test strip, Use as instructed, Disp: 100 each, Rfl: 12   latanoprost (XALATAN) 0.005 % ophthalmic solution, Place 1 drop into both eyes at bedtime., Disp: , Rfl:    linaclotide (LINZESS) 72 MCG capsule, Take 1 capsule  (72 mcg total) by mouth daily before breakfast., Disp: 30 capsule, Rfl: 5   losartan (COZAAR) 100 MG tablet, Take 1 tablet (100 mg total) by mouth daily., Disp: 90 tablet, Rfl: 3   NOVOFINE PLUS PEN NEEDLE 32G X 4 MM MISC, USE 4 TIMES A DAY WITH INSULIN INJECTIONS, Disp: 100 each, Rfl: 0   NOVOLOG FLEXPEN 100 UNIT/ML FlexPen, INJECT 20 UNITS SUBCUTANEOUSLY THREE TIMES DAILY WITH MEALS (USE SLIDING SCALE PROVIDED BY MD)(PEN EXPIRES AT 28 DAYS) (Patient taking differently: Inject 16 Units into the skin 3 (three) times daily with meals.), Disp: 60 mL,  Rfl: 5   PRODIGY NO CODING BLOOD GLUC test strip, TEST BLOOD SUGAR THREE TIMES DAILY, Disp: 300 strip, Rfl: 0   Prodigy Twist Top Lancets 28G MISC, USE TO TEST BLOOD SUGAR THREE TIMES DAILY, Disp: 300 each, Rfl: 0   VITAMIN D-VITAMIN K PO, Take by mouth 2 (two) times a week., Disp: , Rfl:   Allergies  Allergen Reactions   Amlodipine Swelling    Swelling    Metformin And Related Diarrhea and Nausea And Vomiting   Tanzeum [Albiglutide] Nausea And Vomiting   Lisinopril Other (See Comments)    Refuses, says it causes cancer    History reviewed: allergies, current medications, past family history, past medical history, past social history, past surgical history and problem list  Chronic issues discussed: CKD - sees nephrology.  She is running out of clonidine . She notes after visit here in April where we added Clonidine 0.$RemoveBeforeDEI'1mg'KiWKYslXMXHoHuPr$  BID, she had called nephrology due to elevated BPs. She notes they told her to increase to 2 tablets BID but didn't send in new script.  Thus, she is running out of pills.    HTN - recent numbers include 120/70s, sometimes 130-140/80s in the mornings.  Highest number recently 150/80.  She notes compliance with Coreg and losartan.  She notes taking 2 clonidine BID.  Diabetes - recent glucose pre meal is 120-140s lately.  Novolog, using sliding scale, typically between 6-10 unites pre meal.  Hyperiodemia - she reports she is  taking aspirin and Lipitor daily  She notes Dr. Noel Journey took her off Vitamin D.  Acute issues discussed: Running out of clonidine.    Constipation - using enemas of late.  Using Linzess occasionally    Objective:     Biometrics BP 130/80   Pulse 83   Ht $R'5\' 1"'NL$  (1.549 m)   Wt 127 lb 3.2 oz (57.7 kg)   LMP 09/13/2015   SpO2 98%   BMI 24.03 kg/m   Wt Readings from Last 3 Encounters:  10/30/20 127 lb 3.2 oz (57.7 kg)  09/08/20 134 lb 6.4 oz (61 kg)  08/25/20 135 lb 6.4 oz (61.4 kg)   BP Readings from Last 3 Encounters:  10/30/20 130/80  09/08/20 122/75  08/31/20 122/70     Cognitive Testing  Alert? Yes  Normal Appearance?Yes  Oriented to person? Yes  Place? Yes   Time? No  Recall of three objects?  Yes  Can perform simple calculations? Yes  Displays appropriate judgment? Questionable at times  Can read the correct time from a watch face?No  General appearance: alert, no distress, WD/WN, African American female  Nutritional Status: Inadequate calore intake? no Loss of muscle mass? no Loss of fat beneath skin? no Localized or general edema? no Diminished functional status? yes  Other pertinent exam: HEENT: normocephalic, sclerae anicteric Neck: supple, no lymphadenopathy, no thyromegaly, no masses, no bruits Heart: RRR, normal S1, S2, no murmurs Lungs: CTA bilaterally, no wheezes, rhonchi, or rales Abdomen: +bs, soft, non tender, non distended, no masses, no hepatomegaly, no splenomegaly Musculoskeletal: nontender, no swelling, no obvious deformity Extremities: no edema, no cyanosis, no clubbing Pulses: 2+ symmetric, upper and lower extremities, normal cap refill Neurological: alert, oriented x 3, CN2-12 intact, strength normal upper extremities and lower extremities, sensation normal throughout, DTRs 2+ throughout, no cerebellar signs, gait normal Psychiatric: normal affect, behavior normal, pleasant   Breast/gyn - deferred to gynecology    Assessment:    Encounter Diagnoses  Name Primary?   Encounter for health maintenance  examination in adult Yes   Welcome to Medicare preventive visit    Uncontrolled diabetes mellitus with complication, with long-term current use of insulin (HCC)    Noncompliance    S/P laparoscopic assisted vaginal hysterectomy (LAVH)    Screen for colon cancer    Screening for cancer    Vaccine refused by patient    Vitamin D deficiency    VISUAL DISTURBANCE NOS    Anemia of renal disease    CKD (chronic kidney disease) stage 2, GFR 60-89 ml/min    Chronic pain of both knees    DDD (degenerative disc disease), lumbar    Difficulty agreeing with care plan    Essential hypertension    Glaucoma, unspecified glaucoma type, unspecified laterality    History of burning pain in leg    History of COVID-19    Mixed hyperlipidemia    Gastroesophageal reflux disease with esophagitis without hemorrhage    Primary osteoarthritis of both knees    Renal cyst    SI joint arthritis    Spinal stenosis of lumbar region, unspecified whether neurogenic claudication present    Advanced directives, counseling/discussion    Abnormal thyroid blood test    Thyroid nodule      Plan:   A preventative services visit was completed today.  During the course of the visit today, we discussed and counseled about appropriate screening and preventive services.  A health risk assessment was established today that included a review of current medications, allergies, social history, family history, medical and preventative health history, biometrics, and preventative screenings to identify potential safety concerns or impairments.  A personalized plan was printed today for your records and use.   Personalized health advice and education was given today to reduce health risks and promote self management and wellness.  Information regarding end of life planning was discussed today.    Cancer screening: We discussed breast cancer screening.   You currently decline mammograms at this time  You have had prior hysterectomy, so you do not need pap smears.  It is reasonable to have gynecologic exam periodically and routine breast exam by your gynecologist.  Colon cancer screening - you note that the Cologuard is sitting at the house, and you note that you currently are not planning to do this at the moment.    I recommend checking skin regularly for any new moles or changes.    Vaccines: We recommend a yearly flu shot.  Your tetanus vaccine is up to date.  You declines any other vaccines at this time.  You current decline Shingrix, Pneumococcal, and Covid vaccines.    Other recommendations and routine care: I recommend a yearly ophthalmology/optometry visit for glaucoma screening and eye checkup I recommended a yearly dental visit for hygiene and checkup Advanced directives - discussed nature and purpose of Advanced Directives, encouraged them to complete them if they have not done so and/or encouraged them to get [place] a copy if they have done this already.    Chronic conditions: High blood pressure This is a reminder, please make sure either your heart doctor or kidney doctor knows to refill your blood pressure medications.  Please make sure your pharmacy also knows that the refills should be through either your heart doctor kidney doctor for those medications Goal is less than 130/80 blood pressure readings at home Limit salt in your diet   High cholesterol Continue cholesterol medicine and aspirin daily Limit your meat intake, and avoid high cholesterol foods  Diabetes You report that your home readings are at goal currently, looking good We are checking an updated labs today You are past due and establishing with endocrinology We have made referrals to multiple times this year.  If you have not heard back within 1 week from today's visit regarding an appointment time with endocrinology, please let us  know    Chronic constipation Since you have never had a colonoscopy, I strongly recommend you see a gastroenterologist You are due for cancer screening and a colonoscopy makes more sense to lose blood stool test particularly with your chronic constipation issues If agreeable we will make a referral to gastroenterology instead of doing the Cologuard You can continue Linzess to help keep bowels soft and formed Continue to drink water such as 80-100 ounces daily, and get fiber in the diet regularly   Vitamin D deficiency You reported today that your kidney doctor stopped her vitamin D supplement   Chronic kidney disease Follow-up with your kidney specialist regularly, always asked him to send [place] a copy of the office note   Abnormal thyroid labs and thyroid nodule history I am rechecking your thyroid labs today You need to establish with an endocrinologist soon    Noncompliance In general we have care plan and recommendations on several things in the past few years He currently refuses vaccines and cancer screenings.  This is certainly her choice but not the recommendations we have given     Referrals today: We have made several attempts to refer you to diabetes specialist/Endocrinology.  We are calling about referral again today.  It is up to you to call our office if you have not heard back about a referral within 1 week!      Medicare Attestation A preventative services visit was completed today.  During the course of the visit the patient was educated and counseled about appropriate screening and preventive services.  A health risk assessment was established with the patient that included a review of current medications, allergies, social history, family history, medical and preventative health history, biometrics, and preventative screenings to identify potential safety concerns or impairments.  A personalized plan was printed today for the patient's records and use.    Personalized health advice and education was given today to reduce health risks and promote self management and wellness.  Information regarding end of life planning was discussed today.  Dorothea Ogle, PA-C   10/30/2020

## 2020-10-30 NOTE — Telephone Encounter (Signed)
Will discuss with patient when she comes in today.

## 2020-10-31 ENCOUNTER — Other Ambulatory Visit: Payer: Self-pay

## 2020-10-31 ENCOUNTER — Other Ambulatory Visit: Payer: Self-pay | Admitting: Medical

## 2020-10-31 ENCOUNTER — Telehealth: Payer: Self-pay | Admitting: Medical

## 2020-10-31 ENCOUNTER — Encounter: Payer: Self-pay | Admitting: Family Medicine

## 2020-10-31 DIAGNOSIS — E1165 Type 2 diabetes mellitus with hyperglycemia: Secondary | ICD-10-CM

## 2020-10-31 DIAGNOSIS — IMO0002 Reserved for concepts with insufficient information to code with codable children: Secondary | ICD-10-CM

## 2020-10-31 LAB — LIPID PANEL
Chol/HDL Ratio: 2.4 ratio (ref 0.0–4.4)
Cholesterol, Total: 117 mg/dL (ref 100–199)
HDL: 48 mg/dL (ref >39)
LDL Chol Calc (NIH): 58 mg/dL (ref 0–99)
Triglycerides: 45 mg/dL (ref 0–149)
VLDL Cholesterol Cal: 11 mg/dL (ref 5–40)

## 2020-10-31 LAB — RENAL FUNCTION PANEL
Albumin: 4.2 g/dL (ref 3.8–4.9)
BUN/Creatinine Ratio: 13 (ref 9–23)
BUN: 17 mg/dL (ref 6–24)
CO2: 24 mmol/L (ref 20–29)
Calcium: 10.2 mg/dL (ref 8.7–10.2)
Chloride: 103 mmol/L (ref 96–106)
Creatinine, Ser: 1.26 mg/dL — ABNORMAL HIGH (ref 0.57–1.00)
Glucose: 87 mg/dL (ref 65–99)
Phosphorus: 2.5 mg/dL — ABNORMAL LOW (ref 3.0–4.3)
Potassium: 3.7 mmol/L (ref 3.5–5.2)
Sodium: 142 mmol/L (ref 134–144)
eGFR: 50 mL/min/{1.73_m2} — ABNORMAL LOW (ref >59)

## 2020-10-31 LAB — HEMOGLOBIN A1C
Est. average glucose Bld gHb Est-mCnc: 186 mg/dL
Hgb A1c MFr Bld: 8.1 % — ABNORMAL HIGH (ref 4.8–5.6)

## 2020-10-31 LAB — T4, FREE: Free T4: 1.4 ng/dL (ref 0.82–1.77)

## 2020-10-31 LAB — TSH: TSH: 1.2 u[IU]/mL (ref 0.450–4.500)

## 2020-10-31 MED ORDER — CLONIDINE HCL 0.2 MG PO TABS: 0.2000 mg | ORAL_TABLET | Freq: Two times a day (BID) | ORAL | 0 refills | Status: DC

## 2020-10-31 NOTE — Telephone Encounter (Signed)
Pt called and needs a emergency dose of clondine sent to the [facility] states she does not have enough, And if you will send her monthly to the [facility] mail order

## 2020-11-01 ENCOUNTER — Telehealth: Payer: Self-pay | Admitting: Medical

## 2020-11-01 ENCOUNTER — Other Ambulatory Visit: Payer: Self-pay | Admitting: Medical

## 2020-11-01 MED ORDER — CLONIDINE HCL 0.2 MG PO TABS: 0.2000 mg | ORAL_TABLET | Freq: Two times a day (BID) | ORAL | 0 refills | Status: DC

## 2020-11-01 NOTE — Telephone Encounter (Signed)
Script has been updated and sent with correct directions.

## 2020-11-01 NOTE — Telephone Encounter (Signed)
I realize there was an error with the Clonidine script from phone call this week.  I do not see documentation on what the kidney doctor specifically noted when you called them, but my understanding was that she was on 0.1 mg and kidney doctor had increased her to 2 tablets twice daily.  However it appears that what was listed in epic afterwards and sent was 0.2 mg 2 tablets twice daily which is incorrect   I just changed this to the proper dose of 0.2 mg, 1 tablet twice daily which would be the same as (0.1 mg, 2 tablets twice daily).  Please call both the local and the mail order pharmacy to make sure they are aware of the correction.   The new dose should be 0.2 mg, 1 tablet twice daily.  I would just call and cancel the mail order dose since she is going to have a 90-day supply to local pharmacy at [facility].

## 2020-11-03 ENCOUNTER — Other Ambulatory Visit: Payer: Self-pay

## 2020-11-03 MED ORDER — CLONIDINE HCL 0.2 MG PO TABS: 0.2000 mg | ORAL_TABLET | Freq: Two times a day (BID) | ORAL | 3 refills | Status: DC

## 2020-11-04 ENCOUNTER — Other Ambulatory Visit: Payer: Self-pay | Admitting: Medical

## 2020-11-06 NOTE — Telephone Encounter (Signed)
Dismissal letter sent.

## 2020-11-08 ENCOUNTER — Ambulatory Visit: Payer: Medicare HMO | Admitting: Cardiovascular Disease

## 2020-11-08 ENCOUNTER — Encounter (HOSPITAL_COMMUNITY): Payer: Self-pay | Admitting: Emergency Medicine

## 2020-11-08 ENCOUNTER — Emergency Department (HOSPITAL_COMMUNITY): Payer: Medicare HMO

## 2020-11-08 ENCOUNTER — Emergency Department (HOSPITAL_COMMUNITY)
Admission: EM | Admit: 2020-11-08 | Discharge: 2020-11-09 | Disposition: A | Discharge status: Home / Self Care | Payer: Medicare HMO | Attending: Emergency Medicine | Admitting: Emergency Medicine

## 2020-11-08 ENCOUNTER — Other Ambulatory Visit: Payer: Self-pay

## 2020-11-08 DIAGNOSIS — Z8616 Personal history of COVID-19: Secondary | ICD-10-CM | POA: Diagnosis not present

## 2020-11-08 DIAGNOSIS — R109 Unspecified abdominal pain: Secondary | ICD-10-CM

## 2020-11-08 DIAGNOSIS — R7989 Other specified abnormal findings of blood chemistry: Secondary | ICD-10-CM | POA: Diagnosis not present

## 2020-11-08 DIAGNOSIS — I129 Hypertensive chronic kidney disease with stage 1 through stage 4 chronic kidney disease, or unspecified chronic kidney disease: Secondary | ICD-10-CM | POA: Insufficient documentation

## 2020-11-08 DIAGNOSIS — N182 Chronic kidney disease, stage 2 (mild): Secondary | ICD-10-CM | POA: Diagnosis not present

## 2020-11-08 DIAGNOSIS — Z79899 Other long term (current) drug therapy: Secondary | ICD-10-CM | POA: Diagnosis not present

## 2020-11-08 DIAGNOSIS — D649 Anemia, unspecified: Secondary | ICD-10-CM | POA: Insufficient documentation

## 2020-11-08 DIAGNOSIS — Z7982 Long term (current) use of aspirin: Secondary | ICD-10-CM | POA: Insufficient documentation

## 2020-11-08 DIAGNOSIS — N200 Calculus of kidney: Secondary | ICD-10-CM | POA: Diagnosis not present

## 2020-11-08 DIAGNOSIS — Z794 Long term (current) use of insulin: Secondary | ICD-10-CM | POA: Insufficient documentation

## 2020-11-08 DIAGNOSIS — E1122 Type 2 diabetes mellitus with diabetic chronic kidney disease: Secondary | ICD-10-CM | POA: Insufficient documentation

## 2020-11-08 DIAGNOSIS — N132 Hydronephrosis with renal and ureteral calculous obstruction: Secondary | ICD-10-CM | POA: Diagnosis not present

## 2020-11-08 DIAGNOSIS — N261 Atrophy of kidney (terminal): Secondary | ICD-10-CM | POA: Diagnosis not present

## 2020-11-08 LAB — CBC WITH DIFFERENTIAL/PLATELET
Abs Immature Granulocytes: 0.01 10*3/uL (ref 0.00–0.07)
Basophils Absolute: 0 10*3/uL (ref 0.0–0.1)
Basophils Relative: 0 %
Eosinophils Absolute: 0.2 10*3/uL (ref 0.0–0.5)
Eosinophils Relative: 3 %
HCT: 28.9 % — ABNORMAL LOW (ref 36.0–46.0)
Hemoglobin: 9.2 g/dL — ABNORMAL LOW (ref 12.0–15.0)
Immature Granulocytes: 0 %
Lymphocytes Relative: 33 %
Lymphs Abs: 2.1 10*3/uL (ref 0.7–4.0)
MCH: 29.9 pg (ref 26.0–34.0)
MCHC: 31.8 g/dL (ref 30.0–36.0)
MCV: 93.8 fL (ref 80.0–100.0)
Monocytes Absolute: 0.5 10*3/uL (ref 0.1–1.0)
Monocytes Relative: 8 %
Neutro Abs: 3.5 10*3/uL (ref 1.7–7.7)
Neutrophils Relative %: 56 %
Platelets: 255 10*3/uL (ref 150–400)
RBC: 3.08 MIL/uL — ABNORMAL LOW (ref 3.87–5.11)
RDW: 13.2 % (ref 11.5–15.5)
WBC: 6.3 10*3/uL (ref 4.0–10.5)
nRBC: 0 % (ref 0.0–0.2)

## 2020-11-08 LAB — URINALYSIS, ROUTINE W REFLEX MICROSCOPIC
Bilirubin Urine: NEGATIVE
Glucose, UA: 50 mg/dL — AB
Hgb urine dipstick: NEGATIVE
Ketones, ur: NEGATIVE mg/dL
Leukocytes,Ua: NEGATIVE
Nitrite: NEGATIVE
Protein, ur: NEGATIVE mg/dL
Specific Gravity, Urine: 1.002 — ABNORMAL LOW (ref 1.005–1.030)
pH: 6 (ref 5.0–8.0)

## 2020-11-08 LAB — COMPREHENSIVE METABOLIC PANEL
ALT: 19 U/L (ref 0–44)
AST: 16 U/L (ref 15–41)
Albumin: 4.2 g/dL (ref 3.5–5.0)
Alkaline Phosphatase: 62 U/L (ref 38–126)
Anion gap: 9 (ref 5–15)
BUN: 22 mg/dL — ABNORMAL HIGH (ref 6–20)
CO2: 28 mmol/L (ref 22–32)
Calcium: 9.9 mg/dL (ref 8.9–10.3)
Chloride: 103 mmol/L (ref 98–111)
Creatinine, Ser: 1.42 mg/dL — ABNORMAL HIGH (ref 0.44–1.00)
GFR, Estimated: 44 mL/min — ABNORMAL LOW (ref >60)
Glucose, Bld: 173 mg/dL — ABNORMAL HIGH (ref 70–99)
Potassium: 3.4 mmol/L — ABNORMAL LOW (ref 3.5–5.1)
Sodium: 140 mmol/L (ref 135–145)
Total Bilirubin: 1 mg/dL (ref 0.3–1.2)
Total Protein: 7.2 g/dL (ref 6.5–8.1)

## 2020-11-08 LAB — LIPASE, BLOOD: Lipase: 40 U/L (ref 11–51)

## 2020-11-08 NOTE — ED Provider Notes (Signed)
Emergency Medicine Provider Triage Evaluation Note  Tina Horton 55 y.o. female was evaluated in triage.  Pt complains of left flank pain that is been ongoing for last 2 to 3 days.  She reports she has a history of kidney problems and states she sees nephrologist.  She called her nephrologist and they recommended her coming to the emergency department.  She has had some nausea.  No vomiting.  No dysuria, hematuria.  No fevers.   Review of Systems  Positive: Left flank pain, nausea Negative: Fevers, vomiting  Physical Exam  BP 134/82   Pulse 70   Temp 98.2 F (36.8 C) (Oral)   Resp 18   Ht 5\' 4"  (1.626 m)   Wt 65.8 kg   SpO2 100%   BMI 24.89 kg/m  Gen:   Awake, no distress   HEENT:  Atraumatic  Resp:  Normal effort  Cardiac:  Normal rate  Abd:   Nondistended, nontender.  Left CVA tenderness. MSK:   Moves extremities without difficulty  Neuro:  Speech clear   Other:      Medical Decision Making  Medically screening exam initiated at 9:17 PM  Appropriate orders placed.  Karsten Ro was informed that the remainder of the evaluation will be completed by another provider, this initial triage assessment does not replace that evaluation. They are counseled that they will need to remain in the ED until the completion of their workup, including full H&P and results of any tests.  Risks of leaving the emergency department prior to completion of treatment were discussed. Patient was advised to inform ED staff if they are leaving before their treatment is complete. The patient acknowledged these risks and time was allowed for questions.     The patient appears stable so that the remainder of the MSE may be completed by another provider.    Clinical Impression  Flank pain   Portions of this note were generated with Dragon dictation software. Dictation errors may occur despite best attempts at proofreading.     Volanda Napoleon, PA-C 11/08/20 2117    Truddie Hidden, MD 11/08/20 2227

## 2020-11-08 NOTE — ED Triage Notes (Signed)
Pt arriving with complaint of left kidney pain. Pt has significant hx of kidney issues. Pt reports urinary frequency

## 2020-11-08 NOTE — ED Notes (Signed)
Pt taken to CT before v/s and triage could be completed. Once pt returned, v/s obtained as noted. Pt advises that she gave a urine sample already and that she gave it to the person who took her to CT. Urine sample is not found in triage area or triage room and has not been scanned in to the lab. Will check with CT tech regarding what happened to urine sample.

## 2020-11-09 ENCOUNTER — Encounter: Payer: Self-pay | Admitting: Family Medicine

## 2020-11-09 ENCOUNTER — Encounter (HOSPITAL_COMMUNITY): Payer: Self-pay | Admitting: Student

## 2020-11-09 ENCOUNTER — Other Ambulatory Visit: Payer: Self-pay | Admitting: Nephrology

## 2020-11-09 ENCOUNTER — Encounter: Payer: Self-pay | Admitting: Medical

## 2020-11-09 DIAGNOSIS — N182 Chronic kidney disease, stage 2 (mild): Secondary | ICD-10-CM

## 2020-11-09 DIAGNOSIS — R109 Unspecified abdominal pain: Secondary | ICD-10-CM

## 2020-11-09 DIAGNOSIS — N1330 Unspecified hydronephrosis: Secondary | ICD-10-CM | POA: Diagnosis not present

## 2020-11-09 DIAGNOSIS — N200 Calculus of kidney: Secondary | ICD-10-CM | POA: Diagnosis not present

## 2020-11-09 MED ORDER — ONDANSETRON 4 MG PO TBDP: 4.0000 mg | ORAL_TABLET | Freq: Three times a day (TID) | ORAL | 0 refills | Status: DC | PRN

## 2020-11-09 MED ORDER — SODIUM CHLORIDE 0.9 % IV BOLUS
1000.0000 mL | Freq: Once | INTRAVENOUS | Status: AC
  Administered 2020-11-09: 1000 mL via INTRAVENOUS

## 2020-11-09 MED ORDER — HYDROCODONE-ACETAMINOPHEN 5-325 MG PO TABS: 1.0000 | ORAL_TABLET | Freq: Four times a day (QID) | ORAL | 0 refills | Status: DC | PRN

## 2020-11-09 MED ORDER — MORPHINE SULFATE (PF) 4 MG/ML IV SOLN
4.0000 mg | Freq: Once | INTRAVENOUS | Status: AC
  Administered 2020-11-09: 4 mg via INTRAVENOUS
  Filled 2020-11-09: qty 1

## 2020-11-09 MED ORDER — ONDANSETRON HCL 4 MG/2ML IJ SOLN
4.0000 mg | Freq: Once | INTRAMUSCULAR | Status: AC
  Administered 2020-11-09: 4 mg via INTRAVENOUS
  Filled 2020-11-09: qty 2

## 2020-11-09 NOTE — ED Notes (Signed)
Patient asking for pain medication. Sam, PA notified.

## 2020-11-09 NOTE — Discharge Instructions (Addendum)
You were seen in the emergency department today for left-sided flank pain.  Your CT scan showed a stone within the kidney but no stones out in your ureter or bladder.  We suspect your pain may be due to spasms of your ureter.  Your creatinine which looks at your kidney function was elevated today, please be sure to have this rechecked by your primary care provider or nephrologist.  We are sending you home with the following medicines to help with your symptoms:  -Norco-this is a narcotic/controlled substance medication that has potential addicting qualities.  We recommend that you take 1 tablet every 6 hours as needed for severe pain.  Do not drive or operate heavy machinery when taking this medicine as it can be sedating. Do not drink alcohol or take other sedating medications when taking this medicine for safety reasons.  Keep this out of reach of small children.  Please be aware this medicine has Tylenol in it (325 mg/tab) do not exceed the maximum dose of Tylenol in a day per over the counter recommendations should you decide to supplement with Tylenol over the counter.   - Zofran- This is a medication to take for nausea- take every 8 hours as needed.   We have prescribed you new medication(s) today. Discuss the medications prescribed today with your pharmacist as they can have adverse effects and interactions with your other medicines including over the counter and prescribed medications. Seek medical evaluation if you start to experience new or abnormal symptoms after taking one of these medicines, seek care immediately if you start to experience difficulty breathing, feeling of your throat closing, facial swelling, or rash as these could be indications of a more serious allergic reaction   Please follow-up with your primary care provider within the next 3 days.  You may also follow-up with urology and/or nephrology.  Return to the ER for any new or worsening symptoms including but not limited to  new or worsening pain, fever, inability to keep fluids down, passing out, chest pain, trouble breathing, problems urinating, or any other concerns.

## 2020-11-09 NOTE — ED Provider Notes (Signed)
[facility] DEPT Provider Note   CSN: [id number] Arrival date & time: 11/08/20  2051     History Chief Complaint  Patient presents with   Flank Pain    Left    Tina Horton is a 55 y.o. female with a history of CKD stage 2, left hydronephrosis, ureteral stenosis, diabetes mellitus, prior stroke, & hyperlipidemia who presents to the ED with complaints of intermittent nonradiating left flank pain since yesterday. Pain fairly resolved at this time, was worse earlier shortly PTA. With associated nausea. When pain was worse she called her nephrologist who told her to come to the ED for evaluation. She denies fever, vomiting, dysuria, hematuria, diarrhea, melena, hematochezia, or vaginal bleeding/discharge. She has chronic constipation issues, no acute change.    HPI     Past Medical History:  Diagnosis Date   Anemia    Arthritis    knees   Bronchitis    Diabetes mellitus without complication [facility])    age 65yo   GERD (gastroesophageal reflux disease)    Glaucoma    Dr. Einar Gip   H/O mammogram 2005   Headache    Heart murmur    Hyperlipidemia    Hypertension    Intermittent palpitations    Legally blind    Myopia    Pneumonia    Routine gynecological examination    last pap 2005   Seasonal allergies    Shortness of breath dyspnea    Sinusitis    Thyroid nodule     Patient Active Problem List   Diagnosis Date Noted   Advanced directives, counseling/discussion 10/30/2020   Welcome to Medicare preventive visit 10/30/2020   Abnormal thyroid blood test 10/30/2020   Anemia of renal disease 08/22/2020   Difficulty agreeing with care plan 08/22/2020   History of recent stroke 08/07/2020   Screen for colon cancer 08/07/2020   Encounter for screening mammogram for malignant neoplasm of breast 08/07/2020   CVA (cerebral vascular accident) ([place]) 07/27/2020   History of COVID-19 05/22/2020   COVID-19 virus infection 02/25/2020    Chronic pain of both knees 01/04/2020   Ureteral stenosis 11/29/2019   CKD (chronic kidney disease) stage 2, GFR 60-89 ml/min 11/29/2019   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/13/2018   Vitamin D deficiency 03/13/2018   Primary osteoarthritis of both knees 01/20/2018   DDD (degenerative disc disease), lumbar 01/20/2018   Screening for cancer 08/20/2017   Renal cyst 01/15/2016   Hydronephrosis of left kidney 01/15/2016   Spinal stenosis 01/15/2016   History of burning pain in leg 12/04/2015   S/P laparoscopic assisted vaginal hysterectomy (LAVH) 09/13/2015   Encounter for health maintenance examination in adult 08/21/2015   Hyperlipidemia 08/21/2015   Glaucoma 08/21/2015   Heart murmur 08/21/2015   Thyroid nodule 08/21/2015   Vaccine refused by patient 08/21/2015   Noncompliance 03/21/2015   Essential hypertension 03/21/2015   Uncontrolled diabetes mellitus with complication, with long-term current use of insulin ([place]) 03/21/2015   VISUAL DISTURBANCE NOS 07/17/2006   REFLUX ESOPHAGITIS 07/17/2006    Past Surgical History:  Procedure Laterality Date   CYSTECTOMY     umbilicus   INCISION AND DRAINAGE     abdominal superficial abscess   LAPAROSCOPIC VAGINAL HYSTERECTOMY WITH SALPINGECTOMY Bilateral 09/13/2015   Procedure: LAPAROSCOPIC ASSISTED VAGINAL HYSTERECTOMY WITH SALPINGECTOMY, [facility] Colpoplasty;  Surgeon: Thurnell Lose, MD;  Location: [facility];  Service: Gynecology;  Laterality: Bilateral;   LASIK     x2     OB History  No obstetric history on file.     Family History  Problem Relation Age of Onset   Diabetes Mother    Hypertension Mother    Stroke Mother    Hypertension Father    Chronic Renal Failure Father    Hypertension Sister    Hypertension Brother    Stroke Maternal Grandmother    Asthma Daughter    Healthy Daughter    Healthy Son    Cancer Neg Hx    Heart disease Neg Hx     Social History   Tobacco Use   Smoking status: Never   Smokeless tobacco: Never   Vaping Use   Vaping Use: Never used  Substance Use Topics   Alcohol use: No    Alcohol/week: 0.0 standard drinks   Drug use: No    Home Medications Prior to Admission medications   Medication Sig Start Date End Date Taking? Authorizing Provider  Alcohol Swabs (B-D SINGLE USE SWABS REGULAR) PADS USE AS DIRECTED WHEN TESTING BLOOD SUGAR 10/30/20   Tysinger, Camelia Eng, PA-C  aspirin EC 81 MG tablet Take 1 tablet (81 mg total) by mouth daily. 11/30/19   Tysinger, Camelia Eng, PA-C  atorvastatin (LIPITOR) 40 MG tablet Take 40 mg by mouth daily.    [provider]  Blood Glucose Monitoring Suppl (PRODIGY AUTOCODE BLOOD GLUCOSE) w/Device KIT  12/07/19   [provider]  carvedilol (COREG) 25 MG tablet Take 1 tablet (25 mg total) by mouth 2 (two) times daily. 07/26/20   Lorretta Harp, MD  cloNIDine (CATAPRES) 0.2 MG tablet Take 1 tablet (0.2 mg total) by mouth 2 (two) times daily. 11/03/20 11/03/21  Lorretta Harp, MD  dorzolamide-timolol (COSOPT) 22.3-6.8 MG/ML ophthalmic solution Place 1 drop into both eyes 2 (two) times daily. 12/23/19 12/22/20  [provider]  glucose blood (PRODIGY NO CODING BLOOD GLUC) test strip Use as instructed 11/05/17   Tysinger, Camelia Eng, PA-C  latanoprost (XALATAN) 0.005 % ophthalmic solution Place 1 drop into both eyes at bedtime.    [provider]  linaclotide Rolan Lipa) 72 MCG capsule Take 1 capsule (72 mcg total) by mouth daily before breakfast. 10/20/20   Tysinger, Camelia Eng, PA-C  losartan (COZAAR) 100 MG tablet TAKE 1 TABLET (100 MG TOTAL) BY MOUTH DAILY. 11/06/20   Tysinger, Camelia Eng, PA-C  NOVOFINE PLUS PEN NEEDLE 32G X 4 MM MISC USE 4 TIMES A DAY WITH INSULIN INJECTIONS 08/21/20   Tysinger, Camelia Eng, PA-C  NOVOLOG FLEXPEN 100 UNIT/ML FlexPen INJECT 20 UNITS SUBCUTANEOUSLY THREE TIMES DAILY WITH MEALS (USE SLIDING SCALE PROVIDED BY MD)(PEN EXPIRES AT 28 DAYS) Patient taking differently: Inject 16 Units into the skin 3 (three) times daily with  meals. 03/31/20   Tysinger, Camelia Eng, PA-C  PRODIGY NO CODING BLOOD GLUC test strip TEST BLOOD SUGAR THREE TIMES DAILY 10/23/20   Tysinger, Camelia Eng, PA-C  Prodigy Twist Top Lancets 28G MISC USE TO TEST BLOOD SUGAR THREE TIMES DAILY 10/23/20   Tysinger, Camelia Eng, PA-C  VITAMIN D-VITAMIN K PO Take by mouth 2 (two) times a week.    [provider]    Allergies    Amlodipine, Metformin and related, Tanzeum [albiglutide], and Lisinopril  Review of Systems   Review of Systems  Constitutional:  Negative for chills and fever.  Respiratory:  Negative for shortness of breath.   Cardiovascular:  Negative for chest pain.  Gastrointestinal:  Positive for constipation (chronic unchanged) and nausea. Negative for abdominal pain and vomiting.  Genitourinary:  Positive for flank pain. Negative for dysuria, hematuria, vaginal bleeding and vaginal discharge.  All other systems reviewed and are negative.  Physical Exam Updated Vital Signs BP (!) 155/77   Pulse 76   Temp 98.3 F (36.8 C) (Oral)   Resp 15   LMP 09/13/2015   SpO2 100%   Physical Exam Vitals and nursing note reviewed.  Constitutional:      General: She is not in acute distress.    Appearance: She is well-developed. She is not toxic-appearing.  HENT:     Head: Normocephalic and atraumatic.  Eyes:     General:        Right eye: No discharge.        Left eye: No discharge.     Conjunctiva/sclera: Conjunctivae normal.  Cardiovascular:     Rate and Rhythm: Normal rate and regular rhythm.  Pulmonary:     Effort: Pulmonary effort is normal. No respiratory distress.     Breath sounds: Normal breath sounds. No wheezing, rhonchi or rales.  Abdominal:     General: There is no distension.     Palpations: Abdomen is soft.     Tenderness: There is no abdominal tenderness. There is no right CVA tenderness, left CVA tenderness, guarding or rebound.  Musculoskeletal:     Cervical back: Neck supple.  Skin:    General: Skin is warm and  dry.     Findings: No rash.  Neurological:     Mental Status: She is alert.     Comments: Clear speech.   Psychiatric:        Behavior: Behavior normal.    ED Results / Procedures / Treatments   Labs (all labs ordered are listed, but only abnormal results are displayed) Labs Reviewed  COMPREHENSIVE METABOLIC PANEL - Abnormal; Notable for the following components:      Result Value   Potassium 3.4 (*)    Glucose, Bld 173 (*)    BUN 22 (*)    Creatinine, Ser 1.42 (*)    GFR, Estimated 44 (*)    All other components within normal limits  CBC WITH DIFFERENTIAL/PLATELET - Abnormal; Notable for the following components:   RBC 3.08 (*)    Hemoglobin 9.2 (*)    HCT 28.9 (*)    All other components within normal limits  URINALYSIS, ROUTINE W REFLEX MICROSCOPIC - Abnormal; Notable for the following components:   Color, Urine COLORLESS (*)    Specific Gravity, Urine 1.002 (*)    Glucose, UA 50 (*)    All other components within normal limits  LIPASE, BLOOD    EKG None  Radiology CT Renal Stone Study  Result Date: 11/08/2020 CLINICAL DATA:  55 year old female with flank pain. Concern for kidney stone. EXAM: CT ABDOMEN AND PELVIS WITHOUT CONTRAST TECHNIQUE: Multidetector CT imaging of the abdomen and pelvis was performed following the standard protocol without IV contrast. COMPARISON:  None. FINDINGS: Evaluation of this exam is limited in the absence of intravenous contrast. Lower chest: The visualized lung bases are clear. No intra-abdominal free air or free fluid. Hepatobiliary: No focal liver abnormality is seen. No gallstones, gallbladder wall thickening, or biliary dilatation. Pancreas: Unremarkable. No pancreatic ductal dilatation or surrounding inflammatory changes. Spleen: Normal in size without focal abnormality. Adrenals/Urinary Tract: The adrenal glands unremarkable. Moderate left renal parenchyma atrophy. There is a 4 mm nonobstructing stone in the inferior pole of the left  kidney. There is mild left hydronephrosis. The right kidney is unremarkable. The visualized ureters and  urinary bladder appear unremarkable. Stomach/Bowel: There is no bowel obstruction or active inflammation. The appendix is normal. Vascular/Lymphatic: Mild aortoiliac atherosclerotic disease. The IVC is unremarkable. No portal venous gas. There is no adenopathy. Reproductive: Hysterectomy. No pelvic mass. Other: None Musculoskeletal: Degenerative changes with disc desiccation at L4-L5 and L5-S1. No acute osseous pathology. IMPRESSION: 1. A 4 mm nonobstructing left renal inferior pole stone. Moderate left renal parenchyma atrophy with mild left hydronephrosis. 2. No bowel obstruction. Normal appendix. 3. Aortic Atherosclerosis (ICD10-I70.0). Electronically Signed   By: Anner Crete M.D.   On: 11/08/2020 21:46    Procedures Procedures   Medications Ordered in ED Medications  sodium chloride 0.9 % bolus 1,000 mL (0 mLs Intravenous Stopped 11/09/20 0209)  morphine 4 MG/ML injection 4 mg (4 mg Intravenous Given 11/09/20 0209)  ondansetron (ZOFRAN) injection 4 mg (4 mg Intravenous Given 11/09/20 0209)    ED Course  I have reviewed the triage vital signs and the nursing notes.  Pertinent labs & imaging results that were available during my care of the patient were reviewed by me and considered in my medical decision making (see chart for details).    MDM Rules/Calculators/A&P                          Patient presents to the ED with complaints of left flank pain.  Nontoxic, BP elevated- doubt HTN emergency.  Exam benign.   Additional history obtained:  Additional history obtained from chart review & nursing note review.   Lab Tests:  I reviewed and interpreted labs, which included:  CBC: anemia worsened from prior- will need PCP recheck.  CMP: Worsening in creatinine 1.42 today, most recently 1.26, mild hypokalemia & hyperglycemia Lipase: WNL UA: No UTI.  Imaging Studies ordered:  CT  renal stone study ordered prior to my evaluation, I independently reviewed, formal radiology impression shows:  1. A 4 mm nonobstructing left renal inferior pole stone. Moderate left renal parenchyma atrophy with mild left hydronephrosis. 2. No bowel obstruction. Normal appendix. 3. Aortic Atherosclerosis   ED Course:  Patient did have some pain while receiving fluids, improved with analgesics in the ED. Unclear definitive etiology to her pain, CT renal stone study without appendicitis, obstruction, or acute masses. Serial abdominal exams are nontender without peritoneal signs. Left sided intra renal stone, but no ureteral stones, possibly ureteral spasm. Received fluids for her mild increase in creatinine. Overall appears appropriate for discharge home with analgesics/anti-emetics and PCP/nephrology/urology follow up. I discussed results, treatment plan, need for follow-up, and return precautions with the patient. Provided opportunity for questions, patient confirmed understanding and is in agreement with plan.    Findings and plan of care discussed with supervising physician Dr. Florina Ou who has provided guidance & is in agreement.   [place] Controlled Substance reporting System queried  Portions of this note were generated with [employer]. Dictation errors may occur despite best attempts at proofreading.  Final Clinical Impression(s) / ED Diagnoses Final diagnoses:  Left flank pain  Renal stone  Elevated serum creatinine    Rx / DC Orders ED Discharge Orders          Ordered    HYDROcodone-acetaminophen (NORCO/VICODIN) 5-325 MG tablet  Every 6 hours PRN        11/09/20 0334    ondansetron (ZOFRAN ODT) 4 MG disintegrating tablet  Every 8 hours PRN        11/09/20 0334  Leafy Kindle 11/09/20 0349    Shanon Rosser, MD 11/09/20 [phone]

## 2020-11-14 ENCOUNTER — Other Ambulatory Visit (HOSPITAL_COMMUNITY): Payer: Self-pay | Admitting: Urology

## 2020-11-14 DIAGNOSIS — N189 Chronic kidney disease, unspecified: Secondary | ICD-10-CM | POA: Diagnosis not present

## 2020-11-14 DIAGNOSIS — U099 Post covid-19 condition, unspecified: Secondary | ICD-10-CM | POA: Diagnosis not present

## 2020-11-14 DIAGNOSIS — I10 Essential (primary) hypertension: Secondary | ICD-10-CM | POA: Diagnosis not present

## 2020-11-14 DIAGNOSIS — R634 Abnormal weight loss: Secondary | ICD-10-CM | POA: Diagnosis not present

## 2020-11-14 DIAGNOSIS — E1169 Type 2 diabetes mellitus with other specified complication: Secondary | ICD-10-CM | POA: Diagnosis not present

## 2020-11-14 DIAGNOSIS — N1330 Unspecified hydronephrosis: Secondary | ICD-10-CM

## 2020-11-14 DIAGNOSIS — N184 Chronic kidney disease, stage 4 (severe): Secondary | ICD-10-CM | POA: Diagnosis not present

## 2020-11-14 DIAGNOSIS — F32A Depression, unspecified: Secondary | ICD-10-CM | POA: Diagnosis not present

## 2020-11-14 DIAGNOSIS — D649 Anemia, unspecified: Secondary | ICD-10-CM | POA: Diagnosis not present

## 2020-11-14 DIAGNOSIS — F064 Anxiety disorder due to known physiological condition: Secondary | ICD-10-CM | POA: Diagnosis not present

## 2020-11-14 DIAGNOSIS — F321 Major depressive disorder, single episode, moderate: Secondary | ICD-10-CM | POA: Diagnosis not present

## 2020-11-14 DIAGNOSIS — D6489 Other specified anemias: Secondary | ICD-10-CM | POA: Diagnosis not present

## 2020-11-14 DIAGNOSIS — H26239 Glaucomatous flecks (subcapsular), unspecified eye: Secondary | ICD-10-CM | POA: Diagnosis not present

## 2020-11-15 ENCOUNTER — Encounter: Payer: Self-pay | Admitting: Family Medicine

## 2020-11-15 ENCOUNTER — Encounter: Payer: Self-pay | Admitting: Medical

## 2020-11-15 DIAGNOSIS — Z6823 Body mass index (BMI) 23.0-23.9, adult: Secondary | ICD-10-CM | POA: Diagnosis not present

## 2020-11-15 DIAGNOSIS — H539 Unspecified visual disturbance: Secondary | ICD-10-CM | POA: Diagnosis not present

## 2020-11-15 DIAGNOSIS — E139 Other specified diabetes mellitus without complications: Secondary | ICD-10-CM | POA: Diagnosis not present

## 2020-11-15 DIAGNOSIS — Z8673 Personal history of transient ischemic attack (TIA), and cerebral infarction without residual deficits: Secondary | ICD-10-CM | POA: Diagnosis not present

## 2020-11-22 DIAGNOSIS — E782 Mixed hyperlipidemia: Secondary | ICD-10-CM | POA: Diagnosis not present

## 2020-11-22 DIAGNOSIS — N189 Chronic kidney disease, unspecified: Secondary | ICD-10-CM | POA: Diagnosis not present

## 2020-11-22 DIAGNOSIS — N182 Chronic kidney disease, stage 2 (mild): Secondary | ICD-10-CM | POA: Diagnosis not present

## 2020-11-22 LAB — HEPATIC FUNCTION PANEL
ALT: 13 IU/L (ref 0–32)
AST: 11 IU/L (ref 0–40)
Albumin: 4.6 g/dL (ref 3.8–4.9)
Alkaline Phosphatase: 79 IU/L (ref 44–121)
Bilirubin Total: 0.6 mg/dL (ref 0.0–1.2)
Bilirubin, Direct: 0.18 mg/dL (ref 0.00–0.40)
Total Protein: 7 g/dL (ref 6.0–8.5)

## 2020-11-22 LAB — LIPID PANEL
Chol/HDL Ratio: 2.6 ratio (ref 0.0–4.4)
Cholesterol, Total: 131 mg/dL (ref 100–199)
HDL: 51 mg/dL (ref >39)
LDL Chol Calc (NIH): 64 mg/dL (ref 0–99)
Triglycerides: 79 mg/dL (ref 0–149)
VLDL Cholesterol Cal: 16 mg/dL (ref 5–40)

## 2020-11-25 ENCOUNTER — Other Ambulatory Visit: Payer: Self-pay

## 2020-11-25 ENCOUNTER — Emergency Department (HOSPITAL_COMMUNITY)
Admission: EM | Admit: 2020-11-25 | Discharge: 2020-11-25 | Disposition: A | Discharge status: Home / Self Care | Payer: Medicare HMO | Attending: Emergency Medicine | Admitting: Emergency Medicine

## 2020-11-25 ENCOUNTER — Emergency Department (HOSPITAL_COMMUNITY): Payer: Medicare HMO

## 2020-11-25 ENCOUNTER — Encounter (HOSPITAL_COMMUNITY): Payer: Self-pay | Admitting: *Deleted

## 2020-11-25 DIAGNOSIS — I129 Hypertensive chronic kidney disease with stage 1 through stage 4 chronic kidney disease, or unspecified chronic kidney disease: Secondary | ICD-10-CM | POA: Diagnosis not present

## 2020-11-25 DIAGNOSIS — R109 Unspecified abdominal pain: Secondary | ICD-10-CM | POA: Diagnosis present

## 2020-11-25 DIAGNOSIS — Z7982 Long term (current) use of aspirin: Secondary | ICD-10-CM | POA: Insufficient documentation

## 2020-11-25 DIAGNOSIS — N182 Chronic kidney disease, stage 2 (mild): Secondary | ICD-10-CM | POA: Insufficient documentation

## 2020-11-25 DIAGNOSIS — Z79899 Other long term (current) drug therapy: Secondary | ICD-10-CM | POA: Insufficient documentation

## 2020-11-25 DIAGNOSIS — Z8616 Personal history of COVID-19: Secondary | ICD-10-CM | POA: Diagnosis not present

## 2020-11-25 DIAGNOSIS — N261 Atrophy of kidney (terminal): Secondary | ICD-10-CM | POA: Diagnosis not present

## 2020-11-25 DIAGNOSIS — Z794 Long term (current) use of insulin: Secondary | ICD-10-CM | POA: Diagnosis not present

## 2020-11-25 DIAGNOSIS — N200 Calculus of kidney: Secondary | ICD-10-CM | POA: Diagnosis not present

## 2020-11-25 DIAGNOSIS — K5900 Constipation, unspecified: Secondary | ICD-10-CM | POA: Diagnosis not present

## 2020-11-25 DIAGNOSIS — R188 Other ascites: Secondary | ICD-10-CM | POA: Diagnosis not present

## 2020-11-25 DIAGNOSIS — E1122 Type 2 diabetes mellitus with diabetic chronic kidney disease: Secondary | ICD-10-CM | POA: Diagnosis not present

## 2020-11-25 DIAGNOSIS — K219 Gastro-esophageal reflux disease without esophagitis: Secondary | ICD-10-CM | POA: Diagnosis not present

## 2020-11-25 DIAGNOSIS — I700 Atherosclerosis of aorta: Secondary | ICD-10-CM | POA: Diagnosis not present

## 2020-11-25 LAB — BASIC METABOLIC PANEL
Anion gap: 10 (ref 5–15)
BUN: 18 mg/dL (ref 6–20)
CO2: 26 mmol/L (ref 22–32)
Calcium: 10 mg/dL (ref 8.9–10.3)
Chloride: 105 mmol/L (ref 98–111)
Creatinine, Ser: 0.95 mg/dL (ref 0.44–1.00)
GFR, Estimated: >60 mL/min (ref >60)
Glucose, Bld: 140 mg/dL — ABNORMAL HIGH (ref 70–99)
Potassium: 3.3 mmol/L — ABNORMAL LOW (ref 3.5–5.1)
Sodium: 141 mmol/L (ref 135–145)

## 2020-11-25 LAB — CBC WITH DIFFERENTIAL/PLATELET
Abs Immature Granulocytes: 0.02 10*3/uL (ref 0.00–0.07)
Basophils Absolute: 0 10*3/uL (ref 0.0–0.1)
Basophils Relative: 1 %
Eosinophils Absolute: 0.3 10*3/uL (ref 0.0–0.5)
Eosinophils Relative: 3 %
HCT: 32.7 % — ABNORMAL LOW (ref 36.0–46.0)
Hemoglobin: 10.5 g/dL — ABNORMAL LOW (ref 12.0–15.0)
Immature Granulocytes: 0 %
Lymphocytes Relative: 23 %
Lymphs Abs: 1.8 10*3/uL (ref 0.7–4.0)
MCH: 29.6 pg (ref 26.0–34.0)
MCHC: 32.1 g/dL (ref 30.0–36.0)
MCV: 92.1 fL (ref 80.0–100.0)
Monocytes Absolute: 0.5 10*3/uL (ref 0.1–1.0)
Monocytes Relative: 6 %
Neutro Abs: 5.4 10*3/uL (ref 1.7–7.7)
Neutrophils Relative %: 67 %
Platelets: 283 10*3/uL (ref 150–400)
RBC: 3.55 MIL/uL — ABNORMAL LOW (ref 3.87–5.11)
RDW: 12.9 % (ref 11.5–15.5)
WBC: 7.9 10*3/uL (ref 4.0–10.5)
nRBC: 0 % (ref 0.0–0.2)

## 2020-11-25 LAB — URINALYSIS, ROUTINE W REFLEX MICROSCOPIC
Bilirubin Urine: NEGATIVE
Glucose, UA: 50 mg/dL — AB
Hgb urine dipstick: NEGATIVE
Ketones, ur: NEGATIVE mg/dL
Leukocytes,Ua: NEGATIVE
Nitrite: NEGATIVE
Protein, ur: NEGATIVE mg/dL
Specific Gravity, Urine: 1.006 (ref 1.005–1.030)
pH: 6 (ref 5.0–8.0)

## 2020-11-25 NOTE — ED Provider Notes (Signed)
Emergency Medicine Provider Triage Evaluation Note  Tina Horton , a 55 y.o. female  was evaluated in triage.  Pt complains of flank plan   Review of Systems  Positive: Flank pan Negative: fever  Physical Exam  BP (!) 181/93 (BP Location: Left Arm)   Pulse 80   Temp 98.5 F (36.9 C) (Oral)   Resp 18   LMP 09/13/2015   SpO2 98%  Gen:   Awake, no distress   Resp:  Normal effort  MSK:   Moves extremities without difficulty  Other:    Medical Decision Making  Medically screening exam initiated at 7:28 PM.  Appropriate orders placed.  Audi Conover was informed that the remainder of the evaluation will be completed by another provider, this initial triage assessment does not replace that evaluation, and the importance of remaining in the ED until their evaluation is complete.     Sidney Ace 11/25/20 1929    Lacretia Leigh, MD 11/26/20 1919

## 2020-11-25 NOTE — ED Triage Notes (Signed)
Pt complains of left flank pain. She has hx of kidney stones and has one kidney. She called nephrologist, recommended she come to ED.

## 2020-11-25 NOTE — ED Provider Notes (Signed)
[facility] DEPT Provider Note   CSN: [id number] Arrival date & time: 11/25/20  1846     History Chief Complaint  Patient presents with   Flank Pain    Tina Horton is a 55 y.o. female.  55 year old female presents with left flank pain x1 day.  States it feels like she has a pulsatile area at her left flank.  States that she has a history of kidney stones and this feels slightly similar.  Denies any hematuria.  No fever or vomiting.  No treatment use prior to arrival      Past Medical History:  Diagnosis Date   Anemia    Arthritis    knees   Bronchitis    Diabetes mellitus without complication [facility])    age 87yo   GERD (gastroesophageal reflux disease)    Glaucoma    Dr. Einar Gip   H/O mammogram 2005   Headache    Heart murmur    Hyperlipidemia    Hypertension    Intermittent palpitations    Legally blind    Myopia    Pneumonia    Routine gynecological examination    last pap 2005   Seasonal allergies    Shortness of breath dyspnea    Sinusitis    Thyroid nodule     Patient Active Problem List   Diagnosis Date Noted   Advanced directives, counseling/discussion 10/30/2020   Welcome to Medicare preventive visit 10/30/2020   Abnormal thyroid blood test 10/30/2020   Anemia of renal disease 08/22/2020   Difficulty agreeing with care plan 08/22/2020   History of recent stroke 08/07/2020   Screen for colon cancer 08/07/2020   Encounter for screening mammogram for malignant neoplasm of breast 08/07/2020   CVA (cerebral vascular accident) ([place]) 07/27/2020   History of COVID-19 05/22/2020   COVID-19 virus infection 02/25/2020   Chronic pain of both knees 01/04/2020   Ureteral stenosis 11/29/2019   CKD (chronic kidney disease) stage 2, GFR 60-89 ml/min 11/29/2019   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/13/2018   Vitamin D deficiency 03/13/2018   Primary osteoarthritis of both knees 01/20/2018   DDD (degenerative disc disease), lumbar  01/20/2018   Screening for cancer 08/20/2017   Renal cyst 01/15/2016   Hydronephrosis of left kidney 01/15/2016   Spinal stenosis 01/15/2016   History of burning pain in leg 12/04/2015   S/P laparoscopic assisted vaginal hysterectomy (LAVH) 09/13/2015   Encounter for health maintenance examination in adult 08/21/2015   Hyperlipidemia 08/21/2015   Glaucoma 08/21/2015   Heart murmur 08/21/2015   Thyroid nodule 08/21/2015   Vaccine refused by patient 08/21/2015   Noncompliance 03/21/2015   Essential hypertension 03/21/2015   Uncontrolled diabetes mellitus with complication, with long-term current use of insulin ([place]) 03/21/2015   VISUAL DISTURBANCE NOS 07/17/2006   REFLUX ESOPHAGITIS 07/17/2006    Past Surgical History:  Procedure Laterality Date   CYSTECTOMY     umbilicus   INCISION AND DRAINAGE     abdominal superficial abscess   LAPAROSCOPIC VAGINAL HYSTERECTOMY WITH SALPINGECTOMY Bilateral 09/13/2015   Procedure: LAPAROSCOPIC ASSISTED VAGINAL HYSTERECTOMY WITH SALPINGECTOMY, [place]s Colpoplasty;  Surgeon: Thurnell Lose, MD;  Location: [facility];  Service: Gynecology;  Laterality: Bilateral;   LASIK     x2     OB History   No obstetric history on file.     Family History  Problem Relation Age of Onset   Diabetes Mother    Hypertension Mother    Stroke Mother    Hypertension  Father    Chronic Renal Failure Father    Hypertension Sister    Hypertension Brother    Stroke Maternal Grandmother    Asthma Daughter    Healthy Daughter    Healthy Son    Cancer Neg Hx    Heart disease Neg Hx     Social History   Tobacco Use   Smoking status: Never   Smokeless tobacco: Never  Vaping Use   Vaping Use: Never used  Substance Use Topics   Alcohol use: No    Alcohol/week: 0.0 standard drinks   Drug use: No    Home Medications Prior to Admission medications   Medication Sig Start Date End Date Taking? Authorizing Provider  Alcohol Swabs (B-D SINGLE USE SWABS REGULAR)  PADS USE AS DIRECTED WHEN TESTING BLOOD SUGAR 10/30/20   Tysinger, Camelia Eng, PA-C  aspirin EC 81 MG tablet Take 1 tablet (81 mg total) by mouth daily. 11/30/19   Tysinger, Camelia Eng, PA-C  atorvastatin (LIPITOR) 40 MG tablet Take 40 mg by mouth daily.    [provider]  Blood Glucose Monitoring Suppl (PRODIGY AUTOCODE BLOOD GLUCOSE) w/Device KIT  12/07/19   [provider]  carvedilol (COREG) 25 MG tablet Take 1 tablet (25 mg total) by mouth 2 (two) times daily. 07/26/20   Lorretta Harp, MD  cloNIDine (CATAPRES) 0.2 MG tablet Take 1 tablet (0.2 mg total) by mouth 2 (two) times daily. 11/03/20 11/03/21  Lorretta Harp, MD  dorzolamide-timolol (COSOPT) 22.3-6.8 MG/ML ophthalmic solution Place 1 drop into both eyes 2 (two) times daily. 12/23/19 12/22/20  [provider]  glucose blood (PRODIGY NO CODING BLOOD GLUC) test strip Use as instructed 11/05/17   Tysinger, Camelia Eng, PA-C  HYDROcodone-acetaminophen (NORCO/VICODIN) 5-325 MG tablet Take 1 tablet by mouth every 6 (six) hours as needed for severe pain. 11/09/20   Petrucelli, Samantha R, PA-C  latanoprost (XALATAN) 0.005 % ophthalmic solution Place 1 drop into both eyes at bedtime.    [provider]  linaclotide Rolan Lipa) 72 MCG capsule Take 1 capsule (72 mcg total) by mouth daily before breakfast. 10/20/20   Tysinger, Camelia Eng, PA-C  losartan (COZAAR) 100 MG tablet TAKE 1 TABLET (100 MG TOTAL) BY MOUTH DAILY. 11/06/20   Tysinger, Camelia Eng, PA-C  NOVOFINE PLUS PEN NEEDLE 32G X 4 MM MISC USE 4 TIMES A DAY WITH INSULIN INJECTIONS 08/21/20   Tysinger, Camelia Eng, PA-C  NOVOLOG FLEXPEN 100 UNIT/ML FlexPen INJECT 20 UNITS SUBCUTANEOUSLY THREE TIMES DAILY WITH MEALS (USE SLIDING SCALE PROVIDED BY MD)(PEN EXPIRES AT 28 DAYS) Patient taking differently: Inject 16 Units into the skin 3 (three) times daily with meals. 03/31/20   Tysinger, Camelia Eng, PA-C  ondansetron (ZOFRAN ODT) 4 MG disintegrating tablet Take 1 tablet (4 mg total) by mouth  every 8 (eight) hours as needed for nausea or vomiting. 11/09/20   Petrucelli, Samantha R, PA-C  PRODIGY NO CODING BLOOD GLUC test strip TEST BLOOD SUGAR THREE TIMES DAILY 10/23/20   Tysinger, Camelia Eng, PA-C  Prodigy Twist Top Lancets 28G MISC USE TO TEST BLOOD SUGAR THREE TIMES DAILY 10/23/20   Tysinger, Camelia Eng, PA-C  VITAMIN D-VITAMIN K PO Take by mouth 2 (two) times a week.    [provider]    Allergies    Amlodipine, Metformin and related, Tanzeum [albiglutide], and Lisinopril  Review of Systems   Review of Systems  All other systems reviewed and are negative.  Physical Exam Updated Vital Signs BP (!) 171/75 (  BP Location: Right Arm)   Pulse 75   Temp 98.5 F (36.9 C) (Oral)   Resp 15   LMP 09/13/2015   SpO2 100%   Physical Exam Vitals and nursing note reviewed.  Constitutional:      General: She is not in acute distress.    Appearance: Normal appearance. She is well-developed. She is not toxic-appearing.  HENT:     Head: Normocephalic and atraumatic.  Eyes:     General: Lids are normal.     Conjunctiva/sclera: Conjunctivae normal.     Pupils: Pupils are equal, round, and reactive to light.  Neck:     Thyroid: No thyroid mass.     Trachea: No tracheal deviation.  Cardiovascular:     Rate and Rhythm: Normal rate and regular rhythm.     Heart sounds: Normal heart sounds. No murmur heard.   No gallop.  Pulmonary:     Effort: Pulmonary effort is normal. No respiratory distress.     Breath sounds: Normal breath sounds. No stridor. No decreased breath sounds, wheezing, rhonchi or rales.  Abdominal:     General: There is no distension.     Palpations: Abdomen is soft.     Tenderness: There is no abdominal tenderness. There is no rebound.  Musculoskeletal:        General: No tenderness. Normal range of motion.     Cervical back: Normal range of motion and neck supple.  Skin:    General: Skin is warm and dry.     Findings: No abrasion or rash.  Neurological:      Mental Status: She is alert and oriented to person, place, and time. Mental status is at baseline.     GCS: GCS eye subscore is 4. GCS verbal subscore is 5. GCS motor subscore is 6.     Cranial Nerves: Cranial nerves are intact. No cranial nerve deficit.     Sensory: No sensory deficit.     Motor: Motor function is intact.  Psychiatric:        Attention and Perception: Attention normal.        Speech: Speech normal.        Behavior: Behavior normal.    ED Results / Procedures / Treatments   Labs (all labs ordered are listed, but only abnormal results are displayed) Labs Reviewed  URINE CULTURE  URINALYSIS, ROUTINE W REFLEX MICROSCOPIC  CBC WITH DIFFERENTIAL/PLATELET  BASIC METABOLIC PANEL    EKG None  Radiology CT Renal Stone Study  Result Date: 11/25/2020 CLINICAL DATA:  55 year old female with left-sided flank pain. Concern for kidney stone. EXAM: CT ABDOMEN AND PELVIS WITHOUT CONTRAST TECHNIQUE: Multidetector CT imaging of the abdomen and pelvis was performed following the standard protocol without IV contrast. COMPARISON:  CT abdomen pelvis dated 11/08/2020. FINDINGS: Evaluation of this exam is limited in the absence of intravenous contrast. Lower chest: Trace bilateral pleural effusions. The visualized lung bases are otherwise clear. No intra-abdominal free air. Small ascites. Hepatobiliary: No focal liver abnormality is seen. No gallstones, gallbladder wall thickening, or biliary dilatation. Pancreas: Unremarkable. No pancreatic ductal dilatation or surrounding inflammatory changes. Spleen: Normal in size without focal abnormality. Adrenals/Urinary Tract: The adrenal glands are unremarkable. There is a moderate left renal parenchyma atrophy. A nonobstructing 4 mm stone is again noted in the inferior pole of the left kidney similar to prior CT. There is mild left hydronephrosis or caliectasis similar to prior CT. No obstructing stone. The right kidney is unremarkable. The visualized  ureters  and the urinary bladder are unremarkable. Stomach/Bowel: There is large amount of stool throughout the colon. There is no bowel obstruction or active inflammation. The appendix is normal. Vascular/Lymphatic: Mild aortoiliac atherosclerotic disease. The IVC is unremarkable. No portal venous gas. There is no adenopathy. Reproductive: Hysterectomy. No adnexal masses. Other: Mild diffuse subcutaneous edema. Musculoskeletal: Degenerative changes of the spine. No acute osseous pathology. IMPRESSION: 1. No acute intra-abdominal or pelvic pathology. 2. Moderate left renal parenchyma atrophy with mild left hydronephrosis or caliectasis similar to prior CT. No obstructing stone. 3. A 4 mm nonobstructing left renal inferior pole stone similar to prior CT. 4. Constipation. No bowel obstruction. Normal appendix. 5. Trace bilateral pleural effusions, small ascites, and mild diffuse subcutaneous edema. 6. Aortic Atherosclerosis (ICD10-I70.0). Electronically Signed   By: Anner Crete M.D.   On: 11/25/2020 20:08    Procedures Procedures   Medications Ordered in ED Medications - No data to display  ED Course  I have reviewed the triage vital signs and the nursing notes.  Pertinent labs & imaging results that were available during my care of the patient were reviewed by me and considered in my medical decision making (see chart for details).    MDM Rules/Calculators/A&P                          CT scan without acute abnormalities.  Does show constipation and she did have a BM here with good result.  Labs are reassuring as well as urinalysis.  Will discharge home Final Clinical Impression(s) / ED Diagnoses Final diagnoses:  None    Rx / DC Orders ED Discharge Orders     None        Lacretia Leigh, MD 11/25/20 2153

## 2020-11-26 DIAGNOSIS — J069 Acute upper respiratory infection, unspecified: Secondary | ICD-10-CM | POA: Diagnosis not present

## 2020-11-26 DIAGNOSIS — R300 Dysuria: Secondary | ICD-10-CM | POA: Diagnosis not present

## 2020-11-26 DIAGNOSIS — Z20822 Contact with and (suspected) exposure to covid-19: Secondary | ICD-10-CM | POA: Diagnosis not present

## 2020-11-26 DIAGNOSIS — R0989 Other specified symptoms and signs involving the circulatory and respiratory systems: Secondary | ICD-10-CM | POA: Diagnosis not present

## 2020-11-27 ENCOUNTER — Ambulatory Visit: Payer: Medicare HMO | Admitting: Physician Assistant

## 2020-11-27 LAB — URINE CULTURE: Culture: NO GROWTH

## 2020-11-28 ENCOUNTER — Ambulatory Visit (HOSPITAL_COMMUNITY): Admission: RE | Admit: 2020-11-28 | Payer: Medicare HMO | Source: Ambulatory Visit

## 2020-11-28 ENCOUNTER — Other Ambulatory Visit: Payer: Medicare HMO

## 2020-11-29 DIAGNOSIS — Z20822 Contact with and (suspected) exposure to covid-19: Secondary | ICD-10-CM | POA: Diagnosis not present

## 2020-11-29 DIAGNOSIS — I10 Essential (primary) hypertension: Secondary | ICD-10-CM | POA: Diagnosis not present

## 2020-11-29 DIAGNOSIS — R42 Dizziness and giddiness: Secondary | ICD-10-CM | POA: Diagnosis not present

## 2020-11-29 DIAGNOSIS — R5383 Other fatigue: Secondary | ICD-10-CM | POA: Diagnosis not present

## 2020-11-29 DIAGNOSIS — R0981 Nasal congestion: Secondary | ICD-10-CM | POA: Diagnosis not present

## 2020-11-29 DIAGNOSIS — R112 Nausea with vomiting, unspecified: Secondary | ICD-10-CM | POA: Diagnosis not present

## 2020-11-29 DIAGNOSIS — R519 Headache, unspecified: Secondary | ICD-10-CM | POA: Diagnosis not present

## 2020-11-29 DIAGNOSIS — I672 Cerebral atherosclerosis: Secondary | ICD-10-CM | POA: Diagnosis not present

## 2020-11-29 DIAGNOSIS — J3489 Other specified disorders of nose and nasal sinuses: Secondary | ICD-10-CM | POA: Diagnosis not present

## 2020-11-30 DIAGNOSIS — I10 Essential (primary) hypertension: Secondary | ICD-10-CM | POA: Diagnosis not present

## 2020-12-05 DIAGNOSIS — N189 Chronic kidney disease, unspecified: Secondary | ICD-10-CM | POA: Diagnosis not present

## 2020-12-05 DIAGNOSIS — U099 Post covid-19 condition, unspecified: Secondary | ICD-10-CM | POA: Diagnosis not present

## 2020-12-05 DIAGNOSIS — R634 Abnormal weight loss: Secondary | ICD-10-CM | POA: Diagnosis not present

## 2020-12-05 DIAGNOSIS — F32A Depression, unspecified: Secondary | ICD-10-CM | POA: Diagnosis not present

## 2020-12-05 DIAGNOSIS — F064 Anxiety disorder due to known physiological condition: Secondary | ICD-10-CM | POA: Diagnosis not present

## 2020-12-05 DIAGNOSIS — D6489 Other specified anemias: Secondary | ICD-10-CM | POA: Diagnosis not present

## 2020-12-05 DIAGNOSIS — I10 Essential (primary) hypertension: Secondary | ICD-10-CM | POA: Diagnosis not present

## 2020-12-05 DIAGNOSIS — H26239 Glaucomatous flecks (subcapsular), unspecified eye: Secondary | ICD-10-CM | POA: Diagnosis not present

## 2020-12-08 ENCOUNTER — Ambulatory Visit (HOSPITAL_COMMUNITY)
Admission: RE | Admit: 2020-12-08 | Discharge: 2020-12-08 | Disposition: A | Discharge status: Home / Self Care | Payer: Medicare HMO | Source: Ambulatory Visit | Attending: Urology | Admitting: Urology

## 2020-12-08 ENCOUNTER — Other Ambulatory Visit: Payer: Self-pay

## 2020-12-08 ENCOUNTER — Ambulatory Visit: Payer: Medicare HMO

## 2020-12-08 DIAGNOSIS — N261 Atrophy of kidney (terminal): Secondary | ICD-10-CM | POA: Diagnosis not present

## 2020-12-08 DIAGNOSIS — N1330 Unspecified hydronephrosis: Secondary | ICD-10-CM | POA: Diagnosis not present

## 2020-12-08 DIAGNOSIS — N132 Hydronephrosis with renal and ureteral calculous obstruction: Secondary | ICD-10-CM | POA: Diagnosis not present

## 2020-12-08 DIAGNOSIS — N250 Renal osteodystrophy: Secondary | ICD-10-CM | POA: Diagnosis not present

## 2020-12-08 MED ORDER — TECHNETIUM TC 99M MERTIATIDE
5.4000 | Freq: Once | INTRAVENOUS | Status: AC
  Administered 2020-12-08: 5.4 via INTRAVENOUS

## 2020-12-08 MED ORDER — FUROSEMIDE 10 MG/ML IJ SOLN
INTRAMUSCULAR | Status: AC
  Administered 2020-12-08: 29 mg via INTRAVENOUS
  Filled 2020-12-08: qty 4

## 2020-12-08 MED ORDER — FUROSEMIDE 10 MG/ML IJ SOLN: 29.0000 mg | Freq: Once | INTRAMUSCULAR | Status: AC

## 2020-12-19 ENCOUNTER — Other Ambulatory Visit: Payer: Medicare HMO

## 2020-12-25 ENCOUNTER — Other Ambulatory Visit: Payer: Self-pay | Admitting: *Deleted

## 2020-12-25 MED ORDER — LOSARTAN POTASSIUM 100 MG PO TABS: 100.0000 mg | ORAL_TABLET | Freq: Every day | ORAL | 2 refills | Status: DC

## 2021-01-09 DIAGNOSIS — Z6823 Body mass index (BMI) 23.0-23.9, adult: Secondary | ICD-10-CM | POA: Diagnosis not present

## 2021-01-09 DIAGNOSIS — Z8673 Personal history of transient ischemic attack (TIA), and cerebral infarction without residual deficits: Secondary | ICD-10-CM | POA: Diagnosis not present

## 2021-01-09 DIAGNOSIS — H539 Unspecified visual disturbance: Secondary | ICD-10-CM | POA: Diagnosis not present

## 2021-01-09 DIAGNOSIS — E139 Other specified diabetes mellitus without complications: Secondary | ICD-10-CM | POA: Diagnosis not present

## 2021-01-15 DIAGNOSIS — Z79899 Other long term (current) drug therapy: Secondary | ICD-10-CM | POA: Diagnosis not present

## 2021-01-15 DIAGNOSIS — E119 Type 2 diabetes mellitus without complications: Secondary | ICD-10-CM | POA: Diagnosis not present

## 2021-01-15 DIAGNOSIS — Z794 Long term (current) use of insulin: Secondary | ICD-10-CM | POA: Diagnosis not present

## 2021-01-15 DIAGNOSIS — H15833 Staphyloma posticum, bilateral: Secondary | ICD-10-CM | POA: Diagnosis not present

## 2021-01-15 DIAGNOSIS — H401134 Primary open-angle glaucoma, bilateral, indeterminate stage: Secondary | ICD-10-CM | POA: Diagnosis not present

## 2021-01-17 ENCOUNTER — Other Ambulatory Visit: Payer: Self-pay | Admitting: Medical

## 2021-01-18 DIAGNOSIS — N1330 Unspecified hydronephrosis: Secondary | ICD-10-CM | POA: Diagnosis not present

## 2021-01-18 DIAGNOSIS — N200 Calculus of kidney: Secondary | ICD-10-CM | POA: Diagnosis not present

## 2021-02-01 ENCOUNTER — Encounter (HOSPITAL_COMMUNITY): Payer: Self-pay | Admitting: Emergency Medicine

## 2021-02-01 ENCOUNTER — Emergency Department (HOSPITAL_COMMUNITY): Payer: Medicare HMO

## 2021-02-01 ENCOUNTER — Emergency Department (HOSPITAL_COMMUNITY)
Admission: EM | Admit: 2021-02-01 | Discharge: 2021-02-01 | Disposition: A | Discharge status: Home / Self Care | Payer: Medicare HMO | Attending: Emergency Medicine | Admitting: Emergency Medicine

## 2021-02-01 ENCOUNTER — Other Ambulatory Visit: Payer: Self-pay

## 2021-02-01 DIAGNOSIS — J0100 Acute maxillary sinusitis, unspecified: Secondary | ICD-10-CM | POA: Insufficient documentation

## 2021-02-01 DIAGNOSIS — Z79899 Other long term (current) drug therapy: Secondary | ICD-10-CM | POA: Insufficient documentation

## 2021-02-01 DIAGNOSIS — Z794 Long term (current) use of insulin: Secondary | ICD-10-CM | POA: Insufficient documentation

## 2021-02-01 DIAGNOSIS — N182 Chronic kidney disease, stage 2 (mild): Secondary | ICD-10-CM | POA: Insufficient documentation

## 2021-02-01 DIAGNOSIS — Z7982 Long term (current) use of aspirin: Secondary | ICD-10-CM | POA: Diagnosis not present

## 2021-02-01 DIAGNOSIS — I129 Hypertensive chronic kidney disease with stage 1 through stage 4 chronic kidney disease, or unspecified chronic kidney disease: Secondary | ICD-10-CM | POA: Insufficient documentation

## 2021-02-01 DIAGNOSIS — R519 Headache, unspecified: Secondary | ICD-10-CM | POA: Diagnosis not present

## 2021-02-01 DIAGNOSIS — Z8616 Personal history of COVID-19: Secondary | ICD-10-CM | POA: Diagnosis not present

## 2021-02-01 DIAGNOSIS — R059 Cough, unspecified: Secondary | ICD-10-CM | POA: Diagnosis not present

## 2021-02-01 DIAGNOSIS — I517 Cardiomegaly: Secondary | ICD-10-CM | POA: Diagnosis not present

## 2021-02-01 DIAGNOSIS — Z20822 Contact with and (suspected) exposure to covid-19: Secondary | ICD-10-CM | POA: Diagnosis not present

## 2021-02-01 DIAGNOSIS — E1122 Type 2 diabetes mellitus with diabetic chronic kidney disease: Secondary | ICD-10-CM | POA: Diagnosis not present

## 2021-02-01 DIAGNOSIS — R42 Dizziness and giddiness: Secondary | ICD-10-CM | POA: Diagnosis not present

## 2021-02-01 LAB — GROUP A STREP BY PCR: Group A Strep by PCR: NOT DETECTED

## 2021-02-01 LAB — RESP PANEL BY RT-PCR (FLU A&B, COVID) ARPGX2
Influenza A by PCR: NEGATIVE
Influenza B by PCR: NEGATIVE
SARS Coronavirus 2 by RT PCR: NEGATIVE

## 2021-02-01 MED ORDER — DIPHENHYDRAMINE HCL 50 MG/ML IJ SOLN
12.5000 mg | Freq: Once | INTRAMUSCULAR | Status: AC
  Administered 2021-02-01: 12.5 mg via INTRAVENOUS
  Filled 2021-02-01: qty 1

## 2021-02-01 MED ORDER — PROCHLORPERAZINE EDISYLATE 10 MG/2ML IJ SOLN
10.0000 mg | Freq: Once | INTRAMUSCULAR | Status: AC
  Administered 2021-02-01: 10 mg via INTRAVENOUS
  Filled 2021-02-01: qty 2

## 2021-02-01 MED ORDER — AMOXICILLIN-POT CLAVULANATE 875-125 MG PO TABS: 1.0000 | ORAL_TABLET | Freq: Two times a day (BID) | ORAL | 0 refills | Status: DC

## 2021-02-01 NOTE — ED Notes (Signed)
Pt called for triage, no response x1

## 2021-02-01 NOTE — ED Notes (Signed)
Patient verbalizes understanding of discharge instructions. Opportunity for questioning and answers were provided. Armband removed by staff, pt discharged from ED via wheelchair to lobby to go home with family.   

## 2021-02-01 NOTE — ED Provider Notes (Signed)
[facility] EMERGENCY DEPARTMENT Provider Note   CSN: [id number] Arrival date & time: 02/01/21  0126     History Chief Complaint  Patient presents with   Headache    Tina Horton is a 55 y.o. female with a past medical history significant for diabetes, hypertension, hyperlipidemia, previous CVA who presents to the ED after a head injury that occurred 2 days ago.  Patient states she was bending over and hit her head on a glass table.  No loss of consciousness.  She is on ASA 81 mg however, no other blood thinners. She admits to intermittent frontal headaches since associated with dizziness. No changes to speech, unilateral weakness, or speech changes. No treatment prior to arrival. No aggravating or alleviating symptoms.  She also endorses sinus pressure, left otalgia, and a productive cough for the past few days. She notes she has been using Afrin with no relief. States she may have had 1 episodes of blood in her phlegm; however is unsure because patient is legally blind in her left eye. No fever or chills. No dental pain. Denies chest pain and shortness of breath. Denies sick contact and known COVID exposures.   History obtained from patient and past medical records. No interpreter used during encounter.       Past Medical History:  Diagnosis Date   Anemia    Arthritis    knees   Bronchitis    Diabetes mellitus without complication [facility])    age 50yo   GERD (gastroesophageal reflux disease)    Glaucoma    Dr. Harriette Bouillon   H/O mammogram 2005   Headache    Heart murmur    Hyperlipidemia    Hypertension    Intermittent palpitations    Legally blind    Myopia    Pneumonia    Routine gynecological examination    last pap 2005   Seasonal allergies    Shortness of breath dyspnea    Sinusitis    Thyroid nodule     Patient Active Problem List   Diagnosis Date Noted   Advanced directives, counseling/discussion 10/30/2020   Welcome to Medicare  preventive visit 10/30/2020   Abnormal thyroid blood test 10/30/2020   Anemia of renal disease 08/22/2020   Difficulty agreeing with care plan 08/22/2020   History of recent stroke 08/07/2020   Screen for colon cancer 08/07/2020   Encounter for screening mammogram for malignant neoplasm of breast 08/07/2020   CVA (cerebral vascular accident) (HCC) 07/27/2020   History of COVID-19 05/22/2020   COVID-19 virus infection 02/25/2020   Chronic pain of both knees 01/04/2020   Ureteral stenosis 11/29/2019   CKD (chronic kidney disease) stage 2, GFR 60-89 ml/min 11/29/2019   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/13/2018   Vitamin D deficiency 03/13/2018   Primary osteoarthritis of both knees 01/20/2018   DDD (degenerative disc disease), lumbar 01/20/2018   Screening for cancer 08/20/2017   Renal cyst 01/15/2016   Hydronephrosis of left kidney 01/15/2016   Spinal stenosis 01/15/2016   History of burning pain in leg 12/04/2015   S/P laparoscopic assisted vaginal hysterectomy (LAVH) 09/13/2015   Encounter for health maintenance examination in adult 08/21/2015   Hyperlipidemia 08/21/2015   Glaucoma 08/21/2015   Heart murmur 08/21/2015   Thyroid nodule 08/21/2015   Vaccine refused by patient 08/21/2015   Noncompliance 03/21/2015   Essential hypertension 03/21/2015   Uncontrolled diabetes mellitus with complication, with long-term current use of insulin (HCC) 03/21/2015   VISUAL DISTURBANCE NOS 07/17/2006  REFLUX ESOPHAGITIS 07/17/2006    Past Surgical History:  Procedure Laterality Date   CYSTECTOMY     umbilicus   INCISION AND DRAINAGE     abdominal superficial abscess   LAPAROSCOPIC VAGINAL HYSTERECTOMY WITH SALPINGECTOMY Bilateral 09/13/2015   Procedure: LAPAROSCOPIC ASSISTED VAGINAL HYSTERECTOMY WITH SALPINGECTOMY, McCalls Colpoplasty;  Surgeon: Thurnell Lose, MD;  Location: [facility];  Service: Gynecology;  Laterality: Bilateral;   LASIK     x2     OB History   No obstetric history on file.      Family History  Problem Relation Age of Onset   Diabetes Mother    Hypertension Mother    Stroke Mother    Hypertension Father    Chronic Renal Failure Father    Hypertension Sister    Hypertension Brother    Stroke Maternal Grandmother    Asthma Daughter    Healthy Daughter    Healthy Son    Cancer Neg Hx    Heart disease Neg Hx     Social History   Tobacco Use   Smoking status: Never   Smokeless tobacco: Never  Vaping Use   Vaping Use: Never used  Substance Use Topics   Alcohol use: No    Alcohol/week: 0.0 standard drinks   Drug use: No    Home Medications Prior to Admission medications   Medication Sig Start Date End Date Taking? Authorizing Provider  amoxicillin-clavulanate (AUGMENTIN) 875-125 MG tablet Take 1 tablet by mouth every 12 (twelve) hours. 02/01/21  Yes Charmaine Downs C, PA-C  Alcohol Swabs (B-D SINGLE USE SWABS REGULAR) PADS USE AS DIRECTED WHEN TESTING BLOOD SUGAR 10/30/20   Tysinger, Camelia Eng, PA-C  aspirin EC 81 MG tablet Take 1 tablet (81 mg total) by mouth daily. 11/30/19   Tysinger, Camelia Eng, PA-C  atorvastatin (LIPITOR) 40 MG tablet Take 40 mg by mouth daily.    [provider]  Blood Glucose Monitoring Suppl (PRODIGY AUTOCODE BLOOD GLUCOSE) w/Device KIT  12/07/19   [provider]  carvedilol (COREG) 25 MG tablet Take 1 tablet (25 mg total) by mouth 2 (two) times daily. 07/26/20   Lorretta Harp, MD  cloNIDine (CATAPRES) 0.2 MG tablet Take 1 tablet (0.2 mg total) by mouth 2 (two) times daily. 11/03/20 11/03/21  Lorretta Harp, MD  glucose blood (PRODIGY NO CODING BLOOD GLUC) test strip Use as instructed 11/05/17   Tysinger, Camelia Eng, PA-C  HYDROcodone-acetaminophen (NORCO/VICODIN) 5-325 MG tablet Take 1 tablet by mouth every 6 (six) hours as needed for severe pain. 11/09/20   Petrucelli, Samantha R, PA-C  latanoprost (XALATAN) 0.005 % ophthalmic solution Place 1 drop into both eyes at bedtime.    [provider]   linaclotide Rolan Lipa) 72 MCG capsule Take 1 capsule (72 mcg total) by mouth daily before breakfast. 10/20/20   Tysinger, Camelia Eng, PA-C  losartan (COZAAR) 100 MG tablet Take 1 tablet (100 mg total) by mouth daily. 12/25/20   Lorretta Harp, MD  NOVOFINE PLUS PEN NEEDLE 32G X 4 MM MISC USE 4 TIMES A DAY WITH INSULIN INJECTIONS 08/21/20   Tysinger, Camelia Eng, PA-C  NOVOLOG FLEXPEN 100 UNIT/ML FlexPen INJECT 20 UNITS SUBCUTANEOUSLY THREE TIMES DAILY WITH MEALS (USE SLIDING SCALE PROVIDED BY MD)(PEN EXPIRES AT 28 DAYS) Patient taking differently: Inject 16 Units into the skin 3 (three) times daily with meals. 03/31/20   Tysinger, Camelia Eng, PA-C  ondansetron (ZOFRAN ODT) 4 MG disintegrating tablet Take 1 tablet (4 mg total) by mouth every 8 (  eight) hours as needed for nausea or vomiting. 11/09/20   Petrucelli, Samantha R, PA-C  PRODIGY NO CODING BLOOD GLUC test strip TEST BLOOD SUGAR THREE TIMES DAILY 10/23/20   Tysinger, Camelia Eng, PA-C  Prodigy Twist Top Lancets 28G MISC USE TO TEST BLOOD SUGAR THREE TIMES DAILY 10/23/20   Tysinger, Camelia Eng, PA-C  VITAMIN D-VITAMIN K PO Take by mouth 2 (two) times a week.    [provider]    Allergies    Amlodipine, Metformin and related, Tanzeum [albiglutide], and Lisinopril  Review of Systems   Review of Systems  Constitutional:  Negative for chills and fever.  HENT:  Positive for congestion, sinus pressure and sore throat.   Respiratory:  Positive for cough. Negative for shortness of breath.   Gastrointestinal:  Negative for abdominal pain, diarrhea, nausea and vomiting.  Neurological:  Positive for dizziness and headaches. Negative for speech difficulty.  All other systems reviewed and are negative.  Physical Exam Updated Vital Signs BP (!) 194/97   Pulse 95   Temp 98.6 F (37 C) (Oral)   Resp 19   LMP 09/13/2015   SpO2 100%   Physical Exam Vitals and nursing note reviewed.  Constitutional:      General: She is not in acute distress.     Appearance: She is not ill-appearing.  HENT:     Head: Normocephalic.     Right Ear: Tympanic membrane normal.     Left Ear: Tympanic membrane normal.     Ears:     Comments: TM normal bilaterally. No rash. No vesicles.     Nose:     Comments: TTP over frontal and maxillary sinuses.  Eyes:     Pupils: Pupils are equal, round, and reactive to light.  Cardiovascular:     Rate and Rhythm: Normal rate and regular rhythm.     Pulses: Normal pulses.     Heart sounds: Normal heart sounds. No murmur heard.   No friction rub. No gallop.  Pulmonary:     Effort: Pulmonary effort is normal.     Breath sounds: Normal breath sounds.  Abdominal:     General: Abdomen is flat. There is no distension.     Palpations: Abdomen is soft.     Tenderness: There is no abdominal tenderness. There is no guarding or rebound.  Musculoskeletal:        General: Normal range of motion.     Cervical back: Neck supple.  Skin:    General: Skin is warm and dry.  Neurological:     General: No focal deficit present.     Mental Status: She is alert.     Comments: Speech is clear, able to follow commands CN III-XII intact Normal strength in upper and lower extremities bilaterally including dorsiflexion and plantar flexion, strong and equal grip strength Sensation grossly intact throughout Moves extremities without ataxia, coordination intact No pronator drift Ambulates without difficulty  Psychiatric:        Mood and Affect: Mood normal.        Behavior: Behavior normal.    ED Results / Procedures / Treatments   Labs (all labs ordered are listed, but only abnormal results are displayed) Labs Reviewed  GROUP A STREP BY PCR  RESP PANEL BY RT-PCR (FLU A&B, COVID) [id number]    EKG None  Radiology CT HEAD WO CONTRAST (5MM)  Result Date: 02/01/2021 CLINICAL DATA:  Headache, dizziness EXAM: CT HEAD WITHOUT CONTRAST TECHNIQUE: Contiguous axial images were obtained from  the base of the skull through the vertex  without intravenous contrast. COMPARISON:  07/27/2020 FINDINGS: Brain: No evidence of acute infarction, hemorrhage, hydrocephalus, extra-axial collection or mass lesion/mass effect. Right basal ganglia lacunar infarct. Vascular: No hyperdense vessel or unexpected calcification. Skull: Normal. Negative for fracture or focal lesion. Sinuses/Orbits: The visualized paranasal sinuses are essentially clear. The mastoid air cells are unopacified. Other: None. IMPRESSION: No evidence of acute intracranial abnormality. Right basal ganglia lacunar infarct. Electronically Signed   By: Julian Hy M.D.   On: 02/01/2021 02:35   DG Chest Portable 1 View  Result Date: 02/01/2021 CLINICAL DATA:  Cough EXAM: PORTABLE CHEST 1 VIEW COMPARISON:  07/25/2020 FINDINGS: Cardiomegaly. Both lungs are clear. The visualized skeletal structures are unremarkable. IMPRESSION: Cardiomegaly without acute abnormality of the lungs in AP portable projection. Electronically Signed   By: Eddie Candle M.D.   On: 02/01/2021 12:20    Procedures Procedures   Medications Ordered in ED Medications  diphenhydrAMINE (BENADRYL) injection 12.5 mg (12.5 mg Intravenous Given 02/01/21 1206)  prochlorperazine (COMPAZINE) injection 10 mg (10 mg Intravenous Given 02/01/21 1206)    ED Course  I have reviewed the triage vital signs and the nursing notes.  Pertinent labs & imaging results that were available during my care of the patient were reviewed by me and considered in my medical decision making (see chart for details).    MDM Rules/Calculators/A&P                           56 year old female presents to the ED due to a headache after hitting her head 2 days ago.  No loss of consciousness.  Patient also endorses sinus pressure, sore throat, cough, and left otalgia.  No fever or chills.  No sick contacts or known COVID exposures.  Upon arrival, stable vitals.  Patient is afebrile, not tachycardic or hypoxic.  Patient in no acute distress.   Normal neurological exam.  No signs of basilar skull fracture.  Mild tenderness throughout frontal and maxillary sinuses. Throat with mild erythema, but not tonsillar hypertrophy or exudates. No abscess. CT head, COVID, strep test ordered at triage. Will give migraine cocktail and reassess.  CT head personally reviewed which is negative for any acute abnormalities. Old right basal ganglia lacunar infarct. Reviewed previous CT head which is same area. No new neurological symptoms. Low suspicion for acute CVA.  Chest x-ray demonstrates cardiomegaly with no signs of pneumonia.  COVID/influenza/strep negative.  Patient states headache completely resolved after Compazine and Benadryl.  Patient's BP has begun to elevate likely due to missed at home blood pressure medications since being here over 13 hours.  Shared decision making in regards to giving BP medications here or having her take them when she gets home.  Patient states she would prefer to take her medications when she gets home.  Low suspicion for hypertensive urgency/emergency.  Headache could also be related to sinusitis.  Patient discharged with Augmentin for acute sinusitis.  Advised patient follow-up with PCP for further evaluation. Strict ED precautions discussed with patient. Patient states understanding and agrees to plan. Patient discharged home in no acute distress and stable vitals  Discussed with Dr. Ashok Cordia who evaluated patient at bedside and agrees with assessment and plan.  Final Clinical Impression(s) / ED Diagnoses Final diagnoses:  Acute nonintractable headache, unspecified headache type  Acute non-recurrent maxillary sinusitis    Rx / DC Orders ED Discharge Orders  Ordered    amoxicillin-clavulanate (AUGMENTIN) 875-125 MG tablet  Every 12 hours        02/01/21 1422             Karie Kirks 02/01/21 1435    Lajean Saver, MD 02/02/21 1505

## 2021-02-01 NOTE — ED Provider Notes (Signed)
Emergency Medicine Provider Triage Evaluation Note  Tina Horton , a 55 y.o. female  was evaluated in triage.  Pt complains of headache.  She bumped her head today as she is visually impaired and since then has had headache and dizziness.  States she is concerned she may have "aggravated" her prior stroke.  Also reports sore throat and left ear pain.  Takes daily ASA but no other anticoagulation.  No fevers.  No sick contacts.  Review of Systems  Positive: Headache, sore throat, ear pain Negative: fevef  Physical Exam  BP (!) 179/81   Pulse 83   Temp 98.6 F (37 C) (Oral)   Resp 16   LMP 09/13/2015   SpO2 100%   Gen:   Awake, no distress   Resp:  Normal effort  MSK:   Moves extremities without difficulty  Other:  Head without focal signs of trauma, visually impaired  Medical Decision Making  Medically screening exam initiated at 1:52 AM.  Appropriate orders placed.  Tina Horton was informed that the remainder of the evaluation will be completed by another provider, this initial triage assessment does not replace that evaluation, and the importance of remaining in the ED until their evaluation is complete.  Given her reported head trauma with subsequent dizziness, will get head CT.   Will check rapid strep and covid screen.   [name], PA-C 02/01/21 0201    Ripley Fraise, MD 02/01/21 [phone]

## 2021-02-01 NOTE — Discharge Instructions (Addendum)
It was a pleasure taking care of you today.  As discussed, your CT head did not show any bleeding in your brain.  It did show your old stroke you previously had.  Your COVID/influenza test were negative.  I am sending you home with an antibiotic for a sinus infection.  Take as prescribed and finish all antibiotics.  Your blood pressure was elevated here in the ER.  Please take your home medications as prescribed.  Follow-up with PCP within 1 week.  Return to the ER for new or worsening symptoms.

## 2021-02-01 NOTE — ED Triage Notes (Signed)
T reports she bumped her head and now has a headache. Denies any LOC.

## 2021-02-05 ENCOUNTER — Ambulatory Visit (INDEPENDENT_AMBULATORY_CARE_PROVIDER_SITE_OTHER): Payer: Medicare HMO | Admitting: Neurology

## 2021-02-05 ENCOUNTER — Other Ambulatory Visit: Payer: Self-pay

## 2021-02-05 ENCOUNTER — Encounter: Payer: Self-pay | Admitting: Neurology

## 2021-02-05 VITALS — BP 170/88 | HR 71 | Ht 61.0 in | Wt 129.0 lb

## 2021-02-05 DIAGNOSIS — I639 Cerebral infarction, unspecified: Secondary | ICD-10-CM | POA: Diagnosis not present

## 2021-02-05 NOTE — Progress Notes (Signed)
Follow-up Visit   Date: 02/05/21   Tina Horton MRN: [id number] DOB: 1965-11-19   Interim History: Tina Horton is a 55 y.o. right-handed female with insulin-dependent diabetes mellitus, hyperlipidemia, hypertension, severe myopia and glaucoma - essentially legally blind but able to see light OU presenting for evaluate of stroke returning to the clinic for follow-up of stroke.  The patient was accompanied to the clinic by husband who also provides collateral information.    History of present illness: She presented to the ER on 3/8 with imbalance and was discharged. On 3/10, she woke up with left arm and leg weakness with worsening imbalance.  She was taken to the ER where CT head showed acute right basal ganglia and corona radiata stroke.  MRI brain confirmed the presence of the stroke, MRA head and US carotid was normal.   NIHSS 2.  She did not receive tPA.  Etiology suspect to be small vessel disease. She was started on atorvastatin 3m and dual antiplatelet therapy with aspirin 884m+ plavix 7569maily.  She was taking aspirin 43m43meviously.  Her blood pressure was noted to be elevated (170-180s).    Since she has been home, her left side weakness is improving.  She has some weakness with the left hand with coordination. She continues to have some imbalance and is very careful when walking.  At home she has someone next to her. She has not started PT yet.  She denies any numbness/tingling, dysphagia, or dysarthria.  Her vision is impaired at baseline, no new changes.   UPDATE 09/08/2020:  She is here because of complaints that her blood pressure was elevated (160s) and I encouraged her to discuss this with her nephrologist, who is managed her BP.  She has been started on clonidine which is better controlling her BP.  She is monitoring this multiple times a day, husband says even every hour.  She is very concerned that her elevated BP will result in another stroke.  She  denies any new neurological symptoms.  Her left sided weakness has markedly improved, she has slight weakness in the hand only.  Her balance is also improving.  She walks unassisted at home. She has completed PT and OT early after meeting goals.  She continues to use a transport chair for long distances.   UPDATE 02/05/2021:  She is here for follow-up visit.  She has some residual weakness in the right hand from her stroke, but overall feels very happy with her left side recovery.  Sometimes, her leg continues to have problems with coordination.  She reports having rare spells of imbalance about 2-3 times per week. She went to the ER last week because of headache and right ear pain.  CT head was negative for acute process.  She was given antibiotics for sinus infection.   Medications:  Current Outpatient Medications on File Prior to Visit  Medication Sig Dispense Refill   Alcohol Swabs (B-D SINGLE USE SWABS REGULAR) PADS USE AS DIRECTED WHEN TESTING BLOOD SUGAR 300 each 0   amoxicillin-clavulanate (AUGMENTIN) 875-125 MG tablet Take 1 tablet by mouth every 12 (twelve) hours. 14 tablet 0   aspirin EC 81 MG tablet Take 1 tablet (81 mg total) by mouth daily. 90 tablet 3   atorvastatin (LIPITOR) 40 MG tablet Take 40 mg by mouth daily.     Blood Glucose Monitoring Suppl (PRODIGY AUTOCODE BLOOD GLUCOSE) w/Device KIT      carvedilol (COREG) 25 MG tablet  Take 1 tablet (25 mg total) by mouth 2 (two) times daily. 180 tablet 3   cloNIDine (CATAPRES) 0.2 MG tablet Take 1 tablet (0.2 mg total) by mouth 2 (two) times daily. 180 tablet 3   glucose blood (PRODIGY NO CODING BLOOD GLUC) test strip Use as instructed 100 each 12   HYDROcodone-acetaminophen (NORCO/VICODIN) 5-325 MG tablet Take 1 tablet by mouth every 6 (six) hours as needed for severe pain. 6 tablet 0   latanoprost (XALATAN) 0.005 % ophthalmic solution Place 1 drop into both eyes at bedtime.     linaclotide (LINZESS) 72 MCG capsule Take 1 capsule (72 mcg  total) by mouth daily before breakfast. 30 capsule 5   losartan (COZAAR) 100 MG tablet Take 1 tablet (100 mg total) by mouth daily. 90 tablet 2   NOVOFINE PLUS PEN NEEDLE 32G X 4 MM MISC USE 4 TIMES A DAY WITH INSULIN INJECTIONS 100 each 0   NOVOLOG FLEXPEN 100 UNIT/ML FlexPen INJECT 20 UNITS SUBCUTANEOUSLY THREE TIMES DAILY WITH MEALS (USE SLIDING SCALE PROVIDED BY MD)(PEN EXPIRES AT 28 DAYS) (Patient taking differently: Inject 16 Units into the skin 3 (three) times daily with meals. Between 4-10 units) 60 mL 5   ondansetron (ZOFRAN ODT) 4 MG disintegrating tablet Take 1 tablet (4 mg total) by mouth every 8 (eight) hours as needed for nausea or vomiting. 10 tablet 0   PRODIGY NO CODING BLOOD GLUC test strip TEST BLOOD SUGAR THREE TIMES DAILY 300 strip 0   Prodigy Twist Top Lancets 28G MISC USE TO TEST BLOOD SUGAR THREE TIMES DAILY 300 each 0   TRESIBA FLEXTOUCH 100 UNIT/ML FlexTouch Pen Inject into the skin. 10 units nightly     VITAMIN D-VITAMIN K PO Take by mouth 2 (two) times a week.     No current facility-administered medications on file prior to visit.    Allergies:  Allergies  Allergen Reactions   Amlodipine Swelling    Swelling    Metformin And Related Diarrhea and Nausea And Vomiting   Tanzeum [Albiglutide] Nausea And Vomiting   Lisinopril Other (See Comments)    Refuses, says it causes cancer    Vital Signs:  BP (!) 170/88   Pulse 71   Ht $R'5\' 1"'Xx$  (1.549 m)   Wt 129 lb (58.5 kg)   LMP 09/13/2015   SpO2 100%   BMI 24.37 kg/m   Neurological Exam: MENTAL STATUS including orientation to time, place, person, recent and remote memory, attention span and concentration, language, and fund of knowledge is normal.  Speech is not dysarthric.  CRANIAL NERVES: Light perception only in the left eye, gross movements are perceivable in the right eye.   No visual field defects.  Pupils equal round and reactive to light.  Normal conjugate, extra-ocular eye movements in all directions of  gaze.  No ptosis.  Face is symmetric.  MOTOR:  Motor strength is 5/5 in all extremities, except trace left hand weakness with grip.  No atrophy, fasciculations or abnormal movements.  No pronator drift.  Tone is normal.    MSRs:  Reflexes are 2+/4 throughout.  SENSORY:  Intact to vibration throughout.  COORDINATION/GAIT:  Normal finger-to- nose-finger.  Intact rapid alternating movements bilaterally. Unassisted gait appears slow, steady, and stable.  Data: summarized as:  CT head 07/27/20: Acute infarct in the right basal ganglia, extending into overlying corona radiata. Mild edema without mass effect.    MRI head 07/27/20:   1. Acute infarct involving the right corona radiata and right basal  ganglia. Associated edema without mass effect. 2. Bilateral posterior staphylomas.   MRA head 07/27/2020:   Normal intracranial MR angiography.   Echo 07/28/20:  EF 70-75%   US carotids 07/28/20:  1-39% bilaterally  Lab Results  Component Value Date   LDLCALC 64 11/22/2020   Lab Results  Component Value Date   HGBA1C 8.1 (H) 10/30/2020     IMPRESSION/PLAN: Right basal ganglia stroke manifesting with hemiataxia and hemiparesis (07/2020), improving.  Etiology - small vessel disease. Completed therapy with dual anti-platelet therapy, now on aspiring.  Management is optimizing risk factors such as diabetes, hypertension, and hyperlipidemia.    - Continue aspirin 83m daily  - Continue atorvastatin 419mdaily - LDL much better down from 165 > 64  - Blood pressure needs to be optimized, it is elevated today 170/88 and at home SBP is ranging around 160s.  I have asked her to follow-up with nephrologist/cardiology for BP management  - Diabetes management as per PCP   Return to clinic in 1 year   Thank you for allowing me to participate in patient's care.  If I can answer any additional questions, I would be pleased to do so.    Sincerely,    Irina Okelly K. PaPosey ProntoDO

## 2021-02-05 NOTE — Patient Instructions (Signed)
Return to clinic 1 year  

## 2021-03-07 ENCOUNTER — Other Ambulatory Visit: Payer: Self-pay

## 2021-03-07 ENCOUNTER — Encounter: Payer: Self-pay | Admitting: Cardiovascular Disease

## 2021-03-07 ENCOUNTER — Ambulatory Visit: Payer: Medicare HMO | Admitting: Cardiovascular Disease

## 2021-03-07 VITALS — BP 144/80 | HR 70 | Ht 61.0 in | Wt 136.8 lb

## 2021-03-07 DIAGNOSIS — I10 Essential (primary) hypertension: Secondary | ICD-10-CM | POA: Diagnosis not present

## 2021-03-07 DIAGNOSIS — R002 Palpitations: Secondary | ICD-10-CM

## 2021-03-07 DIAGNOSIS — E782 Mixed hyperlipidemia: Secondary | ICD-10-CM | POA: Diagnosis not present

## 2021-03-07 NOTE — Progress Notes (Signed)
   03/07/2021 Tina Horton   07/11/1965  [id number]  Primary Physician Sun, Vyvyan, MD Primary Cardiologist: Jonathan J Berry MD FACP, FACC, FAHA, FSCAI  HPI:  Tina Horton is a 55 y.o.  moderately overweight married African American female mother of 3 who I last saw in the office 08/25/2020.  She is accompanied by her husband [name] today. She was referred for preoperative clearance before elective hysterectomy. 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 Her cardiac risk factor profile is notable for treated hypertension, diabetes and hyperlipidemia. She has never had a heart attack or stroke. She denies chest pain or shortness of breath. She does have a murmur apparently.  I obtain a 2D echocardiogram on 09/04/2015 which was entirely normal.  Routine GXT done for preoperative clearance for a hysterectomy was normal as well.   She did have COVID-19 in September of l 2021 and has had difficult blood pressure management since that time.  She was admitted to the hospital the day after I saw her with a stroke.  She was having some unsteadiness of gait.  The MRI showed a right corona radiata/infarct.  2D echo was unrevealing at work as were carotid Dopplers.  She wore an event monitor that showed no A. fib.  Her carvedilol was uptitrated to 25 mg p.o. twice daily.  She was also started on high-dose atorvastatin.  Since I saw her 6 months ago she is remained stable.  Her blood pressures are under good control.  She denies chest pain or shortness of breath.  Her balance and left upper extremity motor deficits have resolved since I last saw her.   Current Meds  Medication Sig   Alcohol Swabs (B-D SINGLE USE SWABS REGULAR) PADS USE AS DIRECTED WHEN TESTING BLOOD SUGAR   amoxicillin-clavulanate (AUGMENTIN) 875-125 MG tablet Take 1 tablet by mouth every 12 (twelve) hours.   aspirin EC 81 MG tablet Take 1 tablet (81 mg total) by mouth daily.    atorvastatin (LIPITOR) 40 MG tablet Take 40 mg by mouth daily.   Blood Glucose Monitoring Suppl (PRODIGY AUTOCODE BLOOD GLUCOSE) w/Device KIT    carvedilol (COREG) 25 MG tablet Take 1 tablet (25 mg total) by mouth 2 (two) times daily.   cloNIDine (CATAPRES) 0.2 MG tablet Take 1 tablet (0.2 mg total) by mouth 2 (two) times daily.   glucose blood (PRODIGY NO CODING BLOOD GLUC) test strip Use as instructed   HYDROcodone-acetaminophen (NORCO/VICODIN) 5-325 MG tablet Take 1 tablet by mouth every 6 (six) hours as needed for severe pain.   latanoprost (XALATAN) 0.005 % ophthalmic solution Place 1 drop into both eyes at bedtime.   linaclotide (LINZESS) 72 MCG capsule Take 1 capsule (72 mcg total) by mouth daily before breakfast.   losartan (COZAAR) 100 MG tablet Take 1 tablet (100 mg total) by mouth daily.   NOVOFINE PLUS PEN NEEDLE 32G X 4 MM MISC USE 4 TIMES A DAY WITH INSULIN INJECTIONS   NOVOLOG FLEXPEN 100 UNIT/ML FlexPen INJECT 20 UNITS SUBCUTANEOUSLY THREE TIMES DAILY WITH MEALS (USE SLIDING SCALE PROVIDED BY MD)(PEN EXPIRES AT 28 DAYS) (Patient taking differently: Inject 16 Units into the skin 3 (three) times daily with meals. Between 4-10 units)   ondansetron (ZOFRAN ODT) 4 MG disintegrating tablet Take 1 tablet (4 mg total) by mouth every 8 (eight) hours as needed for nausea or vomiting.   PRODIGY NO CODING BLOOD GLUC   test strip TEST BLOOD SUGAR THREE TIMES DAILY   Prodigy Twist Top Lancets 28G MISC USE TO TEST BLOOD SUGAR THREE TIMES DAILY   TRESIBA FLEXTOUCH 100 UNIT/ML FlexTouch Pen Inject into the skin. 10 units nightly   VITAMIN D-VITAMIN K PO Take by mouth 2 (two) times a week.     Allergies  Allergen Reactions   Amlodipine Swelling    Swelling    Metformin And Related Diarrhea and Nausea And Vomiting   Tanzeum [Albiglutide] Nausea And Vomiting   Lisinopril Other (See Comments)    Refuses, says it causes cancer    Social History   Socioeconomic History   Marital status:  Married    Spouse name: Not on file   Number of children: Not on file   Years of education: Not on file   Highest education level: Not on file  Occupational History   Not on file  Tobacco Use   Smoking status: Never   Smokeless tobacco: Never  Vaping Use   Vaping Use: Never used  Substance and Sexual Activity   Alcohol use: No    Alcohol/week: 0.0 standard drinks   Drug use: No   Sexual activity: Not on file  Other Topics Concern   Not on file  Social History Narrative   Married, has 3 children, not exercising, but walks at work.     Lives in a one story home.     On disability.  Education: some college.      Update: No longer working, active in the home. Still on disability   Right handed    Social Determinants of Health   Financial Resource Strain: Not on file  Food Insecurity: Not on file  Transportation Needs: Not on file  Physical Activity: Not on file  Stress: Not on file  Social Connections: Not on file  Intimate Partner Violence: Not on file     Review of Systems: General: negative for chills, fever, night sweats or weight changes.  Cardiovascular: negative for chest pain, dyspnea on exertion, edema, orthopnea, palpitations, paroxysmal nocturnal dyspnea or shortness of breath Dermatological: negative for rash Respiratory: negative for cough or wheezing Urologic: negative for hematuria Abdominal: negative for nausea, vomiting, diarrhea, bright red blood per rectum, melena, or hematemesis Neurologic: negative for visual changes, syncope, or dizziness All other systems reviewed and are otherwise negative except as noted above.    Blood pressure (!) 144/80, pulse 70, height 5' 1" (1.549 m), weight 136 lb 12.8 oz (62.1 kg), last menstrual period 09/13/2015, SpO2 98 %.  General appearance: alert and no distress Neck: no adenopathy, no carotid bruit, no JVD, supple, symmetrical, trachea midline, and thyroid not enlarged, symmetric, no  tenderness/mass/nodules Lungs: clear to auscultation bilaterally Heart: regular rate and rhythm, S1, S2 normal, no murmur, click, rub or gallop Extremities: extremities normal, atraumatic, no cyanosis or edema Pulses: 2+ and symmetric Skin: Skin color, texture, turgor normal. No rashes or lesions Neurologic: Grossly normal  EKG sinus rhythm at 70 without ST or T wave changes.  Personally reviewed this EKG.  ASSESSMENT AND PLAN:   Essential hypertension History of essential hypertension blood pressure measured today 144/80.  She is on carvedilol, clonidine.  Hyperlipidemia History of hyperlipidemia on statin therapy with lipid profile performed 11/22/2020 revealing total cholesterol of 131, LDL 64 and HDL 51.     Jonathan J. Berry MD FACP,FACC,FAHA, FSCAI 03/07/2021 10:56 AM  

## 2021-03-07 NOTE — Patient Instructions (Signed)

## 2021-03-07 NOTE — Assessment & Plan Note (Signed)
History of essential hypertension blood pressure measured today 144/80.  She is on carvedilol, clonidine.

## 2021-03-07 NOTE — Assessment & Plan Note (Signed)
History of hyperlipidemia on statin therapy with lipid profile performed 11/22/2020 revealing total cholesterol of 131, LDL 64 and HDL 51.

## 2021-03-08 DIAGNOSIS — Z794 Long term (current) use of insulin: Secondary | ICD-10-CM | POA: Diagnosis not present

## 2021-03-08 DIAGNOSIS — H4423 Degenerative myopia, bilateral: Secondary | ICD-10-CM | POA: Diagnosis not present

## 2021-03-08 DIAGNOSIS — E119 Type 2 diabetes mellitus without complications: Secondary | ICD-10-CM | POA: Diagnosis not present

## 2021-03-08 DIAGNOSIS — Z79899 Other long term (current) drug therapy: Secondary | ICD-10-CM | POA: Diagnosis not present

## 2021-03-08 DIAGNOSIS — H401134 Primary open-angle glaucoma, bilateral, indeterminate stage: Secondary | ICD-10-CM | POA: Diagnosis not present

## 2021-03-08 DIAGNOSIS — H15833 Staphyloma posticum, bilateral: Secondary | ICD-10-CM | POA: Diagnosis not present

## 2021-03-28 ENCOUNTER — Other Ambulatory Visit: Payer: Self-pay | Admitting: Medical

## 2021-04-11 DIAGNOSIS — R809 Proteinuria, unspecified: Secondary | ICD-10-CM | POA: Diagnosis not present

## 2021-04-11 DIAGNOSIS — D631 Anemia in chronic kidney disease: Secondary | ICD-10-CM | POA: Diagnosis not present

## 2021-04-11 DIAGNOSIS — N1330 Unspecified hydronephrosis: Secondary | ICD-10-CM | POA: Diagnosis not present

## 2021-04-11 DIAGNOSIS — N189 Chronic kidney disease, unspecified: Secondary | ICD-10-CM | POA: Diagnosis not present

## 2021-04-11 DIAGNOSIS — I129 Hypertensive chronic kidney disease with stage 1 through stage 4 chronic kidney disease, or unspecified chronic kidney disease: Secondary | ICD-10-CM | POA: Diagnosis not present

## 2021-04-11 DIAGNOSIS — E1122 Type 2 diabetes mellitus with diabetic chronic kidney disease: Secondary | ICD-10-CM | POA: Diagnosis not present

## 2021-04-11 DIAGNOSIS — E559 Vitamin D deficiency, unspecified: Secondary | ICD-10-CM | POA: Diagnosis not present

## 2021-04-11 DIAGNOSIS — N182 Chronic kidney disease, stage 2 (mild): Secondary | ICD-10-CM | POA: Diagnosis not present

## 2021-05-08 DIAGNOSIS — Z6825 Body mass index (BMI) 25.0-25.9, adult: Secondary | ICD-10-CM | POA: Diagnosis not present

## 2021-05-08 DIAGNOSIS — E139 Other specified diabetes mellitus without complications: Secondary | ICD-10-CM | POA: Diagnosis not present

## 2021-05-08 DIAGNOSIS — Z8673 Personal history of transient ischemic attack (TIA), and cerebral infarction without residual deficits: Secondary | ICD-10-CM | POA: Diagnosis not present

## 2021-05-08 DIAGNOSIS — Z8719 Personal history of other diseases of the digestive system: Secondary | ICD-10-CM | POA: Diagnosis not present

## 2021-05-08 DIAGNOSIS — H539 Unspecified visual disturbance: Secondary | ICD-10-CM | POA: Diagnosis not present

## 2021-05-13 ENCOUNTER — Other Ambulatory Visit: Payer: Self-pay | Admitting: Cardiovascular Disease

## 2021-05-31 DIAGNOSIS — N182 Chronic kidney disease, stage 2 (mild): Secondary | ICD-10-CM | POA: Diagnosis not present

## 2021-06-05 DIAGNOSIS — K5900 Constipation, unspecified: Secondary | ICD-10-CM | POA: Diagnosis not present

## 2021-06-05 DIAGNOSIS — E785 Hyperlipidemia, unspecified: Secondary | ICD-10-CM | POA: Diagnosis not present

## 2021-06-05 DIAGNOSIS — E1169 Type 2 diabetes mellitus with other specified complication: Secondary | ICD-10-CM | POA: Diagnosis not present

## 2021-06-05 DIAGNOSIS — I69354 Hemiplegia and hemiparesis following cerebral infarction affecting left non-dominant side: Secondary | ICD-10-CM | POA: Diagnosis not present

## 2021-06-05 DIAGNOSIS — H409 Unspecified glaucoma: Secondary | ICD-10-CM | POA: Diagnosis not present

## 2021-06-05 DIAGNOSIS — I10 Essential (primary) hypertension: Secondary | ICD-10-CM | POA: Diagnosis not present

## 2021-06-05 DIAGNOSIS — Z794 Long term (current) use of insulin: Secondary | ICD-10-CM | POA: Diagnosis not present

## 2021-06-18 DIAGNOSIS — H15833 Staphyloma posticum, bilateral: Secondary | ICD-10-CM | POA: Diagnosis not present

## 2021-06-18 DIAGNOSIS — E119 Type 2 diabetes mellitus without complications: Secondary | ICD-10-CM | POA: Diagnosis not present

## 2021-06-18 DIAGNOSIS — H401134 Primary open-angle glaucoma, bilateral, indeterminate stage: Secondary | ICD-10-CM | POA: Diagnosis not present

## 2021-06-24 DIAGNOSIS — H6983 Other specified disorders of Eustachian tube, bilateral: Secondary | ICD-10-CM | POA: Diagnosis not present

## 2021-06-24 DIAGNOSIS — H60501 Unspecified acute noninfective otitis externa, right ear: Secondary | ICD-10-CM | POA: Diagnosis not present

## 2021-07-10 DIAGNOSIS — H401134 Primary open-angle glaucoma, bilateral, indeterminate stage: Secondary | ICD-10-CM | POA: Diagnosis not present

## 2021-07-10 DIAGNOSIS — H4423 Degenerative myopia, bilateral: Secondary | ICD-10-CM | POA: Diagnosis not present

## 2021-07-10 DIAGNOSIS — H15833 Staphyloma posticum, bilateral: Secondary | ICD-10-CM | POA: Diagnosis not present

## 2021-07-10 DIAGNOSIS — E119 Type 2 diabetes mellitus without complications: Secondary | ICD-10-CM | POA: Diagnosis not present

## 2021-07-16 DIAGNOSIS — N61 Mastitis without abscess: Secondary | ICD-10-CM | POA: Diagnosis not present

## 2021-07-19 DIAGNOSIS — N611 Abscess of the breast and nipple: Secondary | ICD-10-CM | POA: Diagnosis not present

## 2021-07-20 DIAGNOSIS — N130 Hydronephrosis with ureteropelvic junction obstruction: Secondary | ICD-10-CM | POA: Diagnosis not present

## 2021-07-20 DIAGNOSIS — N200 Calculus of kidney: Secondary | ICD-10-CM | POA: Diagnosis not present

## 2021-07-21 DIAGNOSIS — R519 Headache, unspecified: Secondary | ICD-10-CM | POA: Diagnosis not present

## 2021-07-21 DIAGNOSIS — Z03818 Encounter for observation for suspected exposure to other biological agents ruled out: Secondary | ICD-10-CM | POA: Diagnosis not present

## 2021-07-21 DIAGNOSIS — J329 Chronic sinusitis, unspecified: Secondary | ICD-10-CM | POA: Diagnosis not present

## 2021-07-21 DIAGNOSIS — R6883 Chills (without fever): Secondary | ICD-10-CM | POA: Diagnosis not present

## 2021-07-21 DIAGNOSIS — B9789 Other viral agents as the cause of diseases classified elsewhere: Secondary | ICD-10-CM | POA: Diagnosis not present

## 2021-07-24 DIAGNOSIS — B349 Viral infection, unspecified: Secondary | ICD-10-CM | POA: Diagnosis not present

## 2021-07-24 DIAGNOSIS — N611 Abscess of the breast and nipple: Secondary | ICD-10-CM | POA: Diagnosis not present

## 2021-07-24 DIAGNOSIS — J029 Acute pharyngitis, unspecified: Secondary | ICD-10-CM | POA: Diagnosis not present

## 2021-07-30 DIAGNOSIS — N611 Abscess of the breast and nipple: Secondary | ICD-10-CM | POA: Diagnosis not present

## 2021-08-01 DIAGNOSIS — S21002D Unspecified open wound of left breast, subsequent encounter: Secondary | ICD-10-CM | POA: Diagnosis not present

## 2021-08-04 DIAGNOSIS — N611 Abscess of the breast and nipple: Secondary | ICD-10-CM | POA: Diagnosis not present

## 2021-08-07 DIAGNOSIS — N611 Abscess of the breast and nipple: Secondary | ICD-10-CM | POA: Diagnosis not present

## 2021-08-13 DIAGNOSIS — N611 Abscess of the breast and nipple: Secondary | ICD-10-CM | POA: Diagnosis not present

## 2021-08-17 ENCOUNTER — Other Ambulatory Visit: Payer: Self-pay | Admitting: Cardiovascular Disease

## 2021-08-17 DIAGNOSIS — N611 Abscess of the breast and nipple: Secondary | ICD-10-CM | POA: Diagnosis not present

## 2021-08-28 DIAGNOSIS — S21002S Unspecified open wound of left breast, sequela: Secondary | ICD-10-CM | POA: Diagnosis not present

## 2021-08-28 DIAGNOSIS — Z5189 Encounter for other specified aftercare: Secondary | ICD-10-CM | POA: Diagnosis not present

## 2021-08-28 DIAGNOSIS — I10 Essential (primary) hypertension: Secondary | ICD-10-CM | POA: Diagnosis not present

## 2021-08-28 DIAGNOSIS — N611 Abscess of the breast and nipple: Secondary | ICD-10-CM | POA: Diagnosis not present

## 2021-08-29 DIAGNOSIS — Z8673 Personal history of transient ischemic attack (TIA), and cerebral infarction without residual deficits: Secondary | ICD-10-CM | POA: Diagnosis not present

## 2021-08-29 DIAGNOSIS — E139 Other specified diabetes mellitus without complications: Secondary | ICD-10-CM | POA: Diagnosis not present

## 2021-08-29 DIAGNOSIS — Z6825 Body mass index (BMI) 25.0-25.9, adult: Secondary | ICD-10-CM | POA: Diagnosis not present

## 2021-08-29 DIAGNOSIS — H539 Unspecified visual disturbance: Secondary | ICD-10-CM | POA: Diagnosis not present

## 2021-08-29 DIAGNOSIS — Z8719 Personal history of other diseases of the digestive system: Secondary | ICD-10-CM | POA: Diagnosis not present

## 2021-09-05 DIAGNOSIS — S21002D Unspecified open wound of left breast, subsequent encounter: Secondary | ICD-10-CM | POA: Diagnosis not present

## 2021-09-05 DIAGNOSIS — Z5189 Encounter for other specified aftercare: Secondary | ICD-10-CM | POA: Diagnosis not present

## 2021-09-05 DIAGNOSIS — I10 Essential (primary) hypertension: Secondary | ICD-10-CM | POA: Diagnosis not present

## 2021-10-02 DIAGNOSIS — N182 Chronic kidney disease, stage 2 (mild): Secondary | ICD-10-CM | POA: Diagnosis not present

## 2021-10-11 DIAGNOSIS — E1122 Type 2 diabetes mellitus with diabetic chronic kidney disease: Secondary | ICD-10-CM | POA: Diagnosis not present

## 2021-10-11 DIAGNOSIS — N182 Chronic kidney disease, stage 2 (mild): Secondary | ICD-10-CM | POA: Diagnosis not present

## 2021-10-11 DIAGNOSIS — D631 Anemia in chronic kidney disease: Secondary | ICD-10-CM | POA: Diagnosis not present

## 2021-10-11 DIAGNOSIS — I129 Hypertensive chronic kidney disease with stage 1 through stage 4 chronic kidney disease, or unspecified chronic kidney disease: Secondary | ICD-10-CM | POA: Diagnosis not present

## 2021-10-11 DIAGNOSIS — E559 Vitamin D deficiency, unspecified: Secondary | ICD-10-CM | POA: Diagnosis not present

## 2021-10-11 DIAGNOSIS — R809 Proteinuria, unspecified: Secondary | ICD-10-CM | POA: Diagnosis not present

## 2021-11-01 ENCOUNTER — Ambulatory Visit: Payer: Medicare HMO | Admitting: Medical

## 2021-11-01 DIAGNOSIS — N1330 Unspecified hydronephrosis: Secondary | ICD-10-CM | POA: Diagnosis not present

## 2021-11-01 DIAGNOSIS — N200 Calculus of kidney: Secondary | ICD-10-CM | POA: Diagnosis not present

## 2021-11-01 DIAGNOSIS — N130 Hydronephrosis with ureteropelvic junction obstruction: Secondary | ICD-10-CM | POA: Diagnosis not present

## 2021-11-06 DIAGNOSIS — H15833 Staphyloma posticum, bilateral: Secondary | ICD-10-CM | POA: Diagnosis not present

## 2021-11-06 DIAGNOSIS — H401134 Primary open-angle glaucoma, bilateral, indeterminate stage: Secondary | ICD-10-CM | POA: Diagnosis not present

## 2021-11-06 DIAGNOSIS — E119 Type 2 diabetes mellitus without complications: Secondary | ICD-10-CM | POA: Diagnosis not present

## 2021-11-08 DIAGNOSIS — N130 Hydronephrosis with ureteropelvic junction obstruction: Secondary | ICD-10-CM | POA: Diagnosis not present

## 2021-11-08 DIAGNOSIS — N270 Small kidney, unilateral: Secondary | ICD-10-CM | POA: Diagnosis not present

## 2021-11-22 DIAGNOSIS — E119 Type 2 diabetes mellitus without complications: Secondary | ICD-10-CM | POA: Diagnosis not present

## 2021-11-22 DIAGNOSIS — Z794 Long term (current) use of insulin: Secondary | ICD-10-CM | POA: Diagnosis not present

## 2021-11-22 DIAGNOSIS — H401134 Primary open-angle glaucoma, bilateral, indeterminate stage: Secondary | ICD-10-CM | POA: Diagnosis not present

## 2021-11-22 DIAGNOSIS — Z79899 Other long term (current) drug therapy: Secondary | ICD-10-CM | POA: Diagnosis not present

## 2021-11-22 DIAGNOSIS — H15833 Staphyloma posticum, bilateral: Secondary | ICD-10-CM | POA: Diagnosis not present

## 2021-11-22 DIAGNOSIS — Z7984 Long term (current) use of oral hypoglycemic drugs: Secondary | ICD-10-CM | POA: Diagnosis not present

## 2021-12-10 DIAGNOSIS — H40009 Preglaucoma, unspecified, unspecified eye: Secondary | ICD-10-CM | POA: Diagnosis not present

## 2021-12-10 DIAGNOSIS — H5210 Myopia, unspecified eye: Secondary | ICD-10-CM | POA: Diagnosis not present

## 2021-12-10 DIAGNOSIS — E109 Type 1 diabetes mellitus without complications: Secondary | ICD-10-CM | POA: Diagnosis not present

## 2021-12-26 ENCOUNTER — Other Ambulatory Visit: Payer: Self-pay | Admitting: *Deleted

## 2021-12-28 DIAGNOSIS — I700 Atherosclerosis of aorta: Secondary | ICD-10-CM | POA: Diagnosis not present

## 2021-12-28 DIAGNOSIS — Z1389 Encounter for screening for other disorder: Secondary | ICD-10-CM | POA: Diagnosis not present

## 2021-12-28 DIAGNOSIS — H409 Unspecified glaucoma: Secondary | ICD-10-CM | POA: Diagnosis not present

## 2021-12-28 DIAGNOSIS — Z0000 Encounter for general adult medical examination without abnormal findings: Secondary | ICD-10-CM | POA: Diagnosis not present

## 2021-12-28 DIAGNOSIS — E1165 Type 2 diabetes mellitus with hyperglycemia: Secondary | ICD-10-CM | POA: Diagnosis not present

## 2021-12-28 DIAGNOSIS — I69354 Hemiplegia and hemiparesis following cerebral infarction affecting left non-dominant side: Secondary | ICD-10-CM | POA: Diagnosis not present

## 2021-12-28 DIAGNOSIS — E785 Hyperlipidemia, unspecified: Secondary | ICD-10-CM | POA: Diagnosis not present

## 2021-12-28 DIAGNOSIS — M48061 Spinal stenosis, lumbar region without neurogenic claudication: Secondary | ICD-10-CM | POA: Diagnosis not present

## 2021-12-28 DIAGNOSIS — I10 Essential (primary) hypertension: Secondary | ICD-10-CM | POA: Diagnosis not present

## 2021-12-31 NOTE — Patient Outreach (Signed)
  Care Coordination   12/31/2021 Name: Tina Horton MRN: [id number] DOB: 05/01/1966   Care Coordination Outreach Attempts:  An unsuccessful telephone outreach was attempted today to offer the patient information about available care coordination services as a benefit of their health plan.   Follow Up Plan:  Additional outreach attempts will be made to offer the patient care coordination information and services.   Encounter Outcome:  No Answer  Care Coordination Interventions Activated:  Yes   Care Coordination Interventions:  No, not indicated    SIG Tina Horton Neither, MSN, [facility] Gerontological Nurse Practitioner [facility] Care Management [phone]

## 2022-01-03 DIAGNOSIS — Z0100 Encounter for examination of eyes and vision without abnormal findings: Secondary | ICD-10-CM | POA: Diagnosis not present

## 2022-01-25 ENCOUNTER — Telehealth: Payer: Self-pay | Admitting: *Deleted

## 2022-01-25 NOTE — Patient Outreach (Addendum)
  Care Coordination   Initial Visit Note   01/25/2022 Name: Tina Horton MRN: [id number] DOB: 07/08/1965  Tina Horton is a 56 y.o. year old female who sees Tina Prose, MD for primary care. I spoke with  Tina Horton by phone today.  What matters to the patients health and wellness today?  I need a new cane for the blind and perhaps other services from the services for the blind. 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  SDOH assessments and interventions completed:  Yes  SDOH Interventions Today    Flowsheet Row Most Recent Value  SDOH Interventions   Food Insecurity Interventions Intervention Not Indicated  Transportation Interventions Intervention Not Indicated        Care Coordination Interventions Activated:  Yes  Care Coordination Interventions:  Yes, provided  Called the [facility] and left a message for Tina Horton to reach out to Tina Horton for her need of a new cane and any other thing she may be able to help her with. She reported she was having difficulty with cooking safely. Left my name and number with her and with Tina Horton for future needs.  Follow up plan: No further intervention required.   Encounter Outcome:  Pt. Visit Completed   Tina Horton C. Myrtie Neither, MSN, [facility] Gerontological Nurse Practitioner [facility] Care Management [phone]

## 2022-01-31 ENCOUNTER — Other Ambulatory Visit: Payer: Self-pay | Admitting: Cardiovascular Disease

## 2022-02-06 ENCOUNTER — Ambulatory Visit: Payer: Medicare HMO | Admitting: Neurology

## 2022-02-15 ENCOUNTER — Encounter: Payer: Self-pay | Admitting: Neurology

## 2022-02-15 ENCOUNTER — Ambulatory Visit: Payer: Medicare HMO | Admitting: Neurology

## 2022-02-15 VITALS — BP 164/86 | HR 71 | Ht 61.0 in | Wt 144.0 lb

## 2022-02-15 DIAGNOSIS — I639 Cerebral infarction, unspecified: Secondary | ICD-10-CM | POA: Diagnosis not present

## 2022-02-15 NOTE — Patient Instructions (Addendum)
It was great to see you today.  Follow-up with your primary care doctor about seeing a orthopaedic specialist for your right shoulder and trigger finger  Continue to see your cardiologist for high blood pressure  Return to clinic as needed

## 2022-02-15 NOTE — Progress Notes (Signed)
Follow-up Visit   Date: 02/15/22   Tina Horton MRN: [id number] DOB: 01-Mar-1966   Interim History: Tina Horton is a 56 y.o. right-handed female with insulin-dependent diabetes mellitus, hyperlipidemia, hypertension, severe myopia and glaucoma - essentially legally blind but able to see light OU presenting for evaluate of stroke returning to the clinic for follow-up of stroke.  The patient was accompanied to the clinic by husband who also provides collateral information.    History of present illness: She presented to the ER on 3/8 with imbalance and was discharged. On 3/10, she woke up with left arm and leg weakness with worsening imbalance.  She was taken to the ER where CT head showed acute right basal ganglia and corona radiata stroke.  MRI brain confirmed the presence of the stroke, MRA head and US carotid was normal.   NIHSS 2.  She did not receive tPA.  Etiology suspect to be small vessel disease. She was started on atorvastatin $RemoveBeforeD'80mg'URtoSCuRdOTBDm$  and dual antiplatelet therapy with aspirin $RemoveBefo'81mg'TAldsnCWHBz$  + plavix $Remove'75mg'UTqghAg$  daily.  She was taking aspirin $RemoveBefore'81mg'eXpKtyvvdBvkf$  previously.  Her blood pressure was noted to be elevated (170-180s).    Since she has been home, her left side weakness is improving.  She has some weakness with the left hand with coordination. She continues to have some imbalance and is very careful when walking.  At home she has someone next to her. She has not started PT yet.  She denies any numbness/tingling, dysphagia, or dysarthria.  Her vision is impaired at baseline, no new changes.   UPDATE 09/08/2020:  She is here because of complaints that her blood pressure was elevated (160s) and I encouraged her to discuss this with her nephrologist, who is managed her BP.  She has been started on clonidine which is better controlling her BP.  She is monitoring this multiple times a day, husband says even every hour.  She is very concerned that her elevated BP will result in another stroke.  She  denies any new neurological symptoms.  Her left sided weakness has markedly improved, she has slight weakness in the hand only.  Her balance is also improving.  She walks unassisted at home. She has completed PT and OT early after meeting goals.  She continues to use a transport chair for long distances.   UPDATE 02/05/2021:   She has some residual weakness in the right hand from her stroke, but overall feels very happy with her left side recovery.  Sometimes, her leg continues to have problems with coordination.  She reports having rare spells of imbalance about 2-3 times per week. She went to the ER last week because of headache and right ear pain.  CT head was negative for acute process.  She was given antibiotics for sinus infection.   UPDATE 02/15/2022:  She is here for follow-up visit.  She has been doing well from a neurological stand point.  Her blood pressure remains high, cardiology is aware.  She continues to have episodic spells of imbalance, fortunately no falls.  She also has sporadic headaches, none which are severe to treat as it is self-limited.   Medications:  Current Outpatient Medications on File Prior to Visit  Medication Sig Dispense Refill   Alcohol Swabs (B-D SINGLE USE SWABS REGULAR) PADS USE AS DIRECTED WHEN TESTING BLOOD SUGAR 300 each 0   aspirin EC 81 MG tablet Take 1 tablet (81 mg total) by mouth daily. 90 tablet 3   atorvastatin (  LIPITOR) 40 MG tablet TAKE 1 TABLET (40 MG TOTAL) BY MOUTH DAILY. 90 tablet 2   Blood Glucose Monitoring Suppl (PRODIGY AUTOCODE BLOOD GLUCOSE) w/Device KIT      carvedilol (COREG) 25 MG tablet TAKE 1 TABLET TWICE DAILY 180 tablet 3   cloNIDine (CATAPRES) 0.2 MG tablet TAKE 1 TABLET TWICE DAILY 180 tablet 3   glucose blood (PRODIGY NO CODING BLOOD GLUC) test strip Use as instructed 100 each 12   latanoprost (XALATAN) 0.005 % ophthalmic solution Place 1 drop into both eyes at bedtime.     losartan (COZAAR) 100 MG tablet Take 1 tablet (100 mg  total) by mouth daily. SCHEDULE OFFICE VISIT FOR FUTURE  REFILLS. 30 tablet 1   NOVOFINE PLUS PEN NEEDLE 32G X 4 MM MISC USE 4 TIMES A DAY WITH INSULIN INJECTIONS 100 each 0   NOVOLOG FLEXPEN 100 UNIT/ML FlexPen INJECT 20 UNITS SUBCUTANEOUSLY THREE TIMES DAILY WITH MEALS (USE SLIDING SCALE PROVIDED BY MD)(PEN EXPIRES AT 28 DAYS) (Patient taking differently: Inject 16 Units into the skin 3 (three) times daily with meals. Between 6-10 units 4-6 units  at bedtime) 60 mL 5   PRODIGY NO CODING BLOOD GLUC test strip TEST BLOOD SUGAR THREE TIMES DAILY 300 strip 0   Prodigy Twist Top Lancets 28G MISC USE TO TEST BLOOD SUGAR THREE TIMES DAILY 300 each 0   No current facility-administered medications on file prior to visit.    Allergies:  Allergies  Allergen Reactions   Amlodipine Swelling    Swelling    Metformin And Related Diarrhea and Nausea And Vomiting   Tanzeum [Albiglutide] Nausea And Vomiting   Lisinopril Other (See Comments)    Refuses, says it causes cancer    Vital Signs:  BP (!) 164/86   Pulse 71   Ht $R'5\' 1"'nP$  (1.549 m)   Wt 144 lb (65.3 kg)   LMP 09/13/2015   SpO2 99%   BMI 27.21 kg/m   Neurological Exam: MENTAL STATUS including orientation to time, place, person, recent and remote memory, attention span and concentration, language, and fund of knowledge is normal.  Speech is not dysarthric.  CRANIAL NERVES: Light perception only in the left eye, gross movements are perceivable in the right eye.  Pupils equal round and reactive to light.  Normal conjugate, extra-ocular eye movements in all directions of gaze.  No ptosis.  Face is symmetric.  MOTOR:  Motor strength is 5/5 in all extremities, except trace left hand weakness with grip.  No atrophy, fasciculations or abnormal movements.  No pronator drift.     MSRs:  Reflexes are 2+/4 throughout.  SENSORY:  Intact to vibration throughout.  COORDINATION/GAIT:  Normal finger-to- nose-finger.  Intact rapid alternating movements  bilaterally. Unassisted gait appears slow, steady.  Data:  CT head 07/27/20: Acute infarct in the right basal ganglia, extending into overlying corona radiata. Mild edema without mass effect.    MRI head 07/27/20:   1. 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 Associated edema without mass effect. 2. Bilateral posterior staphylomas.   MRA head 07/27/2020:    Normal intracranial MR angiography.   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  Etiology - small vessel disease. Management remains addressing secondary risk factors - diabetes, hyperlipidemia, and hypertension  - Continue aspirin 81mg  daily  - Continue atorvastatin 40mg  daily  - Blood pressure has been refractory - I have asked her to monitor at home and follow-up with cardiology  Tension headaches, monitor.  Return to clinic as needed  Thank you for allowing me to participate in patient's care.  If I can answer any additional questions, I would be pleased to do so.    Sincerely,    Derrall Hicks K. Posey Pronto, DO

## 2022-02-21 DIAGNOSIS — H547 Unspecified visual loss: Secondary | ICD-10-CM | POA: Diagnosis not present

## 2022-02-21 DIAGNOSIS — E1169 Type 2 diabetes mellitus with other specified complication: Secondary | ICD-10-CM | POA: Diagnosis not present

## 2022-02-21 DIAGNOSIS — E1159 Type 2 diabetes mellitus with other circulatory complications: Secondary | ICD-10-CM | POA: Diagnosis not present

## 2022-02-21 DIAGNOSIS — E1129 Type 2 diabetes mellitus with other diabetic kidney complication: Secondary | ICD-10-CM | POA: Diagnosis not present

## 2022-02-21 DIAGNOSIS — Z794 Long term (current) use of insulin: Secondary | ICD-10-CM | POA: Diagnosis not present

## 2022-02-21 DIAGNOSIS — I152 Hypertension secondary to endocrine disorders: Secondary | ICD-10-CM | POA: Diagnosis not present

## 2022-02-21 DIAGNOSIS — R809 Proteinuria, unspecified: Secondary | ICD-10-CM | POA: Diagnosis not present

## 2022-02-21 DIAGNOSIS — E785 Hyperlipidemia, unspecified: Secondary | ICD-10-CM | POA: Diagnosis not present

## 2022-03-05 DIAGNOSIS — H442A3 Degenerative myopia with choroidal neovascularization, bilateral eye: Secondary | ICD-10-CM | POA: Diagnosis not present

## 2022-03-05 DIAGNOSIS — Z794 Long term (current) use of insulin: Secondary | ICD-10-CM | POA: Diagnosis not present

## 2022-03-05 DIAGNOSIS — H401134 Primary open-angle glaucoma, bilateral, indeterminate stage: Secondary | ICD-10-CM | POA: Diagnosis not present

## 2022-03-05 DIAGNOSIS — E119 Type 2 diabetes mellitus without complications: Secondary | ICD-10-CM | POA: Diagnosis not present

## 2022-03-05 DIAGNOSIS — H2513 Age-related nuclear cataract, bilateral: Secondary | ICD-10-CM | POA: Diagnosis not present

## 2022-03-06 ENCOUNTER — Other Ambulatory Visit: Payer: Self-pay | Admitting: Cardiovascular Disease

## 2022-03-11 DIAGNOSIS — I10 Essential (primary) hypertension: Secondary | ICD-10-CM | POA: Diagnosis not present

## 2022-03-11 DIAGNOSIS — J309 Allergic rhinitis, unspecified: Secondary | ICD-10-CM | POA: Diagnosis not present

## 2022-03-19 DIAGNOSIS — E1129 Type 2 diabetes mellitus with other diabetic kidney complication: Secondary | ICD-10-CM | POA: Diagnosis not present

## 2022-04-14 ENCOUNTER — Other Ambulatory Visit: Payer: Self-pay | Admitting: Cardiovascular Disease

## 2022-04-30 ENCOUNTER — Ambulatory Visit: Payer: Medicare HMO | Attending: Cardiovascular Disease | Admitting: Cardiovascular Disease

## 2022-04-30 ENCOUNTER — Encounter: Payer: Self-pay | Admitting: Cardiovascular Disease

## 2022-04-30 VITALS — BP 128/80 | HR 83 | Ht 61.0 in | Wt 142.0 lb

## 2022-04-30 DIAGNOSIS — E782 Mixed hyperlipidemia: Secondary | ICD-10-CM

## 2022-04-30 DIAGNOSIS — I10 Essential (primary) hypertension: Secondary | ICD-10-CM | POA: Diagnosis not present

## 2022-04-30 DIAGNOSIS — R011 Cardiac murmur, unspecified: Secondary | ICD-10-CM | POA: Diagnosis not present

## 2022-04-30 NOTE — Progress Notes (Signed)
   04/30/2022 Tina Horton   04/11/1966  [id number]  Primary Physician Sun, Vyvyan, MD Primary Cardiologist: Jonathan J Berry MD FACP, FACC, FAHA, FSCAI  HPI:  Tina Horton is a 56 y.o.  moderately overweight married African American female mother of 3 who I last saw in the office 03/07/2021.  She is accompanied by her husband Tina Horton. She was referred for preoperative clearance before elective hysterectomy. 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 Her cardiac risk factor profile is notable for treated hypertension, diabetes and hyperlipidemia. She has never had a heart attack or stroke. She denies chest pain or shortness of breath. She does have a murmur apparently.  I obtain a 2D echocardiogram on 09/04/2015 which was entirely normal.  Routine GXT done for preoperative clearance for a hysterectomy was normal as well.   She did have COVID-19 in September of l 2021 and has had difficult blood pressure management since that time.  She was admitted to the hospital the day after I saw her with a stroke.  She was having some unsteadiness of gait.  The MRI showed a right corona radiata/infarct.  2D echo was unrevealing at work as were carotid Dopplers.  She wore an event monitor that showed no A. fib.  Her carvedilol was uptitrated to 25 mg p.o. twice daily.  She was also started on high-dose atorvastatin.  Since I saw her a year ago she is remained stable.  Her blood pressures are under good control.  She denies chest pain or shortness of breath.  Her balance and left upper extremity motor deficits have resolved since I last saw her.  She does get occasional palpitations.   Current Meds  Medication Sig   Alcohol Swabs (B-D SINGLE USE SWABS REGULAR) PADS USE AS DIRECTED WHEN TESTING BLOOD SUGAR   aspirin EC 81 MG tablet Take 1 tablet (81 mg total) by mouth daily.   atorvastatin (LIPITOR) 40 MG tablet TAKE 1 TABLET EVERY DAY    Blood Glucose Monitoring Suppl (PRODIGY AUTOCODE BLOOD GLUCOSE) w/Device KIT    carvedilol (COREG) 25 MG tablet TAKE 1 TABLET TWICE DAILY   cloNIDine (CATAPRES) 0.2 MG tablet TAKE 1 TABLET TWICE DAILY   glucose blood (PRODIGY NO CODING BLOOD GLUC) test strip Use as instructed   Insulin Degludec-Liraglutide (XULTOPHY  [place]) Inject 10 Units into the skin every morning.   latanoprost (XALATAN) 0.005 % ophthalmic solution Place 1 drop into both eyes at bedtime.   losartan (COZAAR) 100 MG tablet TAKE 1 TABLET EVERY DAY (SCHEDULE OFFICE VISIT FOR FUTURE REFILLS)   NOVOFINE PLUS PEN NEEDLE 32G X 4 MM MISC USE 4 TIMES A DAY WITH INSULIN INJECTIONS   PRODIGY NO CODING BLOOD GLUC test strip TEST BLOOD SUGAR THREE TIMES DAILY   Prodigy Twist Top Lancets 28G MISC USE TO TEST BLOOD SUGAR THREE TIMES DAILY     Allergies  Allergen Reactions   Amlodipine Swelling    Swelling    Metformin And Related Diarrhea and Nausea And Vomiting   Tanzeum [Albiglutide] Nausea And Vomiting   Lisinopril Other (See Comments)    Refuses, says it causes cancer    Social History   Socioeconomic History   Marital status: Married    Spouse name: Not on file   Number of children: Not on file   Years of education: Not on file   Highest education level: Not on file    Occupational History   Not on file  Tobacco Use   Smoking status: Never   Smokeless tobacco: Never  Vaping Use   Vaping Use: Never used  Substance and Sexual Activity   Alcohol use: No    Alcohol/week: 0.0 standard drinks of alcohol   Drug use: No   Sexual activity: Not on file  Other Topics Concern   Not on file  Social History Narrative   Married, has 3 children, not exercising, but walks at work.     Lives in a one story home.     On disability.  Education: some college.      Update: No longer working, active in the home. Still on disability   Right handed    Social Determinants of Health   Financial Resource Strain: Not on file  Food  Insecurity: No Food Insecurity (01/25/2022)   Hunger Vital Sign    Worried About Running Out of Food in the Last Year: Never true    Ran Out of Food in the Last Year: Never true  Transportation Needs: No Transportation Needs (01/25/2022)   PRAPARE - Hydrologist (Medical): No    Lack of Transportation (Non-Medical): No  Physical Activity: Not on file  Stress: Not on file  Social Connections: Not on file  Intimate Partner Violence: Not on file     Review of Systems: General: negative for chills, fever, night sweats or weight changes.  Cardiovascular: negative for chest pain, dyspnea on exertion, edema, orthopnea, palpitations, paroxysmal nocturnal dyspnea or shortness of breath Dermatological: negative for rash Respiratory: negative for cough or wheezing Urologic: negative for hematuria Abdominal: negative for nausea, vomiting, diarrhea, bright red blood per rectum, melena, or hematemesis Neurologic: negative for visual changes, syncope, or dizziness All other systems reviewed and are otherwise negative except as noted above.    Blood pressure 128/80, pulse 83, height 5' 1" (1.549 m), weight 142 lb (64.4 kg), last menstrual period 09/13/2015.  General appearance: alert and no distress Neck: no adenopathy, no carotid bruit, no JVD, supple, symmetrical, trachea midline, and thyroid not enlarged, symmetric, no tenderness/mass/nodules Lungs: clear to auscultation bilaterally Heart: regular rate and rhythm, S1, S2 normal, no murmur, click, rub or gallop Extremities: extremities normal, atraumatic, no cyanosis or edema Pulses: 2+ and symmetric Skin: Skin color, texture, turgor normal. No rashes or lesions Neurologic: Grossly normal  EKG sinus rhythm at 83 with poor R wave progression and nonspecific ST and T wave changes.  I personally reviewed this EKG.  ASSESSMENT AND PLAN:   Essential hypertension History of essential hypertension a blood pressure measured  Horton at 128/80.  She is on carvedilol, clonidine and losartan.  Hyperlipidemia History of hyperlipidemia on statin therapy with lipid profile performed 11/22/2020 revealing total cholesterol 131, LDL 64 and HDL 51.  Heart murmur History of heart murmur with 2D echo performed 4//22 that showed no evidence of valvular heart disease.     Lorretta Harp MD FACP,FACC,FAHA, [facility] 04/30/2022 10:07 AM

## 2022-04-30 NOTE — Assessment & Plan Note (Signed)
History of hyperlipidemia on statin therapy with lipid profile performed 11/22/2020 revealing total cholesterol 131, LDL 64 and HDL 51.

## 2022-04-30 NOTE — Patient Instructions (Signed)
Medication Instructions:  Your physician recommends that you continue on your current medications as directed. Please refer to the Current Medication list given to you today.  *If you need a refill on your cardiac medications before your next appointment, please call your pharmacy*   Follow-Up: At [facility], you and your health needs are our priority.  As part of our continuing mission to provide you with exceptional heart care, we have created designated Provider Care Teams.  These Care Teams include your primary Cardiologist (physician) and Advanced Practice Providers (APPs -  Physician Assistants and Nurse Practitioners) who all work together to provide you with the care you need, when you need it.  We recommend signing up for the patient portal called "MyChart".  Sign up information is provided on this After Visit Summary.  MyChart is used to connect with patients for Virtual Visits (Telemedicine).  Patients are able to view lab/test results, encounter notes, upcoming appointments, etc.  Non-urgent messages can be sent to your provider as well.   To learn more about what you can do with MyChart, go to [email or web address].    Your next appointment:   We will see you on an as needed basis.  Provider:   Jonathan Berry, MD  

## 2022-04-30 NOTE — Assessment & Plan Note (Signed)
History of heart murmur with 2D echo performed 4//22 that showed no evidence of valvular heart disease.

## 2022-04-30 NOTE — Assessment & Plan Note (Signed)
History of essential hypertension a blood pressure measured today at 128/80.  She is on carvedilol, clonidine and losartan.

## 2022-05-16 ENCOUNTER — Other Ambulatory Visit: Payer: Self-pay | Admitting: Cardiovascular Disease

## 2022-05-27 ENCOUNTER — Ambulatory Visit: Payer: Medicare HMO | Admitting: Neurology

## 2022-05-30 DIAGNOSIS — E785 Hyperlipidemia, unspecified: Secondary | ICD-10-CM | POA: Diagnosis not present

## 2022-05-30 DIAGNOSIS — E1129 Type 2 diabetes mellitus with other diabetic kidney complication: Secondary | ICD-10-CM | POA: Diagnosis not present

## 2022-05-30 DIAGNOSIS — I152 Hypertension secondary to endocrine disorders: Secondary | ICD-10-CM | POA: Diagnosis not present

## 2022-05-30 DIAGNOSIS — E1159 Type 2 diabetes mellitus with other circulatory complications: Secondary | ICD-10-CM | POA: Diagnosis not present

## 2022-05-30 DIAGNOSIS — H547 Unspecified visual loss: Secondary | ICD-10-CM | POA: Diagnosis not present

## 2022-05-30 DIAGNOSIS — E1169 Type 2 diabetes mellitus with other specified complication: Secondary | ICD-10-CM | POA: Diagnosis not present

## 2022-05-30 DIAGNOSIS — R809 Proteinuria, unspecified: Secondary | ICD-10-CM | POA: Diagnosis not present

## 2022-05-30 DIAGNOSIS — Z794 Long term (current) use of insulin: Secondary | ICD-10-CM | POA: Diagnosis not present

## 2022-06-17 DIAGNOSIS — R8290 Unspecified abnormal findings in urine: Secondary | ICD-10-CM | POA: Diagnosis not present

## 2022-06-17 DIAGNOSIS — E1129 Type 2 diabetes mellitus with other diabetic kidney complication: Secondary | ICD-10-CM | POA: Diagnosis not present

## 2022-06-17 DIAGNOSIS — R109 Unspecified abdominal pain: Secondary | ICD-10-CM | POA: Diagnosis not present

## 2022-06-17 DIAGNOSIS — M62830 Muscle spasm of back: Secondary | ICD-10-CM | POA: Diagnosis not present

## 2022-06-17 DIAGNOSIS — M545 Low back pain, unspecified: Secondary | ICD-10-CM | POA: Diagnosis not present

## 2022-08-10 ENCOUNTER — Other Ambulatory Visit: Payer: Self-pay | Admitting: Cardiovascular Disease

## 2022-08-20 DIAGNOSIS — H401134 Primary open-angle glaucoma, bilateral, indeterminate stage: Secondary | ICD-10-CM | POA: Diagnosis not present

## 2022-08-20 DIAGNOSIS — E119 Type 2 diabetes mellitus without complications: Secondary | ICD-10-CM | POA: Diagnosis not present

## 2022-08-20 DIAGNOSIS — H15833 Staphyloma posticum, bilateral: Secondary | ICD-10-CM | POA: Diagnosis not present

## 2022-09-03 DIAGNOSIS — H04123 Dry eye syndrome of bilateral lacrimal glands: Secondary | ICD-10-CM | POA: Diagnosis not present

## 2022-09-03 DIAGNOSIS — H2513 Age-related nuclear cataract, bilateral: Secondary | ICD-10-CM | POA: Diagnosis not present

## 2022-09-03 DIAGNOSIS — H442A3 Degenerative myopia with choroidal neovascularization, bilateral eye: Secondary | ICD-10-CM | POA: Diagnosis not present

## 2022-09-03 DIAGNOSIS — E119 Type 2 diabetes mellitus without complications: Secondary | ICD-10-CM | POA: Diagnosis not present

## 2022-09-03 DIAGNOSIS — H401134 Primary open-angle glaucoma, bilateral, indeterminate stage: Secondary | ICD-10-CM | POA: Diagnosis not present

## 2022-09-16 DIAGNOSIS — E1129 Type 2 diabetes mellitus with other diabetic kidney complication: Secondary | ICD-10-CM | POA: Diagnosis not present

## 2022-10-25 DIAGNOSIS — N189 Chronic kidney disease, unspecified: Secondary | ICD-10-CM | POA: Diagnosis not present

## 2022-10-25 DIAGNOSIS — I129 Hypertensive chronic kidney disease with stage 1 through stage 4 chronic kidney disease, or unspecified chronic kidney disease: Secondary | ICD-10-CM | POA: Diagnosis not present

## 2022-10-25 DIAGNOSIS — D631 Anemia in chronic kidney disease: Secondary | ICD-10-CM | POA: Diagnosis not present

## 2022-10-25 DIAGNOSIS — E1122 Type 2 diabetes mellitus with diabetic chronic kidney disease: Secondary | ICD-10-CM | POA: Diagnosis not present

## 2022-10-25 DIAGNOSIS — E559 Vitamin D deficiency, unspecified: Secondary | ICD-10-CM | POA: Diagnosis not present

## 2022-10-25 DIAGNOSIS — R809 Proteinuria, unspecified: Secondary | ICD-10-CM | POA: Diagnosis not present

## 2022-10-25 DIAGNOSIS — N182 Chronic kidney disease, stage 2 (mild): Secondary | ICD-10-CM | POA: Diagnosis not present

## 2022-11-13 DIAGNOSIS — Z794 Long term (current) use of insulin: Secondary | ICD-10-CM | POA: Diagnosis not present

## 2022-11-13 DIAGNOSIS — I152 Hypertension secondary to endocrine disorders: Secondary | ICD-10-CM | POA: Diagnosis not present

## 2022-11-13 DIAGNOSIS — E785 Hyperlipidemia, unspecified: Secondary | ICD-10-CM | POA: Diagnosis not present

## 2022-11-13 DIAGNOSIS — E1169 Type 2 diabetes mellitus with other specified complication: Secondary | ICD-10-CM | POA: Diagnosis not present

## 2022-11-13 DIAGNOSIS — E1129 Type 2 diabetes mellitus with other diabetic kidney complication: Secondary | ICD-10-CM | POA: Diagnosis not present

## 2022-11-13 DIAGNOSIS — E1159 Type 2 diabetes mellitus with other circulatory complications: Secondary | ICD-10-CM | POA: Diagnosis not present

## 2022-11-13 DIAGNOSIS — R809 Proteinuria, unspecified: Secondary | ICD-10-CM | POA: Diagnosis not present

## 2022-12-06 ENCOUNTER — Telehealth: Payer: Self-pay | Admitting: Cardiovascular Disease

## 2022-12-06 NOTE — Telephone Encounter (Signed)
Pt c/o medication issue:  1. Name of Medication:   cloNIDine (CATAPRES) 0.2 MG tablet   2. How are you currently taking this medication (dosage and times per day)?   As prescribed  3. Are you having a reaction (difficulty breathing--STAT)?   4. What is your medication issue?   Caller stated patient has been very sleepy on this medication and want to know if patient can get Clonidine in a patch which has lighter side effect. Caller stated can call the patient directly with the medication change.

## 2022-12-06 NOTE — Telephone Encounter (Signed)
Left voicemail for patient return call to office

## 2022-12-09 NOTE — Telephone Encounter (Signed)
Pt is returning call and is requesting a callback. She also stated she spoke with her PCP and they'd like her to try the Clonidine Patch but she'd like to see if its okay with MD. Please advise

## 2022-12-09 NOTE — Telephone Encounter (Signed)
Patient states her PCP would like your opinion on her starting the clonidine patch. She states the pill is causing her to be fatigued and her PCP stated the patch does not have the same  side effects

## 2022-12-11 NOTE — Telephone Encounter (Signed)
Patient aware its ok for her to try clonidine patch.

## 2022-12-15 DIAGNOSIS — E1129 Type 2 diabetes mellitus with other diabetic kidney complication: Secondary | ICD-10-CM | POA: Diagnosis not present

## 2023-01-10 DIAGNOSIS — I10 Essential (primary) hypertension: Secondary | ICD-10-CM | POA: Diagnosis not present

## 2023-01-10 DIAGNOSIS — Z0000 Encounter for general adult medical examination without abnormal findings: Secondary | ICD-10-CM | POA: Diagnosis not present

## 2023-01-10 DIAGNOSIS — H409 Unspecified glaucoma: Secondary | ICD-10-CM | POA: Diagnosis not present

## 2023-01-10 DIAGNOSIS — Z1211 Encounter for screening for malignant neoplasm of colon: Secondary | ICD-10-CM | POA: Diagnosis not present

## 2023-01-10 DIAGNOSIS — Z1331 Encounter for screening for depression: Secondary | ICD-10-CM | POA: Diagnosis not present

## 2023-01-10 DIAGNOSIS — E1165 Type 2 diabetes mellitus with hyperglycemia: Secondary | ICD-10-CM | POA: Diagnosis not present

## 2023-01-10 DIAGNOSIS — I700 Atherosclerosis of aorta: Secondary | ICD-10-CM | POA: Diagnosis not present

## 2023-01-10 DIAGNOSIS — I69354 Hemiplegia and hemiparesis following cerebral infarction affecting left non-dominant side: Secondary | ICD-10-CM | POA: Diagnosis not present

## 2023-01-10 DIAGNOSIS — E785 Hyperlipidemia, unspecified: Secondary | ICD-10-CM | POA: Diagnosis not present

## 2023-01-20 ENCOUNTER — Other Ambulatory Visit: Payer: Self-pay | Admitting: Cardiovascular Disease

## 2023-03-02 ENCOUNTER — Other Ambulatory Visit: Payer: Self-pay | Admitting: Cardiovascular Disease

## 2023-03-15 DIAGNOSIS — E1129 Type 2 diabetes mellitus with other diabetic kidney complication: Secondary | ICD-10-CM | POA: Diagnosis not present

## 2023-03-19 ENCOUNTER — Other Ambulatory Visit: Payer: Self-pay | Admitting: Cardiovascular Disease

## 2023-04-01 DIAGNOSIS — H15833 Staphyloma posticum, bilateral: Secondary | ICD-10-CM | POA: Diagnosis not present

## 2023-04-01 DIAGNOSIS — E119 Type 2 diabetes mellitus without complications: Secondary | ICD-10-CM | POA: Diagnosis not present

## 2023-04-01 DIAGNOSIS — H2513 Age-related nuclear cataract, bilateral: Secondary | ICD-10-CM | POA: Diagnosis not present

## 2023-04-01 DIAGNOSIS — H401134 Primary open-angle glaucoma, bilateral, indeterminate stage: Secondary | ICD-10-CM | POA: Diagnosis not present

## 2023-04-01 DIAGNOSIS — E1136 Type 2 diabetes mellitus with diabetic cataract: Secondary | ICD-10-CM | POA: Diagnosis not present

## 2023-04-25 ENCOUNTER — Other Ambulatory Visit: Payer: Self-pay | Admitting: Cardiovascular Disease

## 2023-05-16 ENCOUNTER — Other Ambulatory Visit: Payer: Self-pay | Admitting: Cardiovascular Disease

## 2023-05-23 ENCOUNTER — Other Ambulatory Visit (HOSPITAL_COMMUNITY): Payer: Self-pay

## 2023-05-25 ENCOUNTER — Other Ambulatory Visit (HOSPITAL_COMMUNITY): Payer: Self-pay

## 2023-05-26 ENCOUNTER — Other Ambulatory Visit (HOSPITAL_COMMUNITY): Payer: Self-pay

## 2023-05-26 MED ORDER — BRIMONIDINE TARTRATE 0.2 % OP SOLN
1.0000 [drp] | Freq: Three times a day (TID) | OPHTHALMIC | 3 refills | Status: DC
  Filled 2023-06-25: qty 5, 34d supply, fill #0

## 2023-05-26 MED ORDER — INSULIN ASPART 100 UNIT/ML FLEXPEN: 10.0000 [IU] | PEN_INJECTOR | Freq: Three times a day (TID) | SUBCUTANEOUS | 3 refills | Status: DC

## 2023-05-26 MED ORDER — ALCOHOL SWABS 70 % PADS
MEDICATED_PAD | Freq: Four times a day (QID) | 3 refills | Status: DC
  Filled 2023-06-25: qty 400, 100d supply, fill #0

## 2023-05-26 MED ORDER — INSULIN PEN NEEDLE 32G X 4 MM MISC
Freq: Four times a day (QID) | 3 refills | Status: DC
  Filled 2023-06-25: qty 400, 100d supply, fill #0
  Filled 2023-12-31 (×2): qty 400, 100d supply, fill #1
  Filled 2024-04-03: qty 400, 100d supply, fill #2

## 2023-05-26 MED ORDER — DORZOLAMIDE HCL-TIMOLOL MAL 2-0.5 % OP SOLN
1.0000 [drp] | Freq: Two times a day (BID) | OPHTHALMIC | 1 refills | Status: DC
  Filled 2023-06-25: qty 10, 100d supply, fill #0
  Filled 2023-12-31 (×2): qty 10, 100d supply, fill #1

## 2023-05-26 MED ORDER — INSULIN DEGLUDEC 100 UNIT/ML ~~LOC~~ SOPN
12.0000 [IU] | PEN_INJECTOR | Freq: Every day | SUBCUTANEOUS | 3 refills | Status: DC
  Filled 2023-09-04: qty 30, 100d supply, fill #0

## 2023-05-26 MED ORDER — GLUCOSE BLOOD VI STRP
ORAL_STRIP | Freq: Three times a day (TID) | 3 refills | Status: AC
  Filled 2023-06-25: qty 300, 100d supply, fill #0
  Filled 2023-06-26: qty 50, 25d supply, fill #0
  Filled 2023-08-29: qty 300, 100d supply, fill #0
  Filled 2023-12-31 (×2): qty 300, fill #0
  Filled 2024-01-01: qty 300, 90d supply, fill #0
  Filled 2024-01-02: qty 300, fill #0

## 2023-05-27 ENCOUNTER — Other Ambulatory Visit (HOSPITAL_COMMUNITY): Payer: Self-pay

## 2023-05-28 ENCOUNTER — Other Ambulatory Visit (HOSPITAL_COMMUNITY): Payer: Self-pay

## 2023-05-29 ENCOUNTER — Other Ambulatory Visit (HOSPITAL_COMMUNITY): Payer: Self-pay

## 2023-05-29 MED ORDER — ONETOUCH DELICA PLUS LANCET33G MISC
1.0000 | Freq: Three times a day (TID) | 3 refills | Status: AC
  Filled 2023-06-25 – 2023-06-26 (×2): qty 300, 100d supply, fill #0
  Filled 2023-12-31 (×2): qty 300, 100d supply, fill #1

## 2023-05-29 MED ORDER — ASPIRIN 81 MG PO TBEC
81.0000 mg | DELAYED_RELEASE_TABLET | Freq: Every day | ORAL | 3 refills | Status: DC
  Filled 2023-06-25: qty 90, 90d supply, fill #0
  Filled 2023-09-18: qty 90, 90d supply, fill #1
  Filled 2023-12-31 (×2): qty 90, 90d supply, fill #2

## 2023-05-30 ENCOUNTER — Other Ambulatory Visit (HOSPITAL_COMMUNITY): Payer: Self-pay

## 2023-06-04 DIAGNOSIS — I152 Hypertension secondary to endocrine disorders: Secondary | ICD-10-CM | POA: Diagnosis not present

## 2023-06-04 DIAGNOSIS — E785 Hyperlipidemia, unspecified: Secondary | ICD-10-CM | POA: Diagnosis not present

## 2023-06-04 DIAGNOSIS — Z794 Long term (current) use of insulin: Secondary | ICD-10-CM | POA: Diagnosis not present

## 2023-06-04 DIAGNOSIS — E1159 Type 2 diabetes mellitus with other circulatory complications: Secondary | ICD-10-CM | POA: Diagnosis not present

## 2023-06-04 DIAGNOSIS — E1169 Type 2 diabetes mellitus with other specified complication: Secondary | ICD-10-CM | POA: Diagnosis not present

## 2023-06-04 DIAGNOSIS — R809 Proteinuria, unspecified: Secondary | ICD-10-CM | POA: Diagnosis not present

## 2023-06-04 DIAGNOSIS — E1129 Type 2 diabetes mellitus with other diabetic kidney complication: Secondary | ICD-10-CM | POA: Diagnosis not present

## 2023-06-16 IMAGING — CT CT HEAD W/O CM
2 of 3 series · 15 of 37 positions shown, 18 images · non-contrast
Comparison: 07/27/2020

CLINICAL DATA: Headache, dizziness

EXAM:
CT HEAD WITHOUT CONTRAST
TECHNIQUE: Contiguous axial images were obtained from the base of the skull
through the vertex without intravenous contrast.

[Series 3: head 2.0 h70h · axial · 0.42mm/px · z∈[-116,+28]mm · 12 of 80 slices shown, 15 images]
[im 4/80  brain]
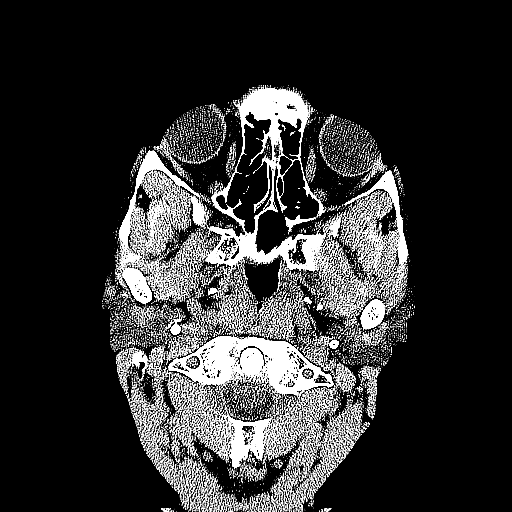
[im 4/80  bone]
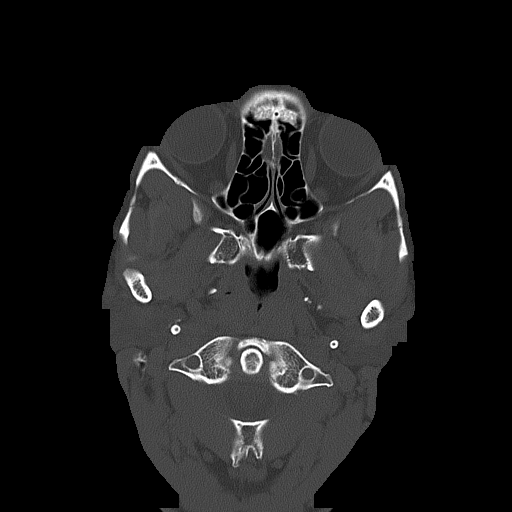
[im 12/80  brain]
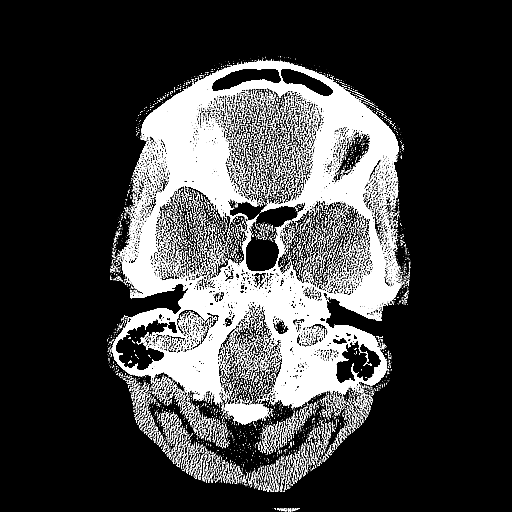
[im 16/80  brain]
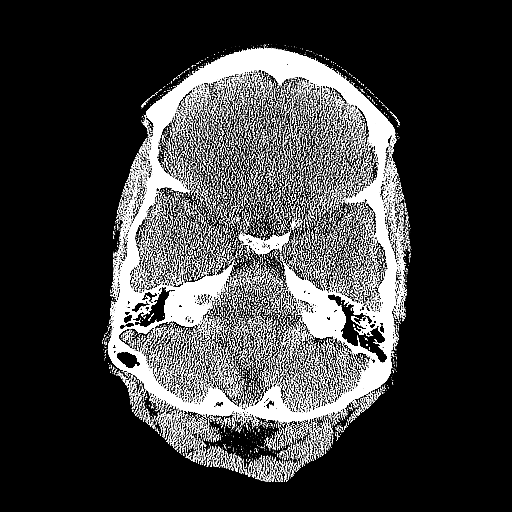
[im 24/80  brain]
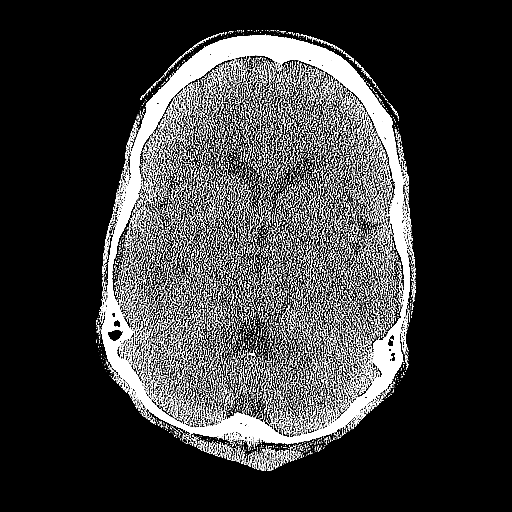
[im 32/80  brain]
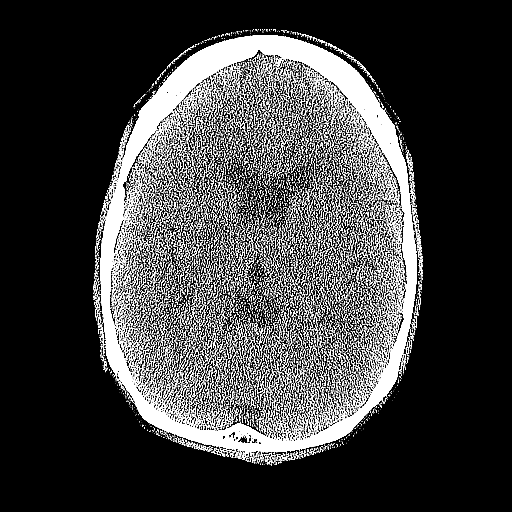
[im 32/80  bone]
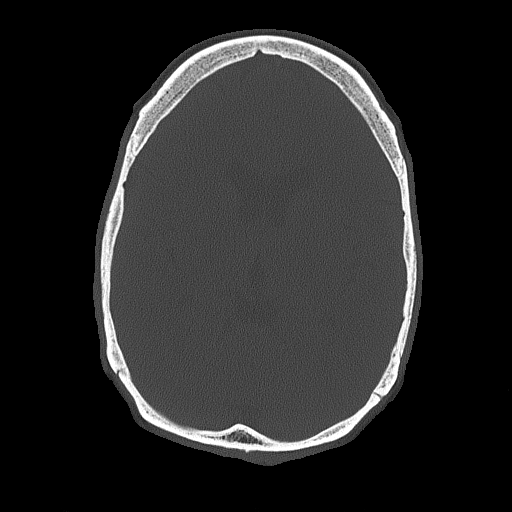
[im 36/80  brain]
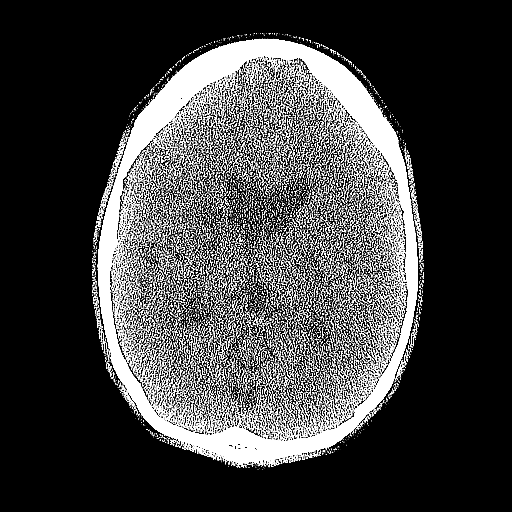
[im 44/80  brain]
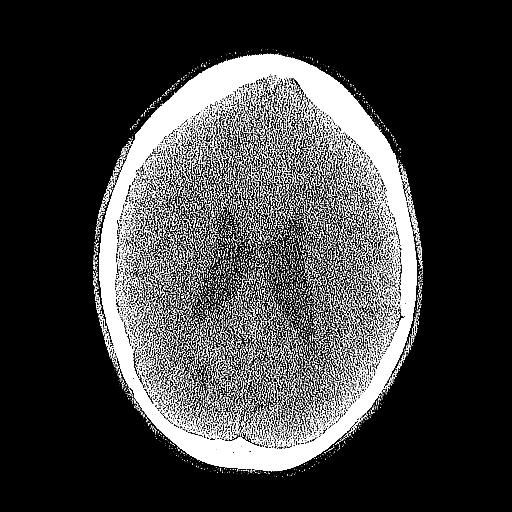
[im 48/80  brain]
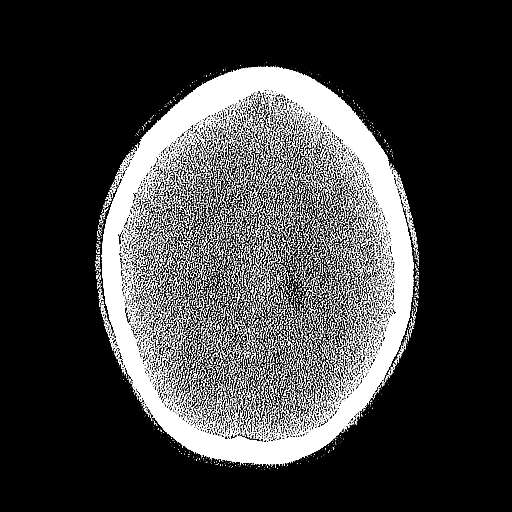
[im 56/80  brain]
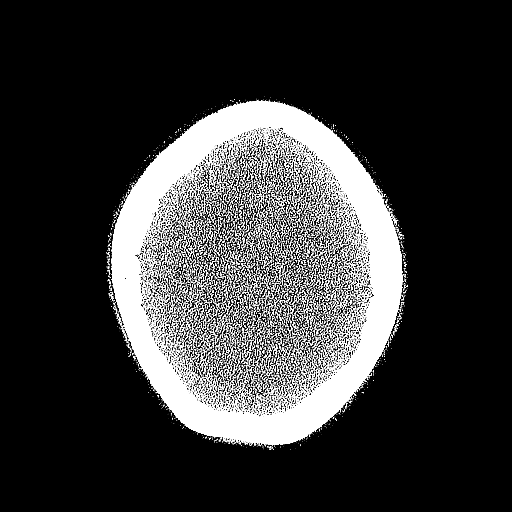
[im 56/80  bone]
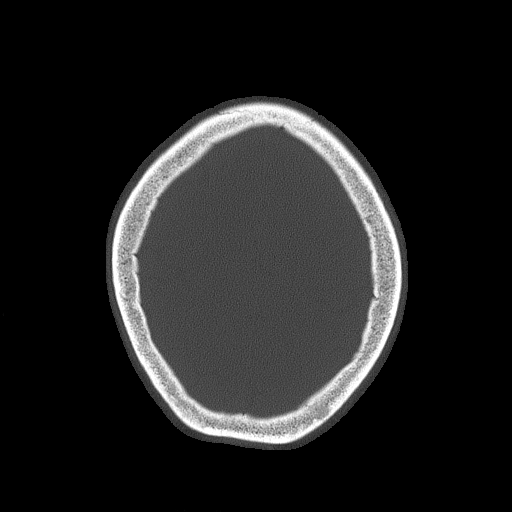
[im 64/80  brain]
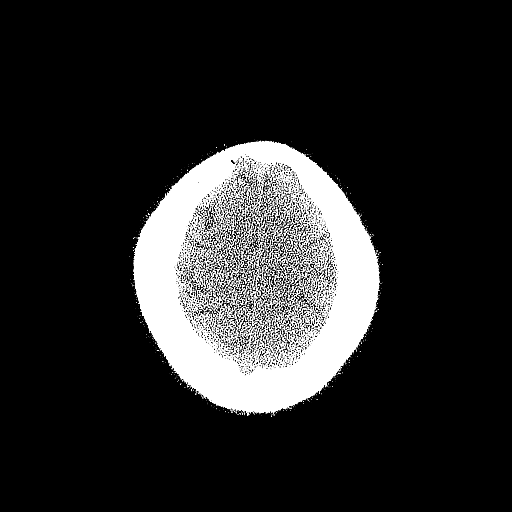
[im 68/80  brain]
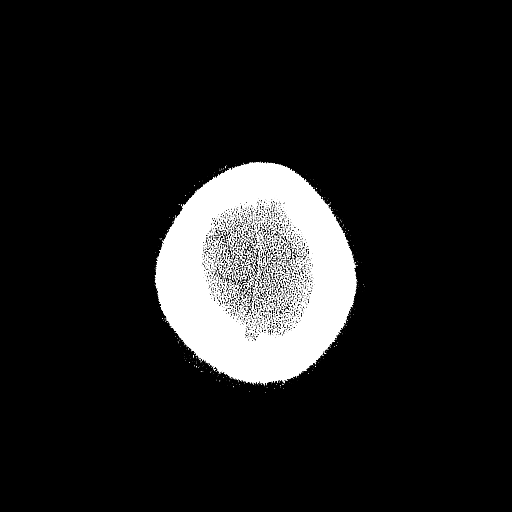
[im 76/80  brain]
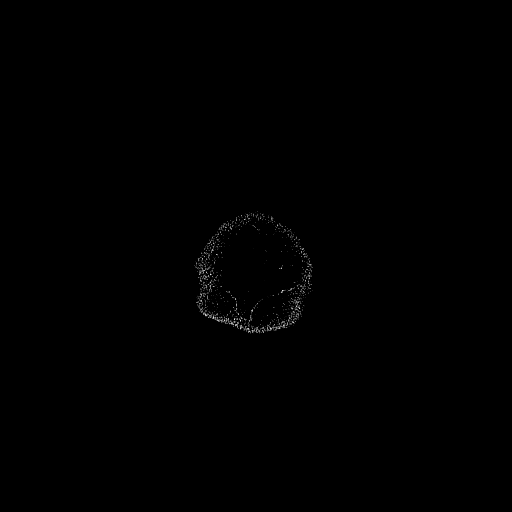

[Series 6: head 3.0 mpr sag · sagittal · 0.32mm/px · 3 of 54 slices shown]
[im 18/54  brain]
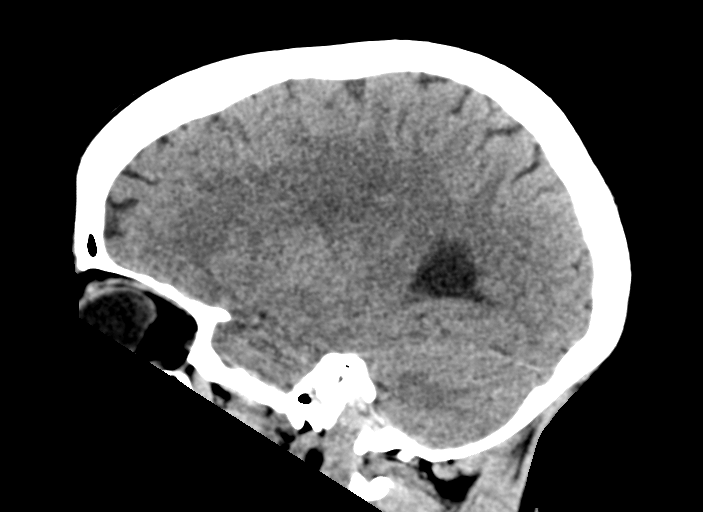
[im 27/54  brain]
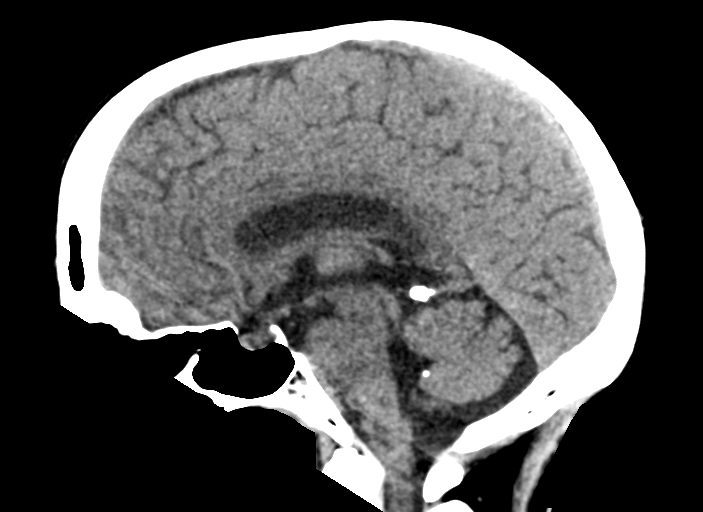
[im 36/54  brain]
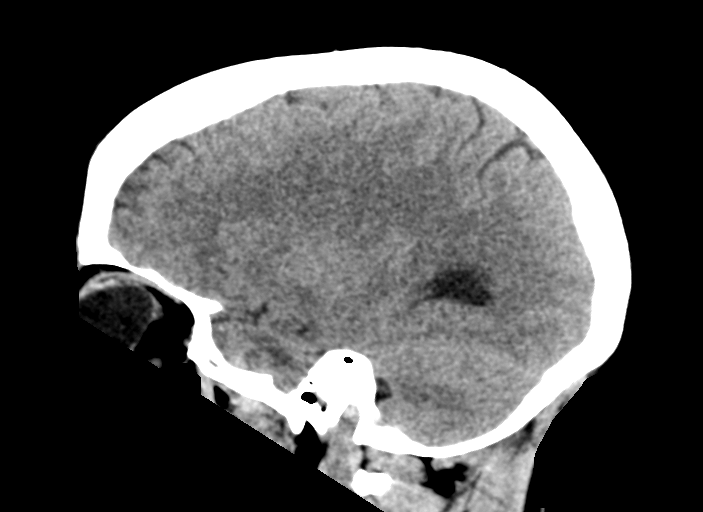

[15 of 37 positions shown; findings below may reference images not displayed]

FINDINGS: Brain: No evidence of acute infarction, hemorrhage, hydrocephalus,
extra-axial collection or mass lesion/mass effect.

Right basal ganglia lacunar infarct.

Vascular: No hyperdense vessel or unexpected calcification.

Skull: Normal. Negative for fracture or focal lesion.

Sinuses/Orbits: The visualized paranasal sinuses are essentially
clear. The mastoid air cells are unopacified.

Other: None.
IMPRESSION: No evidence of acute intracranial abnormality.

Right basal ganglia lacunar infarct.

## 2023-06-26 ENCOUNTER — Other Ambulatory Visit: Payer: Self-pay

## 2023-06-26 ENCOUNTER — Other Ambulatory Visit (HOSPITAL_COMMUNITY): Payer: Self-pay

## 2023-06-27 ENCOUNTER — Other Ambulatory Visit: Payer: Self-pay

## 2023-06-27 ENCOUNTER — Other Ambulatory Visit (HOSPITAL_COMMUNITY): Payer: Self-pay

## 2023-06-30 ENCOUNTER — Emergency Department (HOSPITAL_BASED_OUTPATIENT_CLINIC_OR_DEPARTMENT_OTHER)
Admission: EM | Admit: 2023-06-30 | Discharge: 2023-06-30 | Disposition: A | Discharge status: Home / Self Care | Payer: PPO | Attending: Emergency Medicine | Admitting: Emergency Medicine

## 2023-06-30 ENCOUNTER — Encounter (HOSPITAL_BASED_OUTPATIENT_CLINIC_OR_DEPARTMENT_OTHER): Payer: Self-pay | Admitting: Emergency Medicine

## 2023-06-30 ENCOUNTER — Other Ambulatory Visit (HOSPITAL_BASED_OUTPATIENT_CLINIC_OR_DEPARTMENT_OTHER): Payer: Self-pay

## 2023-06-30 ENCOUNTER — Other Ambulatory Visit (HOSPITAL_COMMUNITY): Payer: Self-pay

## 2023-06-30 ENCOUNTER — Emergency Department (HOSPITAL_BASED_OUTPATIENT_CLINIC_OR_DEPARTMENT_OTHER): Payer: PPO | Admitting: Radiology

## 2023-06-30 DIAGNOSIS — Z794 Long term (current) use of insulin: Secondary | ICD-10-CM | POA: Insufficient documentation

## 2023-06-30 DIAGNOSIS — Z20822 Contact with and (suspected) exposure to covid-19: Secondary | ICD-10-CM | POA: Insufficient documentation

## 2023-06-30 DIAGNOSIS — Z7982 Long term (current) use of aspirin: Secondary | ICD-10-CM | POA: Insufficient documentation

## 2023-06-30 DIAGNOSIS — R0602 Shortness of breath: Secondary | ICD-10-CM | POA: Insufficient documentation

## 2023-06-30 LAB — BASIC METABOLIC PANEL
Anion gap: 12 (ref 5–15)
BUN: 23 mg/dL — ABNORMAL HIGH (ref 6–20)
CO2: 27 mmol/L (ref 22–32)
Calcium: 10.1 mg/dL (ref 8.9–10.3)
Chloride: 103 mmol/L (ref 98–111)
Creatinine, Ser: 1.08 mg/dL — ABNORMAL HIGH (ref 0.44–1.00)
GFR, Estimated: 60 mL/min — ABNORMAL LOW (ref >60)
Glucose, Bld: 127 mg/dL — ABNORMAL HIGH (ref 70–99)
Potassium: 3.4 mmol/L — ABNORMAL LOW (ref 3.5–5.1)
Sodium: 142 mmol/L (ref 135–145)

## 2023-06-30 LAB — CBC
HCT: 39 % (ref 36.0–46.0)
Hemoglobin: 12.8 g/dL (ref 12.0–15.0)
MCH: 29.2 pg (ref 26.0–34.0)
MCHC: 32.8 g/dL (ref 30.0–36.0)
MCV: 89 fL (ref 80.0–100.0)
Platelets: 245 10*3/uL (ref 150–400)
RBC: 4.38 MIL/uL (ref 3.87–5.11)
RDW: 12.5 % (ref 11.5–15.5)
WBC: 6.2 10*3/uL (ref 4.0–10.5)
nRBC: 0 % (ref 0.0–0.2)

## 2023-06-30 LAB — RESP PANEL BY RT-PCR (RSV, FLU A&B, COVID)  RVPGX2
Influenza A by PCR: NEGATIVE
Influenza B by PCR: NEGATIVE
Resp Syncytial Virus by PCR: NEGATIVE
SARS Coronavirus 2 by RT PCR: NEGATIVE

## 2023-06-30 LAB — D-DIMER, QUANTITATIVE: D-Dimer, Quant: 0.3 ug{FEU}/mL (ref 0.00–0.50)

## 2023-06-30 LAB — TROPONIN I (HIGH SENSITIVITY): Troponin I (High Sensitivity): 5 ng/L (ref <18)

## 2023-06-30 MED ORDER — EASY TOUCH ALCOHOL PREP MEDIUM 70 % PADS
MEDICATED_PAD | 3 refills | Status: AC
Start: 2023-06-30 — End: ?
  Filled 2023-06-30: qty 400, 90d supply, fill #0
  Filled 2023-08-18 – 2023-08-19 (×2): qty 400, 100d supply, fill #0
  Filled 2023-08-20 – 2023-09-01 (×2): qty 400, fill #0
  Filled 2023-09-02 – 2023-09-24 (×2): qty 400, 100d supply, fill #0
  Filled 2024-01-14: qty 400, 100d supply, fill #1
  Filled 2024-04-16: qty 400, 100d supply, fill #2

## 2023-06-30 NOTE — Discharge Instructions (Signed)
 Screening test for blood clot in the lungs or legs was normal the D-dimer was 0.3.  Chest x-ray also normal.  Follow back up with your primary care doctor.

## 2023-06-30 NOTE — ED Provider Notes (Addendum)
 [facility] EMERGENCY DEPARTMENT AT [facility] Provider Note   CSN: [id number] Arrival date & time: 06/30/23  1936     History  Chief Complaint  Patient presents with   Shortness of Breath    Tina Horton is a 58 y.o. female.  Patient sent in from [place].  Patient's smart watch said that her oxygen levels were 86% at 1 point when she was sleeping.  Patient is had some mild flulike symptoms since Tuesday.  But no significant cough or anything like that patient oxygen levels apparently were in the 90s at [facility] and here they have been 98% or higher.  Respiratory rate 20.  Heart rate 85.  Temp a little borderline at 99 blood pressure 194/82.  Patient denies any chest pain.  Patient denies any particular symptoms currently.  But the whole concern had been about possible hypoxia and [facility] was worried about possible blood clot in the lungs.  Patient legally blind.  Patient past history of known pneumonia.  Patient is never used tobacco products.  Patient is never had any blood clots in the past.  Patient is not on any blood thinners but is on aspirin  a day.  So past medical history also significant for hypertension myopia gastroesophageal reflux disease hyperlipidemia diabetes without complications.  Past surgical history significant for Lasix .  Patient's had a lap scopic vaginal hysterectomy.  Again never used tobacco products.       Home Medications Prior to Admission medications   Medication Sig Start Date End Date Taking? Authorizing Provider  Alcohol  Swabs  (B-D SINGLE USE SWABS  REGULAR) PADS USE AS DIRECTED WHEN TESTING BLOOD SUGAR 10/30/20   Tysinger, Christiane Cowing, PA-C  Alcohol  Swabs  (EASY TOUCH ALCOHOL  PREP MEDIUM) 70 % PADS Use as directed 4 (four) times daily. 06/30/23     aspirin  EC 81 MG tablet Take 1 tablet (81 mg total) by mouth daily. 11/30/19   Tysinger, Christiane Cowing, PA-C  aspirin  EC 81 MG tablet Take 1 tablet (81 mg total) by mouth daily. 04/01/23     atorvastatin   (LIPITOR) 40 MG tablet TAKE 1 TABLET EVERY DAY 05/16/23   Avanell Leigh, MD  Blood Glucose Monitoring Suppl (PRODIGY AUTOCODE BLOOD GLUCOSE) w/Device KIT  12/07/19   [provider]  brimonidine  (ALPHAGAN ) 0.2 % ophthalmic solution Place 1 drop into both eyes 3 (three) times daily. 11/15/22     carvedilol  (COREG ) 25 MG tablet Take 1 tablet (25 mg total) by mouth 2 (two) times daily. 04/28/23   Avanell Leigh, MD  cloNIDine  (CATAPRES ) 0.2 MG tablet TAKE 1 TABLET TWICE DAILY 03/20/23   Avanell Leigh, MD  dorzolamide -timolol  (COSOPT ) 2-0.5 % ophthalmic solution Place 1 drop into both eyes 2 (two) times daily. 03/24/23     glucose blood (PRODIGY NO CODING BLOOD GLUC) test strip Use as instructed 11/05/17   Tysinger, Christiane Cowing, PA-C  glucose blood test strip Use to test blood sugar 3 (three) times daily. 05/09/23     insulin  aspart (NOVOLOG ) 100 UNIT/ML FlexPen Inject 10 Units into the skin 3 (three) times daily with meals. 11/15/22     insulin  degludec (TRESIBA ) 100 UNIT/ML FlexTouch Pen Inject 12 Units into the skin daily. Can riase up to 30 units daily. 11/15/22     Insulin  Degludec-Liraglutide (XULTOPHY Tripp) Inject 10 Units into the skin every morning.    [provider]  Insulin  Pen Needle 32G X 4 MM MISC Use as directed 4 (four) times daily. 05/19/23  latanoprost  (XALATAN ) 0.005 % ophthalmic solution Place 1 drop into both eyes at bedtime.    [provider]  losartan  (COZAAR ) 100 MG tablet Take 1 tablet (100 mg total) by mouth daily. 01/21/23   Avanell Leigh, MD  NOVOFINE PLUS PEN NEEDLE 32G X 4 MM MISC USE 4 TIMES A DAY WITH INSULIN  INJECTIONS 08/21/20   Tysinger, Christiane Cowing, PA-C  NOVOLOG  FLEXPEN 100 UNIT/ML FlexPen INJECT 20 UNITS SUBCUTANEOUSLY THREE TIMES DAILY WITH MEALS (USE SLIDING SCALE PROVIDED BY MD)(PEN EXPIRES AT 28 DAYS) Patient not taking: Reported on 04/30/2022 03/31/20   Claudene Crystal, PA-C  PRODIGY NO CODING BLOOD GLUC test strip TEST BLOOD SUGAR  THREE TIMES DAILY 10/23/20   Tysinger, Christiane Cowing, PA-C  Prodigy Twist Top Lancets 28G MISC USE TO TEST BLOOD SUGAR THREE TIMES DAILY 10/23/20   Tysinger, Christiane Cowing, PA-C  Lancets [facility] DELICA PLUS [place]) MISC Use as directed 3 (three) times daily. 05/09/23         Allergies    Amlodipine , Metformin  and related, Tanzeum  [albiglutide ], and Lisinopril     Review of Systems   Review of Systems  Constitutional:  Negative for chills and fever.  HENT:  Positive for congestion. Negative for ear pain and sore throat.   Eyes:  Negative for pain and visual disturbance.  Respiratory:  Positive for shortness of breath. Negative for cough.   Cardiovascular:  Negative for chest pain, palpitations and leg swelling.  Gastrointestinal:  Negative for abdominal pain and vomiting.  Genitourinary:  Negative for dysuria and hematuria.  Musculoskeletal:  Negative for arthralgias and back pain.  Skin:  Negative for color change and rash.  Neurological:  Negative for seizures and syncope.  All other systems reviewed and are negative.   Physical Exam Updated Vital Signs BP (!) 162/69 (BP Location: Left Arm)   Pulse 69   Temp 98.7 F (37.1 C) (Oral)   Resp 18   LMP 09/13/2015   SpO2 99%  Physical Exam Vitals and nursing note reviewed.  Constitutional:      General: She is not in acute distress.    Appearance: Normal appearance. She is well-developed.  HENT:     Head: Normocephalic and atraumatic.  Eyes:     Extraocular Movements: Extraocular movements intact.     Conjunctiva/sclera: Conjunctivae normal.     Pupils: Pupils are equal, round, and reactive to light.  Cardiovascular:     Rate and Rhythm: Normal rate and regular rhythm.     Heart sounds: No murmur heard. Pulmonary:     Effort: Pulmonary effort is normal. No respiratory distress.     Breath sounds: No stridor. No wheezing, rhonchi or rales.  Chest:     Chest wall: No tenderness.  Abdominal:     Palpations: Abdomen is soft.      Tenderness: There is no abdominal tenderness.  Musculoskeletal:        General: No swelling.     Cervical back: Normal range of motion and neck supple.     Right lower leg: No edema.     Left lower leg: No edema.  Skin:    General: Skin is warm and dry.     Capillary Refill: Capillary refill takes less than 2 seconds.  Neurological:     Mental Status: She is alert.  Psychiatric:        Mood and Affect: Mood normal.     ED Results / Procedures / Treatments   Labs (all labs ordered are listed,  but only abnormal results are displayed) Labs Reviewed  BASIC METABOLIC PANEL - Abnormal; Notable for the following components:      Result Value   Potassium 3.4 (*)    Glucose, Bld 127 (*)    BUN 23 (*)    Creatinine, Ser 1.08 (*)    GFR, Estimated 60 (*)    All other components within normal limits  RESP PANEL BY RT-PCR (RSV, FLU A&B, COVID)  [id number]  CBC  D-DIMER, QUANTITATIVE  TROPONIN I (HIGH SENSITIVITY)  TROPONIN I (HIGH SENSITIVITY)    EKG EKG Interpretation Date/Time:  Monday June 30 2023 20:15:26 EST Ventricular Rate:  76 PR Interval:  168 QRS Duration:  70 QT Interval:  364 QTC Calculation: 409 R Axis:   36  Text Interpretation: Normal sinus rhythm Anterior infarct , age undetermined Abnormal ECG When compared with ECG of 27-Jul-2020 07:50, PREVIOUS ECG IS PRESENT Confirmed by Babbie Dondlinger [phone]) on 06/30/2023 9:00:33 PM  Radiology DG Chest 2 View Result Date: 06/30/2023 CLINICAL DATA:  Shortness of breath and flu-like symptoms including sneezing and congestion EXAM: CHEST - 2 VIEW COMPARISON:  02/01/2021 FINDINGS: Normal cardiomediastinal silhouette. No focal consolidation, pleural effusion, or pneumothorax. No displaced rib fractures. IMPRESSION: No acute cardiopulmonary disease. Electronically Signed   By: Rozell Cornet M.D.   On: 06/30/2023 21:35    Procedures Procedures    Medications Ordered in ED Medications - No data to display  ED Course/  Medical Decision Making/ A&P                                 Medical Decision Making Amount and/or Complexity of Data Reviewed Labs: ordered. Radiology: ordered.   Patient sent in by [facility] with concerns for possible pulmonary embolus as the cause of her smart watch showing low sats.  Sats here at been in the upper 90s.  Not tachycardic.  Chest x-ray negative.  Respiratory panel pending.  CBC no leukocytosis hemoglobin 12.8.  Patient metabolic panel sodium 142 potassium 3.4 GFR is 60.  Creatinine 1.08.  Respiratory panel now back is negative.  Chest x-ray read as no acute cardiopulmonary disease.  Long discussion with patient will do D-dimer.  She is in agreement with that.  Patient nontoxic no acute distress.  Patient's troponin was 5.  Does not need delta troponin patient's D-dimer was normal at 0.3.  No concerns for pulmonary embolus or DVT.  Patient here with very normal pulse ox and heart rate has been normal.  Patient stable for discharge home.  Blood pressure has been elevated here she can follow-up with her primary care doctors regarding that.   Final Clinical Impression(s) / ED Diagnoses Final diagnoses:  Shortness of breath    Rx / DC Orders ED Discharge Orders     None         Nicklas Barns, MD 06/30/23 2155    Nicklas Barns, MD 06/30/23 2324

## 2023-06-30 NOTE — ED Triage Notes (Signed)
 Some SOB started Tuesday Some flu like symptoms (sneezing nasal congestion runny nose) body aches and headache Alerted by smart watch to low spo2% as low as 86%

## 2023-07-07 ENCOUNTER — Other Ambulatory Visit (HOSPITAL_BASED_OUTPATIENT_CLINIC_OR_DEPARTMENT_OTHER): Payer: Self-pay

## 2023-07-11 ENCOUNTER — Other Ambulatory Visit (HOSPITAL_COMMUNITY): Payer: Self-pay

## 2023-07-11 MED ORDER — ONETOUCH VERIO W/DEVICE KIT
PACK | 0 refills | Status: DC
  Filled 2023-07-11: qty 1, 1d supply, fill #0

## 2023-07-11 MED ORDER — FREESTYLE LIBRE 3 READER DEVI
0 refills | Status: AC
  Filled 2023-07-11: qty 1, 1d supply, fill #0

## 2023-07-11 MED ORDER — ONETOUCH VERIO VI STRP
ORAL_STRIP | Freq: Three times a day (TID) | 3 refills | Status: DC
  Filled 2023-07-11: qty 300, 100d supply, fill #0

## 2023-07-11 MED ORDER — FREESTYLE LIBRE 3 PLUS SENSOR MISC
3 refills | Status: AC
  Filled 2023-07-11: qty 6, 90d supply, fill #0
  Filled 2023-10-01: qty 6, 90d supply, fill #1
  Filled 2024-04-03: qty 6, 90d supply, fill #2

## 2023-07-11 MED ORDER — LANCETS 33G MISC
Freq: Three times a day (TID) | 3 refills | Status: AC
  Filled 2023-07-11: qty 300, 100d supply, fill #0
  Filled ????-??-??: fill #1

## 2023-07-14 ENCOUNTER — Other Ambulatory Visit (HOSPITAL_COMMUNITY): Payer: Self-pay

## 2023-07-14 ENCOUNTER — Other Ambulatory Visit: Payer: Self-pay

## 2023-07-17 ENCOUNTER — Other Ambulatory Visit (HOSPITAL_COMMUNITY): Payer: Self-pay

## 2023-07-22 ENCOUNTER — Other Ambulatory Visit (HOSPITAL_COMMUNITY): Payer: Self-pay

## 2023-07-24 ENCOUNTER — Other Ambulatory Visit (HOSPITAL_COMMUNITY): Payer: Self-pay

## 2023-07-25 ENCOUNTER — Other Ambulatory Visit (HOSPITAL_COMMUNITY): Payer: Self-pay

## 2023-07-28 ENCOUNTER — Other Ambulatory Visit (HOSPITAL_COMMUNITY): Payer: Self-pay

## 2023-07-29 ENCOUNTER — Other Ambulatory Visit (HOSPITAL_COMMUNITY): Payer: Self-pay

## 2023-08-16 ENCOUNTER — Other Ambulatory Visit: Payer: Self-pay

## 2023-08-16 ENCOUNTER — Other Ambulatory Visit (HOSPITAL_COMMUNITY): Payer: Self-pay

## 2023-08-16 MED FILL — Carvedilol Tab 25 MG: ORAL | 60 days supply | Qty: 120 | Fill #0 | Status: AC

## 2023-08-16 MED FILL — Losartan Potassium Tab 100 MG: ORAL | 90 days supply | Qty: 90 | Fill #0 | Status: AC

## 2023-08-18 ENCOUNTER — Other Ambulatory Visit: Payer: Self-pay

## 2023-08-18 ENCOUNTER — Other Ambulatory Visit (HOSPITAL_COMMUNITY): Payer: Self-pay

## 2023-08-18 MED ORDER — BRIMONIDINE TARTRATE 0.2 % OP SOLN
1.0000 [drp] | Freq: Three times a day (TID) | OPHTHALMIC | 11 refills | Status: AC
  Filled 2023-08-18: qty 10, 34d supply, fill #0
  Filled 2023-09-15: qty 10, 34d supply, fill #1
  Filled 2023-12-31 (×2): qty 10, 34d supply, fill #2
  Filled 2024-01-28: qty 10, 34d supply, fill #3
  Filled 2024-03-02: qty 10, 34d supply, fill #4
  Filled 2024-04-05: qty 10, 34d supply, fill #5
  Filled 2024-05-03: qty 10, 34d supply, fill #6
  Filled 2024-06-06: qty 10, 34d supply, fill #7

## 2023-08-19 ENCOUNTER — Other Ambulatory Visit: Payer: Self-pay

## 2023-08-20 ENCOUNTER — Other Ambulatory Visit (HOSPITAL_COMMUNITY): Payer: Self-pay

## 2023-08-23 ENCOUNTER — Other Ambulatory Visit (HOSPITAL_COMMUNITY): Payer: Self-pay

## 2023-08-26 DIAGNOSIS — E785 Hyperlipidemia, unspecified: Secondary | ICD-10-CM | POA: Diagnosis not present

## 2023-08-26 DIAGNOSIS — I10 Essential (primary) hypertension: Secondary | ICD-10-CM | POA: Diagnosis not present

## 2023-08-27 ENCOUNTER — Other Ambulatory Visit (HOSPITAL_COMMUNITY): Payer: Self-pay

## 2023-08-27 MED ORDER — ONETOUCH DELICA PLUS LANCET33G MISC
3 refills | Status: AC
  Filled 2023-08-27: qty 300, 100d supply, fill #0
  Filled 2023-08-28: qty 300, fill #0
  Filled 2023-08-28 – 2023-08-29 (×2): qty 300, 100d supply, fill #0
  Filled 2023-08-29 – 2023-08-30 (×2): qty 300, fill #0
  Filled 2023-09-01 – 2023-09-24 (×2): qty 300, 100d supply, fill #0
  Filled 2023-12-31 (×2): qty 300, 100d supply, fill #1
  Filled 2024-04-04: qty 300, 100d supply, fill #2

## 2023-08-27 MED ORDER — ONETOUCH VERIO FLEX SYSTEM W/DEVICE KIT
PACK | 0 refills | Status: AC
Start: 2023-08-27 — End: ?
  Filled 2023-08-27 – 2023-09-02 (×5): qty 1, 30d supply, fill #0

## 2023-08-27 MED ORDER — ONETOUCH ULTRA VI STRP
ORAL_STRIP | 3 refills | Status: DC
  Filled 2023-08-27 – 2023-08-29 (×4): qty 300, 100d supply, fill #0
  Filled 2023-08-29: qty 300, 300d supply, fill #0
  Filled 2023-09-01 – 2023-09-24 (×2): qty 300, 100d supply, fill #0
  Filled 2023-12-31 (×2): qty 300, 100d supply, fill #1
  Filled 2024-04-04: qty 300, 100d supply, fill #2

## 2023-08-28 ENCOUNTER — Other Ambulatory Visit (HOSPITAL_COMMUNITY): Payer: Self-pay

## 2023-08-28 ENCOUNTER — Other Ambulatory Visit: Payer: Self-pay

## 2023-08-29 ENCOUNTER — Other Ambulatory Visit: Payer: Self-pay

## 2023-08-29 ENCOUNTER — Other Ambulatory Visit (HOSPITAL_COMMUNITY): Payer: Self-pay

## 2023-08-29 ENCOUNTER — Other Ambulatory Visit (HOSPITAL_BASED_OUTPATIENT_CLINIC_OR_DEPARTMENT_OTHER): Payer: Self-pay

## 2023-08-29 MED ORDER — ATORVASTATIN CALCIUM 40 MG PO TABS
40.0000 mg | ORAL_TABLET | Freq: Every day | ORAL | 2 refills | Status: AC
Start: 2023-08-26 — End: ?
  Filled 2023-12-31 – 2024-01-01 (×3): qty 90, 90d supply, fill #0
  Filled 2024-03-24: qty 90, 90d supply, fill #1

## 2023-08-29 MED ORDER — CARVEDILOL 25 MG PO TABS
25.0000 mg | ORAL_TABLET | Freq: Two times a day (BID) | ORAL | 1 refills | Status: AC
  Filled 2023-08-29 – 2024-05-02 (×2): qty 180, 90d supply, fill #0

## 2023-08-29 MED ORDER — LOSARTAN POTASSIUM 100 MG PO TABS
100.0000 mg | ORAL_TABLET | Freq: Every day | ORAL | 1 refills | Status: AC
  Filled 2023-08-29 – 2024-03-12 (×33): qty 90, 90d supply, fill #0
  Filled 2024-06-03: qty 90, 90d supply, fill #1

## 2023-08-30 ENCOUNTER — Other Ambulatory Visit (HOSPITAL_COMMUNITY): Payer: Self-pay

## 2023-09-01 ENCOUNTER — Other Ambulatory Visit: Payer: Self-pay

## 2023-09-01 ENCOUNTER — Other Ambulatory Visit (HOSPITAL_COMMUNITY): Payer: Self-pay

## 2023-09-02 ENCOUNTER — Other Ambulatory Visit: Payer: Self-pay

## 2023-09-02 ENCOUNTER — Other Ambulatory Visit (HOSPITAL_COMMUNITY): Payer: Self-pay

## 2023-09-02 DIAGNOSIS — I152 Hypertension secondary to endocrine disorders: Secondary | ICD-10-CM | POA: Diagnosis not present

## 2023-09-02 DIAGNOSIS — R809 Proteinuria, unspecified: Secondary | ICD-10-CM | POA: Diagnosis not present

## 2023-09-02 DIAGNOSIS — E1129 Type 2 diabetes mellitus with other diabetic kidney complication: Secondary | ICD-10-CM | POA: Diagnosis not present

## 2023-09-02 DIAGNOSIS — E1159 Type 2 diabetes mellitus with other circulatory complications: Secondary | ICD-10-CM | POA: Diagnosis not present

## 2023-09-02 DIAGNOSIS — E1169 Type 2 diabetes mellitus with other specified complication: Secondary | ICD-10-CM | POA: Diagnosis not present

## 2023-09-02 DIAGNOSIS — Z794 Long term (current) use of insulin: Secondary | ICD-10-CM | POA: Diagnosis not present

## 2023-09-02 DIAGNOSIS — E785 Hyperlipidemia, unspecified: Secondary | ICD-10-CM | POA: Diagnosis not present

## 2023-09-03 ENCOUNTER — Other Ambulatory Visit: Payer: Self-pay

## 2023-09-04 ENCOUNTER — Other Ambulatory Visit: Payer: Self-pay

## 2023-09-04 ENCOUNTER — Other Ambulatory Visit (HOSPITAL_COMMUNITY): Payer: Self-pay

## 2023-09-05 ENCOUNTER — Other Ambulatory Visit (HOSPITAL_COMMUNITY): Payer: Self-pay

## 2023-09-05 ENCOUNTER — Other Ambulatory Visit: Payer: Self-pay | Admitting: Cardiovascular Disease

## 2023-09-09 ENCOUNTER — Other Ambulatory Visit: Payer: Self-pay

## 2023-09-09 ENCOUNTER — Other Ambulatory Visit (HOSPITAL_COMMUNITY): Payer: Self-pay

## 2023-09-09 MED ORDER — LOSARTAN POTASSIUM 100 MG PO TABS
100.0000 mg | ORAL_TABLET | Freq: Every day | ORAL | 1 refills | Status: AC
  Filled 2023-09-09: qty 90, 90d supply, fill #0

## 2023-09-09 MED ORDER — CARVEDILOL 25 MG PO TABS
25.0000 mg | ORAL_TABLET | Freq: Two times a day (BID) | ORAL | 1 refills | Status: AC
  Filled 2023-09-09 – 2024-01-01 (×13): qty 180, 90d supply, fill #0
  Filled 2024-03-09 – 2024-03-24 (×2): qty 180, 90d supply, fill #1

## 2023-09-09 MED ORDER — CLONIDINE HCL 0.2 MG PO TABS
0.2000 mg | ORAL_TABLET | Freq: Two times a day (BID) | ORAL | 1 refills | Status: DC
  Filled 2023-09-09: qty 180, 90d supply, fill #0
  Filled 2023-12-31 (×2): qty 180, 90d supply, fill #1

## 2023-09-10 ENCOUNTER — Other Ambulatory Visit (HOSPITAL_COMMUNITY): Payer: Self-pay

## 2023-09-11 ENCOUNTER — Other Ambulatory Visit: Payer: Self-pay

## 2023-09-11 ENCOUNTER — Other Ambulatory Visit (HOSPITAL_COMMUNITY): Payer: Self-pay

## 2023-09-12 ENCOUNTER — Other Ambulatory Visit (HOSPITAL_COMMUNITY): Payer: Self-pay

## 2023-09-12 ENCOUNTER — Other Ambulatory Visit: Payer: Self-pay

## 2023-09-13 ENCOUNTER — Other Ambulatory Visit (HOSPITAL_COMMUNITY): Payer: Self-pay

## 2023-09-15 ENCOUNTER — Other Ambulatory Visit: Payer: Self-pay

## 2023-09-15 ENCOUNTER — Other Ambulatory Visit (HOSPITAL_COMMUNITY): Payer: Self-pay

## 2023-09-16 ENCOUNTER — Other Ambulatory Visit (HOSPITAL_COMMUNITY): Payer: Self-pay

## 2023-09-16 ENCOUNTER — Other Ambulatory Visit: Payer: Self-pay

## 2023-09-17 ENCOUNTER — Other Ambulatory Visit (HOSPITAL_COMMUNITY): Payer: Self-pay

## 2023-09-17 ENCOUNTER — Other Ambulatory Visit: Payer: Self-pay

## 2023-09-18 ENCOUNTER — Other Ambulatory Visit: Payer: Self-pay

## 2023-09-18 ENCOUNTER — Other Ambulatory Visit (HOSPITAL_COMMUNITY): Payer: Self-pay

## 2023-09-19 ENCOUNTER — Other Ambulatory Visit (HOSPITAL_COMMUNITY): Payer: Self-pay

## 2023-09-24 ENCOUNTER — Other Ambulatory Visit: Payer: Self-pay

## 2023-10-01 ENCOUNTER — Other Ambulatory Visit: Payer: Self-pay

## 2023-10-01 ENCOUNTER — Other Ambulatory Visit (HOSPITAL_COMMUNITY): Payer: Self-pay

## 2023-10-01 MED ORDER — FREESTYLE LIBRE 3 PLUS SENSOR MISC
3 refills | Status: AC
  Filled 2023-10-01 – 2023-12-30 (×2): qty 6, 90d supply, fill #0

## 2023-10-01 MED ORDER — NOVOLOG FLEXPEN 100 UNIT/ML ~~LOC~~ SOPN
10.0000 [IU] | PEN_INJECTOR | Freq: Three times a day (TID) | SUBCUTANEOUS | 3 refills | Status: AC
  Filled 2023-10-01: qty 30, 84d supply, fill #0

## 2023-11-24 DIAGNOSIS — D631 Anemia in chronic kidney disease: Secondary | ICD-10-CM | POA: Diagnosis not present

## 2023-11-24 DIAGNOSIS — E559 Vitamin D deficiency, unspecified: Secondary | ICD-10-CM | POA: Diagnosis not present

## 2023-11-24 DIAGNOSIS — I129 Hypertensive chronic kidney disease with stage 1 through stage 4 chronic kidney disease, or unspecified chronic kidney disease: Secondary | ICD-10-CM | POA: Diagnosis not present

## 2023-11-24 DIAGNOSIS — N182 Chronic kidney disease, stage 2 (mild): Secondary | ICD-10-CM | POA: Diagnosis not present

## 2023-11-24 DIAGNOSIS — N1330 Unspecified hydronephrosis: Secondary | ICD-10-CM | POA: Diagnosis not present

## 2023-11-24 DIAGNOSIS — R809 Proteinuria, unspecified: Secondary | ICD-10-CM | POA: Diagnosis not present

## 2023-12-01 DIAGNOSIS — H401134 Primary open-angle glaucoma, bilateral, indeterminate stage: Secondary | ICD-10-CM | POA: Diagnosis not present

## 2023-12-01 DIAGNOSIS — H2513 Age-related nuclear cataract, bilateral: Secondary | ICD-10-CM | POA: Diagnosis not present

## 2023-12-01 DIAGNOSIS — H15833 Staphyloma posticum, bilateral: Secondary | ICD-10-CM | POA: Diagnosis not present

## 2023-12-01 DIAGNOSIS — E119 Type 2 diabetes mellitus without complications: Secondary | ICD-10-CM | POA: Diagnosis not present

## 2023-12-06 ENCOUNTER — Other Ambulatory Visit (HOSPITAL_COMMUNITY): Payer: Self-pay

## 2023-12-08 ENCOUNTER — Other Ambulatory Visit: Payer: Self-pay

## 2023-12-08 ENCOUNTER — Other Ambulatory Visit (HOSPITAL_COMMUNITY): Payer: Self-pay

## 2023-12-08 MED ORDER — TRESIBA FLEXTOUCH 100 UNIT/ML ~~LOC~~ SOPN
30.0000 [IU] | PEN_INJECTOR | Freq: Every day | SUBCUTANEOUS | 3 refills | Status: AC
  Filled 2023-12-08: qty 30, 100d supply, fill #0
  Filled 2024-03-11: qty 30, 100d supply, fill #1
  Filled 2024-06-19: qty 30, 100d supply, fill #2

## 2023-12-10 ENCOUNTER — Other Ambulatory Visit: Payer: Self-pay

## 2023-12-10 ENCOUNTER — Other Ambulatory Visit (HOSPITAL_COMMUNITY): Payer: Self-pay

## 2023-12-10 DIAGNOSIS — E785 Hyperlipidemia, unspecified: Secondary | ICD-10-CM | POA: Diagnosis not present

## 2023-12-10 DIAGNOSIS — E1169 Type 2 diabetes mellitus with other specified complication: Secondary | ICD-10-CM | POA: Diagnosis not present

## 2023-12-10 DIAGNOSIS — I152 Hypertension secondary to endocrine disorders: Secondary | ICD-10-CM | POA: Diagnosis not present

## 2023-12-10 DIAGNOSIS — E1159 Type 2 diabetes mellitus with other circulatory complications: Secondary | ICD-10-CM | POA: Diagnosis not present

## 2023-12-10 DIAGNOSIS — R809 Proteinuria, unspecified: Secondary | ICD-10-CM | POA: Diagnosis not present

## 2023-12-10 DIAGNOSIS — E1129 Type 2 diabetes mellitus with other diabetic kidney complication: Secondary | ICD-10-CM | POA: Diagnosis not present

## 2023-12-10 DIAGNOSIS — Z794 Long term (current) use of insulin: Secondary | ICD-10-CM | POA: Diagnosis not present

## 2023-12-10 MED ORDER — NOVOLOG FLEXPEN 100 UNIT/ML ~~LOC~~ SOPN
8.0000 [IU] | PEN_INJECTOR | Freq: Three times a day (TID) | SUBCUTANEOUS | 3 refills | Status: AC
  Filled 2023-12-10: qty 30, 125d supply, fill #0
  Filled 2023-12-31 – 2024-01-05 (×6): qty 30, fill #0

## 2023-12-10 MED ORDER — TRESIBA FLEXTOUCH 100 UNIT/ML ~~LOC~~ SOPN
30.0000 [IU] | PEN_INJECTOR | Freq: Every day | SUBCUTANEOUS | 3 refills | Status: AC
  Filled 2023-12-10: qty 30, 100d supply, fill #0

## 2023-12-10 MED ORDER — NOVOLOG FLEXPEN 100 UNIT/ML ~~LOC~~ SOPN
30.0000 [IU] | PEN_INJECTOR | Freq: Every day | SUBCUTANEOUS | 3 refills | Status: AC
  Filled 2023-12-10: qty 30, 100d supply, fill #0
  Filled 2024-03-12: qty 30, 100d supply, fill #1
  Filled 2024-06-20: qty 30, 100d supply, fill #2

## 2023-12-30 ENCOUNTER — Other Ambulatory Visit: Payer: Self-pay

## 2023-12-31 ENCOUNTER — Other Ambulatory Visit: Payer: Self-pay

## 2023-12-31 ENCOUNTER — Other Ambulatory Visit (HOSPITAL_COMMUNITY): Payer: Self-pay

## 2023-12-31 MED FILL — Losartan Potassium Tab 100 MG: ORAL | 90 days supply | Qty: 90 | Fill #1 | Status: AC

## 2024-01-01 ENCOUNTER — Other Ambulatory Visit (HOSPITAL_COMMUNITY): Payer: Self-pay

## 2024-01-01 ENCOUNTER — Other Ambulatory Visit: Payer: Self-pay

## 2024-01-02 ENCOUNTER — Other Ambulatory Visit: Payer: Self-pay

## 2024-01-03 ENCOUNTER — Other Ambulatory Visit (HOSPITAL_COMMUNITY): Payer: Self-pay

## 2024-01-05 ENCOUNTER — Other Ambulatory Visit (HOSPITAL_COMMUNITY): Payer: Self-pay

## 2024-01-14 ENCOUNTER — Other Ambulatory Visit: Payer: Self-pay

## 2024-01-14 ENCOUNTER — Other Ambulatory Visit (HOSPITAL_COMMUNITY): Payer: Self-pay

## 2024-01-14 MED ORDER — LATANOPROST 0.005 % OP SOLN
1.0000 [drp] | Freq: Every day | OPHTHALMIC | 11 refills | Status: AC
  Filled 2024-01-14: qty 2.5, 25d supply, fill #0
  Filled 2024-02-01: qty 2.5, 25d supply, fill #1
  Filled 2024-02-26: qty 2.5, 25d supply, fill #2
  Filled 2024-03-22: qty 2.5, 25d supply, fill #3
  Filled 2024-04-11: qty 2.5, 25d supply, fill #4
  Filled 2024-05-04: qty 2.5, 25d supply, fill #5
  Filled 2024-05-29: qty 2.5, 25d supply, fill #6
  Filled 2024-06-19: qty 2.5, 25d supply, fill #7

## 2024-01-15 ENCOUNTER — Other Ambulatory Visit (HOSPITAL_COMMUNITY): Payer: Self-pay

## 2024-01-15 ENCOUNTER — Other Ambulatory Visit: Payer: Self-pay

## 2024-01-16 ENCOUNTER — Other Ambulatory Visit: Payer: Self-pay

## 2024-01-16 ENCOUNTER — Other Ambulatory Visit (HOSPITAL_COMMUNITY): Payer: Self-pay

## 2024-01-17 ENCOUNTER — Other Ambulatory Visit (HOSPITAL_COMMUNITY): Payer: Self-pay

## 2024-01-19 ENCOUNTER — Other Ambulatory Visit (HOSPITAL_COMMUNITY): Payer: Self-pay

## 2024-01-20 ENCOUNTER — Other Ambulatory Visit (HOSPITAL_COMMUNITY): Payer: Self-pay

## 2024-01-21 ENCOUNTER — Other Ambulatory Visit (HOSPITAL_COMMUNITY): Payer: Self-pay

## 2024-01-22 ENCOUNTER — Other Ambulatory Visit (HOSPITAL_COMMUNITY): Payer: Self-pay

## 2024-01-23 ENCOUNTER — Other Ambulatory Visit (HOSPITAL_COMMUNITY): Payer: Self-pay

## 2024-01-24 ENCOUNTER — Other Ambulatory Visit (HOSPITAL_COMMUNITY): Payer: Self-pay

## 2024-01-26 ENCOUNTER — Other Ambulatory Visit: Payer: Self-pay

## 2024-01-26 ENCOUNTER — Other Ambulatory Visit (HOSPITAL_COMMUNITY): Payer: Self-pay

## 2024-01-27 ENCOUNTER — Other Ambulatory Visit (HOSPITAL_COMMUNITY): Payer: Self-pay

## 2024-01-28 ENCOUNTER — Other Ambulatory Visit (HOSPITAL_COMMUNITY): Payer: Self-pay

## 2024-01-28 ENCOUNTER — Other Ambulatory Visit: Payer: Self-pay

## 2024-01-29 ENCOUNTER — Other Ambulatory Visit: Payer: Self-pay

## 2024-01-29 ENCOUNTER — Other Ambulatory Visit (HOSPITAL_COMMUNITY): Payer: Self-pay

## 2024-01-30 ENCOUNTER — Other Ambulatory Visit (HOSPITAL_COMMUNITY): Payer: Self-pay

## 2024-01-31 ENCOUNTER — Other Ambulatory Visit (HOSPITAL_COMMUNITY): Payer: Self-pay

## 2024-02-02 ENCOUNTER — Other Ambulatory Visit: Payer: Self-pay

## 2024-02-02 ENCOUNTER — Other Ambulatory Visit (HOSPITAL_COMMUNITY): Payer: Self-pay

## 2024-02-03 ENCOUNTER — Other Ambulatory Visit: Payer: Self-pay

## 2024-02-03 ENCOUNTER — Other Ambulatory Visit (HOSPITAL_COMMUNITY): Payer: Self-pay

## 2024-02-04 ENCOUNTER — Other Ambulatory Visit (HOSPITAL_COMMUNITY): Payer: Self-pay

## 2024-02-05 ENCOUNTER — Other Ambulatory Visit (HOSPITAL_COMMUNITY): Payer: Self-pay

## 2024-02-06 ENCOUNTER — Other Ambulatory Visit (HOSPITAL_COMMUNITY): Payer: Self-pay

## 2024-02-07 ENCOUNTER — Other Ambulatory Visit (HOSPITAL_COMMUNITY): Payer: Self-pay

## 2024-02-08 ENCOUNTER — Other Ambulatory Visit (HOSPITAL_COMMUNITY): Payer: Self-pay

## 2024-02-09 ENCOUNTER — Other Ambulatory Visit (HOSPITAL_COMMUNITY): Payer: Self-pay

## 2024-02-09 DIAGNOSIS — Z1331 Encounter for screening for depression: Secondary | ICD-10-CM | POA: Diagnosis not present

## 2024-02-09 DIAGNOSIS — H548 Legal blindness, as defined in USA: Secondary | ICD-10-CM | POA: Diagnosis not present

## 2024-02-09 DIAGNOSIS — E785 Hyperlipidemia, unspecified: Secondary | ICD-10-CM | POA: Diagnosis not present

## 2024-02-09 DIAGNOSIS — I10 Essential (primary) hypertension: Secondary | ICD-10-CM | POA: Diagnosis not present

## 2024-02-09 DIAGNOSIS — H409 Unspecified glaucoma: Secondary | ICD-10-CM | POA: Diagnosis not present

## 2024-02-09 DIAGNOSIS — I69354 Hemiplegia and hemiparesis following cerebral infarction affecting left non-dominant side: Secondary | ICD-10-CM | POA: Diagnosis not present

## 2024-02-09 DIAGNOSIS — Z0000 Encounter for general adult medical examination without abnormal findings: Secondary | ICD-10-CM | POA: Diagnosis not present

## 2024-02-09 DIAGNOSIS — E1165 Type 2 diabetes mellitus with hyperglycemia: Secondary | ICD-10-CM | POA: Diagnosis not present

## 2024-02-09 MED ORDER — CARVEDILOL 25 MG PO TABS
25.0000 mg | ORAL_TABLET | Freq: Two times a day (BID) | ORAL | 1 refills | Status: AC
  Filled 2024-02-09: qty 180, 90d supply, fill #0

## 2024-02-09 MED ORDER — LOSARTAN POTASSIUM 100 MG PO TABS
100.0000 mg | ORAL_TABLET | Freq: Every day | ORAL | 1 refills | Status: AC
  Filled 2024-02-09: qty 90, 90d supply, fill #0

## 2024-02-09 MED ORDER — ATORVASTATIN CALCIUM 40 MG PO TABS
40.0000 mg | ORAL_TABLET | Freq: Every day | ORAL | 1 refills | Status: AC
  Filled 2024-02-09 – 2024-06-22 (×2): qty 90, 90d supply, fill #0

## 2024-02-09 MED ORDER — CLONIDINE HCL 0.2 MG PO TABS
0.2000 mg | ORAL_TABLET | Freq: Two times a day (BID) | ORAL | 1 refills | Status: AC
  Filled 2024-02-09 – 2024-03-24 (×2): qty 180, 90d supply, fill #0
  Filled 2024-06-22: qty 180, 90d supply, fill #1

## 2024-02-10 ENCOUNTER — Other Ambulatory Visit (HOSPITAL_COMMUNITY): Payer: Self-pay

## 2024-02-10 MED FILL — Carvedilol Tab 25 MG: ORAL | 60 days supply | Qty: 120 | Fill #1 | Status: CN

## 2024-02-11 ENCOUNTER — Other Ambulatory Visit (HOSPITAL_COMMUNITY): Payer: Self-pay

## 2024-02-11 MED FILL — Carvedilol Tab 25 MG: ORAL | 60 days supply | Qty: 120 | Fill #1 | Status: CN

## 2024-02-12 ENCOUNTER — Other Ambulatory Visit (HOSPITAL_COMMUNITY): Payer: Self-pay

## 2024-02-12 MED FILL — Carvedilol Tab 25 MG: ORAL | 60 days supply | Qty: 120 | Fill #1 | Status: CN

## 2024-02-13 ENCOUNTER — Other Ambulatory Visit (HOSPITAL_COMMUNITY): Payer: Self-pay

## 2024-02-13 MED FILL — Carvedilol Tab 25 MG: ORAL | 60 days supply | Qty: 120 | Fill #1 | Status: CN

## 2024-02-14 ENCOUNTER — Other Ambulatory Visit (HOSPITAL_COMMUNITY): Payer: Self-pay

## 2024-02-14 MED FILL — Carvedilol Tab 25 MG: ORAL | 60 days supply | Qty: 120 | Fill #1 | Status: CN

## 2024-02-15 MED FILL — Carvedilol Tab 25 MG: ORAL | 60 days supply | Qty: 120 | Fill #1 | Status: CN

## 2024-02-18 ENCOUNTER — Other Ambulatory Visit (HOSPITAL_COMMUNITY): Payer: Self-pay

## 2024-02-19 ENCOUNTER — Other Ambulatory Visit (HOSPITAL_COMMUNITY): Payer: Self-pay

## 2024-02-19 MED FILL — Carvedilol Tab 25 MG: ORAL | 60 days supply | Qty: 120 | Fill #1 | Status: CN

## 2024-02-20 MED FILL — Carvedilol Tab 25 MG: ORAL | 60 days supply | Qty: 120 | Fill #1 | Status: CN

## 2024-02-25 ENCOUNTER — Other Ambulatory Visit (HOSPITAL_COMMUNITY): Payer: Self-pay

## 2024-02-25 ENCOUNTER — Other Ambulatory Visit: Payer: Self-pay

## 2024-02-25 MED ORDER — NEOMYCIN-POLYMYXIN-HC 3.5-10000-1 OT SOLN
4.0000 [drp] | Freq: Four times a day (QID) | OTIC | 0 refills | Status: AC
  Filled 2024-02-25: qty 10, 13d supply, fill #0

## 2024-02-26 ENCOUNTER — Other Ambulatory Visit (HOSPITAL_COMMUNITY): Payer: Self-pay

## 2024-02-27 ENCOUNTER — Other Ambulatory Visit (HOSPITAL_COMMUNITY): Payer: Self-pay

## 2024-02-27 MED FILL — Carvedilol Tab 25 MG: ORAL | 60 days supply | Qty: 120 | Fill #1 | Status: CN

## 2024-02-28 MED FILL — Carvedilol Tab 25 MG: ORAL | 60 days supply | Qty: 120 | Fill #1 | Status: CN

## 2024-03-01 ENCOUNTER — Other Ambulatory Visit (HOSPITAL_COMMUNITY): Payer: Self-pay

## 2024-03-02 ENCOUNTER — Other Ambulatory Visit: Payer: Self-pay

## 2024-03-02 ENCOUNTER — Other Ambulatory Visit (HOSPITAL_COMMUNITY): Payer: Self-pay

## 2024-03-02 MED FILL — Carvedilol Tab 25 MG: ORAL | 60 days supply | Qty: 120 | Fill #1 | Status: CN

## 2024-03-03 ENCOUNTER — Other Ambulatory Visit (HOSPITAL_COMMUNITY): Payer: Self-pay

## 2024-03-03 MED FILL — Carvedilol Tab 25 MG: ORAL | 60 days supply | Qty: 120 | Fill #1 | Status: CN

## 2024-03-04 ENCOUNTER — Other Ambulatory Visit (HOSPITAL_COMMUNITY): Payer: Self-pay

## 2024-03-04 MED FILL — Carvedilol Tab 25 MG: ORAL | 60 days supply | Qty: 120 | Fill #1 | Status: CN

## 2024-03-05 ENCOUNTER — Other Ambulatory Visit (HOSPITAL_COMMUNITY): Payer: Self-pay

## 2024-03-05 MED FILL — Carvedilol Tab 25 MG: ORAL | 60 days supply | Qty: 120 | Fill #1 | Status: CN

## 2024-03-06 ENCOUNTER — Other Ambulatory Visit (HOSPITAL_COMMUNITY): Payer: Self-pay

## 2024-03-06 MED FILL — Carvedilol Tab 25 MG: ORAL | 60 days supply | Qty: 120 | Fill #1 | Status: CN

## 2024-03-07 ENCOUNTER — Other Ambulatory Visit (HOSPITAL_COMMUNITY): Payer: Self-pay

## 2024-03-07 MED FILL — Carvedilol Tab 25 MG: ORAL | 60 days supply | Qty: 120 | Fill #1 | Status: CN

## 2024-03-09 ENCOUNTER — Other Ambulatory Visit (HOSPITAL_COMMUNITY): Payer: Self-pay

## 2024-03-09 ENCOUNTER — Other Ambulatory Visit: Payer: Self-pay

## 2024-03-09 MED FILL — Carvedilol Tab 25 MG: ORAL | 60 days supply | Qty: 120 | Fill #1 | Status: AC

## 2024-03-12 ENCOUNTER — Other Ambulatory Visit: Payer: Self-pay

## 2024-03-24 ENCOUNTER — Other Ambulatory Visit: Payer: Self-pay

## 2024-03-24 ENCOUNTER — Other Ambulatory Visit (HOSPITAL_COMMUNITY): Payer: Self-pay

## 2024-03-24 ENCOUNTER — Other Ambulatory Visit (HOSPITAL_BASED_OUTPATIENT_CLINIC_OR_DEPARTMENT_OTHER): Payer: Self-pay

## 2024-03-26 ENCOUNTER — Other Ambulatory Visit: Payer: Self-pay

## 2024-03-26 ENCOUNTER — Other Ambulatory Visit (HOSPITAL_COMMUNITY): Payer: Self-pay

## 2024-03-26 MED ORDER — ASPIRIN 81 MG PO TBEC
81.0000 mg | DELAYED_RELEASE_TABLET | Freq: Every day | ORAL | 1 refills | Status: AC
  Filled 2024-03-26: qty 90, 90d supply, fill #0
  Filled 2024-06-22: qty 90, 90d supply, fill #1

## 2024-03-28 ENCOUNTER — Other Ambulatory Visit (HOSPITAL_COMMUNITY): Payer: Self-pay

## 2024-03-29 ENCOUNTER — Other Ambulatory Visit: Payer: Self-pay

## 2024-03-29 ENCOUNTER — Other Ambulatory Visit (HOSPITAL_COMMUNITY): Payer: Self-pay

## 2024-03-29 DIAGNOSIS — E119 Type 2 diabetes mellitus without complications: Secondary | ICD-10-CM | POA: Diagnosis not present

## 2024-03-29 DIAGNOSIS — H15833 Staphyloma posticum, bilateral: Secondary | ICD-10-CM | POA: Diagnosis not present

## 2024-03-29 DIAGNOSIS — H401134 Primary open-angle glaucoma, bilateral, indeterminate stage: Secondary | ICD-10-CM | POA: Diagnosis not present

## 2024-03-29 DIAGNOSIS — H2513 Age-related nuclear cataract, bilateral: Secondary | ICD-10-CM | POA: Diagnosis not present

## 2024-03-29 MED ORDER — DORZOLAMIDE HCL-TIMOLOL MAL 2-0.5 % OP SOLN
OPHTHALMIC | 11 refills | Status: AC
  Filled 2024-03-29: qty 20, 50d supply, fill #0
  Filled 2024-05-12: qty 20, 50d supply, fill #1

## 2024-04-05 ENCOUNTER — Other Ambulatory Visit: Payer: Self-pay

## 2024-04-19 ENCOUNTER — Other Ambulatory Visit (HOSPITAL_COMMUNITY): Payer: Self-pay

## 2024-04-22 DIAGNOSIS — M25511 Pain in right shoulder: Secondary | ICD-10-CM | POA: Diagnosis not present

## 2024-05-02 ENCOUNTER — Other Ambulatory Visit (HOSPITAL_COMMUNITY): Payer: Self-pay

## 2024-05-10 ENCOUNTER — Other Ambulatory Visit (HOSPITAL_COMMUNITY): Payer: Self-pay

## 2024-05-25 NOTE — Progress Notes (Signed)
 Tina Horton                                          MRN: [id number]   05/25/2024   The [facility] Quality Team Specialist reviewed this patient medical record for the purposes of chart review for care gap closure. The following were reviewed: chart review for care gap closure-glycemic status assessment.    [facility] Quality Team

## 2024-05-30 ENCOUNTER — Other Ambulatory Visit (HOSPITAL_COMMUNITY): Payer: Self-pay

## 2024-06-22 ENCOUNTER — Other Ambulatory Visit (HOSPITAL_BASED_OUTPATIENT_CLINIC_OR_DEPARTMENT_OTHER): Payer: Self-pay

## 2024-06-22 ENCOUNTER — Other Ambulatory Visit: Payer: Self-pay
